# Patient Record
Sex: Male | Born: 1935 | Race: White | Hispanic: No | Smoking: Former smoker
Health system: Southern US, Community
[De-identification: ages and names within clinical notes are randomized; demographics above are authoritative.]

## PROBLEM LIST (undated history)

## (undated) DIAGNOSIS — H3560 Retinal hemorrhage, unspecified eye: Secondary | ICD-10-CM

## (undated) DIAGNOSIS — R609 Edema, unspecified: Secondary | ICD-10-CM

## (undated) DIAGNOSIS — E785 Hyperlipidemia, unspecified: Secondary | ICD-10-CM

## (undated) DIAGNOSIS — H35379 Puckering of macula, unspecified eye: Secondary | ICD-10-CM

## (undated) DIAGNOSIS — R5381 Other malaise: Secondary | ICD-10-CM

## (undated) DIAGNOSIS — I209 Angina pectoris, unspecified: Secondary | ICD-10-CM

## (undated) DIAGNOSIS — C4491 Basal cell carcinoma of skin, unspecified: Secondary | ICD-10-CM

## (undated) DIAGNOSIS — K219 Gastro-esophageal reflux disease without esophagitis: Secondary | ICD-10-CM

## (undated) DIAGNOSIS — I10 Essential (primary) hypertension: Secondary | ICD-10-CM

## (undated) DIAGNOSIS — K4090 Unilateral inguinal hernia, without obstruction or gangrene, not specified as recurrent: Secondary | ICD-10-CM

## (undated) DIAGNOSIS — W19XXXA Unspecified fall, initial encounter: Secondary | ICD-10-CM

## (undated) DIAGNOSIS — I639 Cerebral infarction, unspecified: Secondary | ICD-10-CM

## (undated) DIAGNOSIS — R531 Weakness: Secondary | ICD-10-CM

## (undated) DIAGNOSIS — R0989 Other specified symptoms and signs involving the circulatory and respiratory systems: Secondary | ICD-10-CM

## (undated) DIAGNOSIS — M8580 Other specified disorders of bone density and structure, unspecified site: Secondary | ICD-10-CM

## (undated) DIAGNOSIS — K5730 Diverticulosis of large intestine without perforation or abscess without bleeding: Secondary | ICD-10-CM

## (undated) DIAGNOSIS — M47812 Spondylosis without myelopathy or radiculopathy, cervical region: Secondary | ICD-10-CM

## (undated) DIAGNOSIS — R269 Unspecified abnormalities of gait and mobility: Secondary | ICD-10-CM

## (undated) DIAGNOSIS — R2689 Other abnormalities of gait and mobility: Secondary | ICD-10-CM

## (undated) DIAGNOSIS — K5790 Diverticulosis of intestine, part unspecified, without perforation or abscess without bleeding: Secondary | ICD-10-CM

## (undated) DIAGNOSIS — N400 Enlarged prostate without lower urinary tract symptoms: Secondary | ICD-10-CM

## (undated) DIAGNOSIS — D126 Benign neoplasm of colon, unspecified: Secondary | ICD-10-CM

## (undated) DIAGNOSIS — H10829 Rosacea conjunctivitis, unspecified eye: Secondary | ICD-10-CM

## (undated) DIAGNOSIS — C439 Malignant melanoma of skin, unspecified: Secondary | ICD-10-CM

## (undated) DIAGNOSIS — Z8673 Personal history of transient ischemic attack (TIA), and cerebral infarction without residual deficits: Secondary | ICD-10-CM

## (undated) DIAGNOSIS — I480 Paroxysmal atrial fibrillation: Secondary | ICD-10-CM

## (undated) DIAGNOSIS — IMO0002 Reserved for concepts with insufficient information to code with codable children: Secondary | ICD-10-CM

## (undated) HISTORY — DX: Hyperlipidemia, unspecified: E78.5

## (undated) HISTORY — DX: Other malaise: R53.81

## (undated) HISTORY — DX: Other specified symptoms and signs involving the circulatory and respiratory systems: R09.89

## (undated) HISTORY — DX: Benign prostatic hyperplasia without lower urinary tract symptoms: N40.0

## (undated) HISTORY — DX: Basal cell carcinoma of skin, unspecified: C44.91

## (undated) HISTORY — DX: Benign neoplasm of colon, unspecified: D12.6

## (undated) HISTORY — PX: OTHER SURGICAL HISTORY: SHX169

## (undated) HISTORY — DX: Paroxysmal atrial fibrillation: I48.0

## (undated) HISTORY — DX: Rosacea conjunctivitis, unspecified eye: H10.829

## (undated) HISTORY — DX: Unspecified abnormalities of gait and mobility: R26.9

## (undated) HISTORY — DX: Puckering of macula, unspecified eye: H35.379

## (undated) HISTORY — DX: Personal history of transient ischemic attack (TIA), and cerebral infarction without residual deficits: Z86.73

## (undated) HISTORY — DX: Other abnormalities of gait and mobility: R26.89

## (undated) HISTORY — DX: Essential (primary) hypertension: I10

## (undated) HISTORY — DX: Reserved for concepts with insufficient information to code with codable children: IMO0002

## (undated) HISTORY — PX: EYE SURGERY: SHX253

## (undated) HISTORY — DX: Spondylosis without myelopathy or radiculopathy, cervical region: M47.812

## (undated) HISTORY — DX: Cerebral infarction, unspecified: I63.9

## (undated) HISTORY — DX: Diverticulosis of large intestine without perforation or abscess without bleeding: K57.30

## (undated) HISTORY — PX: PROSTATE BIOPSY: SHX241

## (undated) HISTORY — DX: Weakness: R53.1

## (undated) HISTORY — DX: Retinal hemorrhage, unspecified eye: H35.60

## (undated) HISTORY — DX: Other specified disorders of bone density and structure, unspecified site: M85.80

## (undated) HISTORY — DX: Edema, unspecified: R60.9

## (undated) HISTORY — DX: Diverticulosis of intestine, part unspecified, without perforation or abscess without bleeding: K57.90

## (undated) HISTORY — DX: Unspecified fall, initial encounter: W19.XXXA

## (undated) HISTORY — DX: Unilateral inguinal hernia, without obstruction or gangrene, not specified as recurrent: K40.90

## (undated) HISTORY — DX: Malignant melanoma of skin, unspecified: C43.9

---
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
## 2002-08-31 ENCOUNTER — Encounter: Payer: Self-pay | Admitting: General Surgery

## 2002-08-31 ENCOUNTER — Ambulatory Visit (HOSPITAL_COMMUNITY): Admission: RE | Admit: 2002-08-31 | Discharge: 2002-08-31 | Payer: Self-pay | Admitting: General Surgery

## 2002-09-04 ENCOUNTER — Encounter: Payer: Self-pay | Admitting: General Surgery

## 2002-09-04 ENCOUNTER — Encounter: Admission: RE | Admit: 2002-09-04 | Discharge: 2002-09-04 | Payer: Self-pay | Admitting: General Surgery

## 2002-09-05 ENCOUNTER — Encounter (INDEPENDENT_AMBULATORY_CARE_PROVIDER_SITE_OTHER): Payer: Self-pay | Admitting: *Deleted

## 2002-09-05 ENCOUNTER — Ambulatory Visit (HOSPITAL_BASED_OUTPATIENT_CLINIC_OR_DEPARTMENT_OTHER): Admission: RE | Admit: 2002-09-05 | Discharge: 2002-09-05 | Payer: Self-pay | Admitting: General Surgery

## 2002-09-05 ENCOUNTER — Encounter: Payer: Self-pay | Admitting: General Surgery

## 2002-09-20 ENCOUNTER — Encounter: Admission: RE | Admit: 2002-09-20 | Discharge: 2002-09-20 | Payer: Self-pay | Admitting: Specialist

## 2002-09-20 ENCOUNTER — Encounter: Payer: Self-pay | Admitting: Specialist

## 2005-06-28 DIAGNOSIS — D126 Benign neoplasm of colon, unspecified: Secondary | ICD-10-CM

## 2005-06-28 HISTORY — DX: Benign neoplasm of colon, unspecified: D12.6

## 2005-08-05 ENCOUNTER — Ambulatory Visit: Payer: Self-pay | Admitting: Gastroenterology

## 2005-08-18 ENCOUNTER — Ambulatory Visit: Payer: Self-pay | Admitting: Gastroenterology

## 2005-08-18 ENCOUNTER — Encounter (INDEPENDENT_AMBULATORY_CARE_PROVIDER_SITE_OTHER): Payer: Self-pay | Admitting: Specialist

## 2005-08-26 DIAGNOSIS — I639 Cerebral infarction, unspecified: Secondary | ICD-10-CM

## 2005-08-26 HISTORY — DX: Cerebral infarction, unspecified: I63.9

## 2005-09-20 ENCOUNTER — Inpatient Hospital Stay (HOSPITAL_COMMUNITY): Admission: EM | Admit: 2005-09-20 | Discharge: 2005-09-24 | Payer: Self-pay | Admitting: Emergency Medicine

## 2005-09-21 ENCOUNTER — Encounter (INDEPENDENT_AMBULATORY_CARE_PROVIDER_SITE_OTHER): Payer: Self-pay | Admitting: Cardiology

## 2005-09-22 ENCOUNTER — Ambulatory Visit: Payer: Self-pay | Admitting: Oncology

## 2005-10-22 ENCOUNTER — Ambulatory Visit (HOSPITAL_COMMUNITY): Admission: RE | Admit: 2005-10-22 | Discharge: 2005-10-22 | Payer: Self-pay | Admitting: Internal Medicine

## 2006-06-28 HISTORY — PX: COLONOSCOPY: SHX174

## 2006-09-27 ENCOUNTER — Ambulatory Visit: Payer: Self-pay | Admitting: Gastroenterology

## 2006-10-05 ENCOUNTER — Ambulatory Visit: Payer: Self-pay | Admitting: Gastroenterology

## 2007-12-19 ENCOUNTER — Encounter: Admission: RE | Admit: 2007-12-19 | Discharge: 2007-12-19 | Payer: Self-pay | Admitting: Internal Medicine

## 2008-10-16 ENCOUNTER — Ambulatory Visit: Payer: Self-pay | Admitting: Vascular Surgery

## 2009-09-03 ENCOUNTER — Encounter (INDEPENDENT_AMBULATORY_CARE_PROVIDER_SITE_OTHER): Payer: Self-pay | Admitting: *Deleted

## 2009-09-11 ENCOUNTER — Encounter (INDEPENDENT_AMBULATORY_CARE_PROVIDER_SITE_OTHER): Payer: Self-pay | Admitting: *Deleted

## 2009-10-14 ENCOUNTER — Encounter (INDEPENDENT_AMBULATORY_CARE_PROVIDER_SITE_OTHER): Payer: Self-pay | Admitting: *Deleted

## 2009-10-14 ENCOUNTER — Ambulatory Visit: Payer: Self-pay | Admitting: Gastroenterology

## 2009-10-14 DIAGNOSIS — I6350 Cerebral infarction due to unspecified occlusion or stenosis of unspecified cerebral artery: Secondary | ICD-10-CM | POA: Insufficient documentation

## 2009-10-14 DIAGNOSIS — Z860100 Personal history of colon polyps, unspecified: Secondary | ICD-10-CM | POA: Insufficient documentation

## 2009-10-23 ENCOUNTER — Telehealth: Payer: Self-pay | Admitting: Gastroenterology

## 2009-10-28 ENCOUNTER — Telehealth (INDEPENDENT_AMBULATORY_CARE_PROVIDER_SITE_OTHER): Payer: Self-pay | Admitting: *Deleted

## 2009-10-29 ENCOUNTER — Ambulatory Visit: Payer: Self-pay | Admitting: Gastroenterology

## 2010-07-14 ENCOUNTER — Other Ambulatory Visit: Payer: Self-pay | Admitting: Dermatology

## 2010-07-28 NOTE — Progress Notes (Signed)
Summary: ? re meds  Phone Note Call from Patient Call back at Home Phone [phone]   Caller: Patient Call For: Jarold Motto Reason for Call: Talk to Nurse Summary of Call: Patient has questions regarding meds before his prep Initial call taken by: Tawni Levy,  October 23, 2009 12:31 PM  Follow-up for Phone Call        Pt asks if it is OK to use miralax during the five days prior to colonoscopy.  Pt instructed that it will be OK to cont this. Follow-up by: Ashok Cordia RN,  October 23, 2009 12:41 PM

## 2010-07-28 NOTE — Letter (Signed)
Summary: Colonoscopy Letter  [facility]  [address] [place], [place] [address]   Phone: [phone]  Fax: [phone]      September 03, 2009 MRN: [id number]   Cody [name] [address] [place], [place]  [address]   Dear [name],   According to your medical record, it is time for you to schedule a Colonoscopy. The American Cancer Society recommends this procedure as a method to detect early colon cancer. Patients with a family history of colon cancer, or a personal history of colon polyps or inflammatory bowel disease are at increased risk.  This letter has beeen generated based on the recommendations made at the time of your procedure. If you feel that in your particular situation this may no longer apply, please contact our office.  Please call our office at [phone] to schedule this appointment or to update your records at your earliest convenience.  Thank you for cooperating with [place] to provide you with the very best care possible.   Sincerely,   Vania Rea. Jarold Motto, M.D.  [facility] Gastroenterology Division [phone]

## 2010-07-28 NOTE — Assessment & Plan Note (Signed)
Summary: SCREEN FOR COLON/ON PLAVIX/YF   History of Present Illness Primary GI MD: Sheryn Bison MD FACP FAGA Chief Complaint: Consult colon, pt is on Plavix. Denies any GI sx at this time. History of Present Illness:   77-year-old Caucasian male with previous CVA and is on chronic Plavix therapy. 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 He currently is asymptomatic in terms of any GI complaints. He has regular bowel movements without melena or hematochezia. His appetite is good and his weight is stable. He is followed by Dr. Jarome Matin  for primary care.He denies any current cardiovascular or pulmonary complaints.   GI Review of Systems      Denies abdominal pain, acid reflux, belching, bloating, chest pain, dysphagia with liquids, dysphagia with solids, heartburn, loss of appetite, nausea, vomiting, vomiting blood, weight loss, and  weight gain.        Denies anal fissure, black tarry stools, change in bowel habit, constipation, diarrhea, diverticulosis, fecal incontinence, heme positive stool, hemorrhoids, irritable bowel syndrome, jaundice, light color stool, liver problems, rectal bleeding, and  rectal pain. Preventive Screening-Counseling & Management  Alcohol-Tobacco     Smoking Status: quit      Drug Use:  no.      Current Medications (verified): 1)  Plavix 75 Mg Tabs (Clopidogrel Bisulfate) .... One Tablet By Mouth Once Daily 2)  Lisinopril 10 Mg Tabs (Lisinopril) .... One Tablet By Mouth Once Daily 3)  Digoxin 0.25 Mg Tabs (Digoxin) .... One Tablet By Mouth Once Daily 4)  Carvedilol 3.125 Mg Tabs (Carvedilol) .... One Tablet By Mouth Two Times A Day 5)  Pravastatin Sodium 20 Mg Tabs (Pravastatin Sodium) .... One Tablet By Mouth Once Daily 6)  Tamsulosin Hcl 0.4 Mg Caps (Tamsulosin Hcl) .... One Tablet By Mouth Once Daily 7)  Avodart 0.5 Mg Caps (Dutasteride) .... One  Capsule By Mouth Once Daily 8)  Hydrochlorothiazide 25 Mg Tabs (Hydrochlorothiazide) .... 1/2 Tablets By Mouth Once Daily  Allergies (verified): 1)  ! Aspirin  Past History:  Past medical, surgical, family and social histories (including risk factors) reviewed for relevance to current acute and chronic problems.  Past Medical History: Arrhythmia Adenomatous Colon Polyps Hypertension Stroke  Past Surgical History: Tonsillectomy  Family History: Reviewed history and no changes required. Family History of Liver Cancer:Father Family History of Pancreatic Cancer:Sister  Social History: Reviewed history and no changes required. Divorced Retired Patient is a former smoker.  Alcohol Use - yes Illicit Drug Use - no Smoking Status:  quit Drug Use:  no  Review of Systems       The patient complains of swelling of feet/legs.  The patient denies allergy/sinus, anemia, anxiety-new, arthritis/joint pain, back pain, blood in urine, breast changes/lumps, change in vision, confusion, cough, coughing up blood, depression-new, fainting, fatigue, fever, headaches-new, hearing problems, heart murmur, heart rhythm changes, itching, menstrual pain, muscle pains/cramps, night sweats, nosebleeds, pregnancy symptoms, shortness of breath, skin rash, sleeping problems, sore throat, swollen lymph glands, thirst - excessive , urination - excessive , urination changes/pain, urine leakage, vision changes, and voice change.    Vital Signs:  Patient profile:   76 year old male Height:      74 inches Weight:      152.50 pounds BMI:     19.65 Pulse rate:   68 / minute Pulse rhythm:   regular BP sitting:   128 / 68  (  left arm) Cuff size:   regular  Vitals Entered By: Christie Nottingham CMA Duncan Dull) (October 14, 2009 2:49 PM)  Physical Exam  General:  Well developed, well nourished, no acute distress.tall statured.   Head:  Normocephalic and atraumatic. Eyes:  PERRLA, no icterus.exam deferred to patient's  ophthalmologist.   Lungs:  Clear throughout to auscultation. Heart:  Regular rate and rhythm; no murmurs, rubs,  or bruits. Abdomen:  Soft, nontender and nondistended. No masses, hepatosplenomegaly or hernias noted. Normal bowel sounds. Msk:  Symmetrical with no gross deformities. Normal posture.Multiple ecchymoses on his upper extremities noted. Extremities:  No clubbing, cyanosis, edema or deformities noted. Neurologic:  Alert and  oriented x4;  grossly normal neurologically. Psych:  Alert and cooperative. Normal mood and affect.   Impression & Recommendations:  Problem # 1:  PERSONAL HX COLONIC POLYPS (ICD-V12.72) Assessment Unchanged Colonoscopy on Plavix therapy scheduled at his convenience. The risk and benefits of increased bleeding associated with Plavix use has been explained and reviewed. I do not think he is a candidate, off of his anticoagulation therapy, and he tolerated his previous polypectomies without difficulty while on Plavix.  Problem # 2:  CVA (ICD-434.91) Assessment: Improved Continue all other medications per primary care.  Patient Instructions: 1)  You are scheuled for a follow up colonoscopy. 2)  You can remain on your Plavix. 3)  The medication list was reviewed and reconciled.  All changed / newly prescribed medications were explained.  A complete medication list was provided to the patient / caregiver. 4)  Copy sent to : Dr. Jarome Matin 5)  Please continue current medications.  6)  Colonoscopy and Flexible Sigmoidoscopy brochure given.  7)  Conscious Sedation brochure given.   Appended Document: SCREEN FOR COLON/ON PLAVIX/YF    Clinical Lists Changes  Medications: Added new medication of MOVIPREP 100 GM  SOLR (PEG-KCL-NACL-NASULF-NA ASC-C) As per prep instructions. - Signed Rx of MOVIPREP 100 GM  SOLR (PEG-KCL-NACL-NASULF-NA ASC-C) As per prep instructions.;  #1 x 0;  Signed;  Entered by: Ashok Cordia RN;  Authorized by: Mardella Layman MD [facility];   Method used: Electronically to [facility]*, [address] [place], [place]  [id number], Ph: [phone], Fax: [phone] Orders: Added new Test order of Colonoscopy (Colon) - Signed    Prescriptions: MOVIPREP 100 GM  SOLR (PEG-KCL-NACL-NASULF-NA ASC-C) As per prep instructions.  #1 x 0   Entered by:   Ashok Cordia RN   Authorized by:   Mardella Layman MD [facility]   Signed by:   Ashok Cordia RN on 10/14/2009   Method used:   Electronically to        [facility]* (retail)       [address]       [place], [place]  [id number]       Ph: [phone]       Fax: [phone]   RxID:   [id number]

## 2010-07-28 NOTE — Letter (Signed)
Summary: New Patient letter  [facility]  [address] [place], [place] [address]   Phone: [phone]  Fax: [phone]       09/11/2009 MRN: [id number]  Cody [name] [address] [place], [place]  [address]  Dear Mr. [facility],  Welcome to the Gastroenterology Division at [facility].    You are scheduled to see Dr.  Jarold Motto on 10-14-09 at 2:45pm on the 3rd floor at [facility], [address]  We ask that you try to arrive at our office 15 minutes prior to your appointment time to allow for check-in.  We would like you to complete the enclosed self-administered evaluation form prior to your visit and bring it with you on the day of your appointment.  We will review it with you.  Also, please bring a complete list of all your medications or, if you prefer, bring the medication bottles and we will list them.  Please bring your insurance card so that we may make a copy of it.  If your insurance requires a referral to see a specialist, please bring your referral form from your primary care physician.  Co-payments are due at the time of your visit and may be paid by cash, check or credit card.     Your office visit will consist of a consult with your physician (includes a physical exam), any laboratory testing he/she may order, scheduling of any necessary diagnostic testing (e.g. x-ray, ultrasound, CT-scan), and scheduling of a procedure (e.g. Endoscopy, Colonoscopy) if required.  Please allow enough time on your schedule to allow for any/all of these possibilities.    If you cannot keep your appointment, please call [phone] to cancel or reschedule prior to your appointment date.  This allows [place] the opportunity to schedule an appointment for another patient in need of care.  If you do not cancel or reschedule by 5 p.m. the business day prior to your appointment date, you will be charged a $50.00 late cancellation/no-show fee.    Thank you for choosing  [facility] for your medical needs.  We appreciate the opportunity to care for you.  Please visit [place] at our website  to learn more about our practice.                     Sincerely,                                                             The Gastroenterology Division

## 2010-07-28 NOTE — Letter (Signed)
Summary: [facility] Instructions  [facility]  [address] [place], [place] [address]   Phone: [phone]  Fax: [phone]       Cody [name]    January 07, 1925    MRN: [id number]        Procedure Day Dorna Bloom:  Wednedsay, 10/29/09     Arrival Time: 12:30      Procedure Time: 1:30     Location of Procedure:                    _X _  [facility] (4th Floor)                        PREPARATION FOR COLONOSCOPY WITH MOVIPREP   Starting 5 days prior to your procedure 10/24/09 do not eat nuts, seeds, popcorn, corn, beans, peas,  salads, or any raw vegetables.  Do not take any fiber supplements (e.g. Metamucil, Citrucel, and Benefiber).  THE DAY BEFORE YOUR PROCEDURE         DATE: 10/28/09   DAY: Tuesday  1.  Drink clear liquids the entire day-NO SOLID FOOD  2.  Do not drink anything colored red or purple.  Avoid juices with pulp.  No orange juice.  3.  Drink at least 64 oz. (8 glasses) of fluid/clear liquids during the day to prevent dehydration and help the prep work efficiently.  CLEAR LIQUIDS INCLUDE: Water Jello Ice Popsicles Tea (sugar ok, no milk/cream) Powdered fruit flavored drinks Coffee (sugar ok, no milk/cream) Gatorade Juice: apple, white grape, white cranberry  Lemonade Clear bullion, consomm, broth Carbonated beverages (any kind) Strained chicken noodle soup Hard Candy                             4.  In the morning, mix first dose of MoviPrep solution:    Empty 1 Pouch A and 1 Pouch B into the disposable container    Add lukewarm drinking water to the top line of the container. Mix to dissolve    Refrigerate (mixed solution should be used within 24 hrs)  5.  Begin drinking the prep at 5:00 p.m. The MoviPrep container is divided by 4 marks.   Every 15 minutes drink the solution down to the next mark (approximately 8 oz) until the full liter is complete.   6.  Follow completed prep with 16 oz of clear liquid of your choice (Nothing red  or purple).  Continue to drink clear liquids until bedtime.  7.  Before going to bed, mix second dose of MoviPrep solution:    Empty 1 Pouch A and 1 Pouch B into the disposable container    Add lukewarm drinking water to the top line of the container. Mix to dissolve    Refrigerate  THE DAY OF YOUR PROCEDURE      DATE: 10/29/09   DAY: Wednesday  Beginning at 8:30 a.m. (5 hours before procedure):         1. Every 15 minutes, drink the solution down to the next mark (approx 8 oz) until the full liter is complete.  2. Follow completed prep with 16 oz. of clear liquid of your choice.    3. You may drink clear liquids until 11:30  (2 HOURS BEFORE PROCEDURE).   MEDICATION INSTRUCTIONS  Unless otherwise instructed, you should take regular prescription medications with a small sip of water   as early as possible  the morning of your procedure.   You May remain on your Plavix.              OTHER INSTRUCTIONS  You will need a responsible adult at least 74 years of age to accompany you and drive you home.   This person must remain in the waiting room during your procedure.  Wear loose fitting clothing that is easily removed.  Leave jewelry and other valuables at home.  However, you may wish to bring a book to read or  an iPod/MP3 player to listen to music as you wait for your procedure to start.  Remove all body piercing jewelry and leave at home.  Total time from sign-in until discharge is approximately 2-3 hours.  You should go home directly after your procedure and rest.  You can resume normal activities the  day after your procedure.  The day of your procedure you should not:   Drive   Make legal decisions   Operate machinery   Drink alcohol   Return to work  You will receive specific instructions about eating, activities and medications before you leave.    The above instructions have been reviewed and explained to me by   _______________________    I  fully understand and can verbalize these instructions _____________________________ Date _________

## 2010-07-28 NOTE — Procedures (Signed)
Summary: Colonoscopy  Patient: Cody [name] Note: All result statuses are Final unless otherwise noted.  Tests: (1) Colonoscopy (COL)   COL Colonoscopy           DONE     [facility]     [address]     [place], [place]  [address]           COLONOSCOPY PROCEDURE REPORT           PATIENT:  [name], [name]  MR#:  [id number]     BIRTHDATE:  [birth year], 84 yrs. old  GENDER:  male     ENDOSCOPIST:  Vania Rea. Jarold Motto, MD, [facility]     REF. BY:     PROCEDURE DATE:  10/29/2009     PROCEDURE:  Surveillance Colonoscopy     ASA CLASS:  Class II     INDICATIONS:  history of polyps     MEDICATIONS:   Fentanyl 25 mcg IV, Versed 3 mg IV           DESCRIPTION OF PROCEDURE:   After the risks benefits and     alternatives of the procedure were thoroughly explained, informed     consent was obtained.  Digital rectal exam was performed and     revealed no abnormalities.   The LB CF-H180AL [id number] endoscope     was introduced through the anus and advanced to the cecum, which     was identified by both the appendix and ileocecal valve, without     limitations.  The quality of the prep was excellent, using     MoviPrep.  The instrument was then slowly withdrawn as the colon     was fully examined.     <<PROCEDUREIMAGES>>           FINDINGS:  Moderate diverticulosis was found in the sigmoid to     descending colon segments.   Retroflexed views in the rectum     revealed no abnormalities.    The scope was then withdrawn from     the patient and the procedure completed.           COMPLICATIONS:  None     ENDOSCOPIC IMPRESSION:     1) Moderate diverticulosis in the sigmoid to descending colon     segments     2) Normal colonoscopy otherwise     3) No polyps or cancers     RECOMMENDATIONS:     1) high fiber diet     no need for routine colonoscopy followup here per age,co-morbid     medical problems, and today's negative exam.     REPEAT EXAM:  No           ______________________________     Vania Rea. Jarold Motto, MD, [facility]           CC:  Jarome Matin, MD           n.     Rosalie Doctor:   Vania Rea. Patterson at 10/29/2009 02:04 PM           Madlyn Frankel, [phone]  Note: An exclamation mark (!) indicates a result that was not dispersed into the flowsheet. Document Creation Date: 10/29/2009 2:05 PM _______________________________________________________________________  (1) Order result status: Final Collection or observation date-time: 10/29/2009 13:55 Requested date-time:  Receipt date-time:  Reported date-time:  Referring Physician:   Ordering Physician: Sheryn Bison [phone]) Specimen Source:  Source: Launa Grill Order Number: [id number] Lab site:   Appended Document: Colonoscopy

## 2010-07-28 NOTE — Progress Notes (Signed)
Summary: Prep question re; meds  Pt. had questions re:  Taking his Carvedilol on an empty stomach.  Referred him to his physician that ordered it for him.

## 2010-07-28 NOTE — Procedures (Signed)
Summary: Colon   Colonoscopy  Procedure date:  10/05/2006  Findings:      Location:  [facility].    Colonoscopy  Procedure date:  10/05/2006  Findings:      Location:  [facility].   Patient Name: Cody [name], Cody [name]. MRN:  Procedure Procedures: Colonoscopy CPT: [phone].    with polypectomy. CPT: [id number].  Personnel: Endoscopist: Vania Rea. Jarold Motto, MD.  Exam Location: Exam performed in Outpatient Clinic. Outpatient  Patient Consent: Procedure, Alternatives, Risks and Benefits discussed, consent obtained, from patient. Consent was obtained by the RN.  Indications  Surveillance of: Adenomatous Polyp(s). Initial polypectomy was performed in 2007. in Feb. 1-2 Polyps were found at Index Exam. Largest polyp removed was 10 to 19 mm. Prior polyp located in distal colon. Pathology of worst  polyp: high-grade dysplasia.  History  Current Medications: Patient is taking an non-steroidal medication. Patient is on an anticoagulant. Patient is not currently taking Coumadin. Antiplatelet: Plavix (Clopidrogel) 75 mg, QAM,  Medical/ Surgical History: TIAs, Hypertension, Benign Prostatic Hypertrophy,  Pre-Exam Physical: Performed Aug 18, 2005. Cardio-pulmonary exam, Rectal exam, Abdominal exam, Extremity exam, Mental status exam WNL.  Comments: Pt. history reviewed/updated, physical exam performed prior to initiation of sedation? yes Exam Exam: Extent of exam reached: Cecum, extent intended: Cecum.  The cecum was identified by appendiceal orifice and IC valve. Patient position: on left side. Time to Cecum: 00:05:26. Time for Withdrawl: 00:09:47. Colon retroflexion performed. Images taken. ASA Classification: II. Tolerance: excellent.  Monitoring: Pulse and BP monitoring, Oximetry used. Supplemental O2 given. at 2 Liters.  Colon Prep Used Golytely for colon prep. Prep results: good.  Sedation Meds: Patient assessed and found to be appropriate for  moderate (conscious) sedation. Fentanyl 50 mcg. given IV. Versed 7 mg. given IV.  Instrument(s): CF 140L. Serial [id number].  Findings - DIVERTICULOSIS: Descending Colon to Sigmoid Colon. Not bleeding. ICD9: Diverticulosis: 562.10.  - POLYP: Descending Colon, Maximum size: 4 mm. sessile polyp. Procedure:  snare with cautery, removed, not retrieved, ICD9: Colon Polyps: 211.3. Comments: No tissue left after cautery applied.  - NORMAL EXAM: Cecum to Rectum. Not Seen: Polyps. AVM's. Colitis. Tumors. Melanosis. Crohn's. Hemorrhoids.   Assessment  Diagnoses: 211.3: Colon Polyps.  562.10: Diverticulosis.   Events  Unplanned Interventions: No intervention was required.  Plans Medication Plan: Referring provider to order medications.  Patient Education: Patient given standard instructions for: Polyps. Diverticulosis. Patient instructed to get routine colonoscopy every 3 years.  Disposition: After procedure patient sent to recovery. After recovery patient sent home.  Scheduling/Referral: Follow-Up prn.    cc; Barry Dienes. Jarold Motto, MD    This report was created from the original endoscopy report, which was reviewed and signed by the above listed endoscopist.

## 2010-11-10 NOTE — Assessment & Plan Note (Signed)
OFFICE VISIT   RANDEL, HARGENS  DOB:  1925-04-28                                       10/16/2008  CHART#:[id number]   HISTORY OF PRESENT ILLNESS:  Mr. Clayson is an 77 year old male referred  by Dr. Brunilda Payor for evaluation of foot and ankle swelling.  The  patient states that for the past year he has noted he has had some  swelling of his feet and ankles bilaterally.  He has no pain associated  with this.  He was wondering whether or not this might have been related  previously to his carvedilol.  He has no prior history of DVT.  He has  no history of varicose veins.  He has no claudication symptoms.   His primary atherosclerotic risk factors include hypertension and  elevated cholesterol as well as age.   PAST SURGICAL HISTORY:  None.   MEDICATIONS:  Digoxin 0.25 mg once a day, lisinopril 10 mg once a day,  Plavix 75 mg once a day, carvedilol 3.125 mg twice a day, pravastatin 20  mg once a day, __________ 0.4 mg once a day, hydrochlorothiazide 25 mg  once a day, vitamin D once a day, calcium once a day, saw palmetto once  a day, fluorouracil  5% cream.   He has no known drug allergies.   FAMILY HISTORY:  Unremarkable.   SOCIAL HISTORY:  He is divorced, has 2 children.  He is a retired  Medical illustrator.  He is a former smoker but quit 1975.  He drinks 3-4 ounces of  alcohol daily.   REVIEW OF SYSTEMS:  CARDIAC:  He thinks he may have had a heart murmur  in the past and may have a history of an irregular heartbeat.  He has  urinary frequency.  VASCULAR:  Had a TIA several years ago.  PULMONARY, GI, NEUROLOGIC, ORTHOPEDIC, PSYCHIATRIC, ENT AND HEMATOLOGIC  review of systems are all negative.   PHYSICAL EXAMINATION:  Blood pressure is 175/75 in the right arm, pulse  is 63 and regular.  HEENT:  Unremarkable.  Neck has 2+ carotid pulses  without bruit.  Chest is clear to auscultation.  Cardiac exam is regular  rate rhythm without murmur.  Abdomen is soft,  nontender, nondistended.  No masses.  Extremities:  He has 2+ radial, 2+ femoral pulses  bilaterally.  He has 1+ popliteal pulses bilaterally.  He has a 1+ right  dorsalis pedis pulse.  He has a 1+ left posterior tibial pulse. He has  trace edema of the ankle and feet bilaterally.   He had bilateral ABIs performed today which were 1.32 on the right, 1.81  on the left.  He had biphasic waveforms bilaterally.  Toe pressure was  99 on the right and 100 on the left.   I had a lengthy discussion with Mr. Georg today about the differential  diagnosis of leg swelling.  He does not seem to have any known history  of liver or renal disease.  I believe these would be less likely.  He  may have some mild venous insufficiency but this is not significant  overall in his pattern of swelling which is continuous rather than the  end of the day and with standing, is not really consistent with venous  disease.  I believe he probably has some component of mild cardiac  dysfunction  which may be contributing to this.  Options for him would  include compression stockings, but he did not feel his symptoms are  severe enough to require these at this point.  He mainly wanted  reassurance that something serious is not going on.  I encouraged him to  continue to follow up with Dr. Jarold Motto for treatment of his cardiac  dysfunction.  He did wonder whether or not his medications may have  caused the swelling.  I reassured him that since the swelling is fairly  mild in nature that I would not consider this a major side effects from  medications but that the medications he is taking are also sometimes  prescribed for congestive failure which can produce ankle and foot  swelling.  He will follow up on an as-needed basis.   Janetta Hora. Fields, MD  Electronically Signed   CEF/MEDQ  D:  10/16/2008  T:  10/17/2008  Job:  [id number]

## 2010-11-13 NOTE — Assessment & Plan Note (Signed)
 HEALTHCARE                         GASTROENTEROLOGY OFFICE NOTE   JABRIL, PURSELL                        MRN:          [id number]  DATE:09/27/2006                            DOB:          10-16-1924    Mr. Cody [name] is a 77 year old white male who had colonoscopy and  polypectomy 1 year ago.  He has carcinoma in situ and a 15 mm polyp  removed from the mid sigmoid colon.  I recommended that he have followup  colonoscopy exam.   1. Since he was last seen, he apparently has had a TIA and is on      Plavix 75 mg a day.  2. Along with Lanoxin 0.25 mg a day.  3. Metoprolol daily.  4. Lisinopril daily.  5. Lovastatin 20 mg a day.  6. Saw palmetto.   He is followed by Dr. Barry Dienes. Cody [name] and is not on Coumadin.  He has  had a previous urticarial reaction to aspirin products.   He currently denies any GI complaints and is having regular bowel  movements without melena or hematochezia.  His appetite is good and his  weight is stable.  He denies active cardiovascular or pulmonary  complaints.  He does not smoke or abuse ethanol.  He is retired from  Field seismologist and has a bachelor's degree.   EXAM:  Shows him to be a healthy-appearing white male appearing his  stated age in no acute distress.  He is 6 feet 2 inches tall and weighs 161 pounds.  Blood pressure is  132/92 and pulse was 72 and regular.  Could not appreciate stigmata of chronic liver disease.  Chest was clear and he appeared to be in a regular rhythm without  significant murmurs, gallops, or rubs at this time.  I could not appreciate hepatosplenomegaly, abdominal masses, or  tenderness.  Mental status was clear.  Peripheral extremities were unremarkable.   ASSESSMENT:  Cody [name] does need followup colonoscopy and should be okay  to have this done while on Plavix therapy since he had a negative  colonoscopy otherwise a year ago.  I think the risk of stopping Plavix  outweighs  the risk of colonoscopy and possible polypectomy.   RECOMMENDATIONS:  I have had a long discussion with Cody [name] and will  send this letter to Dr. Eloise [name].  The patient will proceed with  colonoscopy while on Plavix therapy with  appropriate biopsies or polypectomy if indicated.  He otherwise can  continue all of his medications as outlined above.     Vania Rea. Jarold Motto, MD, Caleen Essex, FAGA  Electronically Signed    Cody [name]/MedQ  DD: 09/27/2006  DT: 09/27/2006  Job #: [id number]   cc:   Barry Dienes. Cody [name], M.D.

## 2010-11-13 NOTE — Discharge Summary (Signed)
NAME:  Cody [name], Cody [name] NO.:  [id number]   MEDICAL RECORD NO.:  [id number]          PATIENT TYPE:  INP   LOCATION:  1429                         FACILITY[facility]  [facility]   PHYSICIAN:  Barry Dienes. Eloise Harman, M.D.DATE OF BIRTH:  08-Feb-1925   DATE OF ADMISSION:  09/20/2005  DATE OF DISCHARGE:  09/24/2005                                 DISCHARGE SUMMARY   PERTINENT FINDINGS:  The patient is a 77 year old white male who is well-  known to me.  He was in his usual state of excellent health until  approximately 0630 hours on the morning of admission when he awoke with a  great deal of difficulty getting out of bed due to a persistent sense of  being off balance and not being able to control his right lower extremity.  He did not have vertigo.  He noted that he could use his arms well and had  no difficulty with speech.  That morning he noted moderate unsteadiness  walking.  He also noted less than 3-4 minutes of transient palpitations, but  none since arrival to the hospital.  He had not had any chest pain, vision  changes, headaches, or difficulty swallowing food.   INITIAL PHYSICAL EXAMINATION:  VITAL SIGNS:  Blood pressure 156/75, pulse  52, respirations 20, temperature 99.2, pulse oxygen saturation 97% on room  air.  GENERAL:  In general he was an elderly white male who was in no apparent  distress while sitting up in bed.  HEENT:  Exam was within normal limits.  NECK:  Supple and without jugular venous distension.  There was a soft left-  sided carotid bruit.  CHEST:  Clear to auscultation.  HEART:  Had a regular rate and rhythm without significant murmur or gallop.  ABDOMEN:  Had normal bowel sounds and no hepatosplenomegaly or tenderness.  EXTREMITIES:  Without cyanosis, clubbing, or edema and there were normal  bilateral pedal pulses.  NEUROLOGIC EXAM:  He was alert and oriented x4, cranial nerves II-XII were  normal, sensory exam was grossly normal, motor exam was  grossly normal.  Cerebellar finger-to-nose testing was significant for pass pointing on the  right and was normal on the left.  Gait:  He had mildly wide base gait and  had to balance by touching furniture. A Romberg test was normal.   INITIAL LABORATORY STUDIES:  White blood cell count 4.2, hemoglobin 13,  hematocrit 41, platelets 173.  Serum sodium 139, potassium 3.8, chloride  106, carbon dioxide 27, BUN 22, creatinine 1.1, glucose 111, troponin I less  than 0.05, alkaline phosphatase 53, digoxin 0.3 cm.   HOSPITAL COURSE:  The patient was admitted to a medical bed with telemetry.  The telemetry throughout his hospital stay did not show any significant  arrhythmia.  He had an extensive workup of his neurologic deficits that  included a March 26 CT scan of the head without contrast that showed no  acute abnormalities, a March 26 MRI scan of the brain showed mild soft  tissue swelling surrounding the __________suggesting pannus which could be  due to rheumatoid or  psoriatic arthritis, low-level positivity in the left  internal capsule and near the head of the caudate that was felt consistent  with subacute infarct 66-30 weeks of age.  Metastatic disease to the brain  could not be excluded without contrast.  On March 26, he also had a MR  angiography of the head without contrast that showed carotid and basilar  arteries widely patent with no areas of stenosis or occlusion.  No  intracranial aneurysm was seen.  The distal aspects of MCA and PCA branches  were relatively unremarkable.  On March 27, he had an MRI of the brain with  contrast that showed two focal areas of enhancement.  One was adjacent to  the right caudate head measuring 8 mm in maximal dimension, the other was in  the right frontal lobe along the precentral sulcus measuring 5 mm in maximal  dimension.  These were suggestive of metastatic disease to the brain.  On  March 28, a CT scan of the chest, abdomen, and pelvis with  contrast showed  diffuse calcifications of the aorta with high-grade stenosis of the left  subclavian artery proximal to the origin of the left vertebral artery.  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  There was no evidence of intrathoracic metastatic disease.  There  was no evidence of intra-abdominal metastatic disease and a question of  possible gastric varices.  The pelvic CT scan was notable for a markedly  enlarged prostate gland measuring 6.0 cm x 6.2 cm.  In addition, there was a  small right-sided bladder Hutch diverticulum and a small inguinal hernia on  the right containing only fat.  There was no evidence of pelvic metastatic  disease.   The patient was also seen by a consultant from oncology, Dr. Eli Hose  who recommended checking a LDH to check the positivity index marker and also  recommended the CT scans of the chest, abdomen, and pelvis, feeling that  metastases to the brain would be unlikely without evidence of other  metastases.  A neurology consultant, Dr. Jacki Cones, also saw the patient  and felt that treatment with Plavix was appropriate given his history of  aspirin allergy and that a repeat MRI of the brain with contrast in 3-4  weeks was indicated to help his distinguish ischemia from tumor.  He felt  that the symptoms and findings were consistent with a left brain stroke.   An echocardiogram was also done that showed normal left ventricular systolic  function with mild mitral valve prolapse without mitral regurgitation and no  intracardiac source of emboli.   COMPLICATIONS:  None.   CONDITION ON DISCHARGE:  He has had no further TIAs, no palpitations, no  shortness of breath, no difficulty swallowing.   PHYSICAL EXAMINATION:  VITAL SIGNS:  Most recent vital signs include blood pressure 150/70, pulse 62, respirations 18,  temperature 97.6, pulse oxygen  saturation 99% on room air.  GENERAL:  In general he is a tall, thin, white male who is in no apparent  distress.  CHEST:  Chest was clear to auscultation.  HEART:  Heart had a regular rate and rhythm.  ABDOMEN:  Abdomen was benign.  NEUROLOGICAL EXAM:  He was alert and oriented x4 with cranial nerves II-XII  normal.  He had mildly impaired diadochokinesis on the right.  He had a  somewhat wide-based gait but was able to independently slowly walk in the  room and turn around and sit himself down.   LDH level was 179   DISCHARGE DIAGNOSES:  1.  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  2.  Hypertension, essential, controlled.  3.  Benign prostatic hypertrophy with history of prostate biopsies in March      2005 negative.  4.  Diverticulosis.  5.  Left subclavian stenosis.  6.  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  7.  Osteopenia.  8.  Atypical chest pain with septal Q-waves leading to September 2006      adenosine Cardiolite test not showing ischemia.   DISCHARGE MEDICATIONS:  1.  Lisinopril 5 mg p.o. daily.  2.  Atenolol 25 mg p.o. daily.  3.  Plavix 75 mg p.o. daily.  4.  Saw palmetto twice daily.   DISPOSITION AND FOLLOWUP:  The patient will be discharged to home.  He will  have a walker and a cane upon discharge.  He will be given the option of  home health physical therapy upon discharge.  He will be asked to schedule a  follow-up appointment with Dr. Eloise Harman in 2 weeks following discharge and  with Dr. Jacki Cones in approximately 4 weeks following discharge and was  given telephone numbers to call for those appointments.  He was advised to  call Dr. Eloise Harman if his ability to walk worsens or if he has any  significant change in his condition.           ______________________________  Barry Dienes Eloise Harman, M.D.     DGP/MEDQ  D:  09/24/2005  T:   09/27/2005  Job:  [id number]   cc:   Casimiro Needle L. Thad Ranger, M.D.  Fax: [phone]   Blenda Nicely. [employer]

## 2010-11-13 NOTE — Op Note (Signed)
   NAME:  WENTWORTH, EDELEN                           ACCOUNT NO.:  [id number]   MEDICAL RECORD NO.:  [id number]                   PATIENT TYPE:  AMB   LOCATION:  DSC                                  FACILITY:  [facility]   PHYSICIAN:  Rose Phi. Maple Hudson, M.D.                DATE OF BIRTH:  [birth year]   DATE OF PROCEDURE:  09/05/2002  DATE OF DISCHARGE:                                 OPERATIVE REPORT   PREOPERATIVE DIAGNOSIS:  Melanoma of the left forearm, 1.4 mm thick.   POSTOPERATIVE DIAGNOSIS:  Melanoma of the left forearm, 1.4 mm thick.   PROCEDURES:  1. Blue dye injection.  2. Left axillary sentinel lymph node biopsy.  3. Wide excision of melanoma with primary closure of the left forearm.   SURGEON:  Rose Phi. Maple Hudson, M.D.   ANESTHESIA:  General.   DESCRIPTION OF PROCEDURE:  Prior to coming to the operating room, Technetium  sulfur colloid had been injected intradermally around the melanoma on the  left forearm.  After suitable general anesthesia was induced, we then  injected a small amount of Lymphazurin blue in this area and massaged it for  a couple of minutes.  We then prepped and draped the hand, arm, and axilla.   Careful scanning of the axilla revealed a hot spot, and a short transverse  axillary incision was made with dissection to the clavipectoral fascia.  Then with careful use of the Neoprobe, I was able to identify a hot and blue  lymph node, which we excised.  There were no other hot spots or blue spots  or palpable nodes.  These were removed as a sentinel node and the incision  closed with 3-0 Vicryl and subcuticular 4-0 Monocryl and Steri-Strips.   I then turned my attention to the dorsum of the left forearm where his  melanoma had been biopsied.  A 1 cm margin was marked around it and then an  elliptical incision carried out in a longitudinal orientation. I excised  that and then this gave a defect of 5.5 x 2.5 cm.   A primary closure of interrupted 4-0 nylon was  carried out.  Dressings were  then applied and the patient transferred to the recovery room in  satisfactory condition, having tolerated the procedure well.                                               Rose Phi. Maple Hudson, M.D.    PRY/MEDQ  D:  09/05/2002  T:  09/05/2002  Job:  [id number]   cc:   Barry Dienes. Eloise Harman, M.D.  [address]  [place]  [place] [address]  Fax: [phone]   Dorinda Hill, M.D.

## 2010-11-13 NOTE — Consult Note (Signed)
NAME:  Cody [name], Cody [name] NO.:  [id number]   MEDICAL RECORD NO.:  [id number]          PATIENT TYPE:  INP   LOCATION:  1429                         [facility]ACILITY:  [facility]   PHYSICIAN:  Blenda Nicely. Shadad        DATE OF BIRTH:  Jun 27, 1925   DATE OF CONSULTATION:  09/22/2005  DATE OF DISCHARGE:                                   CONSULTATION   CONSULTING PHYSICIAN:  Dr. Blenda Nicely. Shadad.   REFERRING PHYSICIAN:  Barry Dienes. Eloise Harman, M.D.   REASON FOR CONSULTATION:  Brain lesions.   HISTORY OF PRESENT ILLNESS:  Cody [name] is a pleasant 77 year old white male  admitted, on September 20, 2005, with gait disturbance, motor difficulties, and  dizziness requiring further workup including a CT of the head to rule out  stroke.  The CT of the head was not suspicious for metastasis.  However due  to his history of melanoma in the past, an MRI of the brain was performed,  on September 20, 2005, to further investigate.  This showed a low level  positivity deep white matter and basal ganglia on the left consistent with  subacute infarct, soft tissue density surrounding the dens but no aneurysm.  A CT of the chest, abdomen, and pelvis are currently pending.  Dr. Eloise Harman  asked [place] to see the patient in order to rule out the possibility of  melanoma.  No tissue diagnosis is available for review.  Will follow.   PAST MEDICAL HISTORY:  1.  Benign prostatic hypertrophy with persistently elevated PSA, negative      biopsy, March 2005.  2.  Hypertension.  3.  Diverticulosis.  4.  Mild right ICA stenosis.  5.  History of melanoma of the left forearm lesion, status post resection in      March 2004, with normal axillary node biopsy, Clark III, T2a N0 (1-)      (SN) MX.  6.  Osteopenia.  Last bone density in January 2006.  7.  Remote history of tobacco use.  8.  Dysrhythmia, currently with bradycardia.   SURGERY:  1.  Status post left forearm melanoma resection for a 1.4-mm thick wide      local  excision, negative sentinel lymph node biopsy in March 2004, Dr.      Maple Hudson.  2.  Status post prostatic gland biopsy, negative for malignancy, March 2005.  3.  Status post tonsillectomy as a child.   ALLERGIES:  1.  ASPIRIN.  2.  EGGS.  3.  TETANUS.   CURRENT MEDICATIONS:  1.  Tenormin 25 mg every day.  2.  Plavix 75 mg every day.  3.  Prinivil 5 mg every day.  4.  IV fluids normal saline at 50 cc/hr.   REVIEW OF SYSTEMS:  Remarkable for fatigue, intermittent headaches - now  controlled, slight gait instability, mild right facial droop.  The rest of  the review of systems is negative.   FAMILY HISTORY:  Mother died at 75 with metastatic breast cancer to the  lung.  Father died at 70 with MI.  One  sister died at 71 with pancreatic  cancer.  And, one brother alive and well.   SOCIAL HISTORY:  The patient is divorced.  He has one daughter, 39, in [place]; one son 73 in [place], and three grandchildren.  He is a retired  Radiation protection practitioner.  He quit 30 years ago the use of cigarettes.  No alcohol  history.   PHYSICAL EXAMINATION:  GENERAL:  This is a thin, well developed, 80-year-  old, white male in no acute distress, very anxious, alert and oriented x3.  VITAL SIGNS:  Blood pressure 151/62, pulse 54, respirations 18, temperature  98, pulse oximeter 95% on room air, weight 155 pounds, height 74 inches.  HEENT:  Normocephalic with the exception of mild facial droop on the right.  Atraumatic.  PERRLA.  Oral mucosa without thrush or lesions.  NECK:  Supple.  No cervical or supraclavicular masses.  LUNGS:  Clear to auscultation bilaterally.  Distant sounds.  No axillary  masses.  CARDIOVASCULAR:  Regular rate and rhythm.  No murmurs, rubs, or gallops.  ABDOMEN:  Soft, nontender.  Bowel sounds x4.  No palpable spleen or liver.  GU:  Deferred.  RECTAL:  Deferred.  EXTREMITIES:  No clubbing or cyanosis.  No edema.  SKIN:  Showing a healing excisional lesion on the left dorsal  aspect of the  lower arm which according to the patient has been negative in biopsies by  Dr. Mayford Knife.  No petechiae.  NEUROLOGIC:  As stated above, right facial droop.  Wide gait.  Otherwise  essentially unremarkable.   LABORATORY:  Hemoglobin 13.8, hematocrit 41.8, white count 4.2, platelets  173, MCV 91.  Sodium 139, potassium 3.8, BUN 22, creatinine 1.1, glucose  111.  Total bilirubin 0.9, alkaline phosphatase 53, AST 19, ALT 15, total  protein 6.5, albumin 3.4, calcium 9.0.  Troponin is negative.  A 2D echo is  pending.  CT of the chest, abdomen, and pelvis pending.   ASSESSMENT/PLAN:  This is a 77 year old white male with a prior history of  early stage melanoma status post excision, in March 2004, with followup  dermatology.  The patient presented, on September 20, 2005, with gait imbalance  and he was found to have soft tissue density surrounding the dens, as well  as low level positivity in the deep white matter, and basal ganglia on the  left, consistent with subacute infarct.  We were asked to see the patient  regarding the possibility of metastasis of the prior melanoma.  However at  this time, it would be important that the patient be evaluated by neurology  in order to rule out stroke.  In the interim, we will follow up the results  of the CT  of the chest, abdomen, and pelvis.  We will order an LDH to check the  positivity index marker.  We will follow with you and pending on the  results, further recommendations will follow.  Thank you very much for  allowing [place] the opportunity to participate in the care of Cody [name].      Marlowe Kays, P.A.      Blenda Nicely. [facility]  Electronically Signed    SW/MEDQ  D:  09/22/2005  T:  09/22/2005  Job:  [id number]   cc:   Dollene Cleveland, M.D.  Fax: [phone]

## 2010-11-13 NOTE — H&P (Signed)
NAME:  Cody [name], Cody [name] NO.:  [id number]   MEDICAL RECORD NO.:  [id number]          PATIENT TYPE:  INP   LOCATION:  1429                         FACILITY:  [facility] [place] [facility]   PHYSICIAN:  Barry Dienes. Eloise Harman, M.D.DATE OF BIRTH:  [birth year]   DATE OF ADMISSION:  09/20/2005  DATE OF DISCHARGE:                                HISTORY & PHYSICAL   CHIEF COMPLAINT:  Dizziness and difficulty walking   HISTORY OF PRESENT ILLNESS:  The patient is a 77 year old white male who is  well known to me.  He was in his usual state of excellent health until  approximately 630 hours this morning when he awoke but had a great deal of  difficulty getting out of bed due to a persistent sense of his being off  balance and not being able to control his legs well.  He did not have  vertigo.  He noted that he could use his arms well and had no difficulty  with speech.  He noted moderate unsteadiness with walking.  He had less than  3-4 minutes of transient palpitations this morning, but has had none since  arrival to the hospital.  He has not had any chest pain, vision changes,  headaches, or any difficulty swallowing food this evening.   PAST MEDICAL HISTORY:  1.  A vague history of her arrhythmia with a echocardiogram normal and past      EKGs not showing  arrhythmia.  2.  Benign prostatic hypertrophy with persistently elevated PSA test status      post March 2005 prostate biopsies negative.  3.  Hypertension.  4  Diverticulosis.  1.  Left carotid bruit with a November 2002 carotid ultrasound showing mild      stenosis of the right internal carotid artery, normal left internal      carotid artery, left subclavian stenosis and steal.  2.  Melanoma on the left forearm status post 2004 resection with axillary      lymph nodes normal.  3.  Osteopenia.  4.  Atypical chest pain with septal Q-waves leading to a September 2006      adenosine Cardiolite test.  This showed normal left ventricular  systolic      function with normal contractility and thickening throughout the      myocardium.  5.  He also has mild osteopenia on bone marrow density test done in January      2006.   MEDICATIONS PRIOR TO ADMISSION:  1.  Atenolol 50 mg p.o. daily.  2.  Digoxin 0.25 mg p.o. daily.  3.  Saw Palmetto once daily.   ALLERGIES AND INTOLERANCES:  1.  He has an EGG allergy.  2.  ASPIRIN has been associated with throat tightness.  3.  TETANUS  has given a large local reaction   PAST SURGICAL HISTORY:  1.  Remote tonsillectomy and adenoidectomy March 2004.  2.  Left forearm adenoma resection which was 1.4 mm thick for which he had a      wide local excision and a negative sentinel lymph node biopsy.  3.  March  2005, prostate gland biopsies which showed no evidence of      malignancy   SOCIAL HISTORY:  He is divorced, and a he is retired from work with  finances. He quit smoking in 1977 and has no history of alcohol abuse.   FAMILY HISTORY:  His father died at age 42 of myocardial infarction.  Mother  died at age 63 of complications of lung cancer and breast cancer.  He had a  sister who died at age 57 of pancreatic cancer.  A brother is approximately  age 42 and lives in [place].  He has a daughter, age 16 (Tamela Oddi), who  lives in [place], and a son, age 43 (Genevie Cheshire), will also lives in [place].  Genevie Cheshire has three children:  Arlys John, who is at [facility] [employer]; [place], who is  at [employer], and [place], [place], who lives with his parents in the [place].   REVIEW OF SYSTEMS:  He denies chest pain, cough, shortness of breath, vision  changes, headaches, dysphagia, arthralgias, anxiety, or depression.   INITIAL PHYSICAL EXAMINATION:  VITAL SIGNS:  Blood pressure 156/75, pulse  52, respirations 20, temperature 99.2, pulse oxygen saturation 97% on room  on room air.  GENERAL: He is an elderly white male who is in no apparent distress while  sitting up in his bed.  HEENT: Exam was within  normal limits with no facial droop.  NECK: Supple and without jugular venous distension.  There is a soft, left-  sided carotid bruit.  CHEST: Was clear to auscultation.  HEART: Had a regular rate and rhythm without significant murmur or gallop.  ABDOMEN:  Had normal bowel sounds and no hepatosplenomegaly or tenderness,  EXTREMITIES: Were without cyanosis, clubbing, or edema, and there were  normal bilateral pedal pulses.  NEUROLOGIC:  He is alert and oriented x4. Cranial nerves II-XII were normal.  Sensory exam was grossly normal. Motor exam was grossly normal. Cerebellar  finger-to-nose testing was significant for past pointing on the right, it  was normal on the left.  Gait assessment:  He had a mildly wide-based gait.  Romberg test was normal.   INITIAL LABORATORY STUDIES:  White blood cell count 4.2, hemoglobin 13,  hematocrit 41, platelets 173.  Serum sodium 139, potassium 3.8, chloride  106, CO2 27, BUN 22, creatinine 1.1, glucose 111. Troponin-I less than 0.05,  CK less than 1. Alkaline phosphatase 53.  Digoxin 0.3.   A CT scan of the head was without acute abnormalities.   A head MRI scan showed a left basal ganglia subacute infarct .   MRA scan showed no proximal stenoses or intracranial aneurysms. The parotid  and basilar arteries were widely patent with no areas of stenosis or  occlusion.  No intracranial aneurysm was seen.  Distal aspects of the MCA  and PCA branches appear relatively unremarkable.   IMPRESSION AND PLAN:  1.  Gait instability:  This is mild and most likely due to a recent left      basal ganglia distribution stroke.  The etiology is unclear and may be      due to rupture of a cholesterol plaque.  It seems unlikely that his      stroke was due to cardioembolic etiology.  I plan to check 24-hour EKG      telemetry to rule out a transient arrhythmia.  We will also check an     echocardiogram.  Will have a physical therapist and occupational  therapist  evaluate his rehabilitation needs.  In addition, will check a      MRI scan with IV contrast to rule out the unlikely possibility of      melanoma metastatic to the CNS.  2.  Hypertension:  His blood pressure systolic is mildly elevated, likely      due to anxiety concerning this new stroke.  We will check his serial      blood pressures closely and consider addition of Lisinopril to his      regimen if his blood pressure remains consistently elevated.  3.  Left carotid bruit:  This is not likely due to a high-grade stenosis      given the previous ultrasound findings and      the current MRA results.  As MRA is overly sensitive, it is unnecessary      for him to have a confirmatory carotid ultrasound exam.  We will      continue the use of Plavix to prevent further TIAs or strokes.      Unfortunately, an ASPIRIN allergy limits its use is this situation.           ______________________________  Barry Dienes Eloise Harman, M.D.     DGP/MEDQ  D:  09/20/2005  T:  09/21/2005  Job:  [id number]

## 2010-11-13 NOTE — Consult Note (Signed)
NAME:  Cody [name], LICCIARDI NO.:  [id number]   MEDICAL RECORD NO.:  [id number]          PATIENT TYPE:  INP   LOCATION:  1429                         FACILITY:  [facility]   PHYSICIAN:  Casimiro Needle L. Reynolds, M.D.DATE OF BIRTH:  10/29/1924   DATE OF CONSULTATION:  09/23/2005  DATE OF DISCHARGE:                                   CONSULTATION   REASON FOR EVALUATION:  Stroke and abnormal MRI.   HISTORY OF PRESENT ILLNESS:  This is the initial inpatient consultation  evaluation of this 77 year old man with past history which includes  hypertension and BPH.  The patient reports that he was feeling well the  night before, but woke up on the morning of March 26, and noted that he was  off balance.  Was told he had difficulty getting out of bed and ambulating  to the bathroom and back, which is definitely an acute change for him.  He  came to the emergency room that morning where CT of the head was normal, but  MRI of the brain demonstrated an acute infarct, and he was subsequently  admitted.  The patient reports that his symptoms have gradually gotten  better.  He also notes that when he woke up on the morning of March 26, he  was unable to write very legibly.  His hand writing has also cleared up  quite a bit over the past few days.  Presently, he is ambulating with sense  of a walker and raises a little bit unsteady.  He has worked in physical and  occupational therapy.  As part of his workup, he has undergone MRI of the  brain with contrast, because he does have history of remote melanoma.  This  date demonstrated a couple of small areas of enhancement in the right brain  and the possibility of metastatic disease was raised.  He was seen by the  Oncologist Service, and Dr. Clelia Croft recommended CT of the chest, abdomen and  pelvis which did not demonstrate any further evidence of metastatic disease  or any primary tumor.  Neurological consultation is requested for further  evaluation of his gait disorder and his abnormal MRI.   PAST MEDICAL HISTORY:  1.  As above.  2.  History of previous arrhythmia which he says is not atrial fibrillation      which has not been anticoagulated in the past.  3.  Left carotid bruit with benign ultrasounds.  4.  Osteopenia.   MEDICATIONS:  1.  Atenolol.  2.  Saw Palmetto.  3.  Digoxin.   FAMILY/SOCIAL HISTORY/REVIEW OF SYSTEMS:  Outlined in initial H&P by Dr.  Jarold Motto on September 20, 2005.   ALLERGIES:  ASPIRIN (FACE BLEW UP WHEN HE TOOK IT ONCE).   PHYSICAL EXAMINATION:  VITAL SIGNS:  Temperature 98.0, blood pressure  142/75, pulse 50, respirations 20, O2 saturation 98% on room air.  GENERAL APPEARANCE:  This is a healthy man in no acute distress.  HEENT:  Head:  Normocephalic, atraumatic.  Oropharynx benign.  NECK:  Supple without carotid or supraclavicular bruits.  HEART:  Regular  rate and rhythm without murmurs.  NEUROLOGICAL:  Mental status:  He is awake, alert, full oriented to time,  place and person.  Recent and remote memory are intact.  Attention span,  concentration and fund of knowledge are all appropriate.  Speech is fluent  and not dysarthric.  Mood is euthymic and affect appropriate.  Cranial  nerves:  Funduscopic exam was benign.  Pupils equal, brisk and reactive.  Extraocular movements are full without nystagmus.  Visual fields are full to  competition.  Hearing intact to conversational speech.  Facial sensation  intact to pinprick.  Face, tongue, and palate move normally and  symmetrically.  Shoulder shrug strength is normal.  Motor tension:  Normal  bulk and tone.  Normal strength in all accessory muscles.  Sensation intact  to light touch and pinprick in all extremities.  Coordination:  Rapid  movements performed a little bit slowly, especially on the right.  He has  mild dysmetria to finger-to-nose testing on the right, not so much on the  left.  He has mild bilateral heel-to-shin dysmetria.   Reflexes brisk and  symmetric throughout.  Toes are downgoing bilaterally.  Gait:  He arises  usually from the bed.  His stance is a little bit broad based, and he gets  uncomfortable standing with a narrow gait.  He will ambulate well with a  walker without difficulty.   LABORATORY DATA:  MRI of the brain both without contrast on September 20, 2005  and with contrast on September 21, 2005, are personally reviewed.  The September 20, 2005, study demonstrates an abnormality in diffusion with imaging as well as  the FLAIR and to a lesser extent the T2 weighted images which does appear to  be an acute infarct involving the lateral thalamus on the left.  Otherwise,  remarkable for some atrophy and white matter disease.  MRA is unremarkable.  MRI the next day with contrast demonstrates two small focal areas of  enhancement of the right brain, one in the head of the caudate nucleus and  the other in the high frontal parietal cortex, both of which are very small.  Differential for these would include metastatic lesions or possibly subacute  ischemic lesions.  He has had 2D echocardiogram which showed no source of  emboli, and telemetry monitor has been unremarkable.   IMPRESSION:  1.  Subacute left great stroke with right hand clumsiness and gait ataxia      which is improving.  Etiology is uncertain.  Most likely related to      small vessel disease related to hypertension.  There is no evidence of      cardiac source embolus.  2.  Abnormal MRI of two small lesions, possibly metastatic lesions.   RECOMMENDATIONS:  Would agree with Plavix or anti-platelet therapy.  He also  needs risk factor control with good control of his hypertension, monitoring  of his diabetes and lipids, goal LDL less than 100, normal homocysteine  level, etc.  He is working well with his therapists and will likely be able  to go home with home health PT/OT.  With regards to his abnormal findings on his scan, I think the most  reasonable course of action would be to repeat  MRI of the brain with contrast in 3-4 weeks.  If the abnormal findings  appear to be resolving, they might be due to subacute ischemia.  But, if  they are stable or enlarging, metastasis would have to be considered.  At  that point, a neurosurgical consultation could be undertaken.   Thank you for the consultation.      Michael L. Thad Ranger, M.D.  Electronically Signed     MLR/MEDQ  D:  09/23/2005  T:  09/25/2005  Job:  [id number]

## 2011-01-20 ENCOUNTER — Other Ambulatory Visit: Payer: Self-pay | Admitting: Dermatology

## 2011-03-24 ENCOUNTER — Other Ambulatory Visit: Payer: Self-pay | Admitting: Dermatology

## 2011-06-15 ENCOUNTER — Other Ambulatory Visit: Payer: Self-pay | Admitting: Dermatology

## 2011-07-13 ENCOUNTER — Other Ambulatory Visit: Payer: Self-pay | Admitting: Dermatology

## 2011-07-13 DIAGNOSIS — C44711 Basal cell carcinoma of skin of unspecified lower limb, including hip: Secondary | ICD-10-CM | POA: Diagnosis not present

## 2011-07-13 DIAGNOSIS — L821 Other seborrheic keratosis: Secondary | ICD-10-CM | POA: Diagnosis not present

## 2011-07-13 DIAGNOSIS — C44319 Basal cell carcinoma of skin of other parts of face: Secondary | ICD-10-CM | POA: Diagnosis not present

## 2011-07-13 DIAGNOSIS — D485 Neoplasm of uncertain behavior of skin: Secondary | ICD-10-CM | POA: Diagnosis not present

## 2011-07-13 DIAGNOSIS — L565 Disseminated superficial actinic porokeratosis (DSAP): Secondary | ICD-10-CM | POA: Diagnosis not present

## 2011-07-22 DIAGNOSIS — I6529 Occlusion and stenosis of unspecified carotid artery: Secondary | ICD-10-CM | POA: Diagnosis not present

## 2011-07-22 DIAGNOSIS — I10 Essential (primary) hypertension: Secondary | ICD-10-CM | POA: Diagnosis not present

## 2011-07-22 DIAGNOSIS — R609 Edema, unspecified: Secondary | ICD-10-CM | POA: Diagnosis not present

## 2011-07-27 ENCOUNTER — Other Ambulatory Visit (INDEPENDENT_AMBULATORY_CARE_PROVIDER_SITE_OTHER): Payer: Medicare Other | Admitting: *Deleted

## 2011-07-27 DIAGNOSIS — I6529 Occlusion and stenosis of unspecified carotid artery: Secondary | ICD-10-CM

## 2011-08-06 NOTE — Procedures (Unsigned)
CAROTID DUPLEX EXAM  INDICATION:  Left carotid disease  HISTORY: Diabetes:  No Cardiac:  No Hypertension:  Yes Smoking:  Previous Previous Surgery:  No CV History:  TIA / CVA 2007 Amaurosis Fugax No, Paresthesias No, Hemiparesis No                                      RIGHT             LEFT Brachial systolic pressure:         128               130 Brachial Doppler waveforms:         WNL               WNL Vertebral direction of flow:        Antegrade         Bidirectional DUPLEX VELOCITIES (cm/sec) CCA peak systolic                   119               104 ECA peak systolic                   142               109 ICA peak systolic                   83                90 ICA end diastolic                   12                13 PLAQUE MORPHOLOGY:                  Heterogeneous     Heterogeneous PLAQUE AMOUNT:                      Mild              Minimal PLAQUE LOCATION:                    ICA/ECA           ICA/ECA  IMPRESSION: 1. 1%-39% right internal carotid artery stenosis. 2. Minimal plaquing in the left internal carotid artery, the mid     segment is tortuous. 3. 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  Subclavian velocities are >500 cm/s. 4. Incidental finding:  Masses in the left thyroid which appear     complex and vascularized.  Clinical correlation is recommended if     warranted.  ___________________________________________ Janetta Hora. Fields, MD  LT/MEDQ  D:  07/27/2011  T:  07/27/2011  Job:  [id number]

## 2011-08-07 DIAGNOSIS — R002 Palpitations: Secondary | ICD-10-CM | POA: Diagnosis not present

## 2011-08-09 ENCOUNTER — Other Ambulatory Visit: Payer: Self-pay | Admitting: Dermatology

## 2011-08-09 DIAGNOSIS — C4442 Squamous cell carcinoma of skin of scalp and neck: Secondary | ICD-10-CM | POA: Diagnosis not present

## 2011-08-09 DIAGNOSIS — L570 Actinic keratosis: Secondary | ICD-10-CM | POA: Diagnosis not present

## 2011-08-16 DIAGNOSIS — I10 Essential (primary) hypertension: Secondary | ICD-10-CM | POA: Diagnosis not present

## 2011-08-16 DIAGNOSIS — I4891 Unspecified atrial fibrillation: Secondary | ICD-10-CM | POA: Diagnosis not present

## 2011-08-24 DIAGNOSIS — Z85828 Personal history of other malignant neoplasm of skin: Secondary | ICD-10-CM | POA: Diagnosis not present

## 2011-08-24 DIAGNOSIS — L570 Actinic keratosis: Secondary | ICD-10-CM | POA: Diagnosis not present

## 2011-09-07 DIAGNOSIS — R82998 Other abnormal findings in urine: Secondary | ICD-10-CM | POA: Diagnosis not present

## 2011-09-07 DIAGNOSIS — I10 Essential (primary) hypertension: Secondary | ICD-10-CM | POA: Diagnosis not present

## 2011-09-07 DIAGNOSIS — E785 Hyperlipidemia, unspecified: Secondary | ICD-10-CM | POA: Diagnosis not present

## 2011-09-07 DIAGNOSIS — M949 Disorder of cartilage, unspecified: Secondary | ICD-10-CM | POA: Diagnosis not present

## 2011-09-07 DIAGNOSIS — Z125 Encounter for screening for malignant neoplasm of prostate: Secondary | ICD-10-CM | POA: Diagnosis not present

## 2011-09-14 DIAGNOSIS — R3129 Other microscopic hematuria: Secondary | ICD-10-CM | POA: Diagnosis not present

## 2011-09-14 DIAGNOSIS — I10 Essential (primary) hypertension: Secondary | ICD-10-CM | POA: Diagnosis not present

## 2011-09-14 DIAGNOSIS — I4891 Unspecified atrial fibrillation: Secondary | ICD-10-CM | POA: Diagnosis not present

## 2011-09-14 DIAGNOSIS — Z0000 Encounter for general adult medical examination without abnormal findings: Secondary | ICD-10-CM | POA: Diagnosis not present

## 2011-09-14 DIAGNOSIS — E785 Hyperlipidemia, unspecified: Secondary | ICD-10-CM | POA: Diagnosis not present

## 2011-09-17 DIAGNOSIS — Z1212 Encounter for screening for malignant neoplasm of rectum: Secondary | ICD-10-CM | POA: Diagnosis not present

## 2011-10-14 DIAGNOSIS — L821 Other seborrheic keratosis: Secondary | ICD-10-CM | POA: Diagnosis not present

## 2011-10-14 DIAGNOSIS — L570 Actinic keratosis: Secondary | ICD-10-CM | POA: Diagnosis not present

## 2011-10-19 DIAGNOSIS — R972 Elevated prostate specific antigen [PSA]: Secondary | ICD-10-CM | POA: Diagnosis not present

## 2011-10-19 DIAGNOSIS — R3129 Other microscopic hematuria: Secondary | ICD-10-CM | POA: Diagnosis not present

## 2011-10-19 DIAGNOSIS — N401 Enlarged prostate with lower urinary tract symptoms: Secondary | ICD-10-CM | POA: Diagnosis not present

## 2011-11-08 DIAGNOSIS — R3129 Other microscopic hematuria: Secondary | ICD-10-CM | POA: Diagnosis not present

## 2011-11-16 DIAGNOSIS — L570 Actinic keratosis: Secondary | ICD-10-CM | POA: Diagnosis not present

## 2011-11-19 DIAGNOSIS — L719 Rosacea, unspecified: Secondary | ICD-10-CM | POA: Diagnosis not present

## 2011-11-20 DIAGNOSIS — H10829 Rosacea conjunctivitis, unspecified eye: Secondary | ICD-10-CM | POA: Insufficient documentation

## 2011-11-20 HISTORY — DX: Rosacea conjunctivitis, unspecified eye: H10.829

## 2011-12-09 DIAGNOSIS — I771 Stricture of artery: Secondary | ICD-10-CM | POA: Diagnosis not present

## 2011-12-09 DIAGNOSIS — R3129 Other microscopic hematuria: Secondary | ICD-10-CM | POA: Diagnosis not present

## 2011-12-09 DIAGNOSIS — N401 Enlarged prostate with lower urinary tract symptoms: Secondary | ICD-10-CM | POA: Diagnosis not present

## 2011-12-09 DIAGNOSIS — K4090 Unilateral inguinal hernia, without obstruction or gangrene, not specified as recurrent: Secondary | ICD-10-CM | POA: Diagnosis not present

## 2011-12-28 DIAGNOSIS — L821 Other seborrheic keratosis: Secondary | ICD-10-CM | POA: Diagnosis not present

## 2011-12-28 DIAGNOSIS — L570 Actinic keratosis: Secondary | ICD-10-CM | POA: Diagnosis not present

## 2012-02-15 ENCOUNTER — Other Ambulatory Visit: Payer: Self-pay | Admitting: Dermatology

## 2012-02-15 DIAGNOSIS — C44519 Basal cell carcinoma of skin of other part of trunk: Secondary | ICD-10-CM | POA: Diagnosis not present

## 2012-02-15 DIAGNOSIS — C44611 Basal cell carcinoma of skin of unspecified upper limb, including shoulder: Secondary | ICD-10-CM | POA: Diagnosis not present

## 2012-02-15 DIAGNOSIS — L821 Other seborrheic keratosis: Secondary | ICD-10-CM | POA: Diagnosis not present

## 2012-02-15 DIAGNOSIS — L570 Actinic keratosis: Secondary | ICD-10-CM | POA: Diagnosis not present

## 2012-03-28 DIAGNOSIS — L570 Actinic keratosis: Secondary | ICD-10-CM | POA: Diagnosis not present

## 2012-03-28 DIAGNOSIS — Z85828 Personal history of other malignant neoplasm of skin: Secondary | ICD-10-CM | POA: Diagnosis not present

## 2012-03-28 DIAGNOSIS — Z85820 Personal history of malignant melanoma of skin: Secondary | ICD-10-CM | POA: Diagnosis not present

## 2012-03-28 DIAGNOSIS — D485 Neoplasm of uncertain behavior of skin: Secondary | ICD-10-CM | POA: Diagnosis not present

## 2012-03-30 DIAGNOSIS — H6120 Impacted cerumen, unspecified ear: Secondary | ICD-10-CM | POA: Diagnosis not present

## 2012-03-30 DIAGNOSIS — I10 Essential (primary) hypertension: Secondary | ICD-10-CM | POA: Diagnosis not present

## 2012-03-30 DIAGNOSIS — I4891 Unspecified atrial fibrillation: Secondary | ICD-10-CM | POA: Diagnosis not present

## 2012-03-30 DIAGNOSIS — I6529 Occlusion and stenosis of unspecified carotid artery: Secondary | ICD-10-CM | POA: Diagnosis not present

## 2012-04-04 DIAGNOSIS — H902 Conductive hearing loss, unspecified: Secondary | ICD-10-CM | POA: Diagnosis not present

## 2012-04-04 DIAGNOSIS — H6120 Impacted cerumen, unspecified ear: Secondary | ICD-10-CM | POA: Diagnosis not present

## 2012-05-09 DIAGNOSIS — D485 Neoplasm of uncertain behavior of skin: Secondary | ICD-10-CM | POA: Diagnosis not present

## 2012-05-09 DIAGNOSIS — Z85820 Personal history of malignant melanoma of skin: Secondary | ICD-10-CM | POA: Diagnosis not present

## 2012-05-09 DIAGNOSIS — Q828 Other specified congenital malformations of skin: Secondary | ICD-10-CM | POA: Diagnosis not present

## 2012-05-09 DIAGNOSIS — Z85828 Personal history of other malignant neoplasm of skin: Secondary | ICD-10-CM | POA: Diagnosis not present

## 2012-05-09 DIAGNOSIS — L570 Actinic keratosis: Secondary | ICD-10-CM | POA: Diagnosis not present

## 2012-05-09 DIAGNOSIS — L821 Other seborrheic keratosis: Secondary | ICD-10-CM | POA: Diagnosis not present

## 2012-07-03 DIAGNOSIS — R609 Edema, unspecified: Secondary | ICD-10-CM | POA: Diagnosis not present

## 2012-07-03 DIAGNOSIS — I4891 Unspecified atrial fibrillation: Secondary | ICD-10-CM | POA: Diagnosis not present

## 2012-07-03 DIAGNOSIS — I10 Essential (primary) hypertension: Secondary | ICD-10-CM | POA: Diagnosis not present

## 2012-10-24 DIAGNOSIS — H61009 Unspecified perichondritis of external ear, unspecified ear: Secondary | ICD-10-CM | POA: Diagnosis not present

## 2012-10-24 DIAGNOSIS — L570 Actinic keratosis: Secondary | ICD-10-CM | POA: Diagnosis not present

## 2012-10-24 DIAGNOSIS — Z85828 Personal history of other malignant neoplasm of skin: Secondary | ICD-10-CM | POA: Diagnosis not present

## 2012-10-24 DIAGNOSIS — Z85820 Personal history of malignant melanoma of skin: Secondary | ICD-10-CM | POA: Diagnosis not present

## 2012-10-31 DIAGNOSIS — I10 Essential (primary) hypertension: Secondary | ICD-10-CM | POA: Diagnosis not present

## 2012-10-31 DIAGNOSIS — M899 Disorder of bone, unspecified: Secondary | ICD-10-CM | POA: Diagnosis not present

## 2012-10-31 DIAGNOSIS — E785 Hyperlipidemia, unspecified: Secondary | ICD-10-CM | POA: Diagnosis not present

## 2012-10-31 DIAGNOSIS — Z125 Encounter for screening for malignant neoplasm of prostate: Secondary | ICD-10-CM | POA: Diagnosis not present

## 2012-11-13 DIAGNOSIS — R609 Edema, unspecified: Secondary | ICD-10-CM | POA: Diagnosis not present

## 2012-11-13 DIAGNOSIS — I10 Essential (primary) hypertension: Secondary | ICD-10-CM | POA: Diagnosis not present

## 2012-11-13 DIAGNOSIS — Z1331 Encounter for screening for depression: Secondary | ICD-10-CM | POA: Diagnosis not present

## 2012-11-13 DIAGNOSIS — E785 Hyperlipidemia, unspecified: Secondary | ICD-10-CM | POA: Diagnosis not present

## 2012-11-13 DIAGNOSIS — Z0000 Encounter for general adult medical examination without abnormal findings: Secondary | ICD-10-CM | POA: Diagnosis not present

## 2012-11-13 DIAGNOSIS — M899 Disorder of bone, unspecified: Secondary | ICD-10-CM | POA: Diagnosis not present

## 2012-11-13 DIAGNOSIS — R972 Elevated prostate specific antigen [PSA]: Secondary | ICD-10-CM | POA: Diagnosis not present

## 2012-11-13 DIAGNOSIS — I4891 Unspecified atrial fibrillation: Secondary | ICD-10-CM | POA: Diagnosis not present

## 2012-11-15 DIAGNOSIS — Z1212 Encounter for screening for malignant neoplasm of rectum: Secondary | ICD-10-CM | POA: Diagnosis not present

## 2012-12-13 DIAGNOSIS — H5210 Myopia, unspecified eye: Secondary | ICD-10-CM | POA: Diagnosis not present

## 2012-12-13 DIAGNOSIS — H2510 Age-related nuclear cataract, unspecified eye: Secondary | ICD-10-CM | POA: Diagnosis not present

## 2012-12-13 DIAGNOSIS — H524 Presbyopia: Secondary | ICD-10-CM | POA: Diagnosis not present

## 2012-12-13 DIAGNOSIS — H25019 Cortical age-related cataract, unspecified eye: Secondary | ICD-10-CM | POA: Diagnosis not present

## 2012-12-27 DIAGNOSIS — K4090 Unilateral inguinal hernia, without obstruction or gangrene, not specified as recurrent: Secondary | ICD-10-CM | POA: Diagnosis not present

## 2012-12-27 DIAGNOSIS — I10 Essential (primary) hypertension: Secondary | ICD-10-CM | POA: Diagnosis not present

## 2012-12-27 DIAGNOSIS — Z681 Body mass index (BMI) 19 or less, adult: Secondary | ICD-10-CM | POA: Diagnosis not present

## 2013-01-26 ENCOUNTER — Encounter (INDEPENDENT_AMBULATORY_CARE_PROVIDER_SITE_OTHER): Payer: Self-pay

## 2013-01-30 ENCOUNTER — Ambulatory Visit (INDEPENDENT_AMBULATORY_CARE_PROVIDER_SITE_OTHER): Payer: Medicare Other | Admitting: General Surgery

## 2013-01-30 ENCOUNTER — Encounter (INDEPENDENT_AMBULATORY_CARE_PROVIDER_SITE_OTHER): Payer: Self-pay

## 2013-01-30 ENCOUNTER — Encounter (INDEPENDENT_AMBULATORY_CARE_PROVIDER_SITE_OTHER): Payer: Self-pay | Admitting: General Surgery

## 2013-01-30 VITALS — BP 116/62 | HR 62 | Temp 98.7°F | Resp 14 | Ht 74.0 in | Wt 145.8 lb

## 2013-01-30 DIAGNOSIS — K4090 Unilateral inguinal hernia, without obstruction or gangrene, not specified as recurrent: Secondary | ICD-10-CM

## 2013-01-30 NOTE — Progress Notes (Addendum)
Patient ID: Cody [name], male   DOB: May 30, 1925, 79 y.o.   MRN: [id number]  Chief Complaint  Patient presents with  . New Evaluation    eval RIH    HPI Cody [name] is a 79 y.o. male.   HPI  He is referred by Dr. Jarome Matin for evaluation of a newly diagnosed right inguinal hernia.  He intermittently strains to have a bowel movement. After one of these episodes, he noticed a stinging sensation and then a right inguinal bulge. He saw Dr. Eloise Harman about this and was diagnosed with a right inguinal hernia. He is referred here for further evaluation and treatment. He lives alone but has family locally. He does not have any difficulty starting the urinary stream but does have nocturia. He states his constipation is fairly well controlled with a MiraLAX equivalent.  Past Medical History  Diagnosis Date  . BPH (benign prostatic hyperplasia)   . Hypertension   . Diverticulosis   . Melanoma     left forearm  . Left carotid bruit   . Osteopenia   . Stroke 3/07  . Hyperlipidemia   . History of colon polyps   . Plantar fibromatosis     right foot  . Basal cell carcinoma     left forearm  . Inguinal hernia     Hx of afib  Past Surgical History  Procedure Laterality Date  . Colonoscopy  2008  . Left forearm melanoma    . Prostate biopsy      Family History  Problem Relation Age of Onset  . Cancer Mother     lung and breast  . Heart disease Father     MI  . Cancer Sister     pancreatic    Social History History  Substance Use Topics  . Smoking status: Former Games developer  . Smokeless tobacco: Never Used  . Alcohol Use: Yes     Comment: occasional wine    Allergies  Allergen Reactions  . Aspirin   . Eggs Or Egg-Derived Products Swelling    throat    Current Outpatient Prescriptions  Medication Sig Dispense Refill  . Cholecalciferol (VITAMIN D) 400 UNITS capsule Take 400 Units by mouth 2 (two) times daily.      . clopidogrel (PLAVIX) 75 MG tablet Take 75 mg by mouth  daily.      . digoxin (LANOXIN) 0.25 MG tablet Take 0.25 mg by mouth daily.      Marland Kitchen dutasteride (AVODART) 0.5 MG capsule Take 0.5 mg by mouth daily.      . hydrochlorothiazide (HYDRODIURIL) 25 MG tablet Take 12.5 mg by mouth daily.      Marland Kitchen NITROSTAT 0.4 MG SL tablet       . polyethylene glycol (MIRALAX / GLYCOLAX) packet Take 17 g by mouth every other day.      . pravastatin (PRAVACHOL) 20 MG tablet        No current facility-administered medications for this visit.    Review of Systems Review of Systems  Constitutional: Negative.   Respiratory: Negative.   Cardiovascular: Negative.   Gastrointestinal: Positive for constipation. Negative for abdominal pain.  Genitourinary: Negative for difficulty urinating.  Neurological:       Balance problems following a stroke in the past.  Hematological:       On chronic Plavix.    Blood pressure 116/62, pulse 62, temperature 98.7 F (37.1 C), temperature source Temporal, resp. rate 14, height 6\' 2"  (1.88 m), weight 145 lb  12.5 oz (66.125 kg).  Physical Exam Physical Exam  Constitutional:  Elderly male in NAD.  Walks with a cane.  HENT:  Head: Normocephalic and atraumatic.  Cardiovascular: Normal rate and regular rhythm.   Pulmonary/Chest: Effort normal and breath sounds normal.  Abdominal: Soft. He exhibits no mass. There is no tenderness.  Genitourinary:  Reducible right inguinal bulge is present. No left inguinal bulge is present. No testicular masses or penile lesions.  Musculoskeletal: He exhibits no edema.  Neurological: He is alert.  Skin: Skin is warm and dry.    Data Reviewed Note from Dr. Eloise Harman.  Assessment    Mildly symptomatic reducible right inguinal hernia. We had a long discussion about open repair and he is interested in this. Dr. Eloise Harman does not think he is at increased risk from a cardiovascular standpoint. Will need to stop the Plavix for 5 days before surgery and this was discussed with him.    Plan     Open right inguinal hernia repair with mesh. We'll keep him overnight in the hospital.  I have explained the procedure, risks, and aftercare of inguinal hernia repair.  Risks include but are not limited to bleeding, infection, wound problems, anesthesia, recurrence, bladder or intestine injury, urinary retention, testicular dysfunction, chronic pain, mesh problems.  He seems to understand and agrees to proceed. He will work on getting help at home after surgery.       Kaula Klenke J 01/30/2013, 5:22 PM

## 2013-01-30 NOTE — Patient Instructions (Signed)
Stop Plavix 5 days before the surgery.      [facility] _______[facility]  INGUINAL HERNIA REPAIR: POST OP INSTRUCTIONS  Always review your discharge instruction sheet given to you by the facility where your surgery was performed. IF YOU HAVE DISABILITY OR FAMILY LEAVE FORMS, YOU MUST BRING THEM TO THE OFFICE FOR PROCESSING.   DO NOT GIVE THEM TO YOUR DOCTOR.  1. A  prescription for pain medication may be given to you upon discharge.  Take your pain medication as prescribed, if needed.  If narcotic pain medicine is not needed, then you may take acetaminophen (Tylenol) or ibuprofen (Advil) as needed. 2. Take your usually prescribed medications unless otherwise directed. 3. If you need a refill on your pain medication, please contact your pharmacy.  They will contact our office to request authorization. Prescriptions will not be filled after 5 pm or on week-ends. 4. You should follow a light diet the first 24 hours after arrival home, such as soup and crackers, etc.  Be sure to include lots of fluids daily.  Resume your normal diet the day after surgery. 5. Most patients will experience some swelling and bruising around the umbilicus or in the groin and scrotum.  Ice packs and reclining will help.  Swelling and bruising can take several days to resolve.  6. It is common to experience some constipation if taking pain medication after surgery.  Increasing fluid intake and taking a stool softener (such as Colace) will usually help or prevent this problem from occurring.  A mild laxative (Milk of Magnesia or Miralax) should be taken according to package directions if there are no bowel movements after 48 hours. 7. Unless discharge instructions indicate otherwise, you may remove your bandages 24-48 hours after surgery, and you may shower at that time.  You may have steri-strips (small skin tapes) in place directly over the incision.  These strips should be left on the skin for 7-10 days.  If  your surgeon used skin glue on the incision, you may shower in 24 hours.  The glue will flake off over the next 2-3 weeks.  Any sutures or staples will be removed at the office during your follow-up visit. 8. ACTIVITIES:  You may resume regular (light) daily activities beginning the next day-such as daily self-care, walking, climbing stairs-gradually increasing activities as tolerated.  You may have sexual intercourse when it is comfortable.  Refrain from any heavy lifting or straining until approved by your doctor. a. You may drive when you are no longer taking prescription pain medication, you can comfortably wear a seatbelt, and you can safely maneuver your car and apply brakes. b. RETURN TO WORK:  __________________________________________________________ 9. You should see your doctor in the office for a follow-up appointment approximately 2-3 weeks after your surgery.  Make sure that you call for this appointment within a day or two after you arrive home to insure a convenient appointment time. 10. OTHER INSTRUCTIONS:  __________________________________________________________________________________________________________________________________________________________________________________________  WHEN TO CALL YOUR DOCTOR: 1. Fever over 101.0 2. Inability to urinate 3. Nausea and/or vomiting 4. Extreme swelling or bruising 5. Continued bleeding from incision. 6. Increased pain, redness, or drainage from the incision  The clinic staff is available to answer your questions during regular business hours.  Please don't hesitate to call and ask to speak to one of the nurses for clinical concerns.  If you have a medical emergency, go to the nearest emergency room or call 911.  A Careers adviser from [facility] is always on call at the hospital   [address], [address], [place], [place]  [address] ?  [address] [address], [place], [place]   [address] [phone] ? [phone] ? FAX  [phone] Web site: [email or web address]

## 2013-02-06 ENCOUNTER — Encounter (HOSPITAL_COMMUNITY): Payer: Self-pay | Admitting: Pharmacy Technician

## 2013-02-06 ENCOUNTER — Telehealth (INDEPENDENT_AMBULATORY_CARE_PROVIDER_SITE_OTHER): Payer: Self-pay | Admitting: *Deleted

## 2013-02-06 NOTE — Telephone Encounter (Signed)
Patient called with some questions.  Patient states he was told by Dr. Abbey Chatters to ask for [facility] CMA to answer his questions so patient only wants to speak with [facility] CMA.  Explained that Cyndra Numbers CMA is currently working with a doctor so patient just asked for a message to be sent to have patient give him a call back.

## 2013-02-07 ENCOUNTER — Telehealth (INDEPENDENT_AMBULATORY_CARE_PROVIDER_SITE_OTHER): Payer: Self-pay

## 2013-02-07 NOTE — Telephone Encounter (Signed)
Pt called today with several questions/concerns about his open inguinal hernia repair surgery.  He is concerned about being put to sleep, and seemed confused as to why he was having open repair as opposed to laparoscopic.  I went over Dr. Maris Berger last note with him, which documents the patient's preference for open repair, but his memory is not good and he does not recall any of their conversation.  He has cancelled his surgery.  I suggested he call Dr. Jarold Motto and speak with him about his health concerns.  In the meantime I will make Dr. Abbey Chatters aware.

## 2013-02-08 ENCOUNTER — Encounter (INDEPENDENT_AMBULATORY_CARE_PROVIDER_SITE_OTHER): Payer: Self-pay | Admitting: General Surgery

## 2013-02-08 NOTE — Progress Notes (Signed)
Patient ID: Cody [name], male   DOB: 1924/12/29, 79 y.o.   MRN: [id number] I spoke with him about questions he had regarding his inguinal hernia, incarceration and strangulation risk, reason for open repair vs laparoscopic repair for him, and types of anesthesia.  He has cancelled his surgery for now as he is not sure he wants to have it done.  I told him if he changed his mind to call us back.

## 2013-02-14 ENCOUNTER — Inpatient Hospital Stay (HOSPITAL_COMMUNITY): Admission: RE | Admit: 2013-02-14 | Payer: Medicare Other | Source: Ambulatory Visit

## 2013-02-20 ENCOUNTER — Ambulatory Visit (HOSPITAL_COMMUNITY): Admission: RE | Admit: 2013-02-20 | Payer: Medicare Other | Source: Ambulatory Visit | Admitting: General Surgery

## 2013-02-20 ENCOUNTER — Encounter (HOSPITAL_COMMUNITY): Admission: RE | Payer: Self-pay | Source: Ambulatory Visit

## 2013-02-20 SURGERY — REPAIR, HERNIA, INGUINAL, ADULT
Anesthesia: General | Laterality: Right

## 2013-03-06 ENCOUNTER — Encounter (INDEPENDENT_AMBULATORY_CARE_PROVIDER_SITE_OTHER): Payer: Medicare Other | Admitting: General Surgery

## 2013-03-06 DIAGNOSIS — R079 Chest pain, unspecified: Secondary | ICD-10-CM | POA: Diagnosis not present

## 2013-03-07 ENCOUNTER — Telehealth (INDEPENDENT_AMBULATORY_CARE_PROVIDER_SITE_OTHER): Payer: Self-pay

## 2013-03-07 DIAGNOSIS — I4891 Unspecified atrial fibrillation: Secondary | ICD-10-CM | POA: Diagnosis not present

## 2013-03-07 DIAGNOSIS — K4090 Unilateral inguinal hernia, without obstruction or gangrene, not specified as recurrent: Secondary | ICD-10-CM | POA: Diagnosis not present

## 2013-03-07 DIAGNOSIS — R079 Chest pain, unspecified: Secondary | ICD-10-CM | POA: Diagnosis not present

## 2013-03-07 DIAGNOSIS — Z681 Body mass index (BMI) 19 or less, adult: Secondary | ICD-10-CM | POA: Diagnosis not present

## 2013-03-07 NOTE — Telephone Encounter (Signed)
Called to check on pt.  He had several questions regarding his hospital stay, as well as concerns about bleeding after his surgery.  I explained that since he is stopping his Plavix (per Dr. Eloise Harman) 5 days prior to surgery there should be no complications with bleeding.  He is going to call Dr. Eloise Harman today for confirmation on when to resume taking it. He has angina and wanted assurance that if he woke in the middle of the night they would be able to treat him for this.  Pt was grateful and reassured.

## 2013-03-12 ENCOUNTER — Inpatient Hospital Stay (HOSPITAL_COMMUNITY): Admission: RE | Admit: 2013-03-12 | Payer: Medicare Other | Source: Ambulatory Visit

## 2013-03-12 ENCOUNTER — Ambulatory Visit (HOSPITAL_COMMUNITY): Payer: Medicare Other | Attending: Internal Medicine | Admitting: Radiology

## 2013-03-12 VITALS — BP 131/67 | HR 53 | Ht 74.0 in | Wt 144.0 lb

## 2013-03-12 DIAGNOSIS — I6529 Occlusion and stenosis of unspecified carotid artery: Secondary | ICD-10-CM | POA: Insufficient documentation

## 2013-03-12 DIAGNOSIS — I4892 Unspecified atrial flutter: Secondary | ICD-10-CM

## 2013-03-12 DIAGNOSIS — R079 Chest pain, unspecified: Secondary | ICD-10-CM | POA: Insufficient documentation

## 2013-03-12 DIAGNOSIS — Z8249 Family history of ischemic heart disease and other diseases of the circulatory system: Secondary | ICD-10-CM | POA: Insufficient documentation

## 2013-03-12 DIAGNOSIS — I10 Essential (primary) hypertension: Secondary | ICD-10-CM | POA: Diagnosis not present

## 2013-03-12 DIAGNOSIS — Z8673 Personal history of transient ischemic attack (TIA), and cerebral infarction without residual deficits: Secondary | ICD-10-CM | POA: Insufficient documentation

## 2013-03-12 DIAGNOSIS — Z87891 Personal history of nicotine dependence: Secondary | ICD-10-CM | POA: Insufficient documentation

## 2013-03-12 DIAGNOSIS — Z01818 Encounter for other preprocedural examination: Secondary | ICD-10-CM | POA: Diagnosis not present

## 2013-03-12 MED ORDER — AMINOPHYLLINE 25 MG/ML IV SOLN
75.0000 mg | Freq: Once | INTRAVENOUS | Status: AC
  Administered 2013-03-12: 75 mg via INTRAVENOUS

## 2013-03-12 MED ORDER — REGADENOSON 0.4 MG/5ML IV SOLN
0.4000 mg | Freq: Once | INTRAVENOUS | Status: AC
  Administered 2013-03-12: 0.4 mg via INTRAVENOUS

## 2013-03-12 MED ORDER — TECHNETIUM TC 99M SESTAMIBI GENERIC - CARDIOLITE
30.0000 | Freq: Once | INTRAVENOUS | Status: AC | PRN
  Administered 2013-03-12: 30 via INTRAVENOUS

## 2013-03-12 MED ORDER — TECHNETIUM TC 99M SESTAMIBI GENERIC - CARDIOLITE
11.0000 | Freq: Once | INTRAVENOUS | Status: AC | PRN
  Administered 2013-03-12: 11 via INTRAVENOUS

## 2013-03-12 NOTE — Progress Notes (Signed)
[facility] SITE 3 NUCLEAR MED [address] [place], [place] [address] [phone]    Cardiology Nuclear Med Study  Cody [name] is a 79 y.o. male     MRN : [id number]     DOB: 06-23-1925  Procedure Date: 03/12/2013  Nuclear Med Background Indication for Stress Test:  Evaluation for Ischemia and Pending Surgical Clearance for Hernia Repair by Dr. Abbey Chatters History:  '06 [id number]; '07 Echo:normal, mild MVP Cardiac Risk Factors: Carotid Disease, CVA, Family History - CAD, History of Smoking, Hypertension and Lipids  Symptoms:  Chest Pain (last episode of chest discomfort was Saturday) and Rapid HR   Nuclear Pre-Procedure Caffeine/Decaff Intake:  None NPO After: 11:00pm   Lungs:  Clear. O2 Sat: 98% on room air. IV 0.9% NS with Angio Cath:  22g  IV Site: R Hand  IV Started by:  Bonnita Levan, RN  Chest Size (in):  42 Cup Size: n/a  Height: 6\' 2"  (1.88 m)  Weight:  144 lb (65.318 kg)  BMI:  Body mass index is 18.48 kg/(m^2). Tech Comments:  N/A    Nuclear Med Study 1 or 2 day study: 1 day  Stress Test Type:  Lexiscan  Reading MD: Olga Millers, MD  Order Authorizing Provider:  Jarome Matin, MD  Resting Radionuclide: Technetium 23m Sestamibi  Resting Radionuclide Dose: 11.0 mCi   Stress Radionuclide:  Technetium 26m Sestamibi  Stress Radionuclide Dose: 33.0 mCi           Stress Protocol Rest HR: 53 Stress HR: 98  Rest BP: 131/67 Stress BP: 137/59  Exercise Time (min): n/a METS: n/a   Predicted Max HR: 135 bpm % Max HR: 72.59 bpm Rate Pressure Product: 46962   Dose of Adenosine (mg):  n/a Dose of Lexiscan: 0.4 mg  Dose of Atropine (mg): n/a Dose of Dobutamine: n/a mcg/kg/min (at max HR)  Stress Test Technologist: Smiley Houseman, CMA-N  Nuclear Technologist:  Doyne Keel, CNMT     Rest Procedure:  Myocardial perfusion imaging was performed at rest 45 minutes following the intravenous administration of Technetium 9m Sestamibi.  Rest ECG: Sinus  bradycardia, first degree AV block, Nonspecific ST changes.  Stress Procedure:  The patient received IV Lexiscan 0.4 mg over 15-seconds.  Technetium 33m Sestamibi injected at 30-seconds.  Patient was symptomatic with Lexiscan and was given Aminophylline 75 mg with relief.  Occasional PVC's with rare couplets and PAC's were noted.  Quantitative spect images were obtained after a 45 minute delay.  Stress ECG: No significant ST segment change suggestive of ischemia.  QPS Raw Data Images:  Acquisition technically good; normal left ventricular size. Stress Images:  There is decreased uptake in the inferior wall. Rest Images:  There is decreased uptake in the inferior wall. Subtraction (SDS):  There is a fixed defect that is most consistent with a previous infarction. Transient Ischemic Dilatation (Normal <1.22):  n/a Lung/Heart Ratio (Normal <0.45):  0.30  Quantitative Gated Spect Images QGS EDV:  n/a QGS ESV:  n/a  Impression Exercise Capacity:  Lexiscan with no exercise. BP Response:  Normal blood pressure response. Clinical Symptoms:  No chest pain or dyspnea. ECG Impression:  No significant ST segment change suggestive of ischemia. Comparison with Prior Nuclear Study: No previous nuclear study performed  Overall Impression:  Low risk stress nuclear study with a large, severe intensity, fixed inferior defect consistent with prior infarct; no ischemia.  LV Ejection Fraction: Study not gated.  LV Wall Motion:  Study not gated  Cody [name]

## 2013-03-13 ENCOUNTER — Encounter (HOSPITAL_COMMUNITY)
Admission: RE | Admit: 2013-03-13 | Discharge: 2013-03-13 | Disposition: A | Discharge status: Home / Self Care | Payer: Medicare Other | Source: Ambulatory Visit | Attending: General Surgery | Admitting: General Surgery

## 2013-03-13 ENCOUNTER — Ambulatory Visit (HOSPITAL_COMMUNITY)
Admission: RE | Admit: 2013-03-13 | Discharge: 2013-03-13 | Disposition: A | Discharge status: Home / Self Care | Payer: Medicare Other | Source: Ambulatory Visit | Attending: General Surgery | Admitting: General Surgery

## 2013-03-13 ENCOUNTER — Encounter (HOSPITAL_COMMUNITY): Payer: Self-pay

## 2013-03-13 DIAGNOSIS — I10 Essential (primary) hypertension: Secondary | ICD-10-CM | POA: Insufficient documentation

## 2013-03-13 HISTORY — DX: Angina pectoris, unspecified: I20.9

## 2013-03-13 HISTORY — DX: Gastro-esophageal reflux disease without esophagitis: K21.9

## 2013-03-13 LAB — COMPREHENSIVE METABOLIC PANEL
AST: 18 U/L (ref 0–37)
Albumin: 3.7 g/dL (ref 3.5–5.2)
BUN: 22 mg/dL (ref 6–23)
Calcium: 10 mg/dL (ref 8.4–10.5)
Creatinine, Ser: 0.95 mg/dL (ref 0.50–1.35)
Total Bilirubin: 0.5 mg/dL (ref 0.3–1.2)
Total Protein: 7.1 g/dL (ref 6.0–8.3)

## 2013-03-13 LAB — CBC WITH DIFFERENTIAL/PLATELET
Eosinophils Absolute: 0.2 10*3/uL (ref 0.0–0.7)
Lymphocytes Relative: 31 % (ref 12–46)
MCV: 92.5 fL (ref 78.0–100.0)
Monocytes Absolute: 0.3 10*3/uL (ref 0.1–1.0)
Monocytes Relative: 6 % (ref 3–12)
Platelets: 189 10*3/uL (ref 150–400)
RDW: 13.2 % (ref 11.5–15.5)
WBC: 4.1 10*3/uL (ref 4.0–10.5)

## 2013-03-13 LAB — PROTIME-INR
INR: 1.02 (ref 0.00–1.49)
Prothrombin Time: 13.2 seconds (ref 11.6–15.2)

## 2013-03-13 NOTE — Patient Instructions (Signed)
20 [name]  03/13/2013   Your procedure is scheduled on:  03/15/13   THURSDAY  Report to [facility] at  1015     AM.  Call this number if you have problems the morning of surgery: [phone]       Remember:   Do not eat food  After Midnight. Wednesday NIGHT,     MAY HAVE CLEAR LIQUIDS UNTIL 0645 Thursday AM, THEN NOTHING BY MOUTH   Take these medicines the morning of surgery with A SIP OF WATER: Lanoxin                                     May take NITROSTAT if needed   .  Contacts, dentures or partial plates can not be worn to surgery  Leave suitcase in the car. After surgery it may be brought to your room.  For patients admitted to the hospital, checkout time is 11:00 AM day of  discharge.             SPECIAL INSTRUCTIONS- SEE [place] PREPARING FOR SURGERY INSTRUCTION SHEET-     DO NOT WEAR JEWELRY, LOTIONS, POWDERS, OR PERFUMES.  WOMEN-- DO NOT SHAVE LEGS OR UNDERARMS FOR 12 HOURS BEFORE SHOWERS. MEN MAY SHAVE FACE.  Patients discharged the day of surgery will not be allowed to drive home. IF going home the day of surgery, you must have a driver and someone to stay with you for the first 24 hours  Name and phone number of your driver:                                                                                                            Charles Andringa  PST [phone]                 FAILURE TO FOLLOW THESE INSTRUCTIONS MAY RESULT IN  CANCELLATION   OF YOUR SURGERY                                                  Patient Signature _____________________________

## 2013-03-13 NOTE — Progress Notes (Signed)
EKG 03/07/13 chart

## 2013-03-14 NOTE — Progress Notes (Signed)
LOV Dr Eloise Harman 03/13/13 on chart

## 2013-03-15 ENCOUNTER — Encounter (HOSPITAL_COMMUNITY): Payer: Self-pay | Admitting: *Deleted

## 2013-03-15 ENCOUNTER — Encounter (HOSPITAL_COMMUNITY): Payer: Self-pay | Admitting: Anesthesiology

## 2013-03-15 ENCOUNTER — Encounter (HOSPITAL_COMMUNITY)
Admission: RE | Disposition: A | Discharge status: Home / Self Care | Payer: Self-pay | Source: Ambulatory Visit | Attending: General Surgery

## 2013-03-15 ENCOUNTER — Ambulatory Visit (HOSPITAL_COMMUNITY)
Admission: RE | Admit: 2013-03-15 | Discharge: 2013-03-16 | Disposition: A | Discharge status: Home / Self Care | Payer: Medicare Other | Source: Ambulatory Visit | Attending: General Surgery | Admitting: General Surgery

## 2013-03-15 ENCOUNTER — Ambulatory Visit (HOSPITAL_COMMUNITY): Payer: Medicare Other | Admitting: Anesthesiology

## 2013-03-15 DIAGNOSIS — Z79899 Other long term (current) drug therapy: Secondary | ICD-10-CM | POA: Insufficient documentation

## 2013-03-15 DIAGNOSIS — Z8673 Personal history of transient ischemic attack (TIA), and cerebral infarction without residual deficits: Secondary | ICD-10-CM | POA: Insufficient documentation

## 2013-03-15 DIAGNOSIS — K219 Gastro-esophageal reflux disease without esophagitis: Secondary | ICD-10-CM | POA: Insufficient documentation

## 2013-03-15 DIAGNOSIS — R339 Retention of urine, unspecified: Secondary | ICD-10-CM | POA: Insufficient documentation

## 2013-03-15 DIAGNOSIS — Z7902 Long term (current) use of antithrombotics/antiplatelets: Secondary | ICD-10-CM | POA: Insufficient documentation

## 2013-03-15 DIAGNOSIS — E785 Hyperlipidemia, unspecified: Secondary | ICD-10-CM | POA: Diagnosis not present

## 2013-03-15 DIAGNOSIS — R319 Hematuria, unspecified: Secondary | ICD-10-CM | POA: Insufficient documentation

## 2013-03-15 DIAGNOSIS — I10 Essential (primary) hypertension: Secondary | ICD-10-CM | POA: Diagnosis not present

## 2013-03-15 DIAGNOSIS — K4090 Unilateral inguinal hernia, without obstruction or gangrene, not specified as recurrent: Secondary | ICD-10-CM | POA: Insufficient documentation

## 2013-03-15 HISTORY — PX: INSERTION OF MESH: SHX5868

## 2013-03-15 HISTORY — PX: INGUINAL HERNIA REPAIR: SHX194

## 2013-03-15 SURGERY — REPAIR, HERNIA, INGUINAL, ADULT
Anesthesia: General | Site: Groin | Laterality: Right | Wound class: Clean

## 2013-03-15 MED ORDER — 0.9 % SODIUM CHLORIDE (POUR BTL) OPTIME
TOPICAL | Status: DC | PRN
  Administered 2013-03-15: 1000 mL

## 2013-03-15 MED ORDER — LACTATED RINGERS IV SOLN
INTRAVENOUS | Status: DC | PRN
  Administered 2013-03-15 (×2): via INTRAVENOUS

## 2013-03-15 MED ORDER — FENTANYL CITRATE 0.05 MG/ML IJ SOLN
INTRAMUSCULAR | Status: DC | PRN
  Administered 2013-03-15: 50 ug via INTRAVENOUS

## 2013-03-15 MED ORDER — DIGOXIN 250 MCG PO TABS
0.2500 mg | ORAL_TABLET | Freq: Every morning | ORAL | Status: DC
  Administered 2013-03-16: 0.25 mg via ORAL
  Filled 2013-03-15: qty 1

## 2013-03-15 MED ORDER — EPHEDRINE SULFATE 50 MG/ML IJ SOLN
INTRAMUSCULAR | Status: DC | PRN
  Administered 2013-03-15: 10 mg via INTRAVENOUS

## 2013-03-15 MED ORDER — BUPIVACAINE HCL (PF) 0.5 % IJ SOLN
INTRAMUSCULAR | Status: AC
  Filled 2013-03-15: qty 30

## 2013-03-15 MED ORDER — PROMETHAZINE HCL 25 MG/ML IJ SOLN: 6.2500 mg | INTRAMUSCULAR | Status: DC | PRN

## 2013-03-15 MED ORDER — ONDANSETRON HCL 4 MG/2ML IJ SOLN: 4.0000 mg | Freq: Four times a day (QID) | INTRAMUSCULAR | Status: DC | PRN

## 2013-03-15 MED ORDER — HYDROCHLOROTHIAZIDE 12.5 MG PO CAPS
12.5000 mg | ORAL_CAPSULE | Freq: Every evening | ORAL | Status: DC
  Filled 2013-03-15 (×3): qty 1

## 2013-03-15 MED ORDER — HYDROCHLOROTHIAZIDE 25 MG PO TABS
12.5000 mg | ORAL_TABLET | Freq: Every evening | ORAL | Status: DC
  Filled 2013-03-15: qty 0.5

## 2013-03-15 MED ORDER — GLYCOPYRROLATE 0.2 MG/ML IJ SOLN
INTRAMUSCULAR | Status: DC | PRN
  Administered 2013-03-15: 0.2 mg via INTRAVENOUS

## 2013-03-15 MED ORDER — ONDANSETRON HCL 4 MG/2ML IJ SOLN
INTRAMUSCULAR | Status: DC | PRN
  Administered 2013-03-15: 4 mg via INTRAVENOUS

## 2013-03-15 MED ORDER — CEFAZOLIN SODIUM-DEXTROSE 2-3 GM-% IV SOLR
2.0000 g | INTRAVENOUS | Status: AC
  Administered 2013-03-15: 2 g via INTRAVENOUS

## 2013-03-15 MED ORDER — LIDOCAINE HCL (CARDIAC) 20 MG/ML IV SOLN
INTRAVENOUS | Status: DC | PRN
  Administered 2013-03-15: 100 mg via INTRAVENOUS

## 2013-03-15 MED ORDER — FENTANYL CITRATE 0.05 MG/ML IJ SOLN: 25.0000 ug | INTRAMUSCULAR | Status: DC | PRN

## 2013-03-15 MED ORDER — ONDANSETRON HCL 4 MG PO TABS: 4.0000 mg | ORAL_TABLET | Freq: Four times a day (QID) | ORAL | Status: DC | PRN

## 2013-03-15 MED ORDER — HEPARIN SODIUM (PORCINE) 5000 UNIT/ML IJ SOLN
5000.0000 [IU] | Freq: Three times a day (TID) | INTRAMUSCULAR | Status: DC
  Administered 2013-03-15 – 2013-03-16 (×2): 5000 [IU] via SUBCUTANEOUS
  Filled 2013-03-15 (×5): qty 1

## 2013-03-15 MED ORDER — DEXAMETHASONE SODIUM PHOSPHATE 10 MG/ML IJ SOLN
INTRAMUSCULAR | Status: DC | PRN
  Administered 2013-03-15: 10 mg via INTRAVENOUS

## 2013-03-15 MED ORDER — BUPIVACAINE HCL 0.5 % IJ SOLN
INTRAMUSCULAR | Status: DC | PRN
  Administered 2013-03-15: 29 mL

## 2013-03-15 MED ORDER — ETOMIDATE 2 MG/ML IV SOLN
INTRAVENOUS | Status: DC | PRN
  Administered 2013-03-15: 20 mg via INTRAVENOUS

## 2013-03-15 MED ORDER — DUTASTERIDE 0.5 MG PO CAPS
0.5000 mg | ORAL_CAPSULE | ORAL | Status: DC
  Administered 2013-03-16: 0.5 mg via ORAL
  Filled 2013-03-15: qty 1

## 2013-03-15 MED ORDER — CEFAZOLIN SODIUM-DEXTROSE 2-3 GM-% IV SOLR
INTRAVENOUS | Status: AC
  Filled 2013-03-15: qty 50

## 2013-03-15 MED ORDER — NITROGLYCERIN 0.4 MG SL SUBL: 0.4000 mg | SUBLINGUAL_TABLET | SUBLINGUAL | Status: DC | PRN

## 2013-03-15 MED ORDER — MEPERIDINE HCL 50 MG/ML IJ SOLN: 6.2500 mg | INTRAMUSCULAR | Status: DC | PRN

## 2013-03-15 MED ORDER — HYDROCODONE-ACETAMINOPHEN 5-325 MG PO TABS
1.0000 | ORAL_TABLET | ORAL | Status: DC | PRN
  Administered 2013-03-15 – 2013-03-16 (×4): 1 via ORAL
  Filled 2013-03-15 (×3): qty 1
  Filled 2013-03-15: qty 2

## 2013-03-15 MED ORDER — KCL-LACTATED RINGERS-D5W 20 MEQ/L IV SOLN
INTRAVENOUS | Status: DC
  Administered 2013-03-15: 18:00:00 via INTRAVENOUS
  Filled 2013-03-15: qty 1000

## 2013-03-15 SURGICAL SUPPLY — 43 items
APL SKNCLS STERI-STRIP NONHPOA (GAUZE/BANDAGES/DRESSINGS) ×2
BENZOIN TINCTURE PRP APPL 2/3 (GAUZE/BANDAGES/DRESSINGS) ×3 IMPLANT
BLADE HEX COATED 2.75 (ELECTRODE) ×3 IMPLANT
BLADE SURG 15 STRL LF DISP TIS (BLADE) ×2 IMPLANT
BLADE SURG 15 STRL SS (BLADE) ×2
BLADE SURG SZ10 CARB STEEL (BLADE) ×3 IMPLANT
CANISTER SUCTION 2500CC (MISCELLANEOUS) ×3 IMPLANT
CLOTH BEACON ORANGE TIMEOUT ST (SAFETY) ×3 IMPLANT
DECANTER SPIKE VIAL GLASS SM (MISCELLANEOUS) ×3 IMPLANT
DRAIN PENROSE 18X1/2 LTX STRL (DRAIN) ×3 IMPLANT
DRAPE INCISE IOBAN 66X45 STRL (DRAPES) ×3 IMPLANT
DRAPE LAPAROTOMY TRNSV 102X78 (DRAPE) ×3 IMPLANT
DRAPE UTILITY XL STRL (DRAPES) ×3 IMPLANT
DRESSING TELFA 8X3 (GAUZE/BANDAGES/DRESSINGS) IMPLANT
DRSG TEGADERM 2-3/8X2-3/4 SM (GAUZE/BANDAGES/DRESSINGS) IMPLANT
DRSG TEGADERM 4X4.75 (GAUZE/BANDAGES/DRESSINGS) ×3 IMPLANT
ELECT REM PT RETURN 9FT ADLT (ELECTROSURGICAL) ×3
ELECTRODE REM PT RTRN 9FT ADLT (ELECTROSURGICAL) ×2 IMPLANT
GLOVE ECLIPSE 8.0 STRL XLNG CF (GLOVE) ×3 IMPLANT
GLOVE INDICATOR 8.0 STRL GRN (GLOVE) ×6 IMPLANT
GOWN PREVENTION PLUS LG XLONG (DISPOSABLE) ×3 IMPLANT
GOWN STRL REIN XL XLG (GOWN DISPOSABLE) ×12 IMPLANT
KIT BASIN OR (CUSTOM PROCEDURE TRAY) ×3 IMPLANT
MESH HERNIA 3X6 (Mesh General) ×3 IMPLANT
NEEDLE HYPO 25X1 1.5 SAFETY (NEEDLE) ×3 IMPLANT
NS IRRIG 1000ML POUR BTL (IV SOLUTION) ×3 IMPLANT
PACK BASIC VI WITH GOWN DISP (CUSTOM PROCEDURE TRAY) ×3 IMPLANT
PENCIL BUTTON HOLSTER BLD 10FT (ELECTRODE) ×3 IMPLANT
PUMP ON Q 100MLX2ML/HR (PAIN MANAGEMENT) IMPLANT
SPONGE GAUZE 4X4 12PLY (GAUZE/BANDAGES/DRESSINGS) ×3 IMPLANT
SPONGE LAP 4X18 X RAY DECT (DISPOSABLE) ×3 IMPLANT
STRIP CLOSURE SKIN 1/2X4 (GAUZE/BANDAGES/DRESSINGS) ×3 IMPLANT
SUT MNCRL AB 4-0 PS2 18 (SUTURE) ×3 IMPLANT
SUT PROLENE 2 0 CT2 30 (SUTURE) ×6 IMPLANT
SUT VIC AB 2-0 SH 18 (SUTURE) ×3 IMPLANT
SUT VIC AB 3-0 54XBRD REEL (SUTURE) ×2 IMPLANT
SUT VIC AB 3-0 BRD 54 (SUTURE) ×2
SUT VIC AB 3-0 SH 27 (SUTURE) ×6
SUT VIC AB 3-0 SH 27XBRD (SUTURE) ×4 IMPLANT
SYR BULB IRRIGATION 50ML (SYRINGE) ×3 IMPLANT
SYR CONTROL 10ML LL (SYRINGE) ×3 IMPLANT
TOWEL OR 17X26 10 PK STRL BLUE (TOWEL DISPOSABLE) ×3 IMPLANT
YANKAUER SUCT BULB TIP 10FT TU (MISCELLANEOUS) ×3 IMPLANT

## 2013-03-15 NOTE — H&P (Signed)
Cody [name] is an 79 y.o. male.   Chief Complaint:   Here for elective surgery HPI: He has a symptomatic right inguinal hernia and presents for repair.  Past Medical History  Diagnosis Date  . BPH (benign prostatic hyperplasia)   . Hypertension   . Diverticulosis   . Melanoma     left forearm  . Left carotid bruit   . Osteopenia   . Hyperlipidemia   . History of colon polyps   . Plantar fibromatosis     right foot  . Basal cell carcinoma     left forearm  . Inguinal hernia   . Stroke 3/07    affected balance  . GERD (gastroesophageal reflux disease)     occurs rarely  . Anginal pain     nuclear stress 03/12/13 EPIC    Past Surgical History  Procedure Laterality Date  . Colonoscopy  2008  . Left forearm melanoma    . Prostate biopsy    . Eye surgery Right     cataract extraction with IOL    Family History  Problem Relation Age of Onset  . Cancer Mother     lung and breast  . Heart disease Father     MI  . Cancer Sister     pancreatic   Social History:  reports that he has quit smoking. He has never used smokeless tobacco. He reports that  drinks alcohol. He reports that he does not use illicit drugs.  Allergies:  Allergies  Allergen Reactions  . Aspirin   . Eggs Or Egg-Derived Products Swelling    throat    Medications Prior to Admission  Medication Sig Dispense Refill  . Cholecalciferol (VITAMIN D) 400 UNITS capsule Take 400 Units by mouth 2 (two) times daily.      . digoxin (LANOXIN) 0.25 MG tablet Take 0.25 mg by mouth every morning.       . dutasteride (AVODART) 0.5 MG capsule Take 0.5 mg by mouth every other day.       . hydrochlorothiazide (HYDRODIURIL) 25 MG tablet Take 12.5 mg by mouth every evening.       . pravastatin (PRAVACHOL) 20 MG tablet Take 20 mg by mouth every evening.       . clopidogrel (PLAVIX) 75 MG tablet Take 75 mg by mouth every morning. States stopped 5 days pre op      . NITROSTAT 0.4 MG SL tablet Place 0.4 mg under the tongue  every 5 (five) minutes as needed for chest pain.       . polyethylene glycol (MIRALAX / GLYCOLAX) packet Take 17 g by mouth every other day.        Results for orders placed during the hospital encounter of 03/13/13 (from the past 48 hour(s))  CBC WITH DIFFERENTIAL     Status: None   Collection Time    03/13/13  2:30 PM      Result Value Range   WBC 4.1  4.0 - 10.5 K/uL   RBC 4.66  4.22 - 5.81 MIL/uL   Hemoglobin 14.4  13.0 - 17.0 g/dL   HCT 21.3  08.6 - 57.8 %   MCV 92.5  78.0 - 100.0 fL   MCH 30.9  26.0 - 34.0 pg   MCHC 33.4  30.0 - 36.0 g/dL   RDW 46.9  62.9 - 52.8 %   Platelets 189  150 - 400 K/uL   Neutrophils Relative % 56  43 - 77 %  Neutro Abs 2.3  1.7 - 7.7 K/uL   Lymphocytes Relative 31  12 - 46 %   Lymphs Abs 1.3  0.7 - 4.0 K/uL   Monocytes Relative 6  3 - 12 %   Monocytes Absolute 0.3  0.1 - 1.0 K/uL   Eosinophils Relative 5  0 - 5 %   Eosinophils Absolute 0.2  0.0 - 0.7 K/uL   Basophils Relative 1  0 - 1 %   Basophils Absolute 0.0  0.0 - 0.1 K/uL  COMPREHENSIVE METABOLIC PANEL     Status: Abnormal   Collection Time    03/13/13  2:30 PM      Result Value Range   Sodium 136  135 - 145 mEq/L   Potassium 4.4  3.5 - 5.1 mEq/L   Chloride 99  96 - 112 mEq/L   CO2 32  19 - 32 mEq/L   Glucose, Bld 96  70 - 99 mg/dL   BUN 22  6 - 23 mg/dL   Creatinine, Ser 1.61  0.50 - 1.35 mg/dL   Calcium 09.6  8.4 - 04.5 mg/dL   Total Protein 7.1  6.0 - 8.3 g/dL   Albumin 3.7  3.5 - 5.2 g/dL   AST 18  0 - 37 U/L   ALT 11  0 - 53 U/L   Alkaline Phosphatase 70  39 - 117 U/L   Total Bilirubin 0.5  0.3 - 1.2 mg/dL   GFR calc non Af Amer 74 (*) >90 mL/min   GFR calc Af Amer 86 (*) >90 mL/min   Comment: (NOTE)     The eGFR has been calculated using the CKD EPI equation.     This calculation has not been validated in all clinical situations.     eGFR's persistently <90 mL/min signify possible Chronic Kidney     Disease.  PROTIME-INR     Status: None   Collection Time    03/13/13   2:30 PM      Result Value Range   Prothrombin Time 13.2  11.6 - 15.2 seconds   INR 1.02  0.00 - 1.49   Chest 2 View  03/13/2013   *RADIOLOGY REPORT*  Clinical Data: Hypertension  CHEST - 2 VIEW  Comparison: None.  Findings: Cardiomediastinal silhouette appears normal. Hyperexpansion of the lungs is noted suggesting chronic obstructive pulmonary disease.  No acute pulmonary disease is noted.  No pleural effusion or pneumothorax is noted.  Surgical clips are noted than left axillary region.  IMPRESSION: No acute cardiopulmonary abnormality seen.   Original Report Authenticated By: Lupita Raider.,  M.D.    Review of Systems  Constitutional: Negative for fever and chills.  Gastrointestinal: Negative for nausea, vomiting, abdominal pain and diarrhea.    Blood pressure 153/71, pulse 62, temperature 97.5 F (36.4 C), temperature source Oral, resp. rate 16, SpO2 100.00%. Physical Exam  Constitutional:  Thin, elderly male.  HENT:  Head: Normocephalic and atraumatic.  Cardiovascular: Normal rate and regular rhythm.   Respiratory: Effort normal and breath sounds normal.  GI: Soft. There is no tenderness.  Genitourinary:  Right inguinal bulge.  Neurological: He is alert.  Skin: Skin is warm and dry.     Assessment/Plan Right inguinal hernia.  Plan:  Right inguinal hernia repair.  Gilliam Hawkes J 03/15/2013, 1:15 PM

## 2013-03-15 NOTE — Op Note (Signed)
Preoperative diagnosis:  Right inguinal hernia.  Postoperative diagnosis:  Same ([place] hernia)  Procedure:  Right hernia repair with mesh.  Surgeon:  Avel Peace, M.D.  Anesthesia:  General/LMA with local (Marcaine).  Indication:  This is an 79 year old man who has developed a symptomatic right inguinal hernia. He lives independently at home. He now presents for repair. He has held his Plavix the appropriate amount of time.  Technique:  He was seen in the holding room and the right groin was marked with my initials. He was brought to the operating, placed supine on the operating table, and the anesthetic was administered by the anesthesiologist. The hair in the groin area was clipped as was felt to be necessary. This area was then sterilely prepped and draped.  Local anesthetic was infiltrated in the superficial and deep tissues in the right groin.  An incision was made through the skin and subcutaneous tissue until the external oblique aponeurosis was identified.  Local anesthetic was infiltrated deep to the external oblique aponeurosis. The external oblique aponeurosis was divided through the external ring medially and back toward the anterior superior iliac spine laterally. Using blunt dissection, the shelving edge of the inguinal ligament was identified inferiorly and the internal oblique aponeurosis and muscle were identified superiorly. The ilioinguinal nerve was identified and preserved.  The spermatic cord was isolated and a posterior window was made around it. An indirect and direct hernia were noted. The indirect hernia sac was dissected free from the spermatic cord and reduced. Extraperitoneal fat that was coming through the hernia was excised and sent to pathology.   A piece of 3" x 6" polypropylene mesh was brought into the field and anchored 1-2 cm medial to the pubic tubercle with 2-0 Prolene suture. The inferior aspect of the mesh was anchored to the shelving edge of the  inguinal ligament with running 2-0 Prolene suture to a level 1-2 cm lateral to the internal ring. A slit was cut in the mesh creating 2 tails. These were wrapped around the spermatic cord. The superior aspect of the mesh was anchored to the internal oblique aponeurosis and muscle with interrupted 2-0 Vicryl sutures. The 2 tails of the mesh were then crossed creating a new internal ring and were anchored to the shelving edge of the inguinal ligament with 2-0 Prolene suture. The tip of a hemostat could be placed through the new aperture. The lateral aspect of the mesh was then tucked deep to the external oblique aponeurosis.  The wound was inspected and hemostasis was adequate. The external oblique aponeurosis was then closed over the mesh and cord with running 3-0 Vicryl suture. The subcutaneous tissue was closed with running 3-0 Vicryl suture. The skin closed with a running 4-0 Monocryl subcuticular stitch.  Steri-Strips and a sterile dressing were applied.  The procedure was well-tolerated without any apparent complications and the patient was taken to the recovery room in satisfactory condition.

## 2013-03-15 NOTE — Progress Notes (Signed)
Patient c/o feeling of urinary retention. Relayed he has not been able to void with c/o pain in rt lower quad abd. Bladder scan showed 547cc urine in bladder. Patient acceptable to I&O cath per sterile technique. Output 500cc clear yellow urine without odor. Patient relay relief of abd pain/discomfort during cath. Explained he is to void on own within 6 hours and to notify nurse if feel this abd discomfort again.

## 2013-03-15 NOTE — Anesthesia Preprocedure Evaluation (Addendum)
Anesthesia Evaluation  Patient identified by MRN, date of birth, ID band Patient awake    Reviewed: Allergy & Precautions, H&P , NPO status , Patient's Chart, lab work & pertinent test results  Airway Mallampati: II TM Distance: >3 FB Neck ROM: Full    Dental no notable dental hx.    Pulmonary neg pulmonary ROS, former smoker,  breath sounds clear to auscultation  Pulmonary exam normal       Cardiovascular hypertension, Pt. on medications + angina negative cardio ROS  Rhythm:Regular Rate:Normal  Neg stress test 8/14    Neuro/Psych CVA, Residual Symptoms negative neurological ROS  negative psych ROS   GI/Hepatic negative GI ROS, Neg liver ROS,   Endo/Other  negative endocrine ROS  Renal/GU negative Renal ROS  negative genitourinary   Musculoskeletal negative musculoskeletal ROS (+)   Abdominal   Peds negative pediatric ROS (+)  Hematology negative hematology ROS (+)   Anesthesia Other Findings   Reproductive/Obstetrics negative OB ROS                          Anesthesia Physical Anesthesia Plan  ASA: III  Anesthesia Plan: General   Post-op Pain Management:    Induction: Intravenous  Airway Management Planned: LMA  Additional Equipment:   Intra-op Plan:   Post-operative Plan: Extubation in OR  Informed Consent: I have reviewed the patients History and Physical, chart, labs and discussed the procedure including the risks, benefits and alternatives for the proposed anesthesia with the patient or authorized representative who has indicated his/her understanding and acceptance.   Dental advisory given  Plan Discussed with: CRNA  Anesthesia Plan Comments:         Anesthesia Quick Evaluation

## 2013-03-15 NOTE — Transfer of Care (Signed)
Immediate Anesthesia Transfer of Care Note  Patient: Cody [name]  Procedure(s) Performed: Procedure(s): HERNIA REPAIR INGUINAL ADULT (Right) INSERTION OF MESH (Right)  Patient Location: PACU  Anesthesia Type:General  Level of Consciousness: sedated  Airway & Oxygen Therapy: Patient Spontanous Breathing and Patient connected to face mask oxygen  Post-op Assessment: Report given to PACU RN and Post -op Vital signs reviewed and stable  Post vital signs: Reviewed and stable  Complications: No apparent anesthesia complications

## 2013-03-15 NOTE — Anesthesia Postprocedure Evaluation (Signed)
  Anesthesia Post-op Note  Patient: Cody [name]  Procedure(s) Performed: Procedure(s) (LRB): HERNIA REPAIR INGUINAL ADULT (Right) INSERTION OF MESH (Right)  Patient Location: PACU  Anesthesia Type: General  Level of Consciousness: awake and alert   Airway and Oxygen Therapy: Patient Spontanous Breathing  Post-op Pain: mild  Post-op Assessment: Post-op Vital signs reviewed, Patient's Cardiovascular Status Stable, Respiratory Function Stable, Patent Airway and No signs of Nausea or vomiting  Last Vitals:  Filed Vitals:   03/15/13 1510  BP: 155/71  Pulse: 66  Temp:   Resp: 14    Post-op Vital Signs: stable   Complications: No apparent anesthesia complications

## 2013-03-16 ENCOUNTER — Encounter (HOSPITAL_COMMUNITY): Payer: Self-pay | Admitting: General Surgery

## 2013-03-16 MED ORDER — HYDROCODONE-ACETAMINOPHEN 5-325 MG PO TABS: 1.0000 | ORAL_TABLET | ORAL | Status: DC | PRN

## 2013-03-16 NOTE — Progress Notes (Signed)
1 Day Post-Op  Subjective: Had one episode of urinary retention which required an I & O cath which was traumatic.  Has been having some hematuria since that time.  Objective: Vital signs in last 24 hours: Temp:  [97.4 F (36.3 C)-98.8 F (37.1 C)] 97.5 F (36.4 C) (09/19 0705) Pulse Rate:  [51-83] 66 (09/19 0705) Resp:  [9-16] 16 (09/19 0705) BP: (98-159)/(58-72) 98/63 mmHg (09/19 0705) SpO2:  [96 %-100 %] 97 % (09/19 0705) Weight:  [143 lb (64.864 kg)] 143 lb (64.864 kg) (09/18 1528) Last BM Date: 03/14/13  Intake/Output from previous day: 09/18 0701 - 09/19 0700 In: 2127 [P.O.:840; I.V.:1287] Out: 1250 [Urine:1250] Intake/Output this shift:    PE: General- In NAD CV-RRR Abdomen-soft,nontender GU-Right inguinal dressing is dry with minimal swelling.  Lab Results:   Recent Labs  03/13/13 1430  WBC 4.1  HGB 14.4  HCT 43.1  PLT 189   BMET  Recent Labs  03/13/13 1430  NA 136  K 4.4  CL 99  CO2 32  GLUCOSE 96  BUN 22  CREATININE 0.95  CALCIUM 10.0   PT/INR  Recent Labs  03/13/13 1430  LABPROT 13.2  INR 1.02   Comprehensive Metabolic Panel:    Component Value Date/Time   NA 136 03/13/2013 1430   K 4.4 03/13/2013 1430   CL 99 03/13/2013 1430   CO2 32 03/13/2013 1430   BUN 22 03/13/2013 1430   CREATININE 0.95 03/13/2013 1430   GLUCOSE 96 03/13/2013 1430   CALCIUM 10.0 03/13/2013 1430   AST 18 03/13/2013 1430   ALT 11 03/13/2013 1430   ALKPHOS 70 03/13/2013 1430   BILITOT 0.5 03/13/2013 1430   PROT 7.1 03/13/2013 1430   ALBUMIN 3.7 03/13/2013 1430     Studies/Results: No results found.  Anti-infectives: Anti-infectives   Start     Dose/Rate Route Frequency Ordered Stop   03/15/13 0842  ceFAZolin (ANCEF) IVPB 2 g/50 mL premix     2 g 100 mL/hr over 30 Minutes Intravenous On call to O.R. 03/15/13 0842 03/15/13 1330      Assessment Active Problems:   Inguinal hernia without mention of obstruction or gangrene, unilateral or unspecified, (not  specified as recurrent)-right s/p repair with mesh 03/15/13-some transient urinary retention; has some hematuria after I & O cath.      LOS: 1 day   Plan: Stop Heparin.  Observe to make sure hematuria is improving.  Home later today or tomorrow.  Discharge instructions discussed with him.   Nataliya Graig J 03/16/2013

## 2013-03-16 NOTE — Progress Notes (Signed)
Pt voided 100cc's clear yellow urine. Pt states that it still "burned a little", but there was no blood in his urine and no clots were noticed. Will continue to monitor.

## 2013-03-16 NOTE — Progress Notes (Signed)
Patient had urge to urinate. Relayed his urine started out yellow then he passed a clot. Approximately 50cc bloody urine emptied from urinal. Patient denied difficulty with voiding and discomfort also denied at this time. 16 french I&O cath was inserted earlier with ease with return of clear yellow urine.

## 2013-03-16 NOTE — Progress Notes (Signed)
Nutrition Brief Note  Patient identified for being underweight.   Wt Readings from Last 15 Encounters:  03/15/13 143 lb (64.864 kg)  03/15/13 143 lb (64.864 kg)  03/13/13 143 lb (64.864 kg)  03/12/13 144 lb (65.318 kg)  01/30/13 145 lb 12.5 oz (66.125 kg)  10/14/09 152 lb 8 oz (69.174 kg)    Body mass index is 18.35 kg/(m^2). Patient meets criteria for underweight based on current BMI.   Current diet order is regular, patient is consuming approximately 100% of meals at this time. Labs and medications reviewed. Pt reports good appetite PTA however reports he has been thin all of his life. Pt had questions on ways to gain weight - discussed high calorie/protein diet and nutritional supplementation - provided handouts of this information.   No nutrition interventions warranted at this time. If nutrition issues arise, please consult RD.   Levon Hedger MS, RD, LDN [phone] Pager [phone] After Hours Pager

## 2013-03-16 NOTE — Plan of Care (Signed)
Problem: Consults Goal: Nutrition Consult-if indicated Nutrition consult completed

## 2013-03-16 NOTE — Progress Notes (Signed)
Discharge instructions explained to patient.  Copy of instructions given to patient along w/ prescription for Vicodin.  Pt verbalized understanding instructions.

## 2013-03-19 ENCOUNTER — Telehealth: Payer: Self-pay | Admitting: Cardiology

## 2013-03-19 NOTE — Telephone Encounter (Signed)
New Problem  Pt returning a call about a stress test.

## 2013-03-19 NOTE — Discharge Summary (Signed)
Physician Discharge Summary  Patient ID: Cody [name] MRN: [id number] DOB/AGE: Jan 16, 1925 79 y.o.  Admit date: 03/15/2013 Discharge date: 03/16/2013  Admission Diagnoses:  Right inguinal hernia  Discharge Diagnoses:  Right inguinal hernia Postoperative urinary retention Transient hematuria   Discharged Condition: good  Hospital Course: he underwent right inguinal hernia repair with mesh 03/15/2013. Postoperatively he had some urinary retention. In out catheterization was performed but he had some hematuria following the catheterization. He was on subcutaneous heparin and this was stopped. Over the course of his first postoperative day the hematuria improved and he was able to void well. He was able to be discharged. Discharge instructions were given to him.  Consults: None  Significant Diagnostic Studies: none  Treatments: surgery: right inguinal hernia repair with mesh  Discharge Exam: Blood pressure 131/83, pulse 67, temperature 98.7 F (37.1 C), temperature source Oral, resp. rate 15, height 6\' 2"  (1.88 m), weight 143 lb (64.864 kg), SpO2 99.00%.   Disposition: 01-Home or Self Care     Medication List    STOP taking these medications       clopidogrel 75 MG tablet  Commonly known as:  PLAVIX      TAKE these medications       digoxin 0.25 MG tablet  Commonly known as:  LANOXIN  Take 0.25 mg by mouth every morning.     dutasteride 0.5 MG capsule  Commonly known as:  AVODART  Take 0.5 mg by mouth every other day.     hydrochlorothiazide 25 MG tablet  Commonly known as:  HYDRODIURIL  Take 12.5 mg by mouth every evening.     HYDROcodone-acetaminophen 5-325 MG per tablet  Commonly known as:  NORCO/VICODIN  Take 1-2 tablets by mouth every 4 (four) hours as needed.     NITROSTAT 0.4 MG SL tablet  Generic drug:  nitroGLYCERIN  Place 0.4 mg under the tongue every 5 (five) minutes as needed for chest pain.     polyethylene glycol packet  Commonly known as:   MIRALAX / GLYCOLAX  Take 17 g by mouth every other day.     pravastatin 20 MG tablet  Commonly known as:  PRAVACHOL  Take 20 mg by mouth every evening.     Vitamin D 400 UNITS capsule  Take 400 Units by mouth 2 (two) times daily.         Signed: Adolph Pollack 03/19/2013, 6:01 AM

## 2013-03-19 NOTE — Telephone Encounter (Signed)
Spoke to patient myoview results given.Patient stated he had inguinal hernia surgery 03/16/13.

## 2013-03-22 ENCOUNTER — Telehealth (INDEPENDENT_AMBULATORY_CARE_PROVIDER_SITE_OTHER): Payer: Self-pay | Admitting: General Surgery

## 2013-03-22 NOTE — Telephone Encounter (Signed)
Pt called to make his follow-up appt for inguinal hernia repair (done.)  Also had a question about swelling.  Recommended he elevate his scrotum and penis with small towel to promote drainage of the excessive fluid back towards his abdomen for reabsorption.  He understands and will try this.

## 2013-03-30 ENCOUNTER — Telehealth: Payer: Self-pay

## 2013-03-30 NOTE — Telephone Encounter (Signed)
Spoke with patient Dr.Jordan advised needs to see cardiology due to 03/12/13 myoview showing evidence of inferolateral infarct.Patient stated he has decided not to see cardiology at this time.

## 2013-03-30 NOTE — Telephone Encounter (Signed)
See previous 03/30/13 note.

## 2013-03-30 NOTE — Telephone Encounter (Signed)
See previous 03/30/13 note. 

## 2013-04-13 ENCOUNTER — Encounter (INDEPENDENT_AMBULATORY_CARE_PROVIDER_SITE_OTHER): Payer: Self-pay | Admitting: General Surgery

## 2013-04-13 ENCOUNTER — Ambulatory Visit (INDEPENDENT_AMBULATORY_CARE_PROVIDER_SITE_OTHER): Payer: Medicare Other | Admitting: General Surgery

## 2013-04-13 VITALS — BP 140/76 | HR 68 | Resp 14 | Ht 74.0 in | Wt 142.4 lb

## 2013-04-13 DIAGNOSIS — Z4889 Encounter for other specified surgical aftercare: Secondary | ICD-10-CM

## 2013-04-13 NOTE — Patient Instructions (Signed)
6 weeks after the date of surgery, resume normal activities as tolerated, as we discussed.

## 2013-04-13 NOTE — Progress Notes (Signed)
Hepresents for postop followup after open right inguinal hernia repair with mesh.  Post op pain is improving.  No difficulty voiding or having BMs.  Swelling is decreasing.  He had some hematuria in the hospital after a traumatic in and out catheterization. He's not had anything like that at home. He has had 2 episodes of noticing a little bit of blood on his underwear.  P.E.  GU:  Right incision clean/dry/intact, swelling is minimal, repair is solid.  Assessment:  Doing well post hernia repair.  Plan:  Continue light activities for 6 weeks postop then slowly start to resume normal activities.  Call if he is having some blood per penis 6 weeks from now. Return visit when necessary.

## 2013-07-23 ENCOUNTER — Other Ambulatory Visit: Payer: Self-pay | Admitting: Dermatology

## 2013-07-23 DIAGNOSIS — L821 Other seborrheic keratosis: Secondary | ICD-10-CM | POA: Diagnosis not present

## 2013-07-23 DIAGNOSIS — C44320 Squamous cell carcinoma of skin of unspecified parts of face: Secondary | ICD-10-CM | POA: Diagnosis not present

## 2013-07-23 DIAGNOSIS — L570 Actinic keratosis: Secondary | ICD-10-CM | POA: Diagnosis not present

## 2013-07-23 DIAGNOSIS — Z85820 Personal history of malignant melanoma of skin: Secondary | ICD-10-CM | POA: Diagnosis not present

## 2013-07-23 DIAGNOSIS — Z85828 Personal history of other malignant neoplasm of skin: Secondary | ICD-10-CM | POA: Diagnosis not present

## 2013-07-23 DIAGNOSIS — D485 Neoplasm of uncertain behavior of skin: Secondary | ICD-10-CM | POA: Diagnosis not present

## 2013-09-18 ENCOUNTER — Other Ambulatory Visit: Payer: Self-pay | Admitting: Dermatology

## 2013-09-18 DIAGNOSIS — Z85820 Personal history of malignant melanoma of skin: Secondary | ICD-10-CM | POA: Diagnosis not present

## 2013-09-18 DIAGNOSIS — D485 Neoplasm of uncertain behavior of skin: Secondary | ICD-10-CM | POA: Diagnosis not present

## 2013-09-18 DIAGNOSIS — L570 Actinic keratosis: Secondary | ICD-10-CM | POA: Diagnosis not present

## 2013-09-18 DIAGNOSIS — Q828 Other specified congenital malformations of skin: Secondary | ICD-10-CM | POA: Diagnosis not present

## 2013-09-18 DIAGNOSIS — D0420 Carcinoma in situ of skin of unspecified ear and external auricular canal: Secondary | ICD-10-CM | POA: Diagnosis not present

## 2013-09-18 DIAGNOSIS — C44721 Squamous cell carcinoma of skin of unspecified lower limb, including hip: Secondary | ICD-10-CM | POA: Diagnosis not present

## 2013-09-18 DIAGNOSIS — Z85828 Personal history of other malignant neoplasm of skin: Secondary | ICD-10-CM | POA: Diagnosis not present

## 2013-10-05 DIAGNOSIS — R609 Edema, unspecified: Secondary | ICD-10-CM | POA: Diagnosis not present

## 2013-10-05 DIAGNOSIS — Z681 Body mass index (BMI) 19 or less, adult: Secondary | ICD-10-CM | POA: Diagnosis not present

## 2013-10-05 DIAGNOSIS — I4891 Unspecified atrial fibrillation: Secondary | ICD-10-CM | POA: Diagnosis not present

## 2013-10-05 DIAGNOSIS — I6529 Occlusion and stenosis of unspecified carotid artery: Secondary | ICD-10-CM | POA: Diagnosis not present

## 2013-10-22 DIAGNOSIS — Z961 Presence of intraocular lens: Secondary | ICD-10-CM | POA: Diagnosis not present

## 2013-10-22 DIAGNOSIS — H524 Presbyopia: Secondary | ICD-10-CM | POA: Diagnosis not present

## 2013-10-22 DIAGNOSIS — H01009 Unspecified blepharitis unspecified eye, unspecified eyelid: Secondary | ICD-10-CM | POA: Diagnosis not present

## 2013-10-22 DIAGNOSIS — H2510 Age-related nuclear cataract, unspecified eye: Secondary | ICD-10-CM | POA: Diagnosis not present

## 2013-10-22 DIAGNOSIS — H52209 Unspecified astigmatism, unspecified eye: Secondary | ICD-10-CM | POA: Diagnosis not present

## 2013-10-22 DIAGNOSIS — H35379 Puckering of macula, unspecified eye: Secondary | ICD-10-CM | POA: Diagnosis not present

## 2013-10-22 DIAGNOSIS — H5210 Myopia, unspecified eye: Secondary | ICD-10-CM | POA: Diagnosis not present

## 2013-11-16 DIAGNOSIS — Z125 Encounter for screening for malignant neoplasm of prostate: Secondary | ICD-10-CM | POA: Diagnosis not present

## 2013-11-16 DIAGNOSIS — I10 Essential (primary) hypertension: Secondary | ICD-10-CM | POA: Diagnosis not present

## 2013-11-16 DIAGNOSIS — R82998 Other abnormal findings in urine: Secondary | ICD-10-CM | POA: Diagnosis not present

## 2013-11-16 DIAGNOSIS — I4891 Unspecified atrial fibrillation: Secondary | ICD-10-CM | POA: Diagnosis not present

## 2013-11-16 DIAGNOSIS — M899 Disorder of bone, unspecified: Secondary | ICD-10-CM | POA: Diagnosis not present

## 2013-11-16 DIAGNOSIS — E785 Hyperlipidemia, unspecified: Secondary | ICD-10-CM | POA: Diagnosis not present

## 2013-11-22 DIAGNOSIS — E785 Hyperlipidemia, unspecified: Secondary | ICD-10-CM | POA: Diagnosis not present

## 2013-11-22 DIAGNOSIS — M899 Disorder of bone, unspecified: Secondary | ICD-10-CM | POA: Diagnosis not present

## 2013-11-22 DIAGNOSIS — I6529 Occlusion and stenosis of unspecified carotid artery: Secondary | ICD-10-CM | POA: Diagnosis not present

## 2013-11-22 DIAGNOSIS — I4891 Unspecified atrial fibrillation: Secondary | ICD-10-CM | POA: Diagnosis not present

## 2013-11-22 DIAGNOSIS — M949 Disorder of cartilage, unspecified: Secondary | ICD-10-CM | POA: Diagnosis not present

## 2013-11-22 DIAGNOSIS — R0989 Other specified symptoms and signs involving the circulatory and respiratory systems: Secondary | ICD-10-CM | POA: Diagnosis not present

## 2013-11-22 DIAGNOSIS — R609 Edema, unspecified: Secondary | ICD-10-CM | POA: Diagnosis not present

## 2013-11-22 DIAGNOSIS — I10 Essential (primary) hypertension: Secondary | ICD-10-CM | POA: Diagnosis not present

## 2013-11-22 DIAGNOSIS — Z0000 Encounter for general adult medical examination without abnormal findings: Secondary | ICD-10-CM | POA: Diagnosis not present

## 2013-11-22 DIAGNOSIS — Z1331 Encounter for screening for depression: Secondary | ICD-10-CM | POA: Diagnosis not present

## 2013-11-22 DIAGNOSIS — Z23 Encounter for immunization: Secondary | ICD-10-CM | POA: Diagnosis not present

## 2013-11-26 DIAGNOSIS — Z1212 Encounter for screening for malignant neoplasm of rectum: Secondary | ICD-10-CM | POA: Diagnosis not present

## 2013-11-28 ENCOUNTER — Other Ambulatory Visit (HOSPITAL_COMMUNITY): Payer: Medicare Other

## 2013-12-06 ENCOUNTER — Ambulatory Visit (HOSPITAL_COMMUNITY)
Admission: RE | Admit: 2013-12-06 | Discharge: 2013-12-06 | Disposition: A | Discharge status: Home / Self Care | Payer: Medicare Other | Source: Ambulatory Visit | Attending: Vascular Surgery | Admitting: Vascular Surgery

## 2013-12-06 ENCOUNTER — Other Ambulatory Visit (HOSPITAL_COMMUNITY): Payer: Self-pay | Admitting: Internal Medicine

## 2013-12-06 DIAGNOSIS — I6529 Occlusion and stenosis of unspecified carotid artery: Secondary | ICD-10-CM

## 2014-01-23 ENCOUNTER — Other Ambulatory Visit: Payer: Self-pay | Admitting: Dermatology

## 2014-01-23 DIAGNOSIS — C44320 Squamous cell carcinoma of skin of unspecified parts of face: Secondary | ICD-10-CM | POA: Diagnosis not present

## 2014-01-23 DIAGNOSIS — D485 Neoplasm of uncertain behavior of skin: Secondary | ICD-10-CM | POA: Diagnosis not present

## 2014-01-23 DIAGNOSIS — L570 Actinic keratosis: Secondary | ICD-10-CM | POA: Diagnosis not present

## 2014-01-23 DIAGNOSIS — C4442 Squamous cell carcinoma of skin of scalp and neck: Secondary | ICD-10-CM | POA: Diagnosis not present

## 2014-01-23 DIAGNOSIS — Z85820 Personal history of malignant melanoma of skin: Secondary | ICD-10-CM | POA: Diagnosis not present

## 2014-01-23 DIAGNOSIS — Z85828 Personal history of other malignant neoplasm of skin: Secondary | ICD-10-CM | POA: Diagnosis not present

## 2014-04-10 ENCOUNTER — Encounter: Payer: Self-pay | Admitting: Gastroenterology

## 2014-04-22 DIAGNOSIS — H35371 Puckering of macula, right eye: Secondary | ICD-10-CM | POA: Diagnosis not present

## 2014-04-22 DIAGNOSIS — H25812 Combined forms of age-related cataract, left eye: Secondary | ICD-10-CM | POA: Diagnosis not present

## 2014-04-22 DIAGNOSIS — H3561 Retinal hemorrhage, right eye: Secondary | ICD-10-CM | POA: Diagnosis not present

## 2014-04-24 DIAGNOSIS — H3560 Retinal hemorrhage, unspecified eye: Secondary | ICD-10-CM

## 2014-04-24 DIAGNOSIS — H35379 Puckering of macula, unspecified eye: Secondary | ICD-10-CM | POA: Insufficient documentation

## 2014-04-24 HISTORY — DX: Retinal hemorrhage, unspecified eye: H35.60

## 2014-04-24 HISTORY — DX: Puckering of macula, unspecified eye: H35.379

## 2014-06-04 DIAGNOSIS — Z85828 Personal history of other malignant neoplasm of skin: Secondary | ICD-10-CM | POA: Diagnosis not present

## 2014-06-04 DIAGNOSIS — L570 Actinic keratosis: Secondary | ICD-10-CM | POA: Diagnosis not present

## 2014-06-04 DIAGNOSIS — L821 Other seborrheic keratosis: Secondary | ICD-10-CM | POA: Diagnosis not present

## 2014-06-04 DIAGNOSIS — Z85820 Personal history of malignant melanoma of skin: Secondary | ICD-10-CM | POA: Diagnosis not present

## 2014-06-18 ENCOUNTER — Ambulatory Visit: Payer: Self-pay | Admitting: Podiatry

## 2014-07-03 ENCOUNTER — Ambulatory Visit (INDEPENDENT_AMBULATORY_CARE_PROVIDER_SITE_OTHER): Payer: Medicare Other | Admitting: Podiatry

## 2014-07-03 ENCOUNTER — Encounter: Payer: Self-pay | Admitting: Podiatry

## 2014-07-03 VITALS — BP 136/59 | HR 68 | Resp 18

## 2014-07-03 DIAGNOSIS — M79676 Pain in unspecified toe(s): Secondary | ICD-10-CM

## 2014-07-03 DIAGNOSIS — B351 Tinea unguium: Secondary | ICD-10-CM | POA: Diagnosis not present

## 2014-07-03 DIAGNOSIS — L84 Corns and callosities: Secondary | ICD-10-CM | POA: Diagnosis not present

## 2014-07-03 NOTE — Progress Notes (Signed)
   Subjective:    Patient ID: Cody [name], male    DOB: 02/17/1925, 81 y.o.   MRN: [id number]  HPI  81 year old male presents the office today with complaints of painful elongated toenails as well as symptomatic calluses on the bottom of his right foot. He states that he started to develop these lesions after he had a stroke. He also states that he has an area on the side of his right foot and ankle all that he does not want these areas trimmed at today's appointment. He denies any recent ulcerations or any signs of infection around the lesions or nails. He also states that he's been told he has venous insufficiency and vascular issues and he has previously been seen by vascular surgeon. He states that his feet are chronically red. He denies any tingling or numbness to his feet or any significant pain other than to the lesions on the bottom of his right foot and to the toenails. No other complaints.     Review of Systems  All other systems reviewed and are negative.      Objective:   Physical Exam AAO x3, NAD DP/PT pulses decreased bilaterally, CRT is delayed. There is chronic appearing rubor to bilateral lower extremities.  Decreased protective sensation with SWMF, decreased vibratory sensation, decreased Achilles tendon reflex.  Nailshypertrophic, dystrophic, elongated, brittle, discolored  X10. No surrounding erythema or drainage from around the nail sites. On the right lower extremity there are hyperkeratotic lesion submetatarsal 1 and 4. Upon debridement there is no underlying ulcerations or clinical signs of infection. There is also a small hyperkeratotic lesion on the lateral aspect of the right foot on the fifth metatarsal base and the lateral malleolus. The patient does not want these areas debrided. There is not appear to be any clinical signs of infection at this time. No other open lesions or pre-ulcerative lesions. No interdigital maceration. No areas of pinpoint bony tenderness  or pain with vibratory sensation. No pain with calf compression, swelling, warmth, erythema.       Assessment & Plan:  81 year old male with symptomatic onychomycosis, symptomatic hyperkeratotic lesions -Treatment options were discussed the patient including alternatives, risks, complications -Nail sharply debrided 10 without complications or any iatrogenic bleeding. -Hyperkeratotic lesion sharply debrided without complications 2. Patient did not want the lateral fifth metatarsal base or lateral malleolus hyperkeratotic lesion debrided. -Discussed supportive shoe -Discussed the importance of daily foot inspection. -Continue to wear offloading pads -Follow-up in 3 months or sooner should any problems arise. In the meantime, encouraged to call the office with any questions, concerns, change in symptoms. Follow-up with PCP for other issues mentioned and review of systems.

## 2014-08-08 DIAGNOSIS — H3561 Retinal hemorrhage, right eye: Secondary | ICD-10-CM | POA: Diagnosis not present

## 2014-08-08 DIAGNOSIS — IMO0002 Reserved for concepts with insufficient information to code with codable children: Secondary | ICD-10-CM

## 2014-08-08 DIAGNOSIS — H2512 Age-related nuclear cataract, left eye: Secondary | ICD-10-CM | POA: Diagnosis not present

## 2014-08-08 DIAGNOSIS — Z9889 Other specified postprocedural states: Secondary | ICD-10-CM | POA: Insufficient documentation

## 2014-08-08 DIAGNOSIS — H35371 Puckering of macula, right eye: Secondary | ICD-10-CM | POA: Diagnosis not present

## 2014-08-08 DIAGNOSIS — Z961 Presence of intraocular lens: Secondary | ICD-10-CM | POA: Diagnosis not present

## 2014-08-08 HISTORY — DX: Reserved for concepts with insufficient information to code with codable children: IMO0002

## 2014-10-04 ENCOUNTER — Ambulatory Visit (INDEPENDENT_AMBULATORY_CARE_PROVIDER_SITE_OTHER): Payer: Medicare Other | Admitting: Podiatry

## 2014-10-04 ENCOUNTER — Encounter: Payer: Self-pay | Admitting: Podiatry

## 2014-10-04 VITALS — BP 149/88 | HR 68 | Resp 18

## 2014-10-04 DIAGNOSIS — B351 Tinea unguium: Secondary | ICD-10-CM | POA: Diagnosis not present

## 2014-10-04 DIAGNOSIS — M79676 Pain in unspecified toe(s): Secondary | ICD-10-CM | POA: Diagnosis not present

## 2014-10-07 ENCOUNTER — Encounter: Payer: Self-pay | Admitting: Podiatry

## 2014-10-07 NOTE — Progress Notes (Signed)
Patient ID: SAHEJ SCHRIEBER, male   DOB: 06/02/1925, 81 y.o.   MRN: [id number]  Subjective: 81 y.o.-year-old male returns the office today for painful, elongated, thickened toenails which he is unable to trim himself as well as for painful calluses. Denies any redness or drainage around the nails/calluses. Denies any acute changes since last appointment and no new complaints today. Denies any systemic complaints such as fevers, chills, nausea, vomiting.   Objective: AAO 3, NAD DP/PT pulses palpable, CRT less than 3 seconds Protective sensation intact with Simms Weinstein monofilament, Achilles tendon reflex intact.  Nails hypertrophic, dystrophic, elongated, brittle, discolored 10. There is tenderness overlying the nails 1-5 bilaterally. There is no surrounding erythema or drainage along the nail sites. Hyperkeratotic lesion right foot submetatarsal one and 4. Pondimin no underlying ulceration, drainage or other clinical signs of infection. No open lesions or other pre-ulcerative lesions are identified. No other areas of tenderness bilateral lower extremities. No overlying edema, erythema, increased warmth. No pain with calf compression, swelling, warmth, erythema.  Assessment: Patient presents with symptomatic onychomycosis, hyperkerotic lesions   Plan: -Treatment options including alternatives, risks, complications were discussed -Nails sharply debrided 10 without complication/bleeding. -Hyperkerotic lesions sharply debrided without complications/bleeding  -Discussed daily foot inspection. If there are any changes, to call the office immediately.  -Follow-up in 3 months or sooner if any problems are to arise. In the meantime, encouraged to call the office with any questions, concerns, changes symptoms.

## 2014-11-21 DIAGNOSIS — Z125 Encounter for screening for malignant neoplasm of prostate: Secondary | ICD-10-CM | POA: Diagnosis not present

## 2014-11-21 DIAGNOSIS — I10 Essential (primary) hypertension: Secondary | ICD-10-CM | POA: Diagnosis not present

## 2014-11-21 DIAGNOSIS — N390 Urinary tract infection, site not specified: Secondary | ICD-10-CM | POA: Diagnosis not present

## 2014-11-21 DIAGNOSIS — E785 Hyperlipidemia, unspecified: Secondary | ICD-10-CM | POA: Diagnosis not present

## 2014-11-21 DIAGNOSIS — R8299 Other abnormal findings in urine: Secondary | ICD-10-CM | POA: Diagnosis not present

## 2014-11-28 DIAGNOSIS — I6522 Occlusion and stenosis of left carotid artery: Secondary | ICD-10-CM | POA: Diagnosis not present

## 2014-11-28 DIAGNOSIS — I480 Paroxysmal atrial fibrillation: Secondary | ICD-10-CM | POA: Diagnosis not present

## 2014-11-28 DIAGNOSIS — Z681 Body mass index (BMI) 19 or less, adult: Secondary | ICD-10-CM | POA: Diagnosis not present

## 2014-11-28 DIAGNOSIS — E785 Hyperlipidemia, unspecified: Secondary | ICD-10-CM | POA: Diagnosis not present

## 2014-11-28 DIAGNOSIS — M859 Disorder of bone density and structure, unspecified: Secondary | ICD-10-CM | POA: Diagnosis not present

## 2014-11-28 DIAGNOSIS — C439 Malignant melanoma of skin, unspecified: Secondary | ICD-10-CM | POA: Diagnosis not present

## 2014-11-28 DIAGNOSIS — Z1389 Encounter for screening for other disorder: Secondary | ICD-10-CM | POA: Diagnosis not present

## 2014-11-28 DIAGNOSIS — Z0000 Encounter for general adult medical examination without abnormal findings: Secondary | ICD-10-CM | POA: Diagnosis not present

## 2014-11-28 DIAGNOSIS — I10 Essential (primary) hypertension: Secondary | ICD-10-CM | POA: Diagnosis not present

## 2014-11-29 DIAGNOSIS — Z1212 Encounter for screening for malignant neoplasm of rectum: Secondary | ICD-10-CM | POA: Diagnosis not present

## 2014-11-29 LAB — IFOBT (OCCULT BLOOD): IMMUNOLOGICAL FECAL OCCULT BLOOD TEST: POSITIVE

## 2015-01-10 ENCOUNTER — Ambulatory Visit: Payer: Medicare Other | Admitting: Podiatry

## 2015-02-07 ENCOUNTER — Ambulatory Visit (INDEPENDENT_AMBULATORY_CARE_PROVIDER_SITE_OTHER): Payer: Medicare Other | Admitting: Podiatry

## 2015-02-07 VITALS — BP 117/65 | HR 76 | Resp 17

## 2015-02-07 DIAGNOSIS — L84 Corns and callosities: Secondary | ICD-10-CM

## 2015-02-07 DIAGNOSIS — B351 Tinea unguium: Secondary | ICD-10-CM

## 2015-02-07 DIAGNOSIS — M79676 Pain in unspecified toe(s): Secondary | ICD-10-CM | POA: Diagnosis not present

## 2015-02-07 NOTE — Progress Notes (Signed)
Subjective:     Patient ID: Cody [name], male   DOB: 06-13-1925, 81 y.o.   MRN: [id number]  HPIThis patient presents to the office for preventive foot care services.  His nails have grown long and painful since his last visit.  He has painful callus under the ball of his right big toe  for which he wears a pad to help reduce the pain.     Review of Systems     Objective:   Physical Exam GENERAL APPEARANCE: Alert, conversant. Appropriately groomed. No acute distress.  VASCULAR: Pedal pulses are not palpable at  [facility] and PT bilateral.  Capillary refill time is  Diminished  to all digits,  Cold feet.  NEUROLOGIC: sensation is normal to 5.07 monofilament at 5/5 sites bilateral.  Light touch is intact bilateral, Muscle strength normal.  MUSCULOSKELETAL: acceptable muscle strength, tone and stability bilateral.  Intrinsic muscluature intact bilateral.  Rectus appearance of foot and digits noted bilateral.   DERMATOLOGIC: skin color, texture, and turgor are within normal limits.  No preulcerative lesions or ulcers  are seen, no interdigital maceration noted.  No open lesions present.  Digital nails are asymptomatic. No drainage noted.Porokeratosis sub 1st MPJ right foot.      Assessment:     Onychomycosis  Porokeratosis right foot     Plan:     Debridement of Nails  Debridement of porokeratosis   RTC 3 mos.

## 2015-02-18 ENCOUNTER — Telehealth: Payer: Self-pay

## 2015-02-18 ENCOUNTER — Encounter: Payer: Self-pay | Admitting: Gastroenterology

## 2015-02-18 ENCOUNTER — Ambulatory Visit (INDEPENDENT_AMBULATORY_CARE_PROVIDER_SITE_OTHER): Payer: Medicare Other | Admitting: Gastroenterology

## 2015-02-18 VITALS — BP 130/80 | HR 76 | Ht 74.0 in | Wt 133.8 lb

## 2015-02-18 DIAGNOSIS — Z7901 Long term (current) use of anticoagulants: Secondary | ICD-10-CM | POA: Diagnosis not present

## 2015-02-18 DIAGNOSIS — R195 Other fecal abnormalities: Secondary | ICD-10-CM | POA: Diagnosis not present

## 2015-02-18 MED ORDER — NA SULFATE-K SULFATE-MG SULF 17.5-3.13-1.6 GM/177ML PO SOLN: 1.0000 | Freq: Once | ORAL | Status: DC

## 2015-02-18 NOTE — Telephone Encounter (Signed)
  02/18/2015   RE: [name] DOB: 1925/04/05 MRN: [id number]   Dear Dr. Philip Aspen,    We have scheduled the above patient for an endoscopic procedure. Our records show that he is on anticoagulation therapy.   Please advise as to how long the patient may come off his therapy of Plavix prior to the procedure, which is scheduled for 04/16/15.  Please fax back/ or route the completed form to [place] at [phone].   Sincerely,    Marlon Pel, CMA Lucio Edward, MD

## 2015-02-18 NOTE — Patient Instructions (Signed)
You have been scheduled for a colonoscopy. Please follow written instructions given to you at your visit today.  Please pick up your prep supplies at the pharmacy within the next 1-3 days. If you use inhalers (even only as needed), please bring them with you on the day of your procedure.  Thank you for choosing me and [facility].  Pricilla Riffle. Dagoberto Ligas., MD., Marval Regal  cc: Leanna Battles, MD

## 2015-02-18 NOTE — Progress Notes (Signed)
    History of Present Illness: This is an 81 year old referred by Leanna Battles, MD for the evaluation of Hemosure positive stool. Was previously followed by Dr. Verl Blalock. He has a history of adenomatous polyp with high-grade dysplasia removed in 2007. His last colonoscopy was performed in 2011 and showed moderate diverticulosis and no polyps. He has had a slow gradual drop in his weight of about 10 pounds over the past 2-3 years. He was found to have occult blood in his stool on routine screening. Denies abdominal pain, constipation, diarrhea, change in stool caliber, melena, hematochezia, nausea, vomiting, dysphagia, reflux symptoms, chest pain.  Review of Systems: Pertinent positive and negative review of systems were noted in the above HPI section. All other review of systems were otherwise negative.  Current Medications, Allergies, Past Medical History, Past Surgical History, Family History and Social History were reviewed in [employer] record.  Physical Exam: General: Well developed, well nourished, elderly, thin, no acute distress Head: Normocephalic and atraumatic Eyes:  sclerae anicteric, EOMI Ears: Normal auditory acuity Mouth: No deformity or lesions Neck: Supple, no masses or thyromegaly Lungs: Clear throughout to auscultation Heart: Regular rate and rhythm; no murmurs, rubs or bruits Abdomen: Soft, non tender and non distended. No masses, hepatosplenomegaly or hernias noted. Normal Bowel sounds Rectal: deferred to colonoscopy Musculoskeletal: Symmetrical with no gross deformities  Skin: No lesions on visible extremities Pulses:  Normal pulses noted Extremities: No clubbing, cyanosis, edema or deformities noted Neurological: Alert oriented x 4, grossly nonfocal, uses a walker. Cervical Nodes:  No significant cervical adenopathy Inguinal Nodes: No significant inguinal adenopathy Psychological:  Alert and cooperative. Normal mood and  affect  Assessment and Recommendations:  1. Occult blood in stools. Personal history of an adenomatous colon polyp with high-grade dysplasia in 2007. Last colonoscopy in 10/2009 showed no polyps. Rule out recurrent colorectal neoplasms. He had numerous questions about findings of occult blood in stool, colonoscopy, polyps and sedation. I addressed all his questions to his satisfaction. The risks (including bleeding, perforation, infection, missed lesions, medication reactions and possible hospitalization or surgery if complications occur), benefits, and alternatives to colonoscopy with possible biopsy and possible polypectomy were discussed with the patient and they consent to proceed. Given his age and comorbidities I would recommend stopping routine annual screening testing for fecal occult blood.  2. Chronic antiplatelet therapy with clopidogrel. Hold clopidogrel for 5 days before procedure - will instruct when and how to resume after procedure. Low but real risk of cardiovascular event such as heart attack, stroke, embolism, thrombosis or ischemia/infarct of other organs off clopidogrel explained and need to seek urgent help if this occurs. The patient consents to proceed. Will communicate by phone or EMR with patient's prescribing provider to confirm that holding clopidogrel is reasonable in this case.    cc: Leanna Battles, MD [address] [place], [place] [address]

## 2015-02-18 NOTE — Telephone Encounter (Signed)
Faxed this form to Dr. Shon Baton office for response.

## 2015-02-20 DIAGNOSIS — H61001 Unspecified perichondritis of right external ear: Secondary | ICD-10-CM | POA: Diagnosis not present

## 2015-02-20 DIAGNOSIS — Z85820 Personal history of malignant melanoma of skin: Secondary | ICD-10-CM | POA: Diagnosis not present

## 2015-02-20 DIAGNOSIS — L821 Other seborrheic keratosis: Secondary | ICD-10-CM | POA: Diagnosis not present

## 2015-02-20 DIAGNOSIS — Z85828 Personal history of other malignant neoplasm of skin: Secondary | ICD-10-CM | POA: Diagnosis not present

## 2015-02-20 DIAGNOSIS — L565 Disseminated superficial actinic porokeratosis (DSAP): Secondary | ICD-10-CM | POA: Diagnosis not present

## 2015-02-20 DIAGNOSIS — L570 Actinic keratosis: Secondary | ICD-10-CM | POA: Diagnosis not present

## 2015-03-10 DIAGNOSIS — H25812 Combined forms of age-related cataract, left eye: Secondary | ICD-10-CM | POA: Diagnosis not present

## 2015-03-10 DIAGNOSIS — H35371 Puckering of macula, right eye: Secondary | ICD-10-CM | POA: Diagnosis not present

## 2015-03-19 NOTE — Telephone Encounter (Signed)
Faxed again to Dr. Shon Baton office for review.

## 2015-03-31 NOTE — Telephone Encounter (Signed)
Cody [name] called from Dr. Shon Baton office stating patient can come off Plavix 5 days prior to his procedure. Asked if Cody [name] could have Dr. Philip Aspen right that on the form and fax it back to [place]. She states she never revieved the form and asked if we could fax again. Faxed form again to [phone] per Cody [name].

## 2015-03-31 NOTE — Telephone Encounter (Signed)
Received fax back from Dr. Shon Baton office stating patient can come off Plavix 5 days prior to his procedure. Pt informed of Dr. Shon Baton recommendations and pt verbalized understanding.

## 2015-03-31 NOTE — Telephone Encounter (Signed)
Left a message for DJ at Dr. Shon Baton office regarding status of Plavix clearance prior to Colonoscopy scheduled for 04/16/15.

## 2015-04-14 ENCOUNTER — Telehealth: Payer: Self-pay | Admitting: Gastroenterology

## 2015-04-14 NOTE — Telephone Encounter (Signed)
Patient notified that if he needs to the Nitrostat certainly take it the day of the procedure, but he would need to notify the [facility] staff that he had taken it.  They will assess if the procedure is still safe to proceed with on that day.  He verbalized understanding

## 2015-04-15 ENCOUNTER — Telehealth: Payer: Self-pay | Admitting: Gastroenterology

## 2015-04-15 NOTE — Telephone Encounter (Signed)
Patient called this evening. He reports being told to drink 64 oz of water prior to drinking his Suprep, He reports he has been on clears today and after drinking the volume of water he "doesn't feel well" and does not think he can tolerate a bowel preparation tonight. In this light we will cancel his case for tomorrow (scheduled with Dr. Fuller Plan). Recommend he eat a meal and see if he feels better. Will pass this on to the nursing staff and have him rescheduled, will review bowel prep instruction and try to minimize the volume consumed for his next prep. He agreed. Rollene Fare can you please contact this patient and help with rescheduling. Thanks

## 2015-04-16 ENCOUNTER — Encounter: Payer: Medicare Other | Admitting: Gastroenterology

## 2015-04-16 NOTE — Telephone Encounter (Signed)
Please have previsit for him for repeat bowel prep and instructions and to reschedule colonoscopy.  OK to give Reglan 10 mg po before both doses of split prep.

## 2015-04-16 NOTE — Telephone Encounter (Signed)
Left message for patient to call back  

## 2015-04-16 NOTE — Telephone Encounter (Signed)
I called patient to see how he is doing and to Reschedule Colon but he did not answer. Left him a Voicemail to call back & Rescheduled Procedure w/a Previsit Appoitment. Barbie Haggis

## 2015-04-16 NOTE — Telephone Encounter (Deleted)
Please have previsit for him for repeat bowel prep and instructions and to reschedule colonoscopy.  OK to give Reglan 10 mg po before both doses of split prep.

## 2015-04-17 ENCOUNTER — Telehealth: Payer: Self-pay | Admitting: Gastroenterology

## 2015-04-17 NOTE — Telephone Encounter (Signed)
Left message for patient to call back  

## 2015-04-17 NOTE — Telephone Encounter (Signed)
See other phone note for additional details.  

## 2015-04-18 NOTE — Telephone Encounter (Signed)
Patient notified He would like to cancel for now.  He would like to think on it a while longer.  He will call back if he changes his mind and would like to reschedule.

## 2015-04-18 NOTE — Telephone Encounter (Signed)
Long discussion with the patient.  He is not sure if he wants to go through with the colonoscopy. He stated that if a mass was found in his colon he is not willing to seek treatment.  He asks if it is reasonable not to have the colonoscopy?

## 2015-04-18 NOTE — Telephone Encounter (Signed)
It is not unreasonable to cancel. It is his decision and we respect his choice.

## 2015-05-08 ENCOUNTER — Ambulatory Visit (INDEPENDENT_AMBULATORY_CARE_PROVIDER_SITE_OTHER): Payer: Medicare Other | Admitting: Podiatry

## 2015-05-08 ENCOUNTER — Encounter: Payer: Self-pay | Admitting: Podiatry

## 2015-05-08 DIAGNOSIS — B351 Tinea unguium: Secondary | ICD-10-CM

## 2015-05-08 DIAGNOSIS — M79676 Pain in unspecified toe(s): Secondary | ICD-10-CM

## 2015-05-08 DIAGNOSIS — L84 Corns and callosities: Secondary | ICD-10-CM

## 2015-05-08 NOTE — Progress Notes (Signed)
Subjective:     Patient ID: Cody [name], male   DOB: [birth year], 81 y.o.   MRN: [id number]  HPIThis patient presents to the office for preventive foot care services.  His nails have grown long and painful since his last visit.  He has painful callus under the ball of his right big toe  for which he wears a pad to help reduce the pain.     Review of Systems     Objective:   Physical Exam GENERAL APPEARANCE: Alert, conversant. Appropriately groomed. No acute distress.  VASCULAR: Pedal pulses are not palpable at  [facility] and PT bilateral.  Capillary refill time is  Diminished  to all digits,  Cold feet.  NEUROLOGIC: sensation is normal to 5.07 monofilament at 5/5 sites bilateral.  Light touch is intact bilateral, Muscle strength normal.  MUSCULOSKELETAL: acceptable muscle strength, tone and stability bilateral.  Intrinsic muscluature intact bilateral.  Rectus appearance of foot and digits noted bilateral.   DERMATOLOGIC: skin color, texture, and turgor are within normal limits.  No preulcerative lesions or ulcers  are seen, no interdigital maceration noted.  No open lesions present.  Digital nails are asymptomatic. No drainage noted.Porokeratosis sub 1st MPJ right foot.      Assessment:     Onychomycosis  Porokeratosis right foot     Plan:     Debridement of Nails  Debridement of porokeratosis   RTC 3 mos.     Gardiner Barefoot DPM

## 2015-07-16 ENCOUNTER — Emergency Department (HOSPITAL_COMMUNITY): Payer: Medicare Other

## 2015-07-16 ENCOUNTER — Encounter (HOSPITAL_COMMUNITY): Payer: Self-pay | Admitting: Emergency Medicine

## 2015-07-16 ENCOUNTER — Inpatient Hospital Stay (HOSPITAL_COMMUNITY)
Admission: EM | Admit: 2015-07-16 | Discharge: 2015-07-21 | DRG: 682 | Disposition: A | Discharge status: Skilled Nursing Facility | Payer: Medicare Other | Attending: Internal Medicine | Admitting: Internal Medicine

## 2015-07-16 DIAGNOSIS — K1379 Other lesions of oral mucosa: Secondary | ICD-10-CM

## 2015-07-16 DIAGNOSIS — E785 Hyperlipidemia, unspecified: Secondary | ICD-10-CM | POA: Diagnosis not present

## 2015-07-16 DIAGNOSIS — Z681 Body mass index (BMI) 19 or less, adult: Secondary | ICD-10-CM

## 2015-07-16 DIAGNOSIS — R059 Cough, unspecified: Secondary | ICD-10-CM

## 2015-07-16 DIAGNOSIS — R310 Gross hematuria: Secondary | ICD-10-CM | POA: Diagnosis not present

## 2015-07-16 DIAGNOSIS — K140 Glossitis: Secondary | ICD-10-CM | POA: Diagnosis not present

## 2015-07-16 DIAGNOSIS — H9190 Unspecified hearing loss, unspecified ear: Secondary | ICD-10-CM | POA: Diagnosis present

## 2015-07-16 DIAGNOSIS — R627 Adult failure to thrive: Secondary | ICD-10-CM | POA: Diagnosis not present

## 2015-07-16 DIAGNOSIS — W19XXXA Unspecified fall, initial encounter: Secondary | ICD-10-CM | POA: Diagnosis present

## 2015-07-16 DIAGNOSIS — Z961 Presence of intraocular lens: Secondary | ICD-10-CM | POA: Diagnosis present

## 2015-07-16 DIAGNOSIS — K219 Gastro-esophageal reflux disease without esophagitis: Secondary | ICD-10-CM | POA: Diagnosis not present

## 2015-07-16 DIAGNOSIS — S025XXA Fracture of tooth (traumatic), initial encounter for closed fracture: Secondary | ICD-10-CM

## 2015-07-16 DIAGNOSIS — K148 Other diseases of tongue: Secondary | ICD-10-CM

## 2015-07-16 DIAGNOSIS — Z79899 Other long term (current) drug therapy: Secondary | ICD-10-CM

## 2015-07-16 DIAGNOSIS — I10 Essential (primary) hypertension: Secondary | ICD-10-CM | POA: Diagnosis not present

## 2015-07-16 DIAGNOSIS — E43 Unspecified severe protein-calorie malnutrition: Secondary | ICD-10-CM | POA: Diagnosis not present

## 2015-07-16 DIAGNOSIS — J329 Chronic sinusitis, unspecified: Secondary | ICD-10-CM | POA: Diagnosis not present

## 2015-07-16 DIAGNOSIS — Z85820 Personal history of malignant melanoma of skin: Secondary | ICD-10-CM | POA: Diagnosis not present

## 2015-07-16 DIAGNOSIS — N390 Urinary tract infection, site not specified: Secondary | ICD-10-CM | POA: Diagnosis not present

## 2015-07-16 DIAGNOSIS — I480 Paroxysmal atrial fibrillation: Secondary | ICD-10-CM | POA: Diagnosis not present

## 2015-07-16 DIAGNOSIS — Z87891 Personal history of nicotine dependence: Secondary | ICD-10-CM

## 2015-07-16 DIAGNOSIS — R05 Cough: Secondary | ICD-10-CM | POA: Diagnosis not present

## 2015-07-16 DIAGNOSIS — K1370 Unspecified lesions of oral mucosa: Secondary | ICD-10-CM | POA: Diagnosis not present

## 2015-07-16 DIAGNOSIS — N400 Enlarged prostate without lower urinary tract symptoms: Secondary | ICD-10-CM | POA: Diagnosis not present

## 2015-07-16 DIAGNOSIS — R634 Abnormal weight loss: Secondary | ICD-10-CM | POA: Diagnosis not present

## 2015-07-16 DIAGNOSIS — Z8673 Personal history of transient ischemic attack (TIA), and cerebral infarction without residual deficits: Secondary | ICD-10-CM

## 2015-07-16 DIAGNOSIS — E860 Dehydration: Secondary | ICD-10-CM | POA: Diagnosis present

## 2015-07-16 DIAGNOSIS — IMO0001 Reserved for inherently not codable concepts without codable children: Secondary | ICD-10-CM

## 2015-07-16 DIAGNOSIS — R2689 Other abnormalities of gait and mobility: Secondary | ICD-10-CM | POA: Diagnosis present

## 2015-07-16 DIAGNOSIS — I69393 Ataxia following cerebral infarction: Secondary | ICD-10-CM

## 2015-07-16 DIAGNOSIS — K0530 Chronic periodontitis, unspecified: Secondary | ICD-10-CM | POA: Diagnosis not present

## 2015-07-16 DIAGNOSIS — S0990XA Unspecified injury of head, initial encounter: Secondary | ICD-10-CM | POA: Diagnosis not present

## 2015-07-16 DIAGNOSIS — K060 Gingival recession: Secondary | ICD-10-CM | POA: Diagnosis not present

## 2015-07-16 DIAGNOSIS — S199XXA Unspecified injury of neck, initial encounter: Secondary | ICD-10-CM | POA: Diagnosis not present

## 2015-07-16 DIAGNOSIS — Z7902 Long term (current) use of antithrombotics/antiplatelets: Secondary | ICD-10-CM

## 2015-07-16 DIAGNOSIS — N289 Disorder of kidney and ureter, unspecified: Secondary | ICD-10-CM | POA: Diagnosis not present

## 2015-07-16 DIAGNOSIS — S098XXA Other specified injuries of head, initial encounter: Secondary | ICD-10-CM | POA: Diagnosis not present

## 2015-07-16 DIAGNOSIS — R531 Weakness: Secondary | ICD-10-CM | POA: Diagnosis not present

## 2015-07-16 DIAGNOSIS — R29818 Other symptoms and signs involving the nervous system: Secondary | ICD-10-CM | POA: Diagnosis not present

## 2015-07-16 DIAGNOSIS — N179 Acute kidney failure, unspecified: Secondary | ICD-10-CM | POA: Diagnosis not present

## 2015-07-16 DIAGNOSIS — W1830XA Fall on same level, unspecified, initial encounter: Secondary | ICD-10-CM | POA: Diagnosis present

## 2015-07-16 DIAGNOSIS — S0101XA Laceration without foreign body of scalp, initial encounter: Secondary | ICD-10-CM | POA: Diagnosis not present

## 2015-07-16 DIAGNOSIS — K146 Glossodynia: Secondary | ICD-10-CM | POA: Diagnosis not present

## 2015-07-16 DIAGNOSIS — Z9841 Cataract extraction status, right eye: Secondary | ICD-10-CM | POA: Diagnosis not present

## 2015-07-16 DIAGNOSIS — R52 Pain, unspecified: Secondary | ICD-10-CM

## 2015-07-16 LAB — URINALYSIS, ROUTINE W REFLEX MICROSCOPIC
Bilirubin Urine: NEGATIVE
GLUCOSE, UA: NEGATIVE mg/dL
Ketones, ur: 15 mg/dL — AB
Nitrite: POSITIVE — AB
Protein, ur: NEGATIVE mg/dL
SPECIFIC GRAVITY, URINE: 1.017 (ref 1.005–1.030)
pH: 6 (ref 5.0–8.0)

## 2015-07-16 LAB — URINE MICROSCOPIC-ADD ON: RBC / HPF: NONE SEEN RBC/hpf (ref 0–5)

## 2015-07-16 LAB — CBC WITH DIFFERENTIAL/PLATELET
BASOS ABS: 0.1 10*3/uL (ref 0.0–0.1)
Basophils Relative: 1 %
EOS PCT: 0 %
Eosinophils Absolute: 0 10*3/uL (ref 0.0–0.7)
HEMATOCRIT: 39 % (ref 39.0–52.0)
Hemoglobin: 13.6 g/dL (ref 13.0–17.0)
LYMPHS ABS: 0.8 10*3/uL (ref 0.7–4.0)
LYMPHS PCT: 9 %
MCH: 31.6 pg (ref 26.0–34.0)
MCHC: 34.9 g/dL (ref 30.0–36.0)
MCV: 90.5 fL (ref 78.0–100.0)
Monocytes Absolute: 0.6 10*3/uL (ref 0.1–1.0)
Monocytes Relative: 6 %
NEUTROS ABS: 8 10*3/uL — AB (ref 1.7–7.7)
Neutrophils Relative %: 84 %
PLATELETS: 205 10*3/uL (ref 150–400)
RBC: 4.31 MIL/uL (ref 4.22–5.81)
RDW: 13 % (ref 11.5–15.5)
WBC: 9.5 10*3/uL (ref 4.0–10.5)

## 2015-07-16 LAB — BASIC METABOLIC PANEL
ANION GAP: 14 (ref 5–15)
BUN: 38 mg/dL — AB (ref 6–20)
CHLORIDE: 99 mmol/L — AB (ref 101–111)
CO2: 24 mmol/L (ref 22–32)
Calcium: 9.8 mg/dL (ref 8.9–10.3)
Creatinine, Ser: 1.47 mg/dL — ABNORMAL HIGH (ref 0.61–1.24)
GFR calc Af Amer: 47 mL/min — ABNORMAL LOW (ref >60)
GFR, EST NON AFRICAN AMERICAN: 41 mL/min — AB (ref >60)
GLUCOSE: 123 mg/dL — AB (ref 65–99)
POTASSIUM: 3.8 mmol/L (ref 3.5–5.1)
Sodium: 137 mmol/L (ref 135–145)

## 2015-07-16 MED ORDER — TETANUS-DIPHTH-ACELL PERTUSSIS 5-2.5-18.5 LF-MCG/0.5 IM SUSP
0.5000 mL | Freq: Once | INTRAMUSCULAR | Status: AC
  Administered 2015-07-16: 0.5 mL via INTRAMUSCULAR
  Filled 2015-07-16: qty 0.5

## 2015-07-16 NOTE — ED Provider Notes (Signed)
CSN: [id number]     Arrival date & time 07/16/15  1951 History   First MD Initiated Contact with Patient 07/16/15 1956     Chief Complaint  Patient presents with  . Fall  . Head Laceration     (Consider location/radiation/quality/duration/timing/severity/associated sxs/prior Treatment) The history is provided by the patient.  82 year-old male lost his balance and out of a car and fell striking his occiput on the ground. He severed laceration but denies loss of consciousness. He denies other injury. There has been no nausea or vomiting. He is taking clopidogrel, but no other anticoagulants or antiplatelet agents. Last tetanus immunization was in childhood.  Past Medical History  Diagnosis Date  . BPH (benign prostatic hyperplasia)   . Hypertension   . Diverticulosis   . Melanoma ([place])     left forearm  . Left carotid bruit   . Osteopenia   . Hyperlipidemia   . Basal cell carcinoma     left forearm  . Inguinal hernia   . Stroke ([place]) 3/07    affected balance  . GERD (gastroesophageal reflux disease)     occurs rarely  . Anginal pain ([place])     nuclear stress 03/12/13 EPIC  . Tubular adenoma of colon 2007  . Diverticulosis   . Paroxysmal atrial fibrillation [facility])    Past Surgical History  Procedure Laterality Date  . Colonoscopy  2008  . Left forearm melanoma    . Prostate biopsy    . Eye surgery Right     cataract extraction with IOL  . Inguinal hernia repair Right 03/15/2013    Procedure: HERNIA REPAIR INGUINAL ADULT;  Surgeon: Odis Hollingshead, MD;  Location: [facility];  Service: General;  Laterality: Right;  . Insertion of mesh Right 03/15/2013    Procedure: INSERTION OF MESH;  Surgeon: Odis Hollingshead, MD;  Location: [facility];  Service: General;  Laterality: Right;   Family History  Problem Relation Age of Onset  . Cancer Mother     lung and breast  . Heart disease Father     MI  . Cancer Sister     pancreatic   Social History  Substance Use Topics  . Smoking  status: Former Research scientist (life sciences)  . Smokeless tobacco: Never Used  . Alcohol Use: Yes     Comment:  3 ounces wine nightly    Review of Systems  All other systems reviewed and are negative.     Allergies  Aspirin and Eggs or egg-derived products  Home Medications   Prior to Admission medications   Medication Sig Start Date End Date Taking? Authorizing Provider  Cholecalciferol (VITAMIN D) 400 UNITS capsule Take 400 Units by mouth 2 (two) times daily.    Historical Provider, MD  clopidogrel (PLAVIX) 75 MG tablet  05/14/14   Historical Provider, MD  digoxin (LANOXIN) 0.25 MG tablet Take 0.25 mg by mouth every morning.     Historical Provider, MD  dutasteride (AVODART) 0.5 MG capsule Take 0.5 mg by mouth every other day.     Historical Provider, MD  hydrochlorothiazide (HYDRODIURIL) 25 MG tablet Take 12.5 mg by mouth every evening.     Historical Provider, MD  Na Sulfate-K Sulfate-Mg Sulf SOLN Take 1 kit by mouth once. 02/18/15   Ladene Artist, MD  NITROSTAT 0.4 MG SL tablet Place 0.4 mg under the tongue every 5 (five) minutes as needed for chest pain.  01/05/13   Historical Provider, MD  polyethylene glycol (MIRALAX / GLYCOLAX) packet  Take 17 g by mouth every other day.    Historical Provider, MD  pravastatin (PRAVACHOL) 20 MG tablet Take 20 mg by mouth every evening.  12/15/12   Historical Provider, MD  triamterene-hydrochlorothiazide (MAXZIDE-25) 37.5-25 MG per tablet  07/01/14   Historical Provider, MD   BP 131/78 mmHg  Pulse 76  Resp 15  SpO2 97% Physical Exam  Nursing note and vitals reviewed.  82 year old male, resting comfortably and in no acute distress. Vital signs are normal. Oxygen saturation is 97%, which is normal. Head is normocephalic. PERRLA, EOMI. Oropharynx is clear. Scalp laceration is present on the occiput. Neck is nontender without adenopathy or JVD. Back is nontender and there is no CVA tenderness. Lungs are clear without rales, wheezes, or rhonchi. Chest is  nontender. Heart has regular rate and rhythm without murmur. Abdomen is soft, flat, nontender without masses or hepatosplenomegaly and peristalsis is normoactive. Extremities have no cyanosis or edema, full range of motion is present. Skin is warm and dry without rash. Neurologic: Mental status is normal, cranial nerves are intact, there are no motor or sensory deficits.  ED Course  Procedures (including critical care time) LACERATION REPAIR Performed by: MOQHU,[email or web address] Authorized by: TKPTW,SFKCL Consent: Verbal consent obtained. Risks and benefits: risks, benefits and alternatives were discussed Consent given by: patient Patient identity confirmed: provided demographic data Prepped and Draped in normal sterile fashion Wound explored  Laceration Location: occipital scalp  Laceration Length: 4.0 cm, stellate  No Foreign Bodies seen or palpated  Anesthesia: local infiltration  Local anesthetic: None  Amount of cleaning: standard  Skin closure: close  Number of staples: 6  Technique: surgical stapling  Patient tolerance: Patient tolerated the procedure well with no immediate complications.   Imaging Review Ct Head Wo Contrast  07/16/2015  CLINICAL DATA:  82 year old male with fall EXAM: CT HEAD WITHOUT CONTRAST CT CERVICAL SPINE WITHOUT CONTRAST TECHNIQUE: Multidetector CT imaging of the head and cervical spine was performed following the standard protocol without intravenous contrast. Multiplanar CT image reconstructions of the cervical spine were also generated. COMPARISON:  CT dated 09/20/2005 and MRI dated 10/22/2005 FINDINGS: CT HEAD FINDINGS There is slight prominence of the ventricles and sulci compatible with age-related volume loss. Mild periventricular and deep white matter hypodensities represent chronic microvascular ischemic changes. Focal right posterior parietal parenchymal hypodensity compatible with an old infarct. There is no intracranial hemorrhage. No mass  effect or midline shift identified. Mild mucoperiosteal thickening of paranasal sinuses. No air-fluid level. The mastoid air cells are well aerated. There is posterior scalp laceration and hematoma. The calvarium is intact. CT CERVICAL SPINE FINDINGS There is no acute fracture or subluxation of the cervical spine.There is osteopenia with multilevel degenerative changes most prominent at C5-C6, and C6-C7 where there is endplate irregularity and disc space narrowing.The odontoid and spinous processes are intact.There is normal anatomic alignment of the C1-C2 lateral masses. The visualized soft tissues appear unremarkable. IMPRESSION: No acute intracranial hemorrhage. Age-related atrophy and chronic microvascular ischemic changes. No acute/traumatic cervical spine pathology. Electronically Signed   By: Anner Crete M.D.   On: 07/16/2015 21:16   Ct Cervical Spine Wo Contrast  07/16/2015  CLINICAL DATA:  82 year old male with fall EXAM: CT HEAD WITHOUT CONTRAST CT CERVICAL SPINE WITHOUT CONTRAST TECHNIQUE: Multidetector CT imaging of the head and cervical spine was performed following the standard protocol without intravenous contrast. Multiplanar CT image reconstructions of the cervical spine were also generated. COMPARISON:  CT dated 09/20/2005 and MRI dated 10/22/2005 FINDINGS:  CT HEAD FINDINGS There is slight prominence of the ventricles and sulci compatible with age-related volume loss. Mild periventricular and deep white matter hypodensities represent chronic microvascular ischemic changes. Focal right posterior parietal parenchymal hypodensity compatible with an old infarct. There is no intracranial hemorrhage. No mass effect or midline shift identified. Mild mucoperiosteal thickening of paranasal sinuses. No air-fluid level. The mastoid air cells are well aerated. There is posterior scalp laceration and hematoma. The calvarium is intact. CT CERVICAL SPINE FINDINGS There is no acute fracture or subluxation  of the cervical spine.There is osteopenia with multilevel degenerative changes most prominent at C5-C6, and C6-C7 where there is endplate irregularity and disc space narrowing.The odontoid and spinous processes are intact.There is normal anatomic alignment of the C1-C2 lateral masses. The visualized soft tissues appear unremarkable. IMPRESSION: No acute intracranial hemorrhage. Age-related atrophy and chronic microvascular ischemic changes. No acute/traumatic cervical spine pathology. Electronically Signed   By: Anner Crete M.D.   On: 07/16/2015 21:16   I have personally reviewed and evaluated these images and lab results as part of my medical decision-making.   EKG Interpretation   Date/Time:  Wednesday July 16 2015 20:11:16 EST Ventricular Rate:  73 PR Interval:  258 QRS Duration: 115 QT Interval:  377 QTC Calculation: 415 R Axis:   -57 Text Interpretation:  Sinus rhythm with frequent Premature ventricular  complexes Prolonged PR interval Probable left atrial enlargement  Nonspecific IVCD with LAD LVH with secondary repolarization abnormality  Anterior Q waves, possibly due to LVH Baseline wander in lead(s) V2 When  compared with ECG of 03/12/2013, Left axis deviation is now Present  Premature ventricular complexes are now Present Confirmed by [facility]  MD,  Kinsey Karch ([id number]) on 07/16/2015 8:17:12 PM      MDM   Final diagnoses:  Fall in elderly patient, initial encounter  Weakness  Scalp laceration, initial encounter  Renal insufficiency  Tongue lesion    Fall with scalp laceration. He will be sent for CT scan of head and cervical spine prior to repair of laceration. Old records are reviewed and he has no prior ED visits for falls.  CT is unremarkable. Laceration is closed with surgical stapling. At this point, patient and caregiver state that he is too weak to go home. Also, he is supposed to be referred to ENT because of a painful lesion on his tongue. Will get screening labs  to see if he has any condition which would warrant hospitalization.  Workup the show renal insufficiency which is new. Case is discussed with Dr. Tamala Julian of [facility] agrees to admit the patient under observation status.  Delora Fuel, MD [id number]

## 2015-07-16 NOTE — ED Notes (Addendum)
Pt. lost his balance and fell backwards at a parking lot this evening , denies LOC , alert and oriented , presents with occipital scalp laceration approx. 2" with moderate bleeding . Pt. Stated he his taking anticoagulant medication .

## 2015-07-16 NOTE — H&P (Signed)
[facility] History and Physical  Cody [name] QMG:[id number] DOB: January 14, 1925 DOA: 07/16/2015  Referring physician: Dr. Roxanne Mins PCP: Donnajean Lopes, MD   Chief Complaint: Fall  HPI:  Cody [name] is a 82 year old male with a past medical history of HTN, BPH, left carotid bruit, previous CVA with residual balance issues; who presents after having a fall this evening. Patient notes while getting out of the car this evening he fell backwards hitting his head on the ground. He suffered a laceration, but denies any loss of consciousness. He does complain of some lower back pain. Further investigation patient notes that he's been dealing with sinus congestion and posterior drainage since Christmas time. The posterior drainage is causing him to be nauseous stomach. Also describing significant tongue pain which has caused him not to eat as well as he usually does over the last few weeks. He saw his primary care regarding this issue and was given lidocaine to swish and some other medicine with some mild relief. Normally he was round 145 pounds but since he's having the tongue pain and discomfort he's dropped down to 123 pounds. He states he is just too weak to take care of himself right now, as he normally  lives alone and walks with the assistance of a cane.    Review of Systems  Constitutional: Negative for chills and weight loss.  HENT: Positive for hearing loss.   Eyes: Positive for photophobia.  Cardiovascular: Negative for chest pain and palpitations.  Gastrointestinal: Negative for nausea, vomiting, abdominal pain, diarrhea and constipation.  Genitourinary: Positive for frequency. Negative for dysuria and hematuria.  Musculoskeletal: Positive for back pain and falls.  Skin: Negative for itching and rash.  Neurological: Positive for headaches. Negative for tremors, sensory change, seizures and loss of consciousness.       Balance issues  Endo/Heme/Allergies: Negative for environmental  allergies and polydipsia.  Psychiatric/Behavioral: Negative for hallucinations and substance abuse.     Past Medical History  Diagnosis Date  . BPH (benign prostatic hyperplasia)   . Hypertension   . Diverticulosis   . Melanoma ([place])     left forearm  . Left carotid bruit   . Osteopenia   . Hyperlipidemia   . Basal cell carcinoma     left forearm  . Inguinal hernia   . Stroke ([place]) 3/07    affected balance  . GERD (gastroesophageal reflux disease)     occurs rarely  . Anginal pain ([place])     nuclear stress 03/12/13 EPIC  . Tubular adenoma of colon 2007  . Diverticulosis   . Paroxysmal atrial fibrillation [facility])      Past Surgical History  Procedure Laterality Date  . Colonoscopy  2008  . Left forearm melanoma    . Prostate biopsy    . Eye surgery Right     cataract extraction with IOL  . Inguinal hernia repair Right 03/15/2013    Procedure: HERNIA REPAIR INGUINAL ADULT;  Surgeon: Odis Hollingshead, MD;  Location: [facility];  Service: General;  Laterality: Right;  . Insertion of mesh Right 03/15/2013    Procedure: INSERTION OF MESH;  Surgeon: Odis Hollingshead, MD;  Location: [facility];  Service: General;  Laterality: Right;      Social History:  reports that he has quit smoking. He has never used smokeless tobacco. He reports that he drinks alcohol. He reports that he does not use illicit drugs. Where does patient live--home  and with whom if at home? alone Can patient  participate in ADLs? YES  Allergies  Allergen Reactions  . Aspirin   . Eggs Or Egg-Derived Products Swelling    throat    Family History  Problem Relation Age of Onset  . Cancer Mother     lung and breast  . Heart disease Father     MI  . Cancer Sister     pancreatic        Prior to Admission medications   Medication Sig Start Date End Date Taking? Authorizing Provider  clopidogrel (PLAVIX) 75 MG tablet Take 75 mg by mouth daily.  05/14/14  Yes Historical Provider, MD  digoxin (LANOXIN)  0.25 MG tablet Take 0.25 mg by mouth every morning.    Yes Historical Provider, MD  dutasteride (AVODART) 0.5 MG capsule Take 0.5 mg by mouth every other day.    Yes Historical Provider, MD  hydrochlorothiazide (HYDRODIURIL) 25 MG tablet Take 12.5 mg by mouth every evening.    Yes Historical Provider, MD  polyethylene glycol (MIRALAX / GLYCOLAX) packet Take 17 g by mouth every other day.   Yes Historical Provider, MD  pravastatin (PRAVACHOL) 20 MG tablet Take 20 mg by mouth every evening.  12/15/12  Yes Historical Provider, MD  triamterene-hydrochlorothiazide (MAXZIDE-25) 37.5-25 MG per tablet Take 1 tablet by mouth daily.  07/01/14  Yes Historical Provider, MD  Na Sulfate-K Sulfate-Mg Sulf SOLN Take 1 kit by mouth once. Patient not taking: Reported on 07/16/2015 02/18/15   Ladene Artist, MD  NITROSTAT 0.4 MG SL tablet Place 0.4 mg under the tongue every 5 (five) minutes as needed for chest pain.  01/05/13   Historical Provider, MD     Physical Exam: Filed Vitals:   07/16/15 2200 07/16/15 2215 07/16/15 2230 07/16/15 2241  BP: 138/78 138/75 115/73 130/71  Pulse:    84  Resp:    12  SpO2:    99%     Constitutional: Vital signs reviewed. Patient is Frail elderly cachectic male who is otherwise in no acute distress at this time alert and oriented 3.  Head: Normocephalic and laceration to the posterior occipital with staples present 4 cm in length with 6 staples  Ear: TM normal bilaterally  Mouth: no erythema or exudates, dry mucous membranes with a blackened tongue question of possible thrush Eyes: PERRL, EOMI, conjunctivae normal, No scleral icterus.  Neck: Supple, Trachea midline normal ROM, No JVD, mass, thyromegaly, or carotid bruit present.  Cardiovascular: RRR, S1 normal, S2 normal, no MRG, pulses symmetric and intact bilaterally  Pulmonary/Chest: CTAB, no wheezes, rales, or rhonchi.  ribs showing Abdominal: Soft. Non-tender, non-distended, bowel sounds are normal, no masses,  organomegaly, or guarding present.  GU: no CVA tenderness Musculoskeletal: No joint deformities, erythema, or stiffness, ROM full and no nontender Ext: no edema and no cyanosis, pulses palpable bilaterally (DP and PT)  Hematology: no cervical, inginal, or axillary adenopathy.  Neurological: A&O x3, Strenght is approximately 4 out of 5 symmetric bilaterally, cranial nerve II-XII are grossly intact, no focal motor deficit, sensory intact to light touch bilaterally.  Skin: Laceration to scalp as noted above with abrasions to the right elbow and bruises multiple different places. Psychiatric: Normal mood and affect. speech and behavior is normal. Judgment and thought content normal. Cognition and memory are normal.      Data Review   Micro Results No results found for this or any previous visit (from the past 240 hour(s)).  Radiology Reports Ct Head Wo Contrast  07/16/2015  CLINICAL DATA:  82 year old  male with fall EXAM: CT HEAD WITHOUT CONTRAST CT CERVICAL SPINE WITHOUT CONTRAST TECHNIQUE: Multidetector CT imaging of the head and cervical spine was performed following the standard protocol without intravenous contrast. Multiplanar CT image reconstructions of the cervical spine were also generated. COMPARISON:  CT dated 09/20/2005 and MRI dated 10/22/2005 FINDINGS: CT HEAD FINDINGS There is slight prominence of the ventricles and sulci compatible with age-related volume loss. Mild periventricular and deep white matter hypodensities represent chronic microvascular ischemic changes. Focal right posterior parietal parenchymal hypodensity compatible with an old infarct. There is no intracranial hemorrhage. No mass effect or midline shift identified. Mild mucoperiosteal thickening of paranasal sinuses. No air-fluid level. The mastoid air cells are well aerated. There is posterior scalp laceration and hematoma. The calvarium is intact. CT CERVICAL SPINE FINDINGS There is no acute fracture or subluxation of  the cervical spine.There is osteopenia with multilevel degenerative changes most prominent at C5-C6, and C6-C7 where there is endplate irregularity and disc space narrowing.The odontoid and spinous processes are intact.There is normal anatomic alignment of the C1-C2 lateral masses. The visualized soft tissues appear unremarkable. IMPRESSION: No acute intracranial hemorrhage. Age-related atrophy and chronic microvascular ischemic changes. No acute/traumatic cervical spine pathology. Electronically Signed   By: Anner Crete M.D.   On: 07/16/2015 21:16   Ct Cervical Spine Wo Contrast  07/16/2015  CLINICAL DATA:  82 year old male with fall EXAM: CT HEAD WITHOUT CONTRAST CT CERVICAL SPINE WITHOUT CONTRAST TECHNIQUE: Multidetector CT imaging of the head and cervical spine was performed following the standard protocol without intravenous contrast. Multiplanar CT image reconstructions of the cervical spine were also generated. COMPARISON:  CT dated 09/20/2005 and MRI dated 10/22/2005 FINDINGS: CT HEAD FINDINGS There is slight prominence of the ventricles and sulci compatible with age-related volume loss. Mild periventricular and deep white matter hypodensities represent chronic microvascular ischemic changes. Focal right posterior parietal parenchymal hypodensity compatible with an old infarct. There is no intracranial hemorrhage. No mass effect or midline shift identified. Mild mucoperiosteal thickening of paranasal sinuses. No air-fluid level. The mastoid air cells are well aerated. There is posterior scalp laceration and hematoma. The calvarium is intact. CT CERVICAL SPINE FINDINGS There is no acute fracture or subluxation of the cervical spine.There is osteopenia with multilevel degenerative changes most prominent at C5-C6, and C6-C7 where there is endplate irregularity and disc space narrowing.The odontoid and spinous processes are intact.There is normal anatomic alignment of the C1-C2 lateral masses. The  visualized soft tissues appear unremarkable. IMPRESSION: No acute intracranial hemorrhage. Age-related atrophy and chronic microvascular ischemic changes. No acute/traumatic cervical spine pathology. Electronically Signed   By: Anner Crete M.D.   On: 07/16/2015 21:16     CBC  Recent Labs Lab 07/16/15 2239  WBC 9.5  HGB 13.6  HCT 39.0  PLT 205  MCV 90.5  MCH 31.6  MCHC 34.9  RDW 13.0  LYMPHSABS 0.8  MONOABS 0.6  EOSABS 0.0  BASOSABS 0.1    Chemistries   Recent Labs Lab 07/16/15 2239  NA 137  K 3.8  CL 99*  CO2 24  GLUCOSE 123*  BUN 38*  CREATININE 1.47*  CALCIUM 9.8   ------------------------------------------------------------------------------------------------------------------ CrCl cannot be calculated (Unknown ideal weight.). ------------------------------------------------------------------------------------------------------------------ No results for input(s): HGBA1C in the last 72 hours. ------------------------------------------------------------------------------------------------------------------ No results for input(s): CHOL, HDL, LDLCALC, TRIG, CHOLHDL, LDLDIRECT in the last 72 hours. ------------------------------------------------------------------------------------------------------------------ No results for input(s): TSH, T4TOTAL, T3FREE, THYROIDAB in the last 72 hours.  Invalid input(s): FREET3 ------------------------------------------------------------------------------------------------------------------ No results for input(s): VITAMINB12, FOLATE,  FERRITIN, TIBC, IRON, RETICCTPCT in the last 72 hours.  Coagulation profile No results for input(s): INR, PROTIME in the last 168 hours.  No results for input(s): DDIMER in the last 72 hours.  Cardiac Enzymes No results for input(s): CKMB, TROPONINI, MYOGLOBIN in the last 168 hours.  Invalid input(s):  CK ------------------------------------------------------------------------------------------------------------------ Invalid input(s): POCBNP   CBG: No results for input(s): GLUCAP in the last 168 hours.     EKG: Independently reviewed. Sinus rhythm with PVCs and left axis deviation appears to be new   Assessment/Plan Principal Problem:   Fall with posterior scalp laceration: Acute. Patient states while getting out of the car he lost his balance and fell backwards hitting his head against the pavement. Patient evaluated in ED with CT scan showed no acute intracranial bleed. Patient received staples to the posterior scalp, and was given to Tdap in ED - Admit to MedSurg bed - Neuro checks will likely need repeat CT scan if cognition declines - Hydrocodone prn pain - Set bed alarm patient only to get up with assistance  Failure to thrive in adult with complaints of weight loss and generalized weakness: Patient reports a weight loss of approximately 20 pounds in the last month. Patient equates symptoms to tongue pain. Patient reports being unable to care for himself at this point in time - RPR, ESR, CRP, HIV - Physical therapy to eval and treat - Nutrition consult - Pre-albumin in a.m. - Social work consult  AKI (acute kidney injury) [facility]): Baseline creatinine was previously 0.95 in 02/2013, but on admission patient's creatinine is acutely elevated at 1.47 with a BUN of 38. Suspect this is prerenal as patient was seen have a decreased chloride of 99 as well and states he's had decreased by mouth intake. - IV fluids normal saline bolus 500 mL then 75 mL per hour - Strict ins and outs - Recheck BMP - Hold nephrotoxic agents  Dehydration: As seen by the elevated BUN to creatinine ratio.  Tongue pain: Oral inspection shows a blackened tongue no acute pathology question of possibility of thrush initially on physical exam. Patient has poor dentition question possibility of underlying  abscess. - May warrant a maxillofacial CT with contrast when creatinine improved  - Magic mouthwash  Sinusitis: Subacute to chronic. Patient notes post nasal drainage significantly making him nauseous. - Mucinex - Abx as seen below  Possible UTI - Urine culture ordered - Given Rocephin for symptoms of a UTI possibly and as well as sinusitis complaints  BPH - Continue Avodart    Hypertension: Blood pressure stable - Held Maxzide and HCTZ secondary to acute kidney injury  History of CVA (cerebrovascular accident)with residual Balance problem - PT - Continue Plavix  HLD - pravastatin   Code Status:   full Family Communication: bedside Disposition Plan: admit   Total time spent 55 minutes.Greater than 50% of this time was spent in counseling, explanation of diagnosis, planning of further management, and coordination of care  [facility] Pager [phone]  If 7PM-7AM, please contact night-coverage [email or web address] Password [email or web address] 07/16/2015, 11:50 PM

## 2015-07-16 NOTE — ED Notes (Signed)
Patient transported to CT scan . 

## 2015-07-17 ENCOUNTER — Inpatient Hospital Stay (HOSPITAL_COMMUNITY): Payer: Medicare Other

## 2015-07-17 ENCOUNTER — Ambulatory Visit: Payer: Medicare Other | Admitting: Podiatry

## 2015-07-17 DIAGNOSIS — M6281 Muscle weakness (generalized): Secondary | ICD-10-CM | POA: Diagnosis not present

## 2015-07-17 DIAGNOSIS — Z87891 Personal history of nicotine dependence: Secondary | ICD-10-CM | POA: Diagnosis not present

## 2015-07-17 DIAGNOSIS — E785 Hyperlipidemia, unspecified: Secondary | ICD-10-CM

## 2015-07-17 DIAGNOSIS — I480 Paroxysmal atrial fibrillation: Secondary | ICD-10-CM | POA: Diagnosis present

## 2015-07-17 DIAGNOSIS — Z8673 Personal history of transient ischemic attack (TIA), and cerebral infarction without residual deficits: Secondary | ICD-10-CM

## 2015-07-17 DIAGNOSIS — W19XXXA Unspecified fall, initial encounter: Secondary | ICD-10-CM | POA: Diagnosis not present

## 2015-07-17 DIAGNOSIS — N390 Urinary tract infection, site not specified: Secondary | ICD-10-CM | POA: Diagnosis present

## 2015-07-17 DIAGNOSIS — Z9181 History of falling: Secondary | ICD-10-CM | POA: Diagnosis not present

## 2015-07-17 DIAGNOSIS — Z961 Presence of intraocular lens: Secondary | ICD-10-CM | POA: Diagnosis present

## 2015-07-17 DIAGNOSIS — E43 Unspecified severe protein-calorie malnutrition: Secondary | ICD-10-CM

## 2015-07-17 DIAGNOSIS — R531 Weakness: Secondary | ICD-10-CM

## 2015-07-17 DIAGNOSIS — K146 Glossodynia: Secondary | ICD-10-CM | POA: Diagnosis present

## 2015-07-17 DIAGNOSIS — J0100 Acute maxillary sinusitis, unspecified: Secondary | ICD-10-CM

## 2015-07-17 DIAGNOSIS — R2681 Unsteadiness on feet: Secondary | ICD-10-CM | POA: Diagnosis not present

## 2015-07-17 DIAGNOSIS — I10 Essential (primary) hypertension: Secondary | ICD-10-CM | POA: Diagnosis present

## 2015-07-17 DIAGNOSIS — W1830XA Fall on same level, unspecified, initial encounter: Secondary | ICD-10-CM | POA: Diagnosis present

## 2015-07-17 DIAGNOSIS — R310 Gross hematuria: Secondary | ICD-10-CM | POA: Diagnosis present

## 2015-07-17 DIAGNOSIS — Z9841 Cataract extraction status, right eye: Secondary | ICD-10-CM | POA: Diagnosis not present

## 2015-07-17 DIAGNOSIS — N400 Enlarged prostate without lower urinary tract symptoms: Secondary | ICD-10-CM | POA: Diagnosis present

## 2015-07-17 DIAGNOSIS — R627 Adult failure to thrive: Secondary | ICD-10-CM | POA: Diagnosis present

## 2015-07-17 DIAGNOSIS — K140 Glossitis: Secondary | ICD-10-CM | POA: Diagnosis present

## 2015-07-17 DIAGNOSIS — H9190 Unspecified hearing loss, unspecified ear: Secondary | ICD-10-CM | POA: Diagnosis present

## 2015-07-17 DIAGNOSIS — R05 Cough: Secondary | ICD-10-CM | POA: Diagnosis not present

## 2015-07-17 DIAGNOSIS — Z681 Body mass index (BMI) 19 or less, adult: Secondary | ICD-10-CM | POA: Diagnosis not present

## 2015-07-17 DIAGNOSIS — K0530 Chronic periodontitis, unspecified: Secondary | ICD-10-CM | POA: Diagnosis present

## 2015-07-17 DIAGNOSIS — S0101XA Laceration without foreign body of scalp, initial encounter: Secondary | ICD-10-CM | POA: Diagnosis present

## 2015-07-17 DIAGNOSIS — E860 Dehydration: Secondary | ICD-10-CM | POA: Diagnosis present

## 2015-07-17 DIAGNOSIS — M47892 Other spondylosis, cervical region: Secondary | ICD-10-CM | POA: Diagnosis not present

## 2015-07-17 DIAGNOSIS — E46 Unspecified protein-calorie malnutrition: Secondary | ICD-10-CM | POA: Diagnosis not present

## 2015-07-17 DIAGNOSIS — R6884 Jaw pain: Secondary | ICD-10-CM | POA: Diagnosis not present

## 2015-07-17 DIAGNOSIS — K1379 Other lesions of oral mucosa: Secondary | ICD-10-CM | POA: Diagnosis not present

## 2015-07-17 DIAGNOSIS — R2689 Other abnormalities of gait and mobility: Secondary | ICD-10-CM | POA: Diagnosis present

## 2015-07-17 DIAGNOSIS — N179 Acute kidney failure, unspecified: Secondary | ICD-10-CM | POA: Diagnosis not present

## 2015-07-17 DIAGNOSIS — R29818 Other symptoms and signs involving the nervous system: Secondary | ICD-10-CM | POA: Diagnosis not present

## 2015-07-17 DIAGNOSIS — I69393 Ataxia following cerebral infarction: Secondary | ICD-10-CM | POA: Diagnosis not present

## 2015-07-17 DIAGNOSIS — Z79899 Other long term (current) drug therapy: Secondary | ICD-10-CM | POA: Diagnosis not present

## 2015-07-17 DIAGNOSIS — R1311 Dysphagia, oral phase: Secondary | ICD-10-CM | POA: Diagnosis not present

## 2015-07-17 DIAGNOSIS — Z85820 Personal history of malignant melanoma of skin: Secondary | ICD-10-CM | POA: Diagnosis not present

## 2015-07-17 DIAGNOSIS — J329 Chronic sinusitis, unspecified: Secondary | ICD-10-CM | POA: Diagnosis present

## 2015-07-17 DIAGNOSIS — R51 Headache: Secondary | ICD-10-CM | POA: Diagnosis not present

## 2015-07-17 DIAGNOSIS — Z7902 Long term (current) use of antithrombotics/antiplatelets: Secondary | ICD-10-CM | POA: Diagnosis not present

## 2015-07-17 DIAGNOSIS — K060 Gingival recession: Secondary | ICD-10-CM | POA: Diagnosis present

## 2015-07-17 DIAGNOSIS — R278 Other lack of coordination: Secondary | ICD-10-CM | POA: Diagnosis not present

## 2015-07-17 DIAGNOSIS — K219 Gastro-esophageal reflux disease without esophagitis: Secondary | ICD-10-CM | POA: Diagnosis present

## 2015-07-17 HISTORY — DX: Personal history of transient ischemic attack (TIA), and cerebral infarction without residual deficits: Z86.73

## 2015-07-17 HISTORY — DX: Hyperlipidemia, unspecified: E78.5

## 2015-07-17 HISTORY — DX: Unspecified fall, initial encounter: W19.XXXA

## 2015-07-17 HISTORY — DX: Other abnormalities of gait and mobility: R26.89

## 2015-07-17 HISTORY — DX: Weakness: R53.1

## 2015-07-17 LAB — CBC
HCT: 37.6 % — ABNORMAL LOW (ref 39.0–52.0)
Hemoglobin: 12.7 g/dL — ABNORMAL LOW (ref 13.0–17.0)
MCH: 30.5 pg (ref 26.0–34.0)
MCHC: 33.8 g/dL (ref 30.0–36.0)
MCV: 90.2 fL (ref 78.0–100.0)
PLATELETS: 189 10*3/uL (ref 150–400)
RBC: 4.17 MIL/uL — AB (ref 4.22–5.81)
RDW: 13.2 % (ref 11.5–15.5)
WBC: 7.6 10*3/uL (ref 4.0–10.5)

## 2015-07-17 LAB — BASIC METABOLIC PANEL
Anion gap: 12 (ref 5–15)
BUN: 36 mg/dL — AB (ref 6–20)
CALCIUM: 9.4 mg/dL (ref 8.9–10.3)
CO2: 27 mmol/L (ref 22–32)
CREATININE: 1.47 mg/dL — AB (ref 0.61–1.24)
Chloride: 98 mmol/L — ABNORMAL LOW (ref 101–111)
GFR calc Af Amer: 47 mL/min — ABNORMAL LOW (ref >60)
GFR, EST NON AFRICAN AMERICAN: 41 mL/min — AB (ref >60)
GLUCOSE: 128 mg/dL — AB (ref 65–99)
Potassium: 3.7 mmol/L (ref 3.5–5.1)
SODIUM: 137 mmol/L (ref 135–145)

## 2015-07-17 LAB — TSH: TSH: 2.908 u[IU]/mL (ref 0.350–4.500)

## 2015-07-17 LAB — SEDIMENTATION RATE: SED RATE: 37 mm/h — AB (ref 0–16)

## 2015-07-17 LAB — RPR: RPR Ser Ql: NONREACTIVE

## 2015-07-17 LAB — PREALBUMIN: PREALBUMIN: 16.4 mg/dL — AB (ref 18–38)

## 2015-07-17 LAB — HIV ANTIBODY (ROUTINE TESTING W REFLEX): HIV SCREEN 4TH GENERATION: NONREACTIVE

## 2015-07-17 LAB — C-REACTIVE PROTEIN: CRP: 0.7 mg/dL (ref <1.0)

## 2015-07-17 MED ORDER — GUAIFENESIN ER 600 MG PO TB12: 600.0000 mg | ORAL_TABLET | Freq: Two times a day (BID) | ORAL | Status: DC

## 2015-07-17 MED ORDER — BISACODYL 5 MG PO TBEC
5.0000 mg | DELAYED_RELEASE_TABLET | Freq: Every day | ORAL | Status: DC | PRN
  Administered 2015-07-20: 5 mg via ORAL
  Filled 2015-07-17: qty 1

## 2015-07-17 MED ORDER — DEXTROSE 5 % IV SOLN
1.0000 g | INTRAVENOUS | Status: DC
  Administered 2015-07-17 – 2015-07-20 (×5): 1 g via INTRAVENOUS
  Filled 2015-07-17 (×7): qty 10

## 2015-07-17 MED ORDER — GUAIFENESIN ER 600 MG PO TB12
600.0000 mg | ORAL_TABLET | Freq: Two times a day (BID) | ORAL | Status: DC
  Administered 2015-07-17 – 2015-07-20 (×5): 600 mg via ORAL
  Filled 2015-07-17 (×10): qty 1

## 2015-07-17 MED ORDER — ONDANSETRON HCL 4 MG PO TABS: 4.0000 mg | ORAL_TABLET | Freq: Four times a day (QID) | ORAL | Status: DC | PRN

## 2015-07-17 MED ORDER — HYDROCODONE-ACETAMINOPHEN 5-325 MG PO TABS
1.0000 | ORAL_TABLET | ORAL | Status: AC
  Administered 2015-07-17: 1 via ORAL
  Filled 2015-07-17: qty 1

## 2015-07-17 MED ORDER — SODIUM CHLORIDE 0.9 % IV SOLN: INTRAVENOUS | Status: DC

## 2015-07-17 MED ORDER — DIGOXIN 125 MCG PO TABS
0.2500 mg | ORAL_TABLET | Freq: Every morning | ORAL | Status: DC
  Administered 2015-07-17 – 2015-07-21 (×5): 0.25 mg via ORAL
  Filled 2015-07-17 (×5): qty 2

## 2015-07-17 MED ORDER — ACETAMINOPHEN 325 MG PO TABS: 650.0000 mg | ORAL_TABLET | Freq: Four times a day (QID) | ORAL | Status: DC | PRN

## 2015-07-17 MED ORDER — PRAVASTATIN SODIUM 20 MG PO TABS
20.0000 mg | ORAL_TABLET | Freq: Every evening | ORAL | Status: DC
  Administered 2015-07-17 – 2015-07-20 (×4): 20 mg via ORAL
  Filled 2015-07-17 (×5): qty 1

## 2015-07-17 MED ORDER — LEVALBUTEROL HCL 0.63 MG/3ML IN NEBU: 0.6300 mg | INHALATION_SOLUTION | Freq: Four times a day (QID) | RESPIRATORY_TRACT | Status: DC | PRN

## 2015-07-17 MED ORDER — SODIUM CHLORIDE 0.9 % IV SOLN
INTRAVENOUS | Status: DC
  Administered 2015-07-17 – 2015-07-20 (×5): via INTRAVENOUS

## 2015-07-17 MED ORDER — CLOPIDOGREL BISULFATE 75 MG PO TABS
75.0000 mg | ORAL_TABLET | Freq: Every day | ORAL | Status: DC
  Administered 2015-07-17: 75 mg via ORAL
  Filled 2015-07-17: qty 1

## 2015-07-17 MED ORDER — ACETAMINOPHEN 650 MG RE SUPP: 650.0000 mg | Freq: Four times a day (QID) | RECTAL | Status: DC | PRN

## 2015-07-17 MED ORDER — ONDANSETRON HCL 4 MG/2ML IJ SOLN
4.0000 mg | Freq: Four times a day (QID) | INTRAMUSCULAR | Status: DC | PRN
  Administered 2015-07-17: 4 mg via INTRAVENOUS
  Filled 2015-07-17: qty 2

## 2015-07-17 MED ORDER — POLYETHYLENE GLYCOL 3350 17 G PO PACK
17.0000 g | PACK | ORAL | Status: DC
  Administered 2015-07-17 – 2015-07-21 (×3): 17 g via ORAL
  Filled 2015-07-17 (×4): qty 1

## 2015-07-17 MED ORDER — HEPARIN SODIUM (PORCINE) 5000 UNIT/ML IJ SOLN
5000.0000 [IU] | Freq: Three times a day (TID) | INTRAMUSCULAR | Status: DC
  Administered 2015-07-17 – 2015-07-18 (×3): 5000 [IU] via SUBCUTANEOUS
  Filled 2015-07-17 (×4): qty 1

## 2015-07-17 MED ORDER — NITROGLYCERIN 0.4 MG SL SUBL: 0.4000 mg | SUBLINGUAL_TABLET | SUBLINGUAL | Status: DC | PRN

## 2015-07-17 MED ORDER — DUTASTERIDE 0.5 MG PO CAPS
0.5000 mg | ORAL_CAPSULE | ORAL | Status: DC
  Administered 2015-07-17 – 2015-07-21 (×3): 0.5 mg via ORAL
  Filled 2015-07-17 (×3): qty 1

## 2015-07-17 MED ORDER — HYDROCODONE-ACETAMINOPHEN 5-325 MG PO TABS
1.0000 | ORAL_TABLET | Freq: Four times a day (QID) | ORAL | Status: DC | PRN
  Administered 2015-07-20: 1 via ORAL
  Filled 2015-07-17: qty 1

## 2015-07-17 MED ORDER — MAGIC MOUTHWASH W/LIDOCAINE
2.0000 mL | Freq: Three times a day (TID) | ORAL | Status: DC | PRN
  Administered 2015-07-17 – 2015-07-18 (×3): 2 mL via ORAL
  Filled 2015-07-17 (×7): qty 5

## 2015-07-17 MED ORDER — FLUTICASONE PROPIONATE 50 MCG/ACT NA SUSP
2.0000 | Freq: Every day | NASAL | Status: DC
  Administered 2015-07-20: 2 via NASAL
  Filled 2015-07-17: qty 16

## 2015-07-17 MED ORDER — SODIUM CHLORIDE 0.9 % IV BOLUS (SEPSIS)
500.0000 mL | Freq: Once | INTRAVENOUS | Status: AC
  Administered 2015-07-17: 500 mL via INTRAVENOUS

## 2015-07-17 MED ORDER — MORPHINE SULFATE (PF) 2 MG/ML IV SOLN
1.0000 mg | INTRAVENOUS | Status: DC | PRN
  Administered 2015-07-18: 1 mg via INTRAVENOUS
  Filled 2015-07-17: qty 1

## 2015-07-17 MED ORDER — ENSURE ENLIVE PO LIQD
237.0000 mL | Freq: Three times a day (TID) | ORAL | Status: DC
  Administered 2015-07-17 – 2015-07-21 (×12): 237 mL via ORAL

## 2015-07-17 NOTE — Progress Notes (Addendum)
[facility] PROGRESS NOTE  Cody [name] [id number] DOB: Mar 13, 1925 DOA: 07/16/2015 PCP: Donnajean Lopes, MD  Summary 82 yo male, lives alone presented after a fall with resulting scalp laceration. Has had weight loss, sinus drainage and tongue pain. Found to have UTI and AKI. Emaciated on exam  Assessment/Plan:  Principal Problem:   Fall: PT rec SNF. Will consult SW Active Problems: UTI: cont rocephin   Weakness secondary to UTI, weight loss, DH   Failure to thrive in adult, weight loss. Concerned about malignancy. Weight loss preceded tounge pain. Will check PSA, CT chest abdomen pelvis. TSH ok. Check dig level   History of CVA (cerebrovascular accident)   AKI (acute kidney injury) ([place]) secondary to prerenal azotemia. Continue IVF   Occipital scalp laceration stapled   BPH (benign prostatic hyperplasia)   HLD (hyperlipidemia)   Protein-calorie malnutrition, severe toungue pain. No lesion seen on exam. Continue magic mouthwash Acute sinusitis: add flonase  Code Status:  full Family Communication:  Brother by phone Disposition Plan:  SNF once weight loss worked up  Consultants:    Procedures:     Antibiotics:  ceftriaxone  HPI/Subjective: C/o tongue pain x 5 days. Weight loss started about 6 weeks ago  Objective: Filed Vitals:   07/17/15 0400 07/17/15 1500  BP: 101/49 90/52  Pulse: 59 94  Temp:  97.7 F (36.5 C)  Resp:  16    Intake/Output Summary (Last 24 hours) at 07/17/15 2036 Last data filed at 07/17/15 1900  Gross per 24 hour  Intake    350 ml  Output    150 ml  Net    200 ml   Filed Weights   07/17/15 1315  Weight: 56.7 kg (125 lb)    Exam:   General:  A and o. Cachectic  HEENT: MMM, no ulcer, lesion noted  Neck: supple. No LA, no thyromegaly  Cardiovascular: RRR without MGR  Respiratory: CTA without WRR  Abdomen: scaphoid. S, nt, nd,  Ext: no CCE  Basic Metabolic Panel:  Recent Labs Lab 07/16/15 2239  07/17/15 0327  NA 137 137  K 3.8 3.7  CL 99* 98*  CO2 24 27  GLUCOSE 123* 128*  BUN 38* 36*  CREATININE 1.47* 1.47*  CALCIUM 9.8 9.4   Liver Function Tests: No results for input(s): AST, ALT, ALKPHOS, BILITOT, PROT, ALBUMIN in the last 168 hours. No results for input(s): LIPASE, AMYLASE in the last 168 hours. No results for input(s): AMMONIA in the last 168 hours. CBC:  Recent Labs Lab 07/16/15 2239 07/17/15 0327  WBC 9.5 7.6  NEUTROABS 8.0*  --   HGB 13.6 12.7*  HCT 39.0 37.6*  MCV 90.5 90.2  PLT 205 189   Cardiac Enzymes: No results for input(s): CKTOTAL, CKMB, CKMBINDEX, TROPONINI in the last 168 hours. BNP (last 3 results) No results for input(s): BNP in the last 8760 hours.  ProBNP (last 3 results) No results for input(s): PROBNP in the last 8760 hours.  CBG: No results for input(s): GLUCAP in the last 168 hours.  No results found for this or any previous visit (from the past 240 hour(s)).   Studies: Ct Head Wo Contrast  07/16/2015  CLINICAL DATA:  82 year old male with fall EXAM: CT HEAD WITHOUT CONTRAST CT CERVICAL SPINE WITHOUT CONTRAST TECHNIQUE: Multidetector CT imaging of the head and cervical spine was performed following the standard protocol without intravenous contrast. Multiplanar CT image reconstructions of the cervical spine were also generated. COMPARISON:  CT dated 09/20/2005 and MRI  dated 10/22/2005 FINDINGS: CT HEAD FINDINGS There is slight prominence of the ventricles and sulci compatible with age-related volume loss. Mild periventricular and deep white matter hypodensities represent chronic microvascular ischemic changes. Focal right posterior parietal parenchymal hypodensity compatible with an old infarct. There is no intracranial hemorrhage. No mass effect or midline shift identified. Mild mucoperiosteal thickening of paranasal sinuses. No air-fluid level. The mastoid air cells are well aerated. There is posterior scalp laceration and hematoma. The  calvarium is intact. CT CERVICAL SPINE FINDINGS There is no acute fracture or subluxation of the cervical spine.There is osteopenia with multilevel degenerative changes most prominent at C5-C6, and C6-C7 where there is endplate irregularity and disc space narrowing.The odontoid and spinous processes are intact.There is normal anatomic alignment of the C1-C2 lateral masses. The visualized soft tissues appear unremarkable. IMPRESSION: No acute intracranial hemorrhage. Age-related atrophy and chronic microvascular ischemic changes. No acute/traumatic cervical spine pathology. Electronically Signed   By: Anner Crete M.D.   On: 07/16/2015 21:16   Ct Cervical Spine Wo Contrast  07/16/2015  CLINICAL DATA:  82 year old male with fall EXAM: CT HEAD WITHOUT CONTRAST CT CERVICAL SPINE WITHOUT CONTRAST TECHNIQUE: Multidetector CT imaging of the head and cervical spine was performed following the standard protocol without intravenous contrast. Multiplanar CT image reconstructions of the cervical spine were also generated. COMPARISON:  CT dated 09/20/2005 and MRI dated 10/22/2005 FINDINGS: CT HEAD FINDINGS There is slight prominence of the ventricles and sulci compatible with age-related volume loss. Mild periventricular and deep white matter hypodensities represent chronic microvascular ischemic changes. Focal right posterior parietal parenchymal hypodensity compatible with an old infarct. There is no intracranial hemorrhage. No mass effect or midline shift identified. Mild mucoperiosteal thickening of paranasal sinuses. No air-fluid level. The mastoid air cells are well aerated. There is posterior scalp laceration and hematoma. The calvarium is intact. CT CERVICAL SPINE FINDINGS There is no acute fracture or subluxation of the cervical spine.There is osteopenia with multilevel degenerative changes most prominent at C5-C6, and C6-C7 where there is endplate irregularity and disc space narrowing.The odontoid and spinous  processes are intact.There is normal anatomic alignment of the C1-C2 lateral masses. The visualized soft tissues appear unremarkable. IMPRESSION: No acute intracranial hemorrhage. Age-related atrophy and chronic microvascular ischemic changes. No acute/traumatic cervical spine pathology. Electronically Signed   By: Anner Crete M.D.   On: 07/16/2015 21:16   Dg Chest Port 1 View  07/17/2015  CLINICAL DATA:  Acute onset of cough.  Initial encounter. EXAM: PORTABLE CHEST 1 VIEW COMPARISON:  Chest radiograph performed 03/13/2013 FINDINGS: The lungs are hyperexpanded, with flattening of the hemidiaphragms, likely reflecting COPD. There is no evidence of focal opacification, pleural effusion or pneumothorax. The cardiomediastinal silhouette is within normal limits. No acute osseous abnormalities are seen. Clips are noted overlying the left axilla. IMPRESSION: Findings suggest COPD.  Lungs otherwise grossly clear. Electronically Signed   By: Garald Balding M.D.   On: 07/17/2015 02:00    Scheduled Meds: . cefTRIAXone (ROCEPHIN)  IV  1 g Intravenous Q24H  . clopidogrel  75 mg Oral Daily  . digoxin  0.25 mg Oral q morning - 10a  . dutasteride  0.5 mg Oral QODAY  . feeding supplement (ENSURE ENLIVE)  237 mL Oral TID BM  . fluticasone  2 spray Each Nare Daily  . guaiFENesin  600 mg Oral BID  . heparin  5,000 Units Subcutaneous 3 times per day  . polyethylene glycol  17 g Oral QODAY  . pravastatin  20  mg Oral QPM   Continuous Infusions: . sodium chloride    . sodium chloride 75 mL/hr at 07/17/15 1000    Time spent: 25 minutes  [facility] [email or web address], password [email or web address] 07/17/2015, 8:36 PM  LOS: 0 days

## 2015-07-17 NOTE — Progress Notes (Signed)
Initial Nutrition Assessment  DOCUMENTATION CODES:   Severe malnutrition in context of chronic illness, Underweight  INTERVENTION:  Provide Ensure Enlive po TID, each supplement provides 350 kcal and 20 grams of protein.  Encourage adequate PO intake.   NUTRITION DIAGNOSIS:   Malnutrition related to  (illness) as evidenced by severe depletion of body fat, severe depletion of muscle mass.  GOAL:   Patient will meet greater than or equal to 90% of their needs  MONITOR:   PO intake, Supplement acceptance, Weight trends, Labs, I & O's  REASON FOR ASSESSMENT:   Consult  (Pt with weight loss)  ASSESSMENT:   82 year old male with a past medical history of HTN, BPH, left carotid bruit, previous CVA with residual balance issues; who presents after having a fall   Meal completion 0%. Pt reports having a lack of appetite which has been ongoing over the past 1 month due to tongue pain (thrush?) and nausea from nasal drainage. Pt reports however still consumes at least 3 meals a day. Pt reports last meal consumed was yesterday morning, which was milk and cereal. He does report consuming an Ensure last night, which he reports does not cause tongue pain as much. RD to order Ensure to aid in caloric and protein needs. Pt was educated to continue supplementation at home if po intake is poor. Pt reports usual body weight of ~145 lbs. Pt with a 13.7% weight loss in 1 month. Pt was encouraged to try to consume his food at meals.   Nutrition-Focused physical exam completed. Findings are severe fat depletion, severe muscle depletion, and no edema.   Labs and medications reviewed.   Diet Order:  Diet Heart Room service appropriate?: Yes; Fluid consistency:: Thin  Skin:   (Abrasion on elbow and knee)  Last BM:  1/18  Height:   Ht Readings from Last 1 Encounters:  02/18/15 6\' 2"  (1.88 m)    Weight:   Wt Readings from Last 1 Encounters:  07/17/15 125 lb (56.7 kg)    Ideal Body Weight:   86.36 kg  BMI:  Body mass index is 16.04 kg/(m^2).  Estimated Nutritional Needs:   Kcal:  1850-2000  Protein:  85-95 grams  Fluid:  1.8 - 2 L/day  EDUCATION NEEDS:   Education needs addressed  Corrin Parker, MS, RD, LDN Pager # [phone] After hours/ weekend pager # [phone]

## 2015-07-17 NOTE — Progress Notes (Addendum)
ANTIBIOTIC CONSULT NOTE - INITIAL  Pharmacy Consult for Rocephin Indication: UTI   Labs:  Recent Labs  07/16/15 2239  WBC 9.5  HGB 13.6  PLT 205  CREATININE 1.47*     Assessment/Plan:  82yo male presents w/ s/p fall that resulted in head laceration, laceration closed in ED but screening labs show new renal insufficiency, to be admitted for further w/u.  UA found to be abnormal, to begin IV ABX.  Will start Rocephin 1g IV Q24H and monitor CBC and Cx.  Wynona Neat, PharmD, BCPS  07/17/2015,1:52 AM   ====================   Addendum: - Pharmacy will sign off as dosage adjustment is unnecessary for UTI.  Please reconsult if needed.     Orell Hurtado D. Mina Marble, PharmD, BCPS Pager:  [phone] 07/17/2015, 10:23 AM

## 2015-07-17 NOTE — Evaluation (Signed)
Physical Therapy Evaluation Patient Details Name: Cody [name] MRN: [id number] DOB: [birth year] Today's Date: 07/17/2015   History of Present Illness  82yo male presents w/ s/p fall that resulted in head laceration, laceration closed in ED but screening labs show new renal insufficiency, to be admitted for further w/u. UA found to be abnormal, to begin IV ABX. PMH: hypertension, CVA (resulting balance deficits)  Clinical Impression  Pt admitted as above. Pt currently with functional limitations due to the deficits listed below (see PT Problem List).  Pt will benefit from skilled PT to increase their independence and safety with mobility to allow discharge to the venue listed below. Based upon the patient current mobility level, anticipating D/C to SNF for further rehabilitation following acute care stay. PT to continue to follow and progress as tolerated.       Follow Up Recommendations SNF;Supervision for mobility/OOB    Equipment Recommendations  Rolling walker with 5" wheels    Recommendations for Other Services       Precautions / Restrictions Precautions Precautions: Fall Precaution Comments: photophobia Restrictions Weight Bearing Restrictions: No      Mobility  Bed Mobility Overal bed mobility: Needs Assistance Bed Mobility: Supine to Sit     Supine to sit: Mod assist     General bed mobility comments: assist provided with trunk and LEs  Transfers Overall transfer level: Needs assistance Equipment used: Rolling walker (2 wheeled) Transfers: Sit to/from Stand Sit to Stand: Mod assist         General transfer comment: X1 from bed and X1 from chair, cues for hand position and safety  Ambulation/Gait Ambulation/Gait assistance: Min assist Ambulation Distance (Feet): 6 Feet Assistive device: Rolling walker (2 wheeled) Gait Pattern/deviations: Decreased step length - right;Decreased step length - left;Step-through pattern     General Gait Details: narrow  base of supports and small strides bilaterally, assist to maneuver rw.   Stairs            Wheelchair Mobility    Modified Rankin (Stroke Patients Only)       Balance Overall balance assessment: Needs assistance Sitting-balance support: No upper extremity supported Sitting balance-Leahy Scale: Fair     Standing balance support: Bilateral upper extremity supported Standing balance-Leahy Scale: Poor Standing balance comment: using rw                             Pertinent Vitals/Pain Pain Assessment: 0-10 Pain Score: 9  Pain Location: tongue Pain Descriptors / Indicators: Other (Comment) (painful) Pain Intervention(s): Monitored during session    Home Living Family/patient expects to be discharged to:: Skilled nursing facility Living Arrangements: Alone Available Help at Discharge: Other (Comment) (none) Type of Home: House Home Access: Stairs to enter   [employer] of Steps: 2-3 Home Layout: One level Home Equipment: Cane - single point      Prior Function Level of Independence: Independent with assistive device(s)         Comments: previously using SPC with ambulation     Hand Dominance        Extremity/Trunk Assessment   Upper Extremity Assessment: Generalized weakness           Lower Extremity Assessment: Generalized weakness         Communication   Communication: No difficulties  Cognition Arousal/Alertness: Awake/alert Behavior During Therapy: WFL for tasks assessed/performed Overall Cognitive Status: Within Functional Limits for tasks assessed  General Comments      Exercises        Assessment/Plan    PT Assessment Patient needs continued PT services  PT Diagnosis Difficulty walking;Generalized weakness   PT Problem List Decreased strength;Decreased activity tolerance;Decreased balance;Decreased mobility;Decreased knowledge of use of DME;Decreased safety awareness  PT  Treatment Interventions DME instruction;Gait training;Functional mobility training;Stair training;Therapeutic activities;Therapeutic exercise;Balance training;Patient/family education   PT Goals (Current goals can be found in the Care Plan section) Acute Rehab PT Goals Patient Stated Goal: Get stronger again PT Goal Formulation: With patient Time For Goal Achievement: 07/31/15 Potential to Achieve Goals: Good    Frequency Min 3X/week   Barriers to discharge Decreased caregiver support      Co-evaluation               End of Session Equipment Utilized During Treatment: Gait belt Activity Tolerance: Patient tolerated treatment well;Patient limited by fatigue Patient left: in chair;with call bell/phone within reach;with chair alarm set;with family/visitor present Nurse Communication: Mobility status         Time: 1325-1401 PT Time Calculation (min) (ACUTE ONLY): 36 min   Charges:   PT Evaluation $PT Eval Moderate Complexity: 1 Procedure PT Treatments $Therapeutic Activity: 8-22 mins   PT G Codes:        Cassell Clement, PT, CSCS Pager [phone] Office [phone]  07/17/2015, 3:34 PM

## 2015-07-18 ENCOUNTER — Inpatient Hospital Stay (HOSPITAL_COMMUNITY): Payer: Medicare Other

## 2015-07-18 DIAGNOSIS — N179 Acute kidney failure, unspecified: Principal | ICD-10-CM

## 2015-07-18 DIAGNOSIS — R29818 Other symptoms and signs involving the nervous system: Secondary | ICD-10-CM

## 2015-07-18 DIAGNOSIS — W19XXXA Unspecified fall, initial encounter: Secondary | ICD-10-CM

## 2015-07-18 DIAGNOSIS — N400 Enlarged prostate without lower urinary tract symptoms: Secondary | ICD-10-CM

## 2015-07-18 LAB — DIGOXIN LEVEL: DIGOXIN LVL: 0.9 ng/mL (ref 0.8–2.0)

## 2015-07-18 LAB — BASIC METABOLIC PANEL
ANION GAP: 9 (ref 5–15)
BUN: 41 mg/dL — ABNORMAL HIGH (ref 6–20)
CALCIUM: 8.6 mg/dL — AB (ref 8.9–10.3)
CO2: 27 mmol/L (ref 22–32)
CREATININE: 1.66 mg/dL — AB (ref 0.61–1.24)
Chloride: 103 mmol/L (ref 101–111)
GFR, EST AFRICAN AMERICAN: 41 mL/min — AB (ref >60)
GFR, EST NON AFRICAN AMERICAN: 35 mL/min — AB (ref >60)
Glucose, Bld: 75 mg/dL (ref 65–99)
Potassium: 3.6 mmol/L (ref 3.5–5.1)
SODIUM: 139 mmol/L (ref 135–145)

## 2015-07-18 LAB — PSA: PSA: 4.25 ng/mL — ABNORMAL HIGH (ref 0.00–4.00)

## 2015-07-18 MED ORDER — BENZOCAINE 10 % MT GEL
Freq: Four times a day (QID) | OROMUCOSAL | Status: DC | PRN
  Administered 2015-07-18: 13:00:00 via OROMUCOSAL
  Administered 2015-07-18: 1 via OROMUCOSAL
  Filled 2015-07-18: qty 9.4

## 2015-07-18 MED ORDER — IOHEXOL 300 MG/ML  SOLN: 50.0000 mL | Freq: Once | INTRAMUSCULAR | Status: DC | PRN

## 2015-07-18 MED ORDER — BENZOCAINE 10 % MT GEL
OROMUCOSAL | Status: DC | PRN
  Administered 2015-07-18 – 2015-07-21 (×4): via OROMUCOSAL
  Filled 2015-07-18: qty 9.4

## 2015-07-18 NOTE — Progress Notes (Addendum)
Oragel placed earlier this am, placed to R lower jaw past molars, area very painful to pt, noted qtip was rubbing something rough in the area and noted it was a broken pieces of tooth in the tissue--not loose to remove.   First dose of oragel did not help much pt stated, pt directed nurse to place med to inside area of his tongue next to area had placed before, now noted this piece of tooth has been rubbing his tongue raw--area to the inside of his tongue is white, rubbed off pink tissue of his tongue.  Coated this area well with oragel, pt able to eat a few bites of his peaches now and could tell a difference in pain, "not all gone, but much better" pt stated.    Pt more concerned with "fixing" his mouth problem at this time, than getting CT scans that are ordered. He wants to fix this problem first then be concerned if "I have cancer or not".   Dr Hartford Poli notified of the above, plan to CT scan mouth/face and xray teeth/mouth. Consult oral dentist or surgeon, and d/c CT scans of chest, abd and pelvis at this time.   Pt informed of the plan, pt pleased with plan at this time.

## 2015-07-18 NOTE — Progress Notes (Signed)
Spoke with Dr Hartford Poli, he will see pt and will talk with pt's brother also for more info on pt history/home life.  Try pain medication to help with oral pain to see if helps with pt taking in po contrast, (pt did have MMW this am prior to taking oral contrast--no help),  Will increase IVF and hold on plavix now due to pt having bloody urine, (reported to on call MD/PA early am by night RN and heparin SQ was stopped).

## 2015-07-18 NOTE — Progress Notes (Signed)
[facility] PROGRESS NOTE   Cody [name] [id number] DOB: August 10, 1924 DOA: 07/16/2015 PCP: Donnajean Lopes, MD  HPI/Subjective: Patient is having oral pain, had gross hematuria.  Assessment/Plan: Principal Problem:   Fall Active Problems:   Weakness   Failure to thrive in adult   Balance problem   History of CVA (cerebrovascular accident)   AKI (acute kidney injury) ([place])   Occipital scalp laceration   BPH (benign prostatic hyperplasia)   HLD (hyperlipidemia)   Protein-calorie malnutrition, severe    UTI On admission urinalysis consistent with UTI, started on Rocephin. Continue Rocephin, just antibiotics according to culture results.  Acute kidney injury Patient presented with acute kidney injury, creatinine baseline from 2014 and 0.95, presented with creatinine of 1.47. Has poor oral intake secondary to mouth pain, started on IV fluids. Check BMP in a.m.  Fall Status post fall, with laceration in the back of his head status post stapling in the ED. Does have balance problems, did not lose his consciousness. No further workup in terms of syncope.  Oral pain Has pain in the right side of his mouth, initially said it is his tongue, it might be the gum around the last molar. Obtain CT of the mouth to rule out oral abscesses. Meanwhile start Orajel to control the pain patient is on morphine and hydrocodone.  Hematuria Patient has gross hematuria, patient on Plavix and subcutaneous heparin added in the hospital. Hold both, no recent instrumentation or catheterization.  Deconditioning Patient has failure to thrive and deconditioning, seen by PT and recommended SNF. CSW to evaluate for SNF placement  History of CVA Patient has CVA about 10 years ago, has balance issues since then and is on Plavix for that. Hold on Plavix because of concurrent hematuria.   Code Status: Full Code Family Communication: Plan discussed with the patient. Disposition Plan:  Remains inpatient Diet: Diet Heart Room service appropriate?: Yes; Fluid consistency:: Thin  Consultants:  None  Procedures:  None  Antibiotics:  Rocephin   Objective: Filed Vitals:   07/17/15 2236 07/18/15 0602  BP: 105/46 108/44  Pulse: 66 67  Temp: 97.7 F (36.5 C) 98 F (36.7 C)  Resp: 16 16    Intake/Output Summary (Last 24 hours) at 07/18/15 1212 Last data filed at 07/18/15 [id number]  Gross per 24 hour  Intake 1791.25 ml  Output    550 ml  Net 1241.25 ml   Filed Weights   07/17/15 1315  Weight: 56.7 kg (125 lb)    Exam: General: Alert and awake, oriented x3, not in any acute distress. HEENT: anicteric sclera, pupils reactive to light and accommodation, EOMI CVS: S1-S2 clear, no murmur rubs or gallops Chest: clear to auscultation bilaterally, no wheezing, rales or rhonchi Abdomen: soft nontender, nondistended, normal bowel sounds, no organomegaly Extremities: no cyanosis, clubbing or edema noted bilaterally Neuro: Cranial nerves II-XII intact, no focal neurological deficits  Data Reviewed: Basic Metabolic Panel:  Recent Labs Lab 07/16/15 2239 07/17/15 0327 07/18/15 0418  NA 137 137 139  K 3.8 3.7 3.6  CL 99* 98* 103  CO2 24 27 27   GLUCOSE 123* 128* 75  BUN 38* 36* 41*  CREATININE 1.47* 1.47* 1.66*  CALCIUM 9.8 9.4 8.6*   Liver Function Tests: No results for input(s): AST, ALT, ALKPHOS, BILITOT, PROT, ALBUMIN in the last 168 hours. No results for input(s): LIPASE, AMYLASE in the last 168 hours. No results for input(s): AMMONIA in the last 168 hours. CBC:  Recent Labs Lab 07/16/15 2239 07/17/15 0327  WBC 9.5 7.6  NEUTROABS 8.0*  --   HGB 13.6 12.7*  HCT 39.0 37.6*  MCV 90.5 90.2  PLT 205 189   Cardiac Enzymes: No results for input(s): CKTOTAL, CKMB, CKMBINDEX, TROPONINI in the last 168 hours. BNP (last 3 results) No results for input(s): BNP in the last 8760 hours.  ProBNP (last 3 results) No results for input(s): PROBNP in the  last 8760 hours.  CBG: No results for input(s): GLUCAP in the last 168 hours.  Micro No results found for this or any previous visit (from the past 240 hour(s)).   Studies: Ct Head Wo Contrast  07/16/2015  CLINICAL DATA:  82 year old male with fall EXAM: CT HEAD WITHOUT CONTRAST CT CERVICAL SPINE WITHOUT CONTRAST TECHNIQUE: Multidetector CT imaging of the head and cervical spine was performed following the standard protocol without intravenous contrast. Multiplanar CT image reconstructions of the cervical spine were also generated. COMPARISON:  CT dated 09/20/2005 and MRI dated 10/22/2005 FINDINGS: CT HEAD FINDINGS There is slight prominence of the ventricles and sulci compatible with age-related volume loss. Mild periventricular and deep white matter hypodensities represent chronic microvascular ischemic changes. Focal right posterior parietal parenchymal hypodensity compatible with an old infarct. There is no intracranial hemorrhage. No mass effect or midline shift identified. Mild mucoperiosteal thickening of paranasal sinuses. No air-fluid level. The mastoid air cells are well aerated. There is posterior scalp laceration and hematoma. The calvarium is intact. CT CERVICAL SPINE FINDINGS There is no acute fracture or subluxation of the cervical spine.There is osteopenia with multilevel degenerative changes most prominent at C5-C6, and C6-C7 where there is endplate irregularity and disc space narrowing.The odontoid and spinous processes are intact.There is normal anatomic alignment of the C1-C2 lateral masses. The visualized soft tissues appear unremarkable. IMPRESSION: No acute intracranial hemorrhage. Age-related atrophy and chronic microvascular ischemic changes. No acute/traumatic cervical spine pathology. Electronically Signed   By: Anner Crete M.D.   On: 07/16/2015 21:16   Ct Cervical Spine Wo Contrast  07/16/2015  CLINICAL DATA:  82 year old male with fall EXAM: CT HEAD WITHOUT CONTRAST CT  CERVICAL SPINE WITHOUT CONTRAST TECHNIQUE: Multidetector CT imaging of the head and cervical spine was performed following the standard protocol without intravenous contrast. Multiplanar CT image reconstructions of the cervical spine were also generated. COMPARISON:  CT dated 09/20/2005 and MRI dated 10/22/2005 FINDINGS: CT HEAD FINDINGS There is slight prominence of the ventricles and sulci compatible with age-related volume loss. Mild periventricular and deep white matter hypodensities represent chronic microvascular ischemic changes. Focal right posterior parietal parenchymal hypodensity compatible with an old infarct. There is no intracranial hemorrhage. No mass effect or midline shift identified. Mild mucoperiosteal thickening of paranasal sinuses. No air-fluid level. The mastoid air cells are well aerated. There is posterior scalp laceration and hematoma. The calvarium is intact. CT CERVICAL SPINE FINDINGS There is no acute fracture or subluxation of the cervical spine.There is osteopenia with multilevel degenerative changes most prominent at C5-C6, and C6-C7 where there is endplate irregularity and disc space narrowing.The odontoid and spinous processes are intact.There is normal anatomic alignment of the C1-C2 lateral masses. The visualized soft tissues appear unremarkable. IMPRESSION: No acute intracranial hemorrhage. Age-related atrophy and chronic microvascular ischemic changes. No acute/traumatic cervical spine pathology. Electronically Signed   By: Anner Crete M.D.   On: 07/16/2015 21:16   Dg Chest Port 1 View  07/17/2015  CLINICAL DATA:  Acute onset of cough.  Initial encounter. EXAM: PORTABLE CHEST 1 VIEW COMPARISON:  Chest radiograph performed 03/13/2013  FINDINGS: The lungs are hyperexpanded, with flattening of the hemidiaphragms, likely reflecting COPD. There is no evidence of focal opacification, pleural effusion or pneumothorax. The cardiomediastinal silhouette is within normal limits. No  acute osseous abnormalities are seen. Clips are noted overlying the left axilla. IMPRESSION: Findings suggest COPD.  Lungs otherwise grossly clear. Electronically Signed   By: Garald Balding M.D.   On: 07/17/2015 02:00    Scheduled Meds: . cefTRIAXone (ROCEPHIN)  IV  1 g Intravenous Q24H  . digoxin  0.25 mg Oral q morning - 10a  . dutasteride  0.5 mg Oral QODAY  . feeding supplement (ENSURE ENLIVE)  237 mL Oral TID BM  . fluticasone  2 spray Each Nare Daily  . guaiFENesin  600 mg Oral BID  . polyethylene glycol  17 g Oral QODAY  . pravastatin  20 mg Oral QPM   Continuous Infusions: . sodium chloride 75 mL/hr at 07/17/15 1000       Time spent: 35 minutes    Ms [facility]  [facility] Pager [phone] If 7PM-7AM, please contact night-coverage at [email or web address], password [facility] 07/18/2015, 12:12 PM  LOS: 1 day

## 2015-07-18 NOTE — Clinical Social Work Note (Signed)
CSW received consult for SNF placement.  Patient fixed on "mouth hurting" and declined assessment at this time.  CSW will re-attempt at a later time.  Nonnie Done, LCSW [phone]  5N1-9; 2S 15-16 and [place] Licensed Clinical Social Worker

## 2015-07-18 NOTE — Progress Notes (Signed)
Assisted pt back to bed to go for oral CT scan and xray. Noted blood on pad pt sitting on and on gown. Pt has had some blood from penis sometime today, none noted as new at this time. Will monitor.

## 2015-07-18 NOTE — Progress Notes (Signed)
Physical Therapy Treatment Patient Details Name: Cody [name] MRN: [id number] DOB: [birth year] Today's Date: 07/18/2015    History of Present Illness 82yo male presents w/ s/p fall that resulted in head laceration, laceration closed in ED but screening labs show new renal insufficiency, to be admitted for further w/u. UA found to be abnormal, to begin IV ABX. PMH: hypertension, CVA (resulting balance deficits)    PT Comments    Patient very anxious about falling again and needed encouragement to ambulate in room. Pt is making progress toward mobility goals and has no c/o pain from fall, only the tongue. Continue to progress as tolerated.  Follow Up Recommendations  SNF;Supervision for mobility/OOB     Equipment Recommendations  Rolling walker with 5" wheels    Recommendations for Other Services       Precautions / Restrictions Precautions Precautions: Fall Precaution Comments: photophobia Restrictions Weight Bearing Restrictions: No    Mobility  Bed Mobility Overal bed mobility: Needs Assistance Bed Mobility: Supine to Sit     Supine to sit: Min guard;HOB elevated     General bed mobility comments: vc for sequencing; no physical assist needed; use of bed rails with HOB elevated  Transfers Overall transfer level: Needs assistance Equipment used: Rolling walker (2 wheeled) Transfers: Sit to/from Stand Sit to Stand: Min guard         General transfer comment: min guard for safety from EOB with vc for safe hand placementa and posture upon standing  Ambulation/Gait Ambulation/Gait assistance: Min guard Ambulation Distance (Feet): 10 Feet Assistive device: Rolling walker (2 wheeled) Gait Pattern/deviations: Trunk flexed;Decreased stride length;Step-through pattern;Narrow base of support     General Gait Details: vc to maintain upright posture and for position of RW; pt with tendency to take one hand off of RW and grabbing tongue when c/o pain   Stairs             Wheelchair Mobility    Modified Rankin (Stroke Patients Only)       Balance Overall balance assessment: Needs assistance Sitting-balance support: Feet supported Sitting balance-Leahy Scale: Fair     Standing balance support: Bilateral upper extremity supported Standing balance-Leahy Scale: Poor                      Cognition Arousal/Alertness: Awake/alert Behavior During Therapy: WFL for tasks assessed/performed Overall Cognitive Status: Within Functional Limits for tasks assessed                      Exercises General Exercises - Lower Extremity Long Arc Quad: AROM;Both;10 reps;Seated Hip Flexion/Marching: AROM;Both;10 reps;Seated    General Comments General comments (skin integrity, edema, etc.): pt very anxious about possible fall when ambulating and required encouragement to stand      Pertinent Vitals/Pain Pain Assessment: Faces Faces Pain Scale: Hurts whole lot Pain Location: tongue Pain Descriptors / Indicators: Grimacing;Moaning;Sore Pain Intervention(s): Limited activity within patient's tolerance;Monitored during session    Home Living                      Prior Function            PT Goals (current goals can now be found in the care plan section) Acute Rehab PT Goals Patient Stated Goal: Get stronger again PT Goal Formulation: With patient Time For Goal Achievement: 07/31/15 Potential to Achieve Goals: Good Progress towards PT goals: Progressing toward goals    Frequency  Min 3X/week  PT Plan Current plan remains appropriate    Co-evaluation             End of Session Equipment Utilized During Treatment: Gait belt Activity Tolerance: Patient tolerated treatment well;Patient limited by fatigue Patient left: in chair;with call bell/phone within reach;with chair alarm set     Time: [id number] PT Time Calculation (min) (ACUTE ONLY): 23 min  Charges:  $Gait Training: 8-22 mins $Therapeutic Activity:  8-22 mins                    G Codes:      [place] April, PTA Pager: [phone]   07/18/2015, 9:51 AM

## 2015-07-18 NOTE — Progress Notes (Signed)
Pt unable to drink 2 bottles of contrast ordered for CT abd, pelvis and chest, states he just can't drink it, place in mouth hurts. CT dept notified and Dr Hartford Poli notified, await orders.

## 2015-07-19 NOTE — NC FL2 (Signed)
[place] LEVEL OF CARE SCREENING TOOL     IDENTIFICATION  Patient Name: Cody [name] Birthdate: 04/09/1925 Sex: male Admission Date (Current Location): 07/16/2015  [facility] and [place] Number:  Herbalist and Address:  [facility] [place]. [facility], [place] [address] [place], [place] [address]      Provider Number: [id number]  Attending Physician Name and Address:  Verlee Monte, MD  Relative Name and Phone Number:       Current Level of Care: Hospital Recommended Level of Care: [place] Prior Approval Number:    Date Approved/Denied:   PASRR Number: [id number]  Discharge Plan: SNF    Current Diagnoses: Patient Active Problem List   Diagnosis Date Noted  . Weakness 07/17/2015  . Fall 07/17/2015  . Failure to thrive in adult 07/17/2015  . Balance problem 07/17/2015  . History of CVA (cerebrovascular accident) 07/17/2015  . AKI (acute kidney injury) ([place]) 07/17/2015  . Occipital scalp laceration 07/17/2015  . BPH (benign prostatic hyperplasia) 07/17/2015  . HLD (hyperlipidemia) 07/17/2015  . Protein-calorie malnutrition, severe 07/17/2015  . Inguinal hernia without mention of obstruction or gangrene, unilateral or unspecified, (not specified as recurrent)-right s/p repair with mesh 03/15/13. 01/30/2013  . CVA 10/14/2009  . PERSONAL HX COLONIC POLYPS 10/14/2009    Orientation RESPIRATION BLADDER Height & Weight    Self, Time, Situation, Place  Normal Continent 6\' 2"  (188 cm) 125 lbs.  BEHAVIORAL SYMPTOMS/MOOD NEUROLOGICAL BOWEL NUTRITION STATUS      Continent Diet (Heart healthy)  AMBULATORY STATUS COMMUNICATION OF NEEDS Skin   Limited Assist Verbally Normal                       Personal Care Assistance Level of Assistance  Bathing, Dressing, Feeding Bathing Assistance: Limited assistance Feeding assistance: Independent Dressing Assistance: Limited assistance     Functional Limitations Info  Sight,  Hearing, Speech Sight Info: Impaired Hearing Info: Adequate Speech Info: Adequate    SPECIAL CARE FACTORS FREQUENCY  PT (By licensed PT), OT (By licensed OT)                    Contractures      Additional Factors Info  Code Status Code Status Info: Full Allergies Info: Aspirin, Eggs Or Egg-derived Products           Current Medications (07/19/2015):  This is the current hospital active medication list Current Facility-Administered Medications  Medication Dose Route Frequency Provider Last Rate Last Dose  . 0.9 %  sodium chloride infusion   Intravenous Continuous Verlee Monte, MD 100 mL/hr at 07/18/15 1326    . acetaminophen (TYLENOL) tablet 650 mg  650 mg Oral Q6H PRN Norval Morton, MD       Or  . acetaminophen (TYLENOL) suppository 650 mg  650 mg Rectal Q6H PRN Norval Morton, MD      . benzocaine (ORAJEL) 10 % mucosal gel   Mouth/Throat PRN Verlee Monte, MD      . bisacodyl (DULCOLAX) EC tablet 5 mg  5 mg Oral Daily PRN Norval Morton, MD      . cefTRIAXone (ROCEPHIN) 1 g in dextrose 5 % 50 mL IVPB  1 g Intravenous Q24H Rondell A Smith, MD 100 mL/hr at 07/19/15 0000 1 g at 07/19/15 0000  . digoxin (LANOXIN) tablet 0.25 mg  0.25 mg Oral q morning - 10a Rondell A Smith, MD   0.25 mg at 07/19/15 0930  .  dutasteride (AVODART) capsule 0.5 mg  0.5 mg Oral QODAY Norval Morton, MD   0.5 mg at 07/17/15 1129  . feeding supplement (ENSURE ENLIVE) (ENSURE ENLIVE) liquid 237 mL  237 mL Oral TID BM Dale [place], RD   237 mL at 07/19/15 1000  . fluticasone (FLONASE) 50 MCG/ACT nasal spray 2 spray  2 spray Each Nare Daily Delfina Redwood, MD   2 spray at 07/17/15 1856  . guaiFENesin (MUCINEX) 12 hr tablet 600 mg  600 mg Oral BID Norval Morton, MD   600 mg at 07/18/15 2119  . HYDROcodone-acetaminophen (NORCO/VICODIN) 5-325 MG per tablet 1 tablet  1 tablet Oral Q6H PRN Rondell A Tamala Julian, MD      . iohexol (OMNIPAQUE) 300 MG/ML solution 50 mL  50 mL Oral Once PRN Anner Crete, MD      . levalbuterol (XOPENEX) nebulizer solution 0.63 mg  0.63 mg Nebulization Q6H PRN Norval Morton, MD      . morphine 2 MG/ML injection 1 mg  1 mg Intravenous Q3H PRN Norval Morton, MD   1 mg at 07/18/15 1046  . nitroGLYCERIN (NITROSTAT) SL tablet 0.4 mg  0.4 mg Sublingual Q5 min PRN Norval Morton, MD      . ondansetron (ZOFRAN) tablet 4 mg  4 mg Oral Q6H PRN Norval Morton, MD       Or  . ondansetron (ZOFRAN) injection 4 mg  4 mg Intravenous Q6H PRN Norval Morton, MD   4 mg at 07/17/15 0955  . polyethylene glycol (MIRALAX / GLYCOLAX) packet 17 g  17 g Oral QODAY Rondell A Tamala Julian, MD   17 g at 07/19/15 0930  . pravastatin (PRAVACHOL) tablet 20 mg  20 mg Oral QPM Norval Morton, MD   20 mg at 07/18/15 1814     Discharge Medications: Please see discharge summary for a list of discharge medications.  Relevant Imaging Results:  Relevant Lab Results:   Additional Information    Boone Master, LCSW Weekend Coverage

## 2015-07-19 NOTE — Clinical Social Work Placement (Signed)
   CLINICAL SOCIAL WORK PLACEMENT  NOTE  Date:  07/19/2015  Patient Details  Name: Cody [name] MRN: [id number] Date of Birth: May 14, 1925  Clinical Social Work is seeking post-discharge placement for this patient at the [place] level of care (*CSW will initial, date and re-position this form in  chart as items are completed):  Yes   Patient/family provided with [place] Work Department's list of facilities offering this level of care within the geographic area requested by the patient (or if unable, by the patient's family).  Yes   Patient/family informed of their freedom to choose among providers that offer the needed level of care, that participate in Medicare, Medicaid or managed care program needed by the patient, have an available bed and are willing to accept the patient.  Yes   Patient/family informed of [facility]'s ownership interest in [facility] and [facility], as well as of the fact that they are under no obligation to receive care at these facilities.  PASRR submitted to EDS on 07/19/15     PASRR number received on 07/19/15     Existing PASRR number confirmed on       FL2 transmitted to all facilities in geographic area requested by pt/family on 07/19/15     FL2 transmitted to all facilities within larger geographic area on       Patient informed that his/her managed care company has contracts with or will negotiate with certain facilities, including the following:            Patient/family informed of bed offers received.  Patient chooses bed at       Physician recommends and patient chooses bed at      Patient to be transferred to   on  .  Patient to be transferred to facility by       Patient family notified on   of transfer.  Name of family member notified:        PHYSICIAN       Additional Comment:    _______________________________________________ Boone Master, [place] 07/19/2015, 10:05 AM

## 2015-07-19 NOTE — Progress Notes (Addendum)
[facility] PROGRESS NOTE   Cody [name] [id number] DOB: 11-Jun-1925 DOA: 07/16/2015 PCP: Donnajean Lopes, MD  HPI/Subjective: Main patient complaint is his oral pain. I discussed with the patient, discussed with his brother Alameen Misencik over the phone.  Assessment/Plan: Principal Problem:   Fall Active Problems:   Weakness   Failure to thrive in adult   Balance problem   History of CVA (cerebrovascular accident)   AKI (acute kidney injury) ([place])   Occipital scalp laceration   BPH (benign prostatic hyperplasia)   HLD (hyperlipidemia)   Protein-calorie malnutrition, severe    UTI On admission urinalysis consistent with UTI, started on Rocephin. Continue Rocephin, just antibiotics according to culture results.  Oral pain Appears to have chipped tooth eroding into the lateral side of the tongue causing extreme discomfort.  CT scan done showed no other findings. He is on Rocephin for UTI, continued, probably this is covers possible infections. Meanwhile start Orajel to control the pain patient is on morphine and hydrocodone. I called Dr. Ritta Slot office yesterday, no one is on call for Gen. medicine for the weekend.  Acute kidney injury Patient presented with acute kidney injury, creatinine baseline from 2014 and 0.95, presented with creatinine of 1.47. Has poor oral intake secondary to mouth pain, continue IV fluids Check BMP in a.m.  Fall Status post fall, with laceration in the back of his head status post stapling in the ED. Does have balance problems, did not lose his consciousness. No further workup in terms of syncope.  Hematuria Patient has gross hematuria, patient on Plavix and subcutaneous heparin added in the hospital. Hold both, no recent instrumentation or catheterization.  this is better today.   Deconditioning Patient has failure to thrive and deconditioning, seen by PT and recommended SNF. CSW to evaluate for SNF placement  History of  CVA Patient has CVA about 10 years ago, has balance issues since then and is on Plavix for that. Hold on Plavix because of concurrent hematuria.   Code Status: Full Code Family Communication: Plan discussed with the patient. Disposition Plan: Remains inpatient Diet: Diet Heart Room service appropriate?: Yes; Fluid consistency:: Thin  Consultants:  None  Procedures:  None  Antibiotics:  Rocephin   Objective: Filed Vitals:   07/18/15 2001 07/19/15 0547  BP: 113/56 112/58  Pulse: 77 79  Temp: 97.3 F (36.3 C) 97.3 F (36.3 C)  Resp: 18 18    Intake/Output Summary (Last 24 hours) at 07/19/15 1238 Last data filed at 07/18/15 2259  Gross per 24 hour  Intake 1635.42 ml  Output    625 ml  Net 1010.42 ml   Filed Weights   07/17/15 1315  Weight: 56.7 kg (125 lb)    Exam: General: Alert and awake, oriented x3, not in any acute distress. HEENT: anicteric sclera, pupils reactive to light and accommodation, EOMI CVS: S1-S2 clear, no murmur rubs or gallops Chest: clear to auscultation bilaterally, no wheezing, rales or rhonchi Abdomen: soft nontender, nondistended, normal bowel sounds, no organomegaly Extremities: no cyanosis, clubbing or edema noted bilaterally Neuro: Cranial nerves II-XII intact, no focal neurological deficits  Data Reviewed: Basic Metabolic Panel:  Recent Labs Lab 07/16/15 2239 07/17/15 0327 07/18/15 0418  NA 137 137 139  K 3.8 3.7 3.6  CL 99* 98* 103  CO2 24 27 27   GLUCOSE 123* 128* 75  BUN 38* 36* 41*  CREATININE 1.47* 1.47* 1.66*  CALCIUM 9.8 9.4 8.6*   Liver Function Tests: No results for input(s): AST, ALT, ALKPHOS,  BILITOT, PROT, ALBUMIN in the last 168 hours. No results for input(s): LIPASE, AMYLASE in the last 168 hours. No results for input(s): AMMONIA in the last 168 hours. CBC:  Recent Labs Lab 07/16/15 2239 07/17/15 0327  WBC 9.5 7.6  NEUTROABS 8.0*  --   HGB 13.6 12.7*  HCT 39.0 37.6*  MCV 90.5 90.2  PLT 205 189     Cardiac Enzymes: No results for input(s): CKTOTAL, CKMB, CKMBINDEX, TROPONINI in the last 168 hours. BNP (last 3 results) No results for input(s): BNP in the last 8760 hours.  ProBNP (last 3 results) No results for input(s): PROBNP in the last 8760 hours.  CBG: No results for input(s): GLUCAP in the last 168 hours.  Micro Recent Results (from the past 240 hour(s))  Culture, Urine     Status: None (Preliminary result)   Collection Time: 07/16/15  5:03 AM  Result Value Ref Range Status   Specimen Description URINE, RANDOM  Final   Special Requests Normal  Final   Culture CULTURE REINCUBATED FOR BETTER GROWTH  Final   Report Status PENDING  Incomplete     Studies: Ct Maxillofacial Wo Cm  2015-08-14  CLINICAL DATA:  RIGHT-sided mouth pain.  Eight days ago onset. EXAM: CT MAXILLOFACIAL WITHOUT CONTRAST TECHNIQUE: Multidetector CT imaging of the maxillofacial structures was performed. Multiplanar CT image reconstructions were also generated. A small metallic BB was placed on the right temple in order to reliably differentiate right from left. COMPARISON:  07/16/2015 CT head. FINDINGS: Dental amalgam and BILATERAL mandibular tori obscure detail of the oral cavity on the RIGHT. There does appear to be a lucency surrounding the RIGHT second molar roots. The Mandibular cortex is discontinuous posteriorly and medially, and there is a peripherally calcified or peripherally ossified exostosis or walled off abscess projecting medially. See for instance image 34 series 3, and image 37 series 5. Chronic periapical abscess is not excluded. Oral surgery consultation is warranted. No mandibular or maxillary fracture. Chronic RIGHT maxillary sinusitis with layering fluid suggesting acuity superimposed. No ethmoid, frontal, or sphenoid sinus fluid. Vascular calcification. No orbital findings of significance. BILATERAL cataract extraction. Limited views of the intracranial compartment show no definite change  from previous scan. IMPRESSION: Lucency surrounding the RIGHT second molar roots. Medially projecting exostosis or walled off abscess, with cortical discontinuity of the posterior mandible. Chronic periapical abscess is not excluded. Chronic and acute RIGHT maxillary sinusitis. Electronically Signed   By: Staci Righter M.D.   On: 2015-08-14 15:34    Scheduled Meds: . cefTRIAXone (ROCEPHIN)  IV  1 g Intravenous Q24H  . digoxin  0.25 mg Oral q morning - 10a  . dutasteride  0.5 mg Oral QODAY  . feeding supplement (ENSURE ENLIVE)  237 mL Oral TID BM  . fluticasone  2 spray Each Nare Daily  . guaiFENesin  600 mg Oral BID  . polyethylene glycol  17 g Oral QODAY  . pravastatin  20 mg Oral QPM   Continuous Infusions: . sodium chloride 100 mL/hr at 14-Aug-2015 1326       Time spent: 35 minutes    [facility]  [facility] Pager [phone] If 7PM-7AM, please contact night-coverage at [email or web address], password [facility] 07/19/2015, 12:38 PM  LOS: 2 days

## 2015-07-19 NOTE — Clinical Social Work Note (Signed)
Clinical Social Work Assessment  Patient Details  Name: Cody [name] MRN: [id number] Date of Birth: 1925-06-08  Date of referral:  07/19/15               Reason for consult:  Discharge Planning                Permission sought to share information with:  Family Supports Permission granted to share information::  Yes, Verbal Permission Granted  Name::     [name]::     Relationship::  brother  Contact Information:     Housing/Transportation Living arrangements for the past 2 months:  Single Family Home Source of Information:  Patient Patient Interpreter Needed:  None Criminal Activity/Legal Involvement Pertinent to Current Situation/Hospitalization:  No - Comment as needed Significant Relationships:  None Lives with:  Self Do you feel safe going back to the place where you live?  No Need for family participation in patient care:  No (Coment)  Care giving concerns:  PT recommending SNF placement.   Social Worker assessment / plan:  Pt lives home alone but has support from his brother. Pt has never been to SNF but knows he needs extra assistance prior to returning home. CSW provided SNF list and explained PT recommendations. Pt reports he and brother will review list and brother will go and tour facilities. CSW explained process and pt reports understanding.  FL2 and PASRR completed and pt was faxed out. CSW to follow up with bed offers.  Employment status:  Retired Health visitor, Managed Care PT Recommendations:  [place] / Referral to community resources:  [place]  Patient/Family's Response to care:  Pt alert and oriented and engaged during assessment.  Patient/Family's Understanding of and Emotional Response to Diagnosis, Current Treatment, and Prognosis:  Pt reports he is still feeling weak and does not feel ready to DC. Pt is worried about DC from the hospital too soon. Pt feels he has received excellent  care and is anxious that SNF might not be as attentive to his needs. Emotional Assessment Appearance:  Appears stated age Attitude/Demeanor/Rapport:  Other (Cooperative) Affect (typically observed):  Appropriate Orientation:  Oriented to Self, Oriented to Place, Oriented to  Time, Oriented to Situation Alcohol / Substance use:  Not Applicable Psych involvement (Current and /or in the community):  No (Comment)  Discharge Needs  Concerns to be addressed:  No discharge needs identified Readmission within the last 30 days:  No Current discharge risk:  None Barriers to Discharge:  No Barriers Identified   Boone Master, [place] 07/19/2015, 9:50 AM Weekend Coverage

## 2015-07-20 LAB — BASIC METABOLIC PANEL
ANION GAP: 5 (ref 5–15)
BUN: 28 mg/dL — ABNORMAL HIGH (ref 6–20)
CALCIUM: 8.8 mg/dL — AB (ref 8.9–10.3)
CO2: 30 mmol/L (ref 22–32)
CREATININE: 1 mg/dL (ref 0.61–1.24)
Chloride: 105 mmol/L (ref 101–111)
Glucose, Bld: 103 mg/dL — ABNORMAL HIGH (ref 65–99)
Potassium: 4 mmol/L (ref 3.5–5.1)
SODIUM: 140 mmol/L (ref 135–145)

## 2015-07-20 LAB — URINE CULTURE: SPECIAL REQUESTS: NORMAL

## 2015-07-20 NOTE — Progress Notes (Signed)
Gave bed offers to Pt and son at bedside.  Pt and son chose [facility].  Bernita Raisin, [place] Social Work [phone]

## 2015-07-20 NOTE — Progress Notes (Signed)
[facility] PROGRESS NOTE   Cody [name] [id number] DOB: [birth year] DOA: 07/16/2015 PCP: Cody Lopes, MD  HPI/Subjective: Feels better, still has a lot of oral pain. I discussed with him about going to nursing home tomorrow if he has had dental procedure early in the day.  Assessment/Plan: Principal Problem:   Fall Active Problems:   Weakness   Failure to thrive in adult   Balance problem   History of CVA (cerebrovascular accident)   AKI (acute kidney injury) ([place])   Occipital scalp laceration   BPH (benign prostatic hyperplasia)   HLD (hyperlipidemia)   Protein-calorie malnutrition, severe    UTI On admission urinalysis consistent with UTI, started on Rocephin. Continue Rocephin, adjust antibiotics according to culture results. Culture grew oxacillin sensitive delays negative staph  Oral pain Appears to have chipped tooth eroding into the lateral side of the tongue causing extreme discomfort.  CT scan done showed no other findings. He is on Rocephin for UTI, continued, probably this is covers possible infections. Meanwhile start Orajel to control the pain patient is on morphine and hydrocodone. I called Dr. Ritta Slot office on Friday, no one is on call for Dental medicine for the weekend. Explained that to the patient and his brother.  Acute kidney injury Patient presented with acute kidney injury, creatinine baseline from 2014 and 0.95, presented with creatinine of 1.47. Has poor oral intake secondary to mouth pain, continue IV fluids Renal function improved, creatinine 1.0.  Fall Status post fall, with laceration in the back of his head status post stapling in the ED. Does have balance problems, did not lose his consciousness. No further workup in terms of syncope.  Hematuria Patient has gross hematuria, patient on Plavix and subcutaneous heparin added in the hospital. Hold both, no recent instrumentation or catheterization.  This is  resolved.  Deconditioning Patient has failure to thrive and deconditioning, seen by PT and recommended SNF. CSW to evaluate for SNF placement  History of CVA Patient has CVA about 10 years ago, has balance issues since then and is on Plavix for that. Hold on Plavix because of concurrent hematuria.   Code Status: Full Code Family Communication: Plan discussed with the patient. Disposition Plan: Remains inpatient Diet: DIET SOFT Room service appropriate?: Yes; Fluid consistency:: Thin  Consultants:  None  Procedures:  None  Antibiotics:  Rocephin   Objective: Filed Vitals:   07/19/15 2122 07/20/15 0527  BP: 111/61 139/61  Pulse: 75 69  Temp: 98.7 F (37.1 C) 97.6 F (36.4 C)  Resp: 18 18    Intake/Output Summary (Last 24 hours) at 07/20/15 1246 Last data filed at 07/20/15 0949  Gross per 24 hour  Intake    360 ml  Output    300 ml  Net     60 ml   Filed Weights   07/17/15 1315  Weight: 56.7 kg (125 lb)    Exam: General: Alert and awake, oriented x3, not in any acute distress. HEENT: anicteric sclera, pupils reactive to light and accommodation, EOMI CVS: S1-S2 clear, no murmur rubs or gallops Chest: clear to auscultation bilaterally, no wheezing, rales or rhonchi Abdomen: soft nontender, nondistended, normal bowel sounds, no organomegaly Extremities: no cyanosis, clubbing or edema noted bilaterally Neuro: Cranial nerves II-XII intact, no focal neurological deficits  Data Reviewed: Basic Metabolic Panel:  Recent Labs Lab 07/16/15 2239 07/17/15 0327 07/18/15 0418 07/20/15 0835  NA 137 137 139 140  K 3.8 3.7 3.6 4.0  CL 99* 98* 103 105  CO2  24 27 27 30   GLUCOSE 123* 128* 75 103*  BUN 38* 36* 41* 28*  CREATININE 1.47* 1.47* 1.66* 1.00  CALCIUM 9.8 9.4 8.6* 8.8*   Liver Function Tests: No results for input(s): AST, ALT, ALKPHOS, BILITOT, PROT, ALBUMIN in the last 168 hours. No results for input(s): LIPASE, AMYLASE in the last 168 hours. No  results for input(s): AMMONIA in the last 168 hours. CBC:  Recent Labs Lab 07/16/15 2239 07/17/15 0327  WBC 9.5 7.6  NEUTROABS 8.0*  --   HGB 13.6 12.7*  HCT 39.0 37.6*  MCV 90.5 90.2  PLT 205 189   Cardiac Enzymes: No results for input(s): CKTOTAL, CKMB, CKMBINDEX, TROPONINI in the last 168 hours. BNP (last 3 results) No results for input(s): BNP in the last 8760 hours.  ProBNP (last 3 results) No results for input(s): PROBNP in the last 8760 hours.  CBG: No results for input(s): GLUCAP in the last 168 hours.  Micro Recent Results (from the past 240 hour(s))  Culture, Urine     Status: None   Collection Time: 07/16/15  5:03 AM  Result Value Ref Range Status   Specimen Description URINE, RANDOM  Final   Special Requests Normal  Final   Culture   Final    >=100,000 COLONIES/mL STAPHYLOCOCCUS SPECIES (COAGULASE NEGATIVE)   Report Status 07/20/2015 FINAL  Final   Organism ID, Bacteria STAPHYLOCOCCUS SPECIES (COAGULASE NEGATIVE)  Final      Susceptibility   Staphylococcus species (coagulase negative) - MIC*    CIPROFLOXACIN <=0.5 SENSITIVE Sensitive     GENTAMICIN <=0.5 SENSITIVE Sensitive     NITROFURANTOIN <=16 SENSITIVE Sensitive     OXACILLIN <=0.25 SENSITIVE Sensitive     TETRACYCLINE 8 INTERMEDIATE Intermediate     VANCOMYCIN 2 SENSITIVE Sensitive     TRIMETH/SULFA <=10 SENSITIVE Sensitive     CLINDAMYCIN <=0.25 SENSITIVE Sensitive     RIFAMPIN <=0.5 SENSITIVE Sensitive     Inducible Clindamycin NEGATIVE Sensitive     * >=100,000 COLONIES/mL STAPHYLOCOCCUS SPECIES (COAGULASE NEGATIVE)     Studies: Ct Maxillofacial Wo Cm  08-16-15  CLINICAL DATA:  RIGHT-sided mouth pain.  Eight days ago onset. EXAM: CT MAXILLOFACIAL WITHOUT CONTRAST TECHNIQUE: Multidetector CT imaging of the maxillofacial structures was performed. Multiplanar CT image reconstructions were also generated. A small metallic BB was placed on the right temple in order to reliably differentiate right  from left. COMPARISON:  07/16/2015 CT head. FINDINGS: Dental amalgam and BILATERAL mandibular tori obscure detail of the oral cavity on the RIGHT. There does appear to be a lucency surrounding the RIGHT second molar roots. The Mandibular cortex is discontinuous posteriorly and medially, and there is a peripherally calcified or peripherally ossified exostosis or walled off abscess projecting medially. See for instance image 34 series 3, and image 37 series 5. Chronic periapical abscess is not excluded. Oral surgery consultation is warranted. No mandibular or maxillary fracture. Chronic RIGHT maxillary sinusitis with layering fluid suggesting acuity superimposed. No ethmoid, frontal, or sphenoid sinus fluid. Vascular calcification. No orbital findings of significance. BILATERAL cataract extraction. Limited views of the intracranial compartment show no definite change from previous scan. IMPRESSION: Lucency surrounding the RIGHT second molar roots. Medially projecting exostosis or walled off abscess, with cortical discontinuity of the posterior mandible. Chronic periapical abscess is not excluded. Chronic and acute RIGHT maxillary sinusitis. Electronically Signed   By: Staci Righter M.D.   On: 2015-08-16 15:34    Scheduled Meds: . cefTRIAXone (ROCEPHIN)  IV  1 g Intravenous  Q24H  . digoxin  0.25 mg Oral q morning - 10a  . dutasteride  0.5 mg Oral QODAY  . feeding supplement (ENSURE ENLIVE)  237 mL Oral TID BM  . fluticasone  2 spray Each Nare Daily  . guaiFENesin  600 mg Oral BID  . polyethylene glycol  17 g Oral QODAY  . pravastatin  20 mg Oral QPM   Continuous Infusions: . sodium chloride 75 mL/hr at 07/19/15 1244       Time spent: 35 minutes    [facility]  [facility] Pager [phone] If 7PM-7AM, please contact night-coverage at [email or web address], password [facility] 07/20/2015, 12:46 PM  LOS: 3 days

## 2015-07-21 ENCOUNTER — Encounter (HOSPITAL_COMMUNITY): Payer: Self-pay | Admitting: Dentistry

## 2015-07-21 ENCOUNTER — Inpatient Hospital Stay (HOSPITAL_COMMUNITY): Payer: Medicare Other

## 2015-07-21 DIAGNOSIS — R29818 Other symptoms and signs involving the nervous system: Secondary | ICD-10-CM | POA: Diagnosis not present

## 2015-07-21 DIAGNOSIS — I10 Essential (primary) hypertension: Secondary | ICD-10-CM | POA: Diagnosis not present

## 2015-07-21 DIAGNOSIS — I482 Chronic atrial fibrillation: Secondary | ICD-10-CM | POA: Diagnosis not present

## 2015-07-21 DIAGNOSIS — R6884 Jaw pain: Secondary | ICD-10-CM | POA: Diagnosis not present

## 2015-07-21 DIAGNOSIS — R5381 Other malaise: Secondary | ICD-10-CM | POA: Diagnosis not present

## 2015-07-21 DIAGNOSIS — S0100XS Unspecified open wound of scalp, sequela: Secondary | ICD-10-CM | POA: Diagnosis not present

## 2015-07-21 DIAGNOSIS — R1311 Dysphagia, oral phase: Secondary | ICD-10-CM | POA: Diagnosis not present

## 2015-07-21 DIAGNOSIS — N179 Acute kidney failure, unspecified: Secondary | ICD-10-CM | POA: Diagnosis not present

## 2015-07-21 DIAGNOSIS — R278 Other lack of coordination: Secondary | ICD-10-CM | POA: Diagnosis not present

## 2015-07-21 DIAGNOSIS — K5901 Slow transit constipation: Secondary | ICD-10-CM | POA: Diagnosis not present

## 2015-07-21 DIAGNOSIS — M6281 Muscle weakness (generalized): Secondary | ICD-10-CM | POA: Diagnosis not present

## 2015-07-21 DIAGNOSIS — M47892 Other spondylosis, cervical region: Secondary | ICD-10-CM | POA: Diagnosis not present

## 2015-07-21 DIAGNOSIS — Z8673 Personal history of transient ischemic attack (TIA), and cerebral infarction without residual deficits: Secondary | ICD-10-CM | POA: Diagnosis not present

## 2015-07-21 DIAGNOSIS — I499 Cardiac arrhythmia, unspecified: Secondary | ICD-10-CM | POA: Diagnosis not present

## 2015-07-21 DIAGNOSIS — R600 Localized edema: Secondary | ICD-10-CM | POA: Diagnosis not present

## 2015-07-21 DIAGNOSIS — R310 Gross hematuria: Secondary | ICD-10-CM | POA: Diagnosis not present

## 2015-07-21 DIAGNOSIS — R269 Unspecified abnormalities of gait and mobility: Secondary | ICD-10-CM | POA: Diagnosis not present

## 2015-07-21 DIAGNOSIS — E785 Hyperlipidemia, unspecified: Secondary | ICD-10-CM | POA: Diagnosis not present

## 2015-07-21 DIAGNOSIS — E46 Unspecified protein-calorie malnutrition: Secondary | ICD-10-CM | POA: Diagnosis not present

## 2015-07-21 DIAGNOSIS — R2681 Unsteadiness on feet: Secondary | ICD-10-CM | POA: Diagnosis not present

## 2015-07-21 DIAGNOSIS — N400 Enlarged prostate without lower urinary tract symptoms: Secondary | ICD-10-CM | POA: Diagnosis not present

## 2015-07-21 DIAGNOSIS — W19XXXA Unspecified fall, initial encounter: Secondary | ICD-10-CM | POA: Diagnosis not present

## 2015-07-21 DIAGNOSIS — Z9181 History of falling: Secondary | ICD-10-CM | POA: Diagnosis not present

## 2015-07-21 DIAGNOSIS — R531 Weakness: Secondary | ICD-10-CM | POA: Diagnosis not present

## 2015-07-21 DIAGNOSIS — R319 Hematuria, unspecified: Secondary | ICD-10-CM | POA: Diagnosis not present

## 2015-07-21 DIAGNOSIS — I480 Paroxysmal atrial fibrillation: Secondary | ICD-10-CM | POA: Diagnosis not present

## 2015-07-21 DIAGNOSIS — E860 Dehydration: Secondary | ICD-10-CM | POA: Diagnosis not present

## 2015-07-21 DIAGNOSIS — N390 Urinary tract infection, site not specified: Secondary | ICD-10-CM | POA: Diagnosis not present

## 2015-07-21 DIAGNOSIS — K1379 Other lesions of oral mucosa: Secondary | ICD-10-CM | POA: Diagnosis not present

## 2015-07-21 DIAGNOSIS — E43 Unspecified severe protein-calorie malnutrition: Secondary | ICD-10-CM | POA: Diagnosis not present

## 2015-07-21 DIAGNOSIS — R195 Other fecal abnormalities: Secondary | ICD-10-CM | POA: Diagnosis not present

## 2015-07-21 LAB — BASIC METABOLIC PANEL
Anion gap: 5 (ref 5–15)
BUN: 20 mg/dL (ref 6–20)
CO2: 31 mmol/L (ref 22–32)
CREATININE: 0.91 mg/dL (ref 0.61–1.24)
Calcium: 8.7 mg/dL — ABNORMAL LOW (ref 8.9–10.3)
Chloride: 103 mmol/L (ref 101–111)
Glucose, Bld: 98 mg/dL (ref 65–99)
POTASSIUM: 3.9 mmol/L (ref 3.5–5.1)
SODIUM: 139 mmol/L (ref 135–145)

## 2015-07-21 MED ORDER — CHLORHEXIDINE GLUCONATE 0.12 % MT SOLN: 15.0000 mL | Freq: Four times a day (QID) | OROMUCOSAL | Status: DC

## 2015-07-21 MED ORDER — AMOXICILLIN-POT CLAVULANATE 875-125 MG PO TABS: 1.0000 | ORAL_TABLET | Freq: Two times a day (BID) | ORAL | Status: DC

## 2015-07-21 MED ORDER — BENZOCAINE 10 % MT GEL: OROMUCOSAL | Status: DC | PRN

## 2015-07-21 NOTE — Care Management Important Message (Signed)
Important Message  Patient Details  Name: Cody [name] MRN: [id number] Date of Birth: 05-22-1925   Medicare Important Message Given:  Yes    Nathen May 07/21/2015, 3:38 PM

## 2015-07-21 NOTE — Consult Note (Signed)
DENTAL CONSULTATION  Date of Consultation:  07/21/2015 Patient Name:   Cody [name] Date of Birth:   May 24, 1925 Medical Record Number: [id number]  VITALS: BP 133/69 mmHg  Pulse 66  Temp(Src) 98.2 F (36.8 C) (Oral)  Resp 18  Wt 125 lb (56.7 kg)  SpO2 98%  CHIEF COMPLAINT: Patient was referred by Dr. Hartford Poli for a dental consultation.  HPI: Cody [name] is an 82 -year-old male recently admitted with a scalp laceration secondary to a fall. Patient also complained of mouth pain, tongue ulceration, and questionable chipped tooth involving the lower right quadrant. Dental consultation requested to evaluate patient and provide treatment as indicated.  The patient currently is complaining of pain involving the lower right posterior quadrant in the area of #32 on the lingual aspect. Patient is pointing to an area of exposed bone in the area of #32 on the lingual aspect where lingual exostoses are present. The exposed bone measures approximately 5 mm x 6 mm and is relatively loose and appears to be a bony sequestrum.  The patient describes the pain as being dull at times and sharp at other times. This pain has been occurring over the past 2-3 weeks. The pain is exacerbated by chewing in this area. This dental pain is not related to the patient's recent fall. Patient indicates that the sharp area of bone has caused a sore spot on his right tongue that is painful as well. Patient has used Orajel with minimal help. The patient currently denies acute toothaches and indicates that tooth #31 is not hurting him at this time. The patient denies having any teeth that have significant tooth mobility.  The patient last saw his primary dentist, Dr. Earley Favor, in November 2016 for an exam and cleaning. Prior to that, the patient had several mandibular anterior teeth removed without complication.  PROBLEM LIST: Patient Active Problem List   Diagnosis Date Noted  . Weakness 07/17/2015  . Fall 07/17/2015  .  Failure to thrive in adult 07/17/2015  . Balance problem 07/17/2015  . History of CVA (cerebrovascular accident) 07/17/2015  . AKI (acute kidney injury) ([place]) 07/17/2015  . Occipital scalp laceration 07/17/2015  . BPH (benign prostatic hyperplasia) 07/17/2015  . HLD (hyperlipidemia) 07/17/2015  . Protein-calorie malnutrition, severe 07/17/2015  . Inguinal hernia without mention of obstruction or gangrene, unilateral or unspecified, (not specified as recurrent)-right s/p repair with mesh 03/15/13. 01/30/2013  . CVA 10/14/2009  . PERSONAL HX COLONIC POLYPS 10/14/2009    PMH: Past Medical History  Diagnosis Date  . BPH (benign prostatic hyperplasia)   . Hypertension   . Diverticulosis   . Melanoma ([place])     left forearm  . Left carotid bruit   . Osteopenia   . Hyperlipidemia   . Basal cell carcinoma     left forearm  . Inguinal hernia   . Stroke ([place]) 3/07    affected balance  . GERD (gastroesophageal reflux disease)     occurs rarely  . Anginal pain ([place])     nuclear stress 03/12/13 EPIC  . Tubular adenoma of colon 2007  . Diverticulosis   . Paroxysmal atrial fibrillation ([facility])     PSH: Past Surgical History  Procedure Laterality Date  . Colonoscopy  2008  . Left forearm melanoma    . Prostate biopsy    . Eye surgery Right     cataract extraction with IOL  . Inguinal hernia repair Right 03/15/2013    Procedure: HERNIA REPAIR INGUINAL ADULT;  Surgeon: Odis Hollingshead, MD;  Location: [facility];  Service: General;  Laterality: Right;  . Insertion of mesh Right 03/15/2013    Procedure: INSERTION OF MESH;  Surgeon: Odis Hollingshead, MD;  Location: [facility];  Service: General;  Laterality: Right;    ALLERGIES: Allergies  Allergen Reactions  . Aspirin   . Eggs Or Egg-Derived Products Swelling    throat    MEDICATIONS: Current Facility-Administered Medications  Medication Dose Route Frequency Provider Last Rate Last Dose  . 0.9 %  sodium chloride infusion   Intravenous  Continuous Verlee Monte, MD 75 mL/hr at 07/20/15 1248    . acetaminophen (TYLENOL) tablet 650 mg  650 mg Oral Q6H PRN Norval Morton, MD       Or  . acetaminophen (TYLENOL) suppository 650 mg  650 mg Rectal Q6H PRN Rondell A Tamala Julian, MD      . benzocaine (ORAJEL) 10 % mucosal gel   Mouth/Throat PRN Verlee Monte, MD      . bisacodyl (DULCOLAX) EC tablet 5 mg  5 mg Oral Daily PRN Norval Morton, MD   5 mg at 07/20/15 0947  . cefTRIAXone (ROCEPHIN) 1 g in dextrose 5 % 50 mL IVPB  1 g Intravenous Q24H Norval Morton, MD 100 mL/hr at 07/20/15 0745 1 g at 07/20/15 0745  . digoxin (LANOXIN) tablet 0.25 mg  0.25 mg Oral q morning - 10a Rondell A Smith, MD   0.25 mg at 07/21/15 1017  . dutasteride (AVODART) capsule 0.5 mg  0.5 mg Oral QODAY Rondell A Tamala Julian, MD   0.5 mg at 07/21/15 1017  . feeding supplement (ENSURE ENLIVE) (ENSURE ENLIVE) liquid 237 mL  237 mL Oral TID BM Dale Sanger, RD   237 mL at 07/21/15 1016  . fluticasone (FLONASE) 50 MCG/ACT nasal spray 2 spray  2 spray Each Nare Daily Delfina Redwood, MD   2 spray at 07/20/15 0947  . guaiFENesin (MUCINEX) 12 hr tablet 600 mg  600 mg Oral BID Norval Morton, MD   600 mg at 07/20/15 2124  . HYDROcodone-acetaminophen (NORCO/VICODIN) 5-325 MG per tablet 1 tablet  1 tablet Oral Q6H PRN Norval Morton, MD   1 tablet at 07/20/15 0947  . iohexol (OMNIPAQUE) 300 MG/ML solution 50 mL  50 mL Oral Once PRN Anner Crete, MD      . levalbuterol (XOPENEX) nebulizer solution 0.63 mg  0.63 mg Nebulization Q6H PRN Norval Morton, MD      . morphine 2 MG/ML injection 1 mg  1 mg Intravenous Q3H PRN Norval Morton, MD   1 mg at 07/18/15 1046  . nitroGLYCERIN (NITROSTAT) SL tablet 0.4 mg  0.4 mg Sublingual Q5 min PRN Norval Morton, MD      . ondansetron (ZOFRAN) tablet 4 mg  4 mg Oral Q6H PRN Norval Morton, MD       Or  . ondansetron (ZOFRAN) injection 4 mg  4 mg Intravenous Q6H PRN Norval Morton, MD   4 mg at 07/17/15 0955  . polyethylene glycol  (MIRALAX / GLYCOLAX) packet 17 g  17 g Oral QODAY Rondell A Tamala Julian, MD   17 g at 07/21/15 1017  . pravastatin (PRAVACHOL) tablet 20 mg  20 mg Oral QPM Norval Morton, MD   20 mg at 07/20/15 1820    LABS: Lab Results  Component Value Date   WBC 7.6 07/17/2015   HGB 12.7* 07/17/2015   HCT 37.6* 07/17/2015  MCV 90.2 07/17/2015   PLT 189 07/17/2015      Component Value Date/Time   NA 139 07/21/2015 0511   K 3.9 07/21/2015 0511   CL 103 07/21/2015 0511   CO2 31 07/21/2015 0511   GLUCOSE 98 07/21/2015 0511   BUN 20 07/21/2015 0511   CREATININE 0.91 07/21/2015 0511   CALCIUM 8.7* 07/21/2015 0511   GFRNONAA >60 07/21/2015 0511   GFRAA >60 07/21/2015 0511   Lab Results  Component Value Date   INR 1.02 03/13/2013   No results found for: PTT  SOCIAL HISTORY: Social History   Social History  . Marital Status: Divorced    Spouse Name: N/A  . Number of Children: N/A  . Years of Education: N/A   Occupational History  . Not on file.   Social History Main Topics  . Smoking status: Former Research scientist (life sciences)  . Smokeless tobacco: Never Used  . Alcohol Use: Yes     Comment:  3 ounces wine nightly  . Drug Use: No  . Sexual Activity: Not on file   Other Topics Concern  . Not on file   Social History Narrative    FAMILY HISTORY: Family History  Problem Relation Age of Onset  . Cancer Mother     lung and breast  . Heart disease Father     MI  . Cancer Sister     pancreatic    REVIEW OF SYSTEMS: As per History of present illness. Heme: Patient denies any active bleeding. Patient is currently off of his Plavix therapy. CV: Patient denies any active chest pain or heart palpitations. Neuro: Patient denies having any headaches, dizziness at this time. Eyes: Patient denies any visual changes at this time. Respiratory: Patient denies any shortness of breath or difficulty breathing.  DENTAL HISTORY: CHIEF COMPLAINT: Patient was referred by Dr. Hartford Poli for a dental  consultation.  HPI: CHAS AXEL is an 57 -year-old male recently admitted with a scalp laceration secondary to a fall. Patient also complained of mouth pain, tongue ulceration, and questionable chipped tooth involving the lower right quadrant. Dental consultation requested to evaluate patient and provide treatment as indicated.  The patient currently is complaining of pain involving the lower right posterior quadrant in the area of #32 on the lingual aspect. Patient is pointing to an area of exposed bone in the area of #32 on the lingual aspect where lingual exostoses are present. The exposed bone measures approximately 5 mm x 6 mm and is relatively loose and appears to be a bony sequestrum.  The patient describes the pain as being dull at times and sharp at other times. This pain has been occurring over the past 2-3 weeks. The pain is exacerbated by chewing in this area. This dental pain is not related to the patient's recent fall. Patient indicates that the sharp area of bone has caused a sore spot on his right tongue that is painful as well. Patient has used Orajel with minimal help. The patient currently denies acute toothaches and indicates that tooth #31 is not hurting him at this time. The patient denies having any teeth that have significant tooth mobility.  The patient last saw his primary dentist, Dr. Earley Favor, in November 2016 for an exam and cleaning. Prior to that, the patient had several mandibular anterior teeth removed without complication.  DENTAL EXAMINATION: GENERAL: The patient is well-developed, well-nourished male in no acute distress. HEAD AND NECK: There is palpable right submandibular lymphadenopathy. Patient denies acute TMJ symptoms.  The patient has normal opening. INTRAORAL EXAM: The patient has normal saliva. Patient has multiple bilateral mandibular lingual tori and lateral exostoses. There is a 5 x 6 mm area of exposed bone in the area of a right lingual lateral  exostosis area #32.  There is a bony sequestrum in this area that is freely movable.  There is a right lateral tongue ulceration in the area of the exposed bone consistent with trauma from the exposed bone. DENTITION: Patient is missing tooth numbers 1, 16, 17, 4, 25, and 32. Tooth #31 does NOT appear to be fractured. PERIODONTAL: Patient has chronic periodontitis with plaque accumulations, gingival recession, and incipient tooth mobility. Tooth #31 does not have significant tooth mobility today. DENTAL CARIES/SUBOPTIMAL RESTORATIONS: There are no gross dental caries noted. I would need a full series of dental radiographs to rule out dental caries and periapical pathology. ENDODONTIC: Patient currently denies acute pulpitis symptoms. There is no evidence of periapical pathology by my review of the orthopantogram.  I would need a periapical radiograph in the area of #31 to ideally rule out incipient periapical pathology. CROWN AND BRIDGE: Patient has multiple crown or bridge restorations. PROSTHODONTIC: Patient does not have partial dentures. OCCLUSION: Patient has a poor occlusal scheme but a stable occlusion.  RADIOGRAPHIC INTERPRETATION: An orthopantogram was taken today and 07/21/2015. There are multiple missing teeth. There is incipient to moderate bone loss. I do not see any evidence of periapical pathology at this time. There is bone loss on the distal of tooth #31 with probable buccal furcation involvement. There are multiple dental restorations noted. There multiple crown or bridge restorations noted.  I would need a full series of dental radiographs to rule out periapical pathology or dental caries.  ASSESSMENTS: 1. Exposed bone of the mandibular right lingual exostoses with a loose bony sequestrum. (61m x 6 mm) 2. Traumatic ulceration to the right lateral tongue in the area of the exposed bone 3. Chronic periodontitis with bone loss 4. Multiple missing teeth 5. Multiple, multilobular  mandibular lingual tori and exostoses 6. Accretions 7. Gingival recession 8. Tooth mobility 9. Poor occlusal scheme but stable occlusion   PLAN/RECOMMENDATIONS: 1. I discussed the risks, benefits, and complications of various treatment options with the patient in relationship to his medical and dental conditions. We discussed various treatment options to include no treatment, extraction of tooth #31 with alveoloplasty, removal of the bony sequestrum on the lingual aspect of #32 area either at bedside or in the operating room.  Patient currently agrees to proceed with removal of the bony sequestrum area #32 at bedside. The patient will then follow-up with further evaluation of healing and other dental care as indicated with his primary dentist, Dr. HEarley Favor   2. Discussion of findings with medical team and coordination of future medical and dental care as needed. The patient will need to utilize chlorhexidine gluconate rinses twice daily in a swish and spit manner. Antibiotic therapy may be prescribed by the medical team as indicated. Patient is to follow-up with Dr. HEarley Favorfor evaluation of healing in one to 2 weeks. Alternatively, the patient may contact dental medicine for evaluation of healing in that same time period. Patient will be transferred to skilled nursing facility later this afternoon per report of Dr.Elmahi.   PROCEDURE: A suture removal kit was obtained from nursing staff.  The soft tissue pickup was utilized to remove a 4 x 5 mm  piece of bone on the lingual aspect area #32 that was  traumatizing the left lateral tongue. This was removed with no complications. There was minimal heme. Patient tolerated the procedure well and had good resolution of the previous irritation and discomfort after the removal of the bone.     Lenn Cal, DDS

## 2015-07-21 NOTE — Progress Notes (Signed)
Pt ready for d/c to SNF per MD. Report was called to Vivien Rota at [facility], all questions answered. Belongings were sent with pt's brother, Rush Landmark; peripheral IV removed.   Cody [name], Jerry Caras

## 2015-07-21 NOTE — Clinical Social Work Note (Signed)
CSW spoke with [facility] who is willing and able to accept patient today.  [facility] a semi-private room available, but [facility] has private rooms.  When Liaison meets with patient to review admission's paperwork, liaison will allow patient to decide which type of room he wishes and inform CSW if there is a change in SNF.  Disposition: SNF- either [facility] (semi-private room) or [facility] (private room) liaison to review with patient and inform CSW  Nonnie Done, LCSW [phone]  [id number]; 2S 15-16 and [place] Licensed Clinical Social Worker

## 2015-07-21 NOTE — Discharge Summary (Addendum)
Physician Discharge Summary  Cody [name] [id number] DOB: 1925/03/24 DOA: 07/16/2015  PCP: Donnajean Lopes, MD  Admit date: 07/16/2015 Discharge date: 07/21/2015  Time spent: 40 minutes  Recommendations for Outpatient Follow-up:  1. Follow up with nursing home M.D. 2. Restart Plavix and 7 days.   Discharge Diagnoses:  Principal Problem:   Fall Active Problems:   Weakness   Failure to thrive in adult   Balance problem   History of CVA (cerebrovascular accident)   AKI (acute kidney injury) ([place])   Occipital scalp laceration   BPH (benign prostatic hyperplasia)   HLD (hyperlipidemia)   Protein-calorie malnutrition, severe   Discharge Condition: Stable  Diet recommendation: Heart healthy  Filed Weights   07/17/15 1315  Weight: 56.7 kg (125 lb)    History of present illness:  Mr. Cody [name] is a 82 year old male with a past medical history of HTN, BPH, left carotid bruit, previous CVA with residual balance issues; who presents after having a fall this evening. Patient notes while getting out of the car this evening he fell backwards hitting his head on the ground. He suffered a laceration, but denies any loss of consciousness. He does complain of some lower back pain. Further investigation patient notes that he's been dealing with sinus congestion and posterior drainage since Christmas time. The posterior drainage is causing him to be nauseous stomach. Also describing significant tongue pain which has caused him not to eat as well as he usually does over the last few weeks. He saw his primary care regarding this issue and was given lidocaine to swish and some other medicine with some mild relief. Normally he was round 145 pounds but since he's having the tongue pain and discomfort he's dropped down to 123 pounds. He states he is just too weak to take care of himself right now, as he normally lives alone and walks with the assistance of a cane.   Hospital Course:   UTI On  admission urinalysis consistent with UTI, started on Rocephin. Continue Rocephin, adjust antibiotics according to culture results. Culture grew oxacillin sensitive  coagulase negative staph, on discharge placed on Augmentin for his mouth.   Oral pain/infection Appears to have chipped tooth eroding into the lateral side of the tongue causing extreme discomfort.  CT scan done showed no other findings. Was on Rocephin for UTI, continued, probably this is covers possible infections. Meanwhile start Orajel to control the pain.  did not appreciate Dr. Ritta Slot help.  Acute kidney injury Patient presented with acute kidney injury, creatinine baseline from 2014 and 0.95, presented with creatinine of 1.47. Has poor oral intake secondary to mouth pain, continue IV fluids. This is resolved creatinine 0.9 on day of discharge.  Fall Status post fall, with laceration in the back of his head status post stapling in the ED. Does have balance problems, did not lose his consciousness. No further workup in terms of syncope.  Hematuria Patient has gross hematuria, patient on Plavix and subcutaneous heparin added in the hospital. Hold both, no recent instrumentation or catheterization. This is resolved.  Deconditioning Patient has failure to thrive and deconditioning, seen by PT and recommended SNF. CSW to evaluate for SNF placement  History of CVA Patient has CVA about 10 years ago, has balance issues since then and is on Plavix for that. Hold on Plavix because of concurrent hematuria and restart in 7 days after discharge.    Procedures:  None  Consultations:  None  Discharge Exam: Filed Vitals:   07/20/15 2119  07/21/15 0435  BP: 126/65 133/69  Pulse: 68 66  Temp: 98.2 F (36.8 C) 98.2 F (36.8 C)  Resp: 18 18   General: Alert and awake, oriented x3, not in any acute distress. HEENT: anicteric sclera, pupils reactive to light and accommodation, EOMI CVS: S1-S2 clear, no murmur  rubs or gallops Chest: clear to auscultation bilaterally, no wheezing, rales or rhonchi Abdomen: soft nontender, nondistended, normal bowel sounds, no organomegaly Extremities: no cyanosis, clubbing or edema noted bilaterally Neuro: Cranial nerves II-XII intact, no focal neurological deficits  Discharge Instructions   Discharge Instructions    Diet - low sodium heart healthy    Complete by:  As directed      Increase activity slowly    Complete by:  As directed           Current Discharge Medication List    START taking these medications   Details  amoxicillin-clavulanate (AUGMENTIN) 875-125 MG tablet Take 1 tablet by mouth 2 (two) times daily. Qty: 10 tablet, Refills: 0    benzocaine (ORAJEL) 10 % mucosal gel Use as directed in the mouth or throat as needed for mouth pain. Qty: 5.3 g, Refills: 0    chlorhexidine (PERIOGARD) 0.12 % solution Use as directed 15 mLs in the mouth or throat 4 (four) times daily. Qty: 120 mL, Refills: 0      CONTINUE these medications which have NOT CHANGED   Details  digoxin (LANOXIN) 0.25 MG tablet Take 0.25 mg by mouth every morning.     dutasteride (AVODART) 0.5 MG capsule Take 0.5 mg by mouth every other day.     hydrochlorothiazide (HYDRODIURIL) 25 MG tablet Take 12.5 mg by mouth every evening.     polyethylene glycol (MIRALAX / GLYCOLAX) packet Take 17 g by mouth every other day.    pravastatin (PRAVACHOL) 20 MG tablet Take 20 mg by mouth every evening.     NITROSTAT 0.4 MG SL tablet Place 0.4 mg under the tongue every 5 (five) minutes as needed for chest pain.       STOP taking these medications     clopidogrel (PLAVIX) 75 MG tablet      triamterene-hydrochlorothiazide (MAXZIDE-25) 37.5-25 MG per tablet      Na Sulfate-K Sulfate-Mg Sulf SOLN        Allergies  Allergen Reactions  . Aspirin   . Eggs Or Egg-Derived Products Swelling    throat   Follow-up Information    Follow up with [facility] .   Specialty:   [place] information:   [address] [place] [phone]       The results of significant diagnostics from this hospitalization (including imaging, microbiology, ancillary and laboratory) are listed below for reference.    Significant Diagnostic Studies: Dg Orthopantogram  07/21/2015  CLINICAL DATA:  Right lower jaw pain.  Poor dentition. EXAM: ORTHOPANTOGRAM/PANORAMIC COMPARISON:  None. FINDINGS: No evidence of mandible fracture or other focal bone lesions. Multiple dental restorations are seen. Mild periapical lucency is seen involving the right mandibular second molar. IMPRESSION: Mild periapical lucency involving the right mandibular second molar. Dedicated dental radiographs suggests for further evaluation. Electronically Signed   By: Earle Gell M.D.   On: 07/21/2015 09:09   Ct Head Wo Contrast  07/16/2015  CLINICAL DATA:  82 year old male with fall EXAM: CT HEAD WITHOUT CONTRAST CT CERVICAL SPINE WITHOUT CONTRAST TECHNIQUE: Multidetector CT imaging of the head and cervical spine was performed following the  standard protocol without intravenous contrast. Multiplanar CT image reconstructions of the cervical spine were also generated. COMPARISON:  CT dated 09/20/2005 and MRI dated 10/22/2005 FINDINGS: CT HEAD FINDINGS There is slight prominence of the ventricles and sulci compatible with age-related volume loss. Mild periventricular and deep white matter hypodensities represent chronic microvascular ischemic changes. Focal right posterior parietal parenchymal hypodensity compatible with an old infarct. There is no intracranial hemorrhage. No mass effect or midline shift identified. Mild mucoperiosteal thickening of paranasal sinuses. No air-fluid level. The mastoid air cells are well aerated. There is posterior scalp laceration and hematoma. The calvarium is intact. CT CERVICAL SPINE FINDINGS There is no acute fracture or subluxation  of the cervical spine.There is osteopenia with multilevel degenerative changes most prominent at C5-C6, and C6-C7 where there is endplate irregularity and disc space narrowing.The odontoid and spinous processes are intact.There is normal anatomic alignment of the C1-C2 lateral masses. The visualized soft tissues appear unremarkable. IMPRESSION: No acute intracranial hemorrhage. Age-related atrophy and chronic microvascular ischemic changes. No acute/traumatic cervical spine pathology. Electronically Signed   By: Anner Crete M.D.   On: 07/16/2015 21:16   Ct Cervical Spine Wo Contrast  07/16/2015  CLINICAL DATA:  82 year old male with fall EXAM: CT HEAD WITHOUT CONTRAST CT CERVICAL SPINE WITHOUT CONTRAST TECHNIQUE: Multidetector CT imaging of the head and cervical spine was performed following the standard protocol without intravenous contrast. Multiplanar CT image reconstructions of the cervical spine were also generated. COMPARISON:  CT dated 09/20/2005 and MRI dated 10/22/2005 FINDINGS: CT HEAD FINDINGS There is slight prominence of the ventricles and sulci compatible with age-related volume loss. Mild periventricular and deep white matter hypodensities represent chronic microvascular ischemic changes. Focal right posterior parietal parenchymal hypodensity compatible with an old infarct. There is no intracranial hemorrhage. No mass effect or midline shift identified. Mild mucoperiosteal thickening of paranasal sinuses. No air-fluid level. The mastoid air cells are well aerated. There is posterior scalp laceration and hematoma. The calvarium is intact. CT CERVICAL SPINE FINDINGS There is no acute fracture or subluxation of the cervical spine.There is osteopenia with multilevel degenerative changes most prominent at C5-C6, and C6-C7 where there is endplate irregularity and disc space narrowing.The odontoid and spinous processes are intact.There is normal anatomic alignment of the C1-C2 lateral masses. The  visualized soft tissues appear unremarkable. IMPRESSION: No acute intracranial hemorrhage. Age-related atrophy and chronic microvascular ischemic changes. No acute/traumatic cervical spine pathology. Electronically Signed   By: Anner Crete M.D.   On: 07/16/2015 21:16   Dg Chest Port 1 View  07/17/2015  CLINICAL DATA:  Acute onset of cough.  Initial encounter. EXAM: PORTABLE CHEST 1 VIEW COMPARISON:  Chest radiograph performed 03/13/2013 FINDINGS: The lungs are hyperexpanded, with flattening of the hemidiaphragms, likely reflecting COPD. There is no evidence of focal opacification, pleural effusion or pneumothorax. The cardiomediastinal silhouette is within normal limits. No acute osseous abnormalities are seen. Clips are noted overlying the left axilla. IMPRESSION: Findings suggest COPD.  Lungs otherwise grossly clear. Electronically Signed   By: Garald Balding M.D.   On: 07/17/2015 02:00   Ct Maxillofacial Wo Cm  07/18/2015  CLINICAL DATA:  RIGHT-sided mouth pain.  Eight days ago onset. EXAM: CT MAXILLOFACIAL WITHOUT CONTRAST TECHNIQUE: Multidetector CT imaging of the maxillofacial structures was performed. Multiplanar CT image reconstructions were also generated. A small metallic BB was placed on the right temple in order to reliably differentiate right from left. COMPARISON:  07/16/2015 CT head. FINDINGS: Dental amalgam and BILATERAL mandibular tori obscure  detail of the oral cavity on the RIGHT. There does appear to be a lucency surrounding the RIGHT second molar roots. The Mandibular cortex is discontinuous posteriorly and medially, and there is a peripherally calcified or peripherally ossified exostosis or walled off abscess projecting medially. See for instance image 34 series 3, and image 37 series 5. Chronic periapical abscess is not excluded. Oral surgery consultation is warranted. No mandibular or maxillary fracture. Chronic RIGHT maxillary sinusitis with layering fluid suggesting acuity  superimposed. No ethmoid, frontal, or sphenoid sinus fluid. Vascular calcification. No orbital findings of significance. BILATERAL cataract extraction. Limited views of the intracranial compartment show no definite change from previous scan. IMPRESSION: Lucency surrounding the RIGHT second molar roots. Medially projecting exostosis or walled off abscess, with cortical discontinuity of the posterior mandible. Chronic periapical abscess is not excluded. Chronic and acute RIGHT maxillary sinusitis. Electronically Signed   By: Staci Righter M.D.   On: 07/18/2015 15:34    Microbiology: Recent Results (from the past 240 hour(s))  Culture, Urine     Status: None   Collection Time: 07/16/15  5:03 AM  Result Value Ref Range Status   Specimen Description URINE, RANDOM  Final   Special Requests Normal  Final   Culture   Final    >=100,000 COLONIES/mL STAPHYLOCOCCUS SPECIES (COAGULASE NEGATIVE)   Report Status 07/20/2015 FINAL  Final   Organism ID, Bacteria STAPHYLOCOCCUS SPECIES (COAGULASE NEGATIVE)  Final      Susceptibility   Staphylococcus species (coagulase negative) - MIC*    CIPROFLOXACIN <=0.5 SENSITIVE Sensitive     GENTAMICIN <=0.5 SENSITIVE Sensitive     NITROFURANTOIN <=16 SENSITIVE Sensitive     OXACILLIN <=0.25 SENSITIVE Sensitive     TETRACYCLINE 8 INTERMEDIATE Intermediate     VANCOMYCIN 2 SENSITIVE Sensitive     TRIMETH/SULFA <=10 SENSITIVE Sensitive     CLINDAMYCIN <=0.25 SENSITIVE Sensitive     RIFAMPIN <=0.5 SENSITIVE Sensitive     Inducible Clindamycin NEGATIVE Sensitive     * >=100,000 COLONIES/mL STAPHYLOCOCCUS SPECIES (COAGULASE NEGATIVE)     Labs: Basic Metabolic Panel:  Recent Labs Lab 07/16/15 2239 07/17/15 0327 07/18/15 0418 07/20/15 0835 07/21/15 0511  NA 137 137 139 140 139  K 3.8 3.7 3.6 4.0 3.9  CL 99* 98* 103 105 103  CO2 24 27 27 30 31   GLUCOSE 123* 128* 75 103* 98  BUN 38* 36* 41* 28* 20  CREATININE 1.47* 1.47* 1.66* 1.00 0.91  CALCIUM 9.8 9.4 8.6*  8.8* 8.7*   Liver Function Tests: No results for input(s): AST, ALT, ALKPHOS, BILITOT, PROT, ALBUMIN in the last 168 hours. No results for input(s): LIPASE, AMYLASE in the last 168 hours. No results for input(s): AMMONIA in the last 168 hours. CBC:  Recent Labs Lab 07/16/15 2239 07/17/15 0327  WBC 9.5 7.6  NEUTROABS 8.0*  --   HGB 13.6 12.7*  HCT 39.0 37.6*  MCV 90.5 90.2  PLT 205 189   Cardiac Enzymes: No results for input(s): CKTOTAL, CKMB, CKMBINDEX, TROPONINI in the last 168 hours. BNP: BNP (last 3 results) No results for input(s): BNP in the last 8760 hours.  ProBNP (last 3 results) No results for input(s): PROBNP in the last 8760 hours.  CBG: No results for input(s): GLUCAP in the last 168 hours.     Signed:  Birdie Hopes MD.  [facility] 07/21/2015, 1:26 PM

## 2015-07-22 ENCOUNTER — Non-Acute Institutional Stay (SKILLED_NURSING_FACILITY): Payer: Medicare Other | Admitting: Internal Medicine

## 2015-07-22 DIAGNOSIS — R5381 Other malaise: Secondary | ICD-10-CM

## 2015-07-22 DIAGNOSIS — R195 Other fecal abnormalities: Secondary | ICD-10-CM | POA: Diagnosis not present

## 2015-07-22 DIAGNOSIS — I499 Cardiac arrhythmia, unspecified: Secondary | ICD-10-CM

## 2015-07-22 DIAGNOSIS — Z8673 Personal history of transient ischemic attack (TIA), and cerebral infarction without residual deficits: Secondary | ICD-10-CM | POA: Diagnosis not present

## 2015-07-22 DIAGNOSIS — N390 Urinary tract infection, site not specified: Secondary | ICD-10-CM | POA: Diagnosis not present

## 2015-07-22 DIAGNOSIS — R319 Hematuria, unspecified: Secondary | ICD-10-CM

## 2015-07-22 DIAGNOSIS — S0100XS Unspecified open wound of scalp, sequela: Secondary | ICD-10-CM

## 2015-07-22 DIAGNOSIS — I10 Essential (primary) hypertension: Secondary | ICD-10-CM

## 2015-07-22 DIAGNOSIS — K1379 Other lesions of oral mucosa: Secondary | ICD-10-CM

## 2015-07-22 DIAGNOSIS — S0101XS Laceration without foreign body of scalp, sequela: Secondary | ICD-10-CM

## 2015-07-22 DIAGNOSIS — E785 Hyperlipidemia, unspecified: Secondary | ICD-10-CM | POA: Diagnosis not present

## 2015-07-22 NOTE — Progress Notes (Signed)
Patient ID: Cody [name], male   DOB: October 17, 1924, 82 y.o.   MRN: [id number]     Facility: [facility]    PCP: Donnajean Lopes, MD  Code Status: DNR  Allergies  Allergen Reactions  . Aspirin   . Eggs Or Egg-Derived Products Swelling    throat    Chief Complaint  Patient presents with  . New Admit To SNF     HPI:  82 y.o. patient is here for short term rehabilitation post hospital admission from 07/16/15-07/21/15 post fall with laceration to back of his head that was cleaned and stapled. He was treated for UTI with iv antibiotics and later switched to augmentin. He was noted to have oral pain and possible infection and was treated for it. He had acute renal failure and responded well to iv fluids. He has PMH of HTN, BPH, CVA among others. He is seen in his room today. He had his first good meal today in several days and feels good about it. He is concerned about therapy finding his heart rate to be low. He denies any chest pain, dyspnea or dizziness. He mentions having afib diagnosed last year but no afib noted in EKG in epic which is from this hospitalization. He mentions being on miralax 17 g qod for sometime and would like it resumed.  Review of Systems:  Constitutional: Negative for fever, chills, diaphoresis.  HENT: Negative for headache, congestion, nasal discharge Eyes: Negative for eye pain, blurred vision, double vision and discharge.  Respiratory: Negative for cough, shortness of breath and wheezing.   Cardiovascular: Negative for chest pain, palpitations, leg swelling.  Gastrointestinal: Negative for heartburn, nausea, vomiting, abdominal pain. Had bowel movement yesterday Genitourinary: Negative for dysuria and flank pain. no further hematuria reported Musculoskeletal: Negative for back pain, falls in the facility Skin: Negative for itching, rash.  Neurological: Negative for dizziness Psychiatric/Behavioral: Negative for depression   Past  Medical History  Diagnosis Date  . BPH (benign prostatic hyperplasia)   . Hypertension   . Diverticulosis   . Melanoma ([place])     left forearm  . Left carotid bruit   . Osteopenia   . Hyperlipidemia   . Basal cell carcinoma     left forearm  . Inguinal hernia   . Stroke ([place]) 3/07    affected balance  . GERD (gastroesophageal reflux disease)     occurs rarely  . Anginal pain ([place])     nuclear stress 03/12/13 EPIC  . Tubular adenoma of colon 2007  . Diverticulosis   . Paroxysmal atrial fibrillation [facility])    Past Surgical History  Procedure Laterality Date  . Colonoscopy  2008  . Left forearm melanoma    . Prostate biopsy    . Eye surgery Right     cataract extraction with IOL  . Inguinal hernia repair Right 03/15/2013    Procedure: HERNIA REPAIR INGUINAL ADULT;  Surgeon: Odis Hollingshead, MD;  Location: [facility];  Service: General;  Laterality: Right;  . Insertion of mesh Right 03/15/2013    Procedure: INSERTION OF MESH;  Surgeon: Odis Hollingshead, MD;  Location: [facility];  Service: General;  Laterality: Right;   Social History:   reports that he has quit smoking. He has never used smokeless tobacco. He reports that he drinks alcohol. He reports that he does not use illicit drugs.  Family History  Problem Relation Age of Onset  . Cancer Mother     lung and breast  .  Heart disease Father     MI  . Cancer Sister     pancreatic    Medications:   Medication List       This list is accurate as of: 07/22/15 12:04 PM.  Always use your most recent med list.               amoxicillin-clavulanate 875-125 MG tablet  Commonly known as:  AUGMENTIN  Take 1 tablet by mouth 2 (two) times daily.     benzocaine 10 % mucosal gel  Commonly known as:  ORAJEL  Use as directed in the mouth or throat as needed for mouth pain.     chlorhexidine 0.12 % solution  Commonly known as:  PERIOGARD  Use as directed 15 mLs in the mouth or throat 4 (four) times daily.     digoxin 0.25 MG  tablet  Commonly known as:  LANOXIN  Take 0.25 mg by mouth every morning.     dutasteride 0.5 MG capsule  Commonly known as:  AVODART  Take 0.5 mg by mouth every other day.     hydrochlorothiazide 25 MG tablet  Commonly known as:  HYDRODIURIL  Take 12.5 mg by mouth every evening.     NITROSTAT 0.4 MG SL tablet  Generic drug:  nitroGLYCERIN  Place 0.4 mg under the tongue every 5 (five) minutes as needed for chest pain.     polyethylene glycol packet  Commonly known as:  MIRALAX / GLYCOLAX  Take 17 g by mouth every other day.     pravastatin 20 MG tablet  Commonly known as:  PRAVACHOL  Take 20 mg by mouth every evening.         Physical Exam: Filed Vitals:   07/22/15 1203  BP: 106/60  Pulse: 67  Temp: 98.4 F (36.9 C)  Resp: 19  SpO2: 97%    General- elderly, frail, thin built male, in no acute distress Head- normocephalic, laceration to his scalp with staples in place Nose- no maxillary or frontal sinus tenderness, no nasal discharge Throat- moist mucus membrane, missing teeth, ulcer to right lateral tongue area Eyes- PERRLA, EOMI, no pallor, no icterus, no discharge, normal conjunctiva, normal sclera Neck- no cervical lymphadenopathy Cardiovascular- irregular heart rate, no murmurs, no leg edema Respiratory- bilateral clear to auscultation, no wheeze, no rhonchi, no crackles, no use of accessory muscles Abdomen- bowel sounds present, soft, non tender Musculoskeletal- able to move all 4 extremities, generalized weakness, muscle wasting noted with temporal area wasting Neurological- no focal deficit, alert and oriented to person, place and time Skin- warm and dry, callus to both feet, right elbow skin tear, easy bruising noted, laceration to the scalp with staples Psychiatry- normal mood and affect    Labs reviewed: Basic Metabolic Panel:  Recent Labs  07/18/15 0418 07/20/15 0835 07/21/15 0511  NA 139 140 139  K 3.6 4.0 3.9  CL 103 105 103  CO2 27 30 31    GLUCOSE 75 103* 98  BUN 41* 28* 20  CREATININE 1.66* 1.00 0.91  CALCIUM 8.6* 8.8* 8.7*   CBC:  Recent Labs  07/16/15 2239 07/17/15 0327  WBC 9.5 7.6  NEUTROABS 8.0*  --   HGB 13.6 12.7*  HCT 39.0 37.6*  MCV 90.5 90.2  PLT 205 189    Radiological Exams: Ct Head Wo Contrast  07/16/2015  CLINICAL DATA:  82 year old male with fall EXAM: CT HEAD WITHOUT CONTRAST CT CERVICAL SPINE WITHOUT CONTRAST TECHNIQUE: Multidetector CT imaging of the head and cervical  spine was performed following the standard protocol without intravenous contrast. Multiplanar CT image reconstructions of the cervical spine were also generated. COMPARISON:  CT dated 09/20/2005 and MRI dated 10/22/2005 FINDINGS: CT HEAD FINDINGS There is slight prominence of the ventricles and sulci compatible with age-related volume loss. Mild periventricular and deep white matter hypodensities represent chronic microvascular ischemic changes. Focal right posterior parietal parenchymal hypodensity compatible with an old infarct. There is no intracranial hemorrhage. No mass effect or midline shift identified. Mild mucoperiosteal thickening of paranasal sinuses. No air-fluid level. The mastoid air cells are well aerated. There is posterior scalp laceration and hematoma. The calvarium is intact. CT CERVICAL SPINE FINDINGS There is no acute fracture or subluxation of the cervical spine.There is osteopenia with multilevel degenerative changes most prominent at C5-C6, and C6-C7 where there is endplate irregularity and disc space narrowing.The odontoid and spinous processes are intact.There is normal anatomic alignment of the C1-C2 lateral masses. The visualized soft tissues appear unremarkable. IMPRESSION: No acute intracranial hemorrhage. Age-related atrophy and chronic microvascular ischemic changes. No acute/traumatic cervical spine pathology. Electronically Signed   By: Anner Crete M.D.   On: 07/16/2015 21:16   Ct Cervical Spine Wo  Contrast  07/16/2015  CLINICAL DATA:  82 year old male with fall EXAM: CT HEAD WITHOUT CONTRAST CT CERVICAL SPINE WITHOUT CONTRAST TECHNIQUE: Multidetector CT imaging of the head and cervical spine was performed following the standard protocol without intravenous contrast. Multiplanar CT image reconstructions of the cervical spine were also generated. COMPARISON:  CT dated 09/20/2005 and MRI dated 10/22/2005 FINDINGS: CT HEAD FINDINGS There is slight prominence of the ventricles and sulci compatible with age-related volume loss. Mild periventricular and deep white matter hypodensities represent chronic microvascular ischemic changes. Focal right posterior parietal parenchymal hypodensity compatible with an old infarct. There is no intracranial hemorrhage. No mass effect or midline shift identified. Mild mucoperiosteal thickening of paranasal sinuses. No air-fluid level. The mastoid air cells are well aerated. There is posterior scalp laceration and hematoma. The calvarium is intact. CT CERVICAL SPINE FINDINGS There is no acute fracture or subluxation of the cervical spine.There is osteopenia with multilevel degenerative changes most prominent at C5-C6, and C6-C7 where there is endplate irregularity and disc space narrowing.The odontoid and spinous processes are intact.There is normal anatomic alignment of the C1-C2 lateral masses. The visualized soft tissues appear unremarkable. IMPRESSION: No acute intracranial hemorrhage. Age-related atrophy and chronic microvascular ischemic changes. No acute/traumatic cervical spine pathology. Electronically Signed   By: Anner Crete M.D.   On: 07/16/2015 21:16   Dg Chest Port 1 View  07/17/2015  CLINICAL DATA:  Acute onset of cough.  Initial encounter. EXAM: PORTABLE CHEST 1 VIEW COMPARISON:  Chest radiograph performed 03/13/2013 FINDINGS: The lungs are hyperexpanded, with flattening of the hemidiaphragms, likely reflecting COPD. There is no evidence of focal  opacification, pleural effusion or pneumothorax. The cardiomediastinal silhouette is within normal limits. No acute osseous abnormalities are seen. Clips are noted overlying the left axilla. IMPRESSION: Findings suggest COPD.  Lungs otherwise grossly clear. Electronically Signed   By: Garald Balding M.D.   On: 07/17/2015 02:00     Assessment/Plan  Physical deconditioning Will have him work with physical therapy and occupational therapy team to help with gait training and muscle strengthening exercises.fall precautions. Skin care. Encourage to be out of bed.   UTI Currently asymptomatic. Continue and complete course of augmentin on 07/27/15. Hydration to be maintained  mouth sore S/p bony sequestrum removal. Continue orajel. Continue oral hygiene. To f/u  with his dentist in 2 weeks.   Hematuria Denies any further hematuria. Check cbc and if stable, will restart plavix 07/24/15 which is currently on hold  CVA Continue statin for now, plavix to be resumed if hb stable. Monitor clinically  Irregular heart rate On exam. Denies palpitation. Get EKG to assess for HR and rhythm. Patient mentions having hx of afib in past but no available EKG to review. Currently on digoxin.  Scalp laceration Has staples, keep area clean and dry and will have wound care follow up with removal of staples  Loose stool Denies abdominal pain. Has resumed eating from this am. Likely antibiotic related. Add florastor 250 mg bid x 10 days and monitor. Maintain hydration.  HTN bp stable but on lower side. Continue HCTZ. Add holding parameter to HCTZ  HLD Continue pravastatin  Goals of care: short term rehabilitation   Labs/tests ordered: cbc, cmp 07/23/15  Family/ staff Communication: reviewed care plan with patient and nursing supervisor    Blanchie Serve, MD  [facility] [phone] (Monday-Friday 8 am - 5 pm) [phone] (afterhours)

## 2015-07-23 DIAGNOSIS — W19XXXA Unspecified fall, initial encounter: Secondary | ICD-10-CM

## 2015-07-23 DIAGNOSIS — K1379 Other lesions of oral mucosa: Secondary | ICD-10-CM

## 2015-07-23 LAB — BASIC METABOLIC PANEL
BUN: 20 mg/dL (ref 4–21)
CREATININE: 0.9 mg/dL (ref 0.6–1.3)
Glucose: 89 mg/dL
Potassium: 4.1 mmol/L (ref 3.4–5.3)
SODIUM: 139 mmol/L (ref 137–147)

## 2015-07-23 LAB — HEPATIC FUNCTION PANEL
ALK PHOS: 59 U/L (ref 25–125)
ALT: 16 U/L (ref 10–40)
AST: 21 U/L (ref 14–40)
Bilirubin, Total: 0.8 mg/dL

## 2015-07-23 LAB — CBC AND DIFFERENTIAL
HCT: 40 % — AB (ref 41–53)
Hemoglobin: 12.8 g/dL — AB (ref 13.5–17.5)
PLATELETS: 165 10*3/uL (ref 150–399)
WBC: 4.9 10^3/mL

## 2015-08-18 ENCOUNTER — Encounter: Payer: Self-pay | Admitting: Family

## 2015-08-18 ENCOUNTER — Non-Acute Institutional Stay (SKILLED_NURSING_FACILITY): Payer: Medicare Other | Admitting: Family

## 2015-08-18 DIAGNOSIS — I482 Chronic atrial fibrillation, unspecified: Secondary | ICD-10-CM

## 2015-08-18 DIAGNOSIS — E785 Hyperlipidemia, unspecified: Secondary | ICD-10-CM | POA: Diagnosis not present

## 2015-08-18 DIAGNOSIS — R600 Localized edema: Secondary | ICD-10-CM | POA: Diagnosis not present

## 2015-08-18 DIAGNOSIS — R609 Edema, unspecified: Secondary | ICD-10-CM | POA: Insufficient documentation

## 2015-08-18 DIAGNOSIS — R269 Unspecified abnormalities of gait and mobility: Secondary | ICD-10-CM

## 2015-08-18 DIAGNOSIS — Z8673 Personal history of transient ischemic attack (TIA), and cerebral infarction without residual deficits: Secondary | ICD-10-CM | POA: Diagnosis not present

## 2015-08-18 DIAGNOSIS — N400 Enlarged prostate without lower urinary tract symptoms: Secondary | ICD-10-CM | POA: Diagnosis not present

## 2015-08-18 DIAGNOSIS — K5901 Slow transit constipation: Secondary | ICD-10-CM | POA: Diagnosis not present

## 2015-08-18 DIAGNOSIS — E43 Unspecified severe protein-calorie malnutrition: Secondary | ICD-10-CM

## 2015-08-18 HISTORY — DX: Edema, unspecified: R60.9

## 2015-08-18 NOTE — Progress Notes (Signed)
This encounter was created in error - please disregard.

## 2015-08-18 NOTE — Progress Notes (Signed)
Patient ID: Cody [name], male   DOB: December 31, 1924, 82 y.o.   MRN: [id number]  Location:  [facility] Room Number: [id number] Place of Service:  SNF [phone])  Provider: Blanchie Serve, MD   PCP: Donnajean Lopes, MD Patient Care Team: Leanna Battles, MD as PCP - General (Internal Medicine)  Extended Emergency Contact Information Primary Emergency Contact: Kimberley,Bill Address: [address]          [place], [place] [address] [place] Phone: [phone] Mobile Phone: [phone] Relation: Brother Secondary Emergency Contact: [place] Mobile Phone: [phone] Relation: Relative  Code Status:DNR  Goals of care:  Advanced Directive information Advanced Directives 08/18/2015  Does patient have an advance directive? Yes  Type of Advance Directive Out of facility DNR (pink MOST or yellow form)  Does patient want to make changes to advanced directive? No - Patient declined  Copy of advanced directive(s) in chart? Yes     Allergies  Allergen Reactions  . Aspirin   . Eggs Or Egg-Derived Products Swelling    throat    Chief Complaint  Patient presents with  . Discharge Note    Discharge from facility    HPI:  82 y.o. male seen at [facility] for discharge to [place]. He is here post hospital admission from 07/16/15-07/21/15 post fall with laceration to back of his head that was cleaned and stapled. He was treated for UTI with iv antibiotics and later switched to augmentin. He was noted to have oral pain and possible infection and was treated for it. He had acute renal failure and responded well to iv fluids. Head staples removed and healing well.He has PMH of HTN, BPH, CVA among others. He is seen in his room today. He has worked with Physical Therapy with much improvement. He is stable to discharge to ALF with PT/OT for ROM, Exercise, Gait Stability and Muscle strengthening.  He will require a standard wheelchair to allow current level of independence with ADL's.      Past Medical History  Diagnosis Date  . BPH (benign prostatic hyperplasia)   . Hypertension   . Diverticulosis   . Melanoma ([place])     left forearm  . Left carotid bruit   . Osteopenia   . Hyperlipidemia   . Basal cell carcinoma     left forearm  . Inguinal hernia   . Stroke ([place]) 3/07    affected balance  . GERD (gastroesophageal reflux disease)     occurs rarely  . Anginal pain ([place])     nuclear stress 03/12/13 EPIC  . Tubular adenoma of colon 2007  . Diverticulosis   . Paroxysmal atrial fibrillation [facility])     Past Surgical History  Procedure Laterality Date  . Colonoscopy  2008  . Left forearm melanoma    . Prostate biopsy    . Eye surgery Right     cataract extraction with IOL  . Inguinal hernia repair Right 03/15/2013    Procedure: HERNIA REPAIR INGUINAL ADULT;  Surgeon: Odis Hollingshead, MD;  Location: [facility];  Service: General;  Laterality: Right;  . Insertion of mesh Right 03/15/2013    Procedure: INSERTION OF MESH;  Surgeon: Odis Hollingshead, MD;  Location: [facility];  Service: General;  Laterality: Right;      reports that he has quit smoking. He has never used smokeless tobacco. He reports that he drinks alcohol. He reports that  he does not use illicit drugs. Social History   Social History  . Marital Status: Divorced    Spouse Name: N/A  . Number of Children: N/A  . Years of Education: N/A   Occupational History  . Not on file.   Social History Main Topics  . Smoking status: Former Research scientist (life sciences)  . Smokeless tobacco: Never Used  . Alcohol Use: Yes     Comment:  3 ounces wine nightly  . Drug Use: No  . Sexual Activity: Not on file   Other Topics Concern  . Not on file   Social History Narrative   Functional Status Survey:    Allergies  Allergen Reactions  . Aspirin   . Eggs Or Egg-Derived Products Swelling    throat    Pertinent  Health Maintenance  Due  Topic Date Due  . INFLUENZA VACCINE  08/25/2016 (Originally 01/27/2015)  . PNA vac Low Risk Adult (1 of 2 - PCV13) 08/25/2016 (Originally 06/19/1992)    Medications:   Medication List       This list is accurate as of: 08/18/15  5:19 PM.  Always use your most recent med list.               benzocaine 10 % mucosal gel  Commonly known as:  ORAJEL  Use as directed in the mouth or throat as needed for mouth pain.     clopidogrel 75 MG tablet  Commonly known as:  PLAVIX  Take 75 mg by mouth daily. Start PLAVIX from 07/24/15 if no further hematuria is reported by patient and HB > 12.     digoxin 0.25 MG tablet  Commonly known as:  LANOXIN  Take 0.25 mg by mouth every morning.     dutasteride 0.5 MG capsule  Commonly known as:  AVODART  Take 0.5 mg by mouth every other day.     hydrochlorothiazide 25 MG tablet  Commonly known as:  HYDRODIURIL  Take 12.5 mg by mouth every evening.     NITROSTAT 0.4 MG SL tablet  Generic drug:  nitroGLYCERIN  Place 0.4 mg under the tongue every 5 (five) minutes as needed for chest pain.     polyethylene glycol packet  Commonly known as:  MIRALAX / GLYCOLAX  Take 17 g by mouth every other day.     pravastatin 20 MG tablet  Commonly known as:  PRAVACHOL  Take 20 mg by mouth every evening.        Review of Systems  Constitutional: Negative for fever, chills, diaphoresis, appetite change and fatigue.  HENT: Negative.   Eyes: Negative.   Respiratory: Negative for cough, shortness of breath and wheezing.   Cardiovascular: Negative for chest pain and palpitations.       Chronic right leg swelling. Recommended Ted hose but states has them at home will wear them at ALF.   Gastrointestinal: Negative.   Endocrine: Negative.   Genitourinary: Negative.   Musculoskeletal: Positive for gait problem. Negative for myalgias, back pain, joint swelling, arthralgias and neck pain.  Skin: Negative.  Negative for pallor and rash.    Allergic/Immunologic: Negative.   Neurological: Negative for dizziness, tremors, seizures, syncope, weakness, light-headedness, numbness and headaches.  Hematological: Negative.   Psychiatric/Behavioral: Negative.     Filed Vitals:   08/18/15 1512  BP: 104/79  Pulse: 67  Temp: 97.6 F (36.4 C)  TempSrc: Oral  Resp: 19  Height: 6\' 2"  (1.88 m)  Weight: 124 lb 12.8 oz (56.609 kg)  SpO2: 98%  Body mass index is 16.02 kg/(m^2). Physical Exam  Constitutional: He is oriented to person, place, and time. He appears well-developed.  Frail Elderly in no acute distress   HENT:  Head: Normocephalic.  Right Ear: External ear normal.  Left Ear: External ear normal.  Mouth/Throat: Oropharynx is clear and moist.  Eyes: Conjunctivae and EOM are normal. Pupils are equal, round, and reactive to light. Right eye exhibits no discharge. No scleral icterus.  Neck: Normal range of motion. No JVD present. No tracheal deviation present. No thyromegaly present.  Cardiovascular: Normal rate, regular rhythm, normal heart sounds and intact distal pulses.  Exam reveals no gallop and no friction rub.   No murmur heard. Pulmonary/Chest: Effort normal and breath sounds normal. No respiratory distress. He has no wheezes. He has no rales.  Abdominal: Soft. Bowel sounds are normal. He exhibits no distension and no mass. There is no tenderness. There is no rebound and no guarding.  Musculoskeletal: Normal range of motion. He exhibits no tenderness.  +2 RLE Refused Ted hose.   Lymphadenopathy:    He has no cervical adenopathy.  Neurological: He is oriented to person, place, and time.  Skin: Skin is warm. No rash noted. No erythema. No pallor.  Psychiatric: He has a normal mood and affect.    Labs reviewed: Basic Metabolic Panel:  Recent Labs  07/18/15 0418 07/20/15 0835 07/21/15 0511 07/23/15  NA 139 140 139 139  K 3.6 4.0 3.9 4.1  CL 103 105 103  --   CO2 27 30 31   --   GLUCOSE 75 103* 98  --    BUN 41* 28* 20 20  CREATININE 1.66* 1.00 0.91 0.9  CALCIUM 8.6* 8.8* 8.7*  --    Liver Function Tests:  Recent Labs  07/23/15  AST 21  ALT 16  ALKPHOS 59   No results for input(s): LIPASE, AMYLASE in the last 8760 hours. No results for input(s): AMMONIA in the last 8760 hours. CBC:  Recent Labs  07/16/15 2239 07/17/15 0327 07/23/15  WBC 9.5 7.6 4.9  NEUTROABS 8.0*  --   --   HGB 13.6 12.7* 12.8*  HCT 39.0 37.6* 40*  MCV 90.5 90.2  --   PLT 205 189 165   Cardiac Enzymes: No results for input(s): CKTOTAL, CKMB, CKMBINDEX, TROPONINI in the last 8760 hours. BNP: Invalid input(s): POCBNP CBG: No results for input(s): GLUCAP in the last 8760 hours.  Procedures and Imaging Studies During Stay: Dg Orthopantogram  07/21/2015  CLINICAL DATA:  Right lower jaw pain.  Poor dentition. EXAM: ORTHOPANTOGRAM/PANORAMIC COMPARISON:  None. FINDINGS: No evidence of mandible fracture or other focal bone lesions. Multiple dental restorations are seen. Mild periapical lucency is seen involving the right mandibular second molar. IMPRESSION: Mild periapical lucency involving the right mandibular second molar. Dedicated dental radiographs suggests for further evaluation. Electronically Signed   By: Earle Gell M.D.   On: 07/21/2015 09:09    Assessment/Plan:   1. BPH (benign prostatic hyperplasia) Stable on Avodart 0.5 mg Tablet daily.   2. Protein-calorie malnutrition, severe Has improved on protein supplements Med pass 60 mls  twice daily   3. History of CVA (cerebrovascular accident)  B/p stable.Continue on Plavix 75 mg Tablet and Pravastatin 20 mg Tablet. Will Discharge to ALF with PT/OT for ROM, Exercise, Gait Stability and Muscle strengthening. He will also require a standard wheelchair to allow current level of independence with ADL's.  Follow up with ALF provider.   4. HLD (hyperlipidemia) Continue Pravastatin  20 mg Tablet. Follow up with ALF PCP to monitor lipid panel.   5.  Localized edema Currently on HCZT 12.5 mg Tablet. Ted hose recommended but patient states has own ted hose at home will wear them upon D/C to ALF. Encourage to wear Ted hose on in the morning and off at bedtime. Encouraged to elevate legs.   6. Afib  Continue on digoxin 0.25mg  Tablet   7. Slow Transit constipation  Miralax QOD effective.   8. Gait Abnormality  Has worked with PT/OT stable to discharge to ALF with PT/OT for ROM, Exercise, Gait Stability and Muscle strengthening. He will require a standard wheelchair to allow current level of independence with ADL's.     Patient is being discharged with the following home health services:    PT/OT for ROM, Exercise, Gait Stability and Muscle strengthening.   Patient is being discharged with the following durable medical equipment:    He will also require a standard wheelchair to allow current level of independence with ADL's.   RX written for one month supply.   Patient has been advised to f/u with their PCP in 1-2 weeks to bring them up to date on their rehab stay.  Social services at facility was responsible for arranging this appointment.  Pt was provided with a 30 day supply of prescriptions for medications and refills must be obtained from their PCP.  For controlled substances, a more limited supply may be provided adequate until PCP appointment only.  Future labs/tests needed: CBC, CMP, Lipid panel with ALF PCP

## 2015-08-22 ENCOUNTER — Non-Acute Institutional Stay (SKILLED_NURSING_FACILITY): Payer: Medicare Other | Admitting: Internal Medicine

## 2015-08-22 ENCOUNTER — Encounter: Payer: Self-pay | Admitting: Internal Medicine

## 2015-08-22 DIAGNOSIS — M8580 Other specified disorders of bone density and structure, unspecified site: Secondary | ICD-10-CM | POA: Insufficient documentation

## 2015-08-22 DIAGNOSIS — K219 Gastro-esophageal reflux disease without esophagitis: Secondary | ICD-10-CM | POA: Insufficient documentation

## 2015-08-22 DIAGNOSIS — M47812 Spondylosis without myelopathy or radiculopathy, cervical region: Secondary | ICD-10-CM | POA: Insufficient documentation

## 2015-08-22 DIAGNOSIS — R634 Abnormal weight loss: Secondary | ICD-10-CM | POA: Diagnosis not present

## 2015-08-22 DIAGNOSIS — K5730 Diverticulosis of large intestine without perforation or abscess without bleeding: Secondary | ICD-10-CM

## 2015-08-22 DIAGNOSIS — R269 Unspecified abnormalities of gait and mobility: Secondary | ICD-10-CM

## 2015-08-22 DIAGNOSIS — D126 Benign neoplasm of colon, unspecified: Secondary | ICD-10-CM | POA: Insufficient documentation

## 2015-08-22 DIAGNOSIS — R600 Localized edema: Secondary | ICD-10-CM | POA: Diagnosis not present

## 2015-08-22 DIAGNOSIS — R5381 Other malaise: Secondary | ICD-10-CM

## 2015-08-22 DIAGNOSIS — R2681 Unsteadiness on feet: Secondary | ICD-10-CM | POA: Insufficient documentation

## 2015-08-22 DIAGNOSIS — R0989 Other specified symptoms and signs involving the circulatory and respiratory systems: Secondary | ICD-10-CM | POA: Insufficient documentation

## 2015-08-22 DIAGNOSIS — C439 Malignant melanoma of skin, unspecified: Secondary | ICD-10-CM | POA: Insufficient documentation

## 2015-08-22 HISTORY — DX: Unspecified abnormalities of gait and mobility: R26.9

## 2015-08-22 HISTORY — DX: Other malaise: R53.81

## 2015-08-22 HISTORY — DX: Diverticulosis of large intestine without perforation or abscess without bleeding: K57.30

## 2015-08-22 HISTORY — DX: Spondylosis without myelopathy or radiculopathy, cervical region: M47.812

## 2015-08-22 NOTE — Progress Notes (Signed)
History and Physical   Location:  [facility] Room Number: [id number]  Place of Service:  SNF (31)  PCP: Jeanmarie Hubert MD  Patient Care Team: Leanna Battles, MD as PCP - General (Internal Medicine)  Extended Emergency Contact Information Primary Emergency Contact: Hendrie,Bill Address: [address]          [place], [place] [address] [name] Phone: [phone] Mobile Phone: [phone] Relation: Brother Secondary Emergency Contact: [place] Mobile Phone: [phone] Relation: Relative  Code Status: DNR Goals of Care: Advanced Directive information Advanced Directives 08/22/2015  Does patient have an advance directive? Yes  Type of Advance Directive Out of facility DNR (pink MOST or yellow form)  Does patient want to make changes to advanced directive? No - Patient declined  Copy of advanced directive(s) in chart? Yes      Chief Complaint  Patient presents with  . New Admit To SNF    HPI: Patient is a 82 y.o. male seen today for admission to [facility]. Patient was admitted 08/21/15 .Patient was admitted to the hospital from 07/16/2015 through 07/21/2015. Following the fall he had a head laceration and became very weak. He no longer could walk as he had in the past. He has spent the last 4 weeks at [facility] for rehabilitation. He says he gained some strength there but is not safe to return home alone.  Patient describes a failure to thrive situation that had preceded his fall. He had lost at least 12-15 pounds over the preceding 2 months. He blames this on a postnasal drip that was going into his stomach and nauseating him. He did not eat well.  Patient actually has a chronic balance problem and was using a cane, but was able to get around his house independently where he lived alone.  There is a history remotely of a CVA, but he felt like he got over that pretty  well.  Other chronic conditions include BPH, hyperlipidemia, history of removal of basal cell cancer and melanoma from the left forearm, left carotid bruit, heart murmur, irregular heart rhythms, osteopenia, and removal of a tubular adenoma of the colon in 2007. Patient had a nuclear stress test September 2014 because of angina which showed evidence of an old inferolateral infarct. This study was done to assess him prior to surgery on an inguinal hernia. He did have a hernia repair and has done well following the surgery for that on 03/15/2013.  Patient states that ever since he fell this last time, he has not been able to walk. He was as if his strength came out of his legs and they simply won't hold him up anymore. He did have CT of the head and neck 07/16/2015 CT of the head showed a focal right posterior parietal parenchymal hypodensity compatible with an old infarct. There was mild mucoperiosteal thickening in the paranasal sinuses. The cervical spine x-ray showed osteopenia with multilevel degenerative changes most prominently at C5-6 and C6-7 with her was endplate irregularity and disc space narrowing. There is age-related cerebral atrophy and chronic microvascular ischemic changes.  Past Medical History  Diagnosis Date  . BPH (benign prostatic hyperplasia)   . Hypertension   . Diverticulosis   . Melanoma ([place])     left forearm  . Left carotid bruit   . Osteopenia   . Hyperlipidemia   . Basal cell carcinoma     left forearm  . Inguinal  hernia   . Stroke ([place]) 3/07    affected balance  . GERD (gastroesophageal reflux disease)     occurs rarely  . Anginal pain ([place])     nuclear stress 03/12/13 EPIC  . Tubular adenoma of colon 2007  . Diverticulosis   . Paroxysmal atrial fibrillation ([facility])   . Balance problem 07/17/2015  . Cervical spondylosis without myelopathy 08/22/2015  . Edema 08/18/2015  . Failure to thrive in adult 07/17/2015  . Fall 07/17/2015  . Gait disturbance 08/22/2015  .  History of CVA (cerebrovascular accident) 07/17/2015  . HLD (hyperlipidemia) 07/17/2015  . Loss of weight 08/22/2015  . Physical deconditioning 08/22/2015  . Cataract, nuclear 08/08/2014  . 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 Diverticulosis of colon without hemorrhage 08/22/2015  . Retinal hemorrhage 04/24/2014  . Rosacea blepharoconjunctivitis 11/20/2011  . Weakness 07/17/2015   Past Surgical History  Procedure Laterality Date  . Colonoscopy  2008  . Left forearm melanoma    . Prostate biopsy    . Eye surgery Right     cataract extraction with IOL  . Inguinal hernia repair Right 03/15/2013    Procedure: HERNIA REPAIR INGUINAL ADULT;  Surgeon: Odis Hollingshead, MD;  Location: [facility];  Service: General;  Laterality: Right;  . Insertion of mesh Right 03/15/2013    Procedure: INSERTION OF MESH;  Surgeon: Odis Hollingshead, MD;  Location: [facility];  Service: General;  Laterality: Right;    reports that he has quit smoking. He has never used smokeless tobacco. He reports that he drinks alcohol. He reports that he does not use illicit drugs. Social History   Social History  . Marital Status: Divorced    Spouse Name: N/A  . Number of Children: N/A  . Years of Education: N/A   Occupational History  . Not on file.   Social History Main Topics  . Smoking status: Former Research scientist (life sciences)  . Smokeless tobacco: Never Used  . Alcohol Use: Yes     Comment:  3 ounces wine nightly  . Drug Use: No  . Sexual Activity: Not on file   Other Topics Concern  . Not on file   Social History Narrative      Family History  Problem Relation Age of Onset  . Cancer Mother     lung and breast  . Heart disease Father     MI  . Cancer Sister     pancreatic    Health Maintenance  Topic Date Due  . INFLUENZA VACCINE  08/25/2016 (Originally 01/27/2015)  . PNA vac Low Risk Adult (1 of 2 - PCV13) 08/25/2016 (Originally 06/19/1992)  . ZOSTAVAX  08/25/2018 (Originally 06/20/1987)  . TETANUS/TDAP  07/15/2025     Allergies  Allergen Reactions  . Aspirin   . Eggs Or Egg-Derived Products Swelling    throat      Medication List       This list is accurate as of: 08/22/15  3:15 PM.  Always use your most recent med list.               benzocaine 10 % mucosal gel  Commonly known as:  ORAJEL  Use as directed in the mouth or throat as needed for mouth pain.     clopidogrel 75 MG tablet  Commonly known as:  PLAVIX  Take 75 mg by mouth daily. Start PLAVIX from 07/24/15 if no further hematuria is reported by patient and HB > 12.     digoxin 0.25 MG tablet  Commonly known as:  LANOXIN  Take 0.25 mg by mouth every morning.     dutasteride 0.5 MG capsule  Commonly known as:  AVODART  Take 0.5 mg by mouth every other day.     NITROSTAT 0.4 MG SL tablet  Generic drug:  nitroGLYCERIN  Place 0.4 mg under the tongue every 5 (five) minutes as needed for chest pain.     polyethylene glycol packet  Commonly known as:  MIRALAX / GLYCOLAX  Take 17 g by mouth every other day.     pravastatin 20 MG tablet  Commonly known as:  PRAVACHOL  Take 20 mg by mouth every evening.     triamterene-hydrochlorothiazide 37.5-25 MG capsule  Commonly known as:  DYAZIDE  Take 1 capsule by mouth daily.        Review of Systems  Constitutional: Negative for fever, chills, diaphoresis, activity change, appetite change, fatigue and unexpected weight change.       History of weight loss recently had a poor appetite related sinus problems.  HENT: Positive for postnasal drip, rhinorrhea and sinus pressure. Negative for congestion, ear pain, hearing loss, sore throat, tinnitus, trouble swallowing and voice change.        Chronic sinusitis.  Eyes:       Corrective lenses. Cataracts. History of retinal bleed.  Respiratory: Negative for cough, choking, chest tightness, shortness of breath and wheezing.   Cardiovascular: Negative for chest pain, palpitations and leg swelling.       Chronict leg swelling.  Recommended Ted hose but states has them at home will wear them at ALF.   Gastrointestinal: Negative for nausea, abdominal pain, diarrhea, constipation and abdominal distention.  Endocrine: Negative.  Negative for cold intolerance, heat intolerance, polydipsia, polyphagia and polyuria.  Genitourinary: Negative.  Negative for dysuria, urgency, frequency and testicular pain.       Not incontinent  Musculoskeletal: Positive for gait problem. Negative for myalgias, back pain, joint swelling, arthralgias and neck pain.  Skin: Negative for color change, pallor and rash.  Allergic/Immunologic: Negative.   Neurological: Positive for weakness. Negative for dizziness, tremors, seizures, syncope, speech difficulty, light-headedness, numbness and headaches.       Weakness of the legs. Poor balance. History of cerebral atrophy. History of microvascular disease of the brain. History of prior CVA in 2007.  Hematological: Negative for adenopathy. Does not bruise/bleed easily.  Psychiatric/Behavioral: Negative for hallucinations, behavioral problems, confusion, sleep disturbance and decreased concentration. The patient is not nervous/anxious.     Filed Vitals:   08/22/15 1505  BP: 110/60  Pulse: 73  Temp: 98.2 F (36.8 C)  TempSrc: Oral  Resp: 17  Height: 6\' 1"  (1.854 m)  Weight: 133 lb (60.328 kg)   Body mass index is 17.55 kg/(m^2). Physical Exam  Constitutional: He is oriented to person, place, and time. He appears well-developed.  Frail Elderly in no acute distress   HENT:  Head: Normocephalic.  Right Ear: External ear normal.  Left Ear: External ear normal.  Mouth/Throat: Oropharynx is clear and moist.  Eyes: Conjunctivae and EOM are normal. Pupils are equal, round, and reactive to light. Right eye exhibits no discharge. No scleral icterus.  Neck: Normal range of motion. No JVD present. No tracheal deviation present. No thyromegaly present.  Cardiovascular: Normal rate, regular rhythm, normal  heart sounds and intact distal pulses.  Exam reveals no gallop and no friction rub.   No murmur heard. Pulmonary/Chest: Effort normal and breath sounds normal. No respiratory distress. He has no wheezes.  He has no rales.  Abdominal: Soft. Bowel sounds are normal. He exhibits no distension and no mass. There is no tenderness. There is no rebound and no guarding.  Prior herniorrhaphy scar  Musculoskeletal: Normal range of motion. He exhibits no tenderness. Edema: edema of both lower legs and feet. Worse on the right.  Weakness of both lower extremities.  Lymphadenopathy:    He has no cervical adenopathy.  Neurological: He is oriented to person, place, and time.  Skin: Skin is warm. No rash noted. No erythema. No pallor.  Psychiatric: He has a normal mood and affect. His behavior is normal. Judgment and thought content normal.    Labs reviewed: Basic Metabolic Panel:  Recent Labs  07/18/15 0418 07/20/15 0835 07/21/15 0511 07/23/15  NA 139 140 139 139  K 3.6 4.0 3.9 4.1  CL 103 105 103  --   CO2 27 30 31   --   GLUCOSE 75 103* 98  --   BUN 41* 28* 20 20  CREATININE 1.66* 1.00 0.91 0.9  CALCIUM 8.6* 8.8* 8.7*  --    Liver Function Tests:  Recent Labs  07/23/15  AST 21  ALT 16  ALKPHOS 59   No results for input(s): LIPASE, AMYLASE in the last 8760 hours. No results for input(s): AMMONIA in the last 8760 hours. CBC:  Recent Labs  07/16/15 2239 07/17/15 0327 07/23/15  WBC 9.5 7.6 4.9  NEUTROABS 8.0*  --   --   HGB 13.6 12.7* 12.8*  HCT 39.0 37.6* 40*  MCV 90.5 90.2  --   PLT 205 189 165   Cardiac Enzymes: No results for input(s): CKTOTAL, CKMB, CKMBINDEX, TROPONINI in the last 8760 hours. BNP: Invalid input(s): POCBNP No results found for: HGBA1C Lab Results  Component Value Date   TSH 2.908 07/17/2015   No results found for: VITAMINB12 No results found for: FOLATE No results found for: IRON, TIBC, FERRITIN  Imaging and Procedures obtained prior to SNF  admission: Ct Head Wo Contrast  07/16/2015  CLINICAL DATA:  82 year old male with fall EXAM: CT HEAD WITHOUT CONTRAST CT CERVICAL SPINE WITHOUT CONTRAST TECHNIQUE: Multidetector CT imaging of the head and cervical spine was performed following the standard protocol without intravenous contrast. Multiplanar CT image reconstructions of the cervical spine were also generated. COMPARISON:  CT dated 09/20/2005 and MRI dated 10/22/2005 FINDINGS: CT HEAD FINDINGS There is slight prominence of the ventricles and sulci compatible with age-related volume loss. Mild periventricular and deep white matter hypodensities represent chronic microvascular ischemic changes. Focal right posterior parietal parenchymal hypodensity compatible with an old infarct. There is no intracranial hemorrhage. No mass effect or midline shift identified. Mild mucoperiosteal thickening of paranasal sinuses. No air-fluid level. The mastoid air cells are well aerated. There is posterior scalp laceration and hematoma. The calvarium is intact. CT CERVICAL SPINE FINDINGS There is no acute fracture or subluxation of the cervical spine.There is osteopenia with multilevel degenerative changes most prominent at C5-C6, and C6-C7 where there is endplate irregularity and disc space narrowing.The odontoid and spinous processes are intact.There is normal anatomic alignment of the C1-C2 lateral masses. The visualized soft tissues appear unremarkable. IMPRESSION: No acute intracranial hemorrhage. Age-related atrophy and chronic microvascular ischemic changes. No acute/traumatic cervical spine pathology. Electronically Signed   By: Anner Crete M.D.   On: 07/16/2015 21:16   Ct Cervical Spine Wo Contrast  07/16/2015  CLINICAL DATA:  82 year old male with fall EXAM: CT HEAD WITHOUT CONTRAST CT CERVICAL SPINE WITHOUT CONTRAST TECHNIQUE: Multidetector  CT imaging of the head and cervical spine was performed following the standard protocol without intravenous  contrast. Multiplanar CT image reconstructions of the cervical spine were also generated. COMPARISON:  CT dated 09/20/2005 and MRI dated 10/22/2005 FINDINGS: CT HEAD FINDINGS There is slight prominence of the ventricles and sulci compatible with age-related volume loss. Mild periventricular and deep white matter hypodensities represent chronic microvascular ischemic changes. Focal right posterior parietal parenchymal hypodensity compatible with an old infarct. There is no intracranial hemorrhage. No mass effect or midline shift identified. Mild mucoperiosteal thickening of paranasal sinuses. No air-fluid level. The mastoid air cells are well aerated. There is posterior scalp laceration and hematoma. The calvarium is intact. CT CERVICAL SPINE FINDINGS There is no acute fracture or subluxation of the cervical spine.There is osteopenia with multilevel degenerative changes most prominent at C5-C6, and C6-C7 where there is endplate irregularity and disc space narrowing.The odontoid and spinous processes are intact.There is normal anatomic alignment of the C1-C2 lateral masses. The visualized soft tissues appear unremarkable. IMPRESSION: No acute intracranial hemorrhage. Age-related atrophy and chronic microvascular ischemic changes. No acute/traumatic cervical spine pathology. Electronically Signed   By: Anner Crete M.D.   On: 07/16/2015 21:16   Dg Chest Port 1 View  07/17/2015  CLINICAL DATA:  Acute onset of cough.  Initial encounter. EXAM: PORTABLE CHEST 1 VIEW COMPARISON:  Chest radiograph performed 03/13/2013 FINDINGS: The lungs are hyperexpanded, with flattening of the hemidiaphragms, likely reflecting COPD. There is no evidence of focal opacification, pleural effusion or pneumothorax. The cardiomediastinal silhouette is within normal limits. No acute osseous abnormalities are seen. Clips are noted overlying the left axilla. IMPRESSION: Findings suggest COPD.  Lungs otherwise grossly clear. Electronically  Signed   By: Garald Balding M.D.   On: 07/17/2015 02:00    Assessment/Plan  1. Physical deconditioning We will arrange consultation with occupational therapy and physical therapy. Goals will be to increase his strength, safe ambulation, and self-care.  2. Cervical spondylosis without myelopathy This is a chronic problem. It is unclear to me what relationship, if any, this may have to be loss of strength in his legs following his fall.  3. Gait disturbance Patient describes increased weakness in the legs and loss of balance. There are multiple potential etiologies for this. He actually could've had another little stroke. Only a CT of the brain was done and his last hospital stay. It may require an MRI of the brain. There could be metabolic disturbances, but I think this is unlikely. I have reordered a CBC, TSH, lipid panel, CMP, and creatine kinase. He may need a neurologic consultation. MRI of the brain and MRI of the cervical spine are other tests that could be ordered.  4. Loss of weight Patient seems confident that he'll be regaining some of his lost weight because his appetite has improved. There are issues with postnasal drip and oral discomfort related to bone protruding through his gum. These are resolved at this time.  5. Localized edema Patient requests that Dyazide be resumed to help this problem. He states it seemed to work well in the past. He also has worn compression stockings in the past. I have not ordered the stockings today but since he has them already it seems logical for him to use them again.

## 2015-08-25 DIAGNOSIS — R011 Cardiac murmur, unspecified: Secondary | ICD-10-CM | POA: Diagnosis not present

## 2015-08-25 DIAGNOSIS — R2681 Unsteadiness on feet: Secondary | ICD-10-CM | POA: Diagnosis not present

## 2015-08-25 DIAGNOSIS — M8580 Other specified disorders of bone density and structure, unspecified site: Secondary | ICD-10-CM | POA: Diagnosis not present

## 2015-08-25 DIAGNOSIS — R29898 Other symptoms and signs involving the musculoskeletal system: Secondary | ICD-10-CM | POA: Diagnosis not present

## 2015-08-25 DIAGNOSIS — R1312 Dysphagia, oropharyngeal phase: Secondary | ICD-10-CM | POA: Diagnosis not present

## 2015-08-25 DIAGNOSIS — M6281 Muscle weakness (generalized): Secondary | ICD-10-CM | POA: Diagnosis not present

## 2015-08-25 DIAGNOSIS — K1379 Other lesions of oral mucosa: Secondary | ICD-10-CM | POA: Diagnosis not present

## 2015-08-25 DIAGNOSIS — R296 Repeated falls: Secondary | ICD-10-CM | POA: Diagnosis not present

## 2015-08-25 DIAGNOSIS — I10 Essential (primary) hypertension: Secondary | ICD-10-CM | POA: Diagnosis not present

## 2015-08-25 DIAGNOSIS — Z9181 History of falling: Secondary | ICD-10-CM | POA: Diagnosis not present

## 2015-08-25 DIAGNOSIS — M47892 Other spondylosis, cervical region: Secondary | ICD-10-CM | POA: Diagnosis not present

## 2015-08-26 DIAGNOSIS — R5381 Other malaise: Secondary | ICD-10-CM | POA: Diagnosis not present

## 2015-08-26 DIAGNOSIS — R29898 Other symptoms and signs involving the musculoskeletal system: Secondary | ICD-10-CM | POA: Diagnosis not present

## 2015-08-26 DIAGNOSIS — R2681 Unsteadiness on feet: Secondary | ICD-10-CM | POA: Diagnosis not present

## 2015-08-26 DIAGNOSIS — Z9181 History of falling: Secondary | ICD-10-CM | POA: Diagnosis not present

## 2015-08-26 DIAGNOSIS — R296 Repeated falls: Secondary | ICD-10-CM | POA: Diagnosis not present

## 2015-08-26 DIAGNOSIS — M6281 Muscle weakness (generalized): Secondary | ICD-10-CM | POA: Diagnosis not present

## 2015-08-26 DIAGNOSIS — R1312 Dysphagia, oropharyngeal phase: Secondary | ICD-10-CM | POA: Diagnosis not present

## 2015-08-26 LAB — LIPID PANEL
Cholesterol: 113 mg/dL (ref 0–200)
HDL: 48 mg/dL (ref 35–70)
LDL CALC: 53 mg/dL
Triglycerides: 61 mg/dL (ref 40–160)

## 2015-08-26 LAB — HEPATIC FUNCTION PANEL
ALK PHOS: 57 U/L (ref 25–125)
ALT: 7 U/L — AB (ref 10–40)
AST: 11 U/L — AB (ref 14–40)
Bilirubin, Total: 0.4 mg/dL

## 2015-08-26 LAB — CBC AND DIFFERENTIAL
HEMATOCRIT: 33 % — AB (ref 41–53)
HEMOGLOBIN: 10.8 g/dL — AB (ref 13.5–17.5)
PLATELETS: 227 10*3/uL (ref 150–399)
WBC: 3.6 10*3/mL

## 2015-08-26 LAB — BASIC METABOLIC PANEL
BUN: 30 mg/dL — AB (ref 4–21)
Creatinine: 0.9 mg/dL (ref 0.6–1.3)
GLUCOSE: 88 mg/dL
Potassium: 4.1 mmol/L (ref 3.4–5.3)
Sodium: 139 mmol/L (ref 137–147)

## 2015-08-26 LAB — TSH: TSH: 4.91 u[IU]/mL (ref 0.41–5.90)

## 2015-08-27 DIAGNOSIS — M6281 Muscle weakness (generalized): Secondary | ICD-10-CM | POA: Diagnosis not present

## 2015-08-27 DIAGNOSIS — M47892 Other spondylosis, cervical region: Secondary | ICD-10-CM | POA: Diagnosis not present

## 2015-08-27 DIAGNOSIS — M8580 Other specified disorders of bone density and structure, unspecified site: Secondary | ICD-10-CM | POA: Diagnosis not present

## 2015-08-27 DIAGNOSIS — Z9181 History of falling: Secondary | ICD-10-CM | POA: Diagnosis not present

## 2015-08-27 DIAGNOSIS — R011 Cardiac murmur, unspecified: Secondary | ICD-10-CM | POA: Diagnosis not present

## 2015-08-27 DIAGNOSIS — R2681 Unsteadiness on feet: Secondary | ICD-10-CM | POA: Diagnosis not present

## 2015-08-27 DIAGNOSIS — R1312 Dysphagia, oropharyngeal phase: Secondary | ICD-10-CM | POA: Diagnosis not present

## 2015-08-27 DIAGNOSIS — K1379 Other lesions of oral mucosa: Secondary | ICD-10-CM | POA: Diagnosis not present

## 2015-08-27 DIAGNOSIS — R296 Repeated falls: Secondary | ICD-10-CM | POA: Diagnosis not present

## 2015-08-27 DIAGNOSIS — R29898 Other symptoms and signs involving the musculoskeletal system: Secondary | ICD-10-CM | POA: Diagnosis not present

## 2015-08-27 DIAGNOSIS — I10 Essential (primary) hypertension: Secondary | ICD-10-CM | POA: Diagnosis not present

## 2015-08-28 ENCOUNTER — Encounter: Payer: Self-pay | Admitting: Nurse Practitioner

## 2015-08-28 DIAGNOSIS — R29898 Other symptoms and signs involving the musculoskeletal system: Secondary | ICD-10-CM | POA: Diagnosis not present

## 2015-08-28 DIAGNOSIS — R2681 Unsteadiness on feet: Secondary | ICD-10-CM | POA: Diagnosis not present

## 2015-08-28 DIAGNOSIS — D638 Anemia in other chronic diseases classified elsewhere: Secondary | ICD-10-CM | POA: Insufficient documentation

## 2015-08-28 DIAGNOSIS — R1312 Dysphagia, oropharyngeal phase: Secondary | ICD-10-CM | POA: Diagnosis not present

## 2015-08-28 DIAGNOSIS — Z9181 History of falling: Secondary | ICD-10-CM | POA: Diagnosis not present

## 2015-08-28 DIAGNOSIS — M6281 Muscle weakness (generalized): Secondary | ICD-10-CM | POA: Diagnosis not present

## 2015-08-28 DIAGNOSIS — R7989 Other specified abnormal findings of blood chemistry: Secondary | ICD-10-CM | POA: Insufficient documentation

## 2015-08-28 DIAGNOSIS — R296 Repeated falls: Secondary | ICD-10-CM | POA: Diagnosis not present

## 2015-08-29 DIAGNOSIS — M6281 Muscle weakness (generalized): Secondary | ICD-10-CM | POA: Diagnosis not present

## 2015-08-29 DIAGNOSIS — Z9181 History of falling: Secondary | ICD-10-CM | POA: Diagnosis not present

## 2015-08-29 DIAGNOSIS — R1312 Dysphagia, oropharyngeal phase: Secondary | ICD-10-CM | POA: Diagnosis not present

## 2015-08-29 DIAGNOSIS — R2681 Unsteadiness on feet: Secondary | ICD-10-CM | POA: Diagnosis not present

## 2015-08-29 DIAGNOSIS — R29898 Other symptoms and signs involving the musculoskeletal system: Secondary | ICD-10-CM | POA: Diagnosis not present

## 2015-08-29 DIAGNOSIS — R296 Repeated falls: Secondary | ICD-10-CM | POA: Diagnosis not present

## 2015-09-01 DIAGNOSIS — R2681 Unsteadiness on feet: Secondary | ICD-10-CM | POA: Diagnosis not present

## 2015-09-01 DIAGNOSIS — M6281 Muscle weakness (generalized): Secondary | ICD-10-CM | POA: Diagnosis not present

## 2015-09-01 DIAGNOSIS — Z9181 History of falling: Secondary | ICD-10-CM | POA: Diagnosis not present

## 2015-09-01 DIAGNOSIS — R1312 Dysphagia, oropharyngeal phase: Secondary | ICD-10-CM | POA: Diagnosis not present

## 2015-09-01 DIAGNOSIS — R296 Repeated falls: Secondary | ICD-10-CM | POA: Diagnosis not present

## 2015-09-01 DIAGNOSIS — R29898 Other symptoms and signs involving the musculoskeletal system: Secondary | ICD-10-CM | POA: Diagnosis not present

## 2015-09-02 DIAGNOSIS — Z9181 History of falling: Secondary | ICD-10-CM | POA: Diagnosis not present

## 2015-09-02 DIAGNOSIS — R29898 Other symptoms and signs involving the musculoskeletal system: Secondary | ICD-10-CM | POA: Diagnosis not present

## 2015-09-02 DIAGNOSIS — R1312 Dysphagia, oropharyngeal phase: Secondary | ICD-10-CM | POA: Diagnosis not present

## 2015-09-02 DIAGNOSIS — R296 Repeated falls: Secondary | ICD-10-CM | POA: Diagnosis not present

## 2015-09-02 DIAGNOSIS — M6281 Muscle weakness (generalized): Secondary | ICD-10-CM | POA: Diagnosis not present

## 2015-09-02 DIAGNOSIS — R2681 Unsteadiness on feet: Secondary | ICD-10-CM | POA: Diagnosis not present

## 2015-09-03 DIAGNOSIS — R29898 Other symptoms and signs involving the musculoskeletal system: Secondary | ICD-10-CM | POA: Diagnosis not present

## 2015-09-03 DIAGNOSIS — M6281 Muscle weakness (generalized): Secondary | ICD-10-CM | POA: Diagnosis not present

## 2015-09-03 DIAGNOSIS — Z9181 History of falling: Secondary | ICD-10-CM | POA: Diagnosis not present

## 2015-09-03 DIAGNOSIS — R2681 Unsteadiness on feet: Secondary | ICD-10-CM | POA: Diagnosis not present

## 2015-09-03 DIAGNOSIS — R296 Repeated falls: Secondary | ICD-10-CM | POA: Diagnosis not present

## 2015-09-03 DIAGNOSIS — R1312 Dysphagia, oropharyngeal phase: Secondary | ICD-10-CM | POA: Diagnosis not present

## 2015-09-04 DIAGNOSIS — Z9181 History of falling: Secondary | ICD-10-CM | POA: Diagnosis not present

## 2015-09-04 DIAGNOSIS — R1312 Dysphagia, oropharyngeal phase: Secondary | ICD-10-CM | POA: Diagnosis not present

## 2015-09-04 DIAGNOSIS — M6281 Muscle weakness (generalized): Secondary | ICD-10-CM | POA: Diagnosis not present

## 2015-09-04 DIAGNOSIS — R29898 Other symptoms and signs involving the musculoskeletal system: Secondary | ICD-10-CM | POA: Diagnosis not present

## 2015-09-04 DIAGNOSIS — R2681 Unsteadiness on feet: Secondary | ICD-10-CM | POA: Diagnosis not present

## 2015-09-04 DIAGNOSIS — R296 Repeated falls: Secondary | ICD-10-CM | POA: Diagnosis not present

## 2015-09-08 DIAGNOSIS — R29898 Other symptoms and signs involving the musculoskeletal system: Secondary | ICD-10-CM | POA: Diagnosis not present

## 2015-09-08 DIAGNOSIS — Z9181 History of falling: Secondary | ICD-10-CM | POA: Diagnosis not present

## 2015-09-08 DIAGNOSIS — R2681 Unsteadiness on feet: Secondary | ICD-10-CM | POA: Diagnosis not present

## 2015-09-08 DIAGNOSIS — R1312 Dysphagia, oropharyngeal phase: Secondary | ICD-10-CM | POA: Diagnosis not present

## 2015-09-08 DIAGNOSIS — R296 Repeated falls: Secondary | ICD-10-CM | POA: Diagnosis not present

## 2015-09-08 DIAGNOSIS — M6281 Muscle weakness (generalized): Secondary | ICD-10-CM | POA: Diagnosis not present

## 2015-09-09 DIAGNOSIS — Z9181 History of falling: Secondary | ICD-10-CM | POA: Diagnosis not present

## 2015-09-09 DIAGNOSIS — R1312 Dysphagia, oropharyngeal phase: Secondary | ICD-10-CM | POA: Diagnosis not present

## 2015-09-09 DIAGNOSIS — Z79899 Other long term (current) drug therapy: Secondary | ICD-10-CM | POA: Diagnosis not present

## 2015-09-09 DIAGNOSIS — R29898 Other symptoms and signs involving the musculoskeletal system: Secondary | ICD-10-CM | POA: Diagnosis not present

## 2015-09-09 DIAGNOSIS — R296 Repeated falls: Secondary | ICD-10-CM | POA: Diagnosis not present

## 2015-09-09 DIAGNOSIS — Z5181 Encounter for therapeutic drug level monitoring: Secondary | ICD-10-CM | POA: Diagnosis not present

## 2015-09-09 DIAGNOSIS — M6281 Muscle weakness (generalized): Secondary | ICD-10-CM | POA: Diagnosis not present

## 2015-09-09 DIAGNOSIS — R2681 Unsteadiness on feet: Secondary | ICD-10-CM | POA: Diagnosis not present

## 2015-09-09 DIAGNOSIS — R011 Cardiac murmur, unspecified: Secondary | ICD-10-CM | POA: Diagnosis not present

## 2015-09-10 DIAGNOSIS — M6281 Muscle weakness (generalized): Secondary | ICD-10-CM | POA: Diagnosis not present

## 2015-09-10 DIAGNOSIS — R296 Repeated falls: Secondary | ICD-10-CM | POA: Diagnosis not present

## 2015-09-10 DIAGNOSIS — R29898 Other symptoms and signs involving the musculoskeletal system: Secondary | ICD-10-CM | POA: Diagnosis not present

## 2015-09-10 DIAGNOSIS — R1312 Dysphagia, oropharyngeal phase: Secondary | ICD-10-CM | POA: Diagnosis not present

## 2015-09-10 DIAGNOSIS — Z9181 History of falling: Secondary | ICD-10-CM | POA: Diagnosis not present

## 2015-09-10 DIAGNOSIS — R2681 Unsteadiness on feet: Secondary | ICD-10-CM | POA: Diagnosis not present

## 2015-09-11 DIAGNOSIS — R2681 Unsteadiness on feet: Secondary | ICD-10-CM | POA: Diagnosis not present

## 2015-09-11 DIAGNOSIS — R29898 Other symptoms and signs involving the musculoskeletal system: Secondary | ICD-10-CM | POA: Diagnosis not present

## 2015-09-11 DIAGNOSIS — R1312 Dysphagia, oropharyngeal phase: Secondary | ICD-10-CM | POA: Diagnosis not present

## 2015-09-11 DIAGNOSIS — M6281 Muscle weakness (generalized): Secondary | ICD-10-CM | POA: Diagnosis not present

## 2015-09-11 DIAGNOSIS — Z9181 History of falling: Secondary | ICD-10-CM | POA: Diagnosis not present

## 2015-09-11 DIAGNOSIS — R296 Repeated falls: Secondary | ICD-10-CM | POA: Diagnosis not present

## 2015-09-15 DIAGNOSIS — M6281 Muscle weakness (generalized): Secondary | ICD-10-CM | POA: Diagnosis not present

## 2015-09-15 DIAGNOSIS — R2681 Unsteadiness on feet: Secondary | ICD-10-CM | POA: Diagnosis not present

## 2015-09-15 DIAGNOSIS — R29898 Other symptoms and signs involving the musculoskeletal system: Secondary | ICD-10-CM | POA: Diagnosis not present

## 2015-09-15 DIAGNOSIS — R296 Repeated falls: Secondary | ICD-10-CM | POA: Diagnosis not present

## 2015-09-15 DIAGNOSIS — Z9181 History of falling: Secondary | ICD-10-CM | POA: Diagnosis not present

## 2015-09-15 DIAGNOSIS — R1312 Dysphagia, oropharyngeal phase: Secondary | ICD-10-CM | POA: Diagnosis not present

## 2015-09-16 DIAGNOSIS — M6281 Muscle weakness (generalized): Secondary | ICD-10-CM | POA: Diagnosis not present

## 2015-09-16 DIAGNOSIS — R2681 Unsteadiness on feet: Secondary | ICD-10-CM | POA: Diagnosis not present

## 2015-09-16 DIAGNOSIS — R296 Repeated falls: Secondary | ICD-10-CM | POA: Diagnosis not present

## 2015-09-16 DIAGNOSIS — R1312 Dysphagia, oropharyngeal phase: Secondary | ICD-10-CM | POA: Diagnosis not present

## 2015-09-16 DIAGNOSIS — Z9181 History of falling: Secondary | ICD-10-CM | POA: Diagnosis not present

## 2015-09-16 DIAGNOSIS — R29898 Other symptoms and signs involving the musculoskeletal system: Secondary | ICD-10-CM | POA: Diagnosis not present

## 2015-09-17 DIAGNOSIS — R296 Repeated falls: Secondary | ICD-10-CM | POA: Diagnosis not present

## 2015-09-17 DIAGNOSIS — R1312 Dysphagia, oropharyngeal phase: Secondary | ICD-10-CM | POA: Diagnosis not present

## 2015-09-17 DIAGNOSIS — R29898 Other symptoms and signs involving the musculoskeletal system: Secondary | ICD-10-CM | POA: Diagnosis not present

## 2015-09-17 DIAGNOSIS — R2681 Unsteadiness on feet: Secondary | ICD-10-CM | POA: Diagnosis not present

## 2015-09-17 DIAGNOSIS — M6281 Muscle weakness (generalized): Secondary | ICD-10-CM | POA: Diagnosis not present

## 2015-09-17 DIAGNOSIS — Z9181 History of falling: Secondary | ICD-10-CM | POA: Diagnosis not present

## 2015-09-18 ENCOUNTER — Encounter: Payer: Self-pay | Admitting: Nurse Practitioner

## 2015-09-18 DIAGNOSIS — R2681 Unsteadiness on feet: Secondary | ICD-10-CM | POA: Diagnosis not present

## 2015-09-18 DIAGNOSIS — I499 Cardiac arrhythmia, unspecified: Secondary | ICD-10-CM | POA: Insufficient documentation

## 2015-09-18 DIAGNOSIS — R29898 Other symptoms and signs involving the musculoskeletal system: Secondary | ICD-10-CM | POA: Diagnosis not present

## 2015-09-18 DIAGNOSIS — R1312 Dysphagia, oropharyngeal phase: Secondary | ICD-10-CM | POA: Diagnosis not present

## 2015-09-18 DIAGNOSIS — R296 Repeated falls: Secondary | ICD-10-CM | POA: Diagnosis not present

## 2015-09-18 DIAGNOSIS — Z9181 History of falling: Secondary | ICD-10-CM | POA: Diagnosis not present

## 2015-09-18 DIAGNOSIS — M6281 Muscle weakness (generalized): Secondary | ICD-10-CM | POA: Diagnosis not present

## 2015-09-19 DIAGNOSIS — R296 Repeated falls: Secondary | ICD-10-CM | POA: Diagnosis not present

## 2015-09-19 DIAGNOSIS — R1312 Dysphagia, oropharyngeal phase: Secondary | ICD-10-CM | POA: Diagnosis not present

## 2015-09-19 DIAGNOSIS — R29898 Other symptoms and signs involving the musculoskeletal system: Secondary | ICD-10-CM | POA: Diagnosis not present

## 2015-09-19 DIAGNOSIS — Z9181 History of falling: Secondary | ICD-10-CM | POA: Diagnosis not present

## 2015-09-19 DIAGNOSIS — M6281 Muscle weakness (generalized): Secondary | ICD-10-CM | POA: Diagnosis not present

## 2015-09-19 DIAGNOSIS — R2681 Unsteadiness on feet: Secondary | ICD-10-CM | POA: Diagnosis not present

## 2015-09-22 DIAGNOSIS — R1312 Dysphagia, oropharyngeal phase: Secondary | ICD-10-CM | POA: Diagnosis not present

## 2015-09-22 DIAGNOSIS — M6281 Muscle weakness (generalized): Secondary | ICD-10-CM | POA: Diagnosis not present

## 2015-09-22 DIAGNOSIS — R2681 Unsteadiness on feet: Secondary | ICD-10-CM | POA: Diagnosis not present

## 2015-09-22 DIAGNOSIS — R296 Repeated falls: Secondary | ICD-10-CM | POA: Diagnosis not present

## 2015-09-22 DIAGNOSIS — R29898 Other symptoms and signs involving the musculoskeletal system: Secondary | ICD-10-CM | POA: Diagnosis not present

## 2015-09-22 DIAGNOSIS — Z9181 History of falling: Secondary | ICD-10-CM | POA: Diagnosis not present

## 2015-09-23 DIAGNOSIS — Z9181 History of falling: Secondary | ICD-10-CM | POA: Diagnosis not present

## 2015-09-23 DIAGNOSIS — R1312 Dysphagia, oropharyngeal phase: Secondary | ICD-10-CM | POA: Diagnosis not present

## 2015-09-23 DIAGNOSIS — R2681 Unsteadiness on feet: Secondary | ICD-10-CM | POA: Diagnosis not present

## 2015-09-23 DIAGNOSIS — M6281 Muscle weakness (generalized): Secondary | ICD-10-CM | POA: Diagnosis not present

## 2015-09-23 DIAGNOSIS — R29898 Other symptoms and signs involving the musculoskeletal system: Secondary | ICD-10-CM | POA: Diagnosis not present

## 2015-09-23 DIAGNOSIS — R296 Repeated falls: Secondary | ICD-10-CM | POA: Diagnosis not present

## 2015-09-24 DIAGNOSIS — R1312 Dysphagia, oropharyngeal phase: Secondary | ICD-10-CM | POA: Diagnosis not present

## 2015-09-24 DIAGNOSIS — Z9181 History of falling: Secondary | ICD-10-CM | POA: Diagnosis not present

## 2015-09-24 DIAGNOSIS — R296 Repeated falls: Secondary | ICD-10-CM | POA: Diagnosis not present

## 2015-09-24 DIAGNOSIS — R29898 Other symptoms and signs involving the musculoskeletal system: Secondary | ICD-10-CM | POA: Diagnosis not present

## 2015-09-24 DIAGNOSIS — M6281 Muscle weakness (generalized): Secondary | ICD-10-CM | POA: Diagnosis not present

## 2015-09-24 DIAGNOSIS — R2681 Unsteadiness on feet: Secondary | ICD-10-CM | POA: Diagnosis not present

## 2015-09-25 DIAGNOSIS — R2681 Unsteadiness on feet: Secondary | ICD-10-CM | POA: Diagnosis not present

## 2015-09-25 DIAGNOSIS — R296 Repeated falls: Secondary | ICD-10-CM | POA: Diagnosis not present

## 2015-09-25 DIAGNOSIS — Z9181 History of falling: Secondary | ICD-10-CM | POA: Diagnosis not present

## 2015-09-25 DIAGNOSIS — R29898 Other symptoms and signs involving the musculoskeletal system: Secondary | ICD-10-CM | POA: Diagnosis not present

## 2015-09-25 DIAGNOSIS — R1312 Dysphagia, oropharyngeal phase: Secondary | ICD-10-CM | POA: Diagnosis not present

## 2015-09-25 DIAGNOSIS — M6281 Muscle weakness (generalized): Secondary | ICD-10-CM | POA: Diagnosis not present

## 2015-09-26 DIAGNOSIS — M6281 Muscle weakness (generalized): Secondary | ICD-10-CM | POA: Diagnosis not present

## 2015-09-26 DIAGNOSIS — R1312 Dysphagia, oropharyngeal phase: Secondary | ICD-10-CM | POA: Diagnosis not present

## 2015-09-26 DIAGNOSIS — R2681 Unsteadiness on feet: Secondary | ICD-10-CM | POA: Diagnosis not present

## 2015-09-26 DIAGNOSIS — R296 Repeated falls: Secondary | ICD-10-CM | POA: Diagnosis not present

## 2015-09-26 DIAGNOSIS — R29898 Other symptoms and signs involving the musculoskeletal system: Secondary | ICD-10-CM | POA: Diagnosis not present

## 2015-09-26 DIAGNOSIS — Z9181 History of falling: Secondary | ICD-10-CM | POA: Diagnosis not present

## 2015-09-29 DIAGNOSIS — M6281 Muscle weakness (generalized): Secondary | ICD-10-CM | POA: Diagnosis not present

## 2015-09-29 DIAGNOSIS — M8580 Other specified disorders of bone density and structure, unspecified site: Secondary | ICD-10-CM | POA: Diagnosis not present

## 2015-09-29 DIAGNOSIS — K1379 Other lesions of oral mucosa: Secondary | ICD-10-CM | POA: Diagnosis not present

## 2015-09-29 DIAGNOSIS — R29898 Other symptoms and signs involving the musculoskeletal system: Secondary | ICD-10-CM | POA: Diagnosis not present

## 2015-09-29 DIAGNOSIS — M47892 Other spondylosis, cervical region: Secondary | ICD-10-CM | POA: Diagnosis not present

## 2015-09-29 DIAGNOSIS — R1312 Dysphagia, oropharyngeal phase: Secondary | ICD-10-CM | POA: Diagnosis not present

## 2015-09-29 DIAGNOSIS — R011 Cardiac murmur, unspecified: Secondary | ICD-10-CM | POA: Diagnosis not present

## 2015-09-29 DIAGNOSIS — I10 Essential (primary) hypertension: Secondary | ICD-10-CM | POA: Diagnosis not present

## 2015-09-29 DIAGNOSIS — R296 Repeated falls: Secondary | ICD-10-CM | POA: Diagnosis not present

## 2015-09-29 DIAGNOSIS — R2681 Unsteadiness on feet: Secondary | ICD-10-CM | POA: Diagnosis not present

## 2015-09-29 DIAGNOSIS — Z9181 History of falling: Secondary | ICD-10-CM | POA: Diagnosis not present

## 2015-09-30 DIAGNOSIS — R1312 Dysphagia, oropharyngeal phase: Secondary | ICD-10-CM | POA: Diagnosis not present

## 2015-09-30 DIAGNOSIS — R2681 Unsteadiness on feet: Secondary | ICD-10-CM | POA: Diagnosis not present

## 2015-09-30 DIAGNOSIS — R296 Repeated falls: Secondary | ICD-10-CM | POA: Diagnosis not present

## 2015-09-30 DIAGNOSIS — Z9181 History of falling: Secondary | ICD-10-CM | POA: Diagnosis not present

## 2015-09-30 DIAGNOSIS — R29898 Other symptoms and signs involving the musculoskeletal system: Secondary | ICD-10-CM | POA: Diagnosis not present

## 2015-09-30 DIAGNOSIS — M6281 Muscle weakness (generalized): Secondary | ICD-10-CM | POA: Diagnosis not present

## 2015-10-01 DIAGNOSIS — R296 Repeated falls: Secondary | ICD-10-CM | POA: Diagnosis not present

## 2015-10-01 DIAGNOSIS — R29898 Other symptoms and signs involving the musculoskeletal system: Secondary | ICD-10-CM | POA: Diagnosis not present

## 2015-10-01 DIAGNOSIS — R1312 Dysphagia, oropharyngeal phase: Secondary | ICD-10-CM | POA: Diagnosis not present

## 2015-10-01 DIAGNOSIS — R2681 Unsteadiness on feet: Secondary | ICD-10-CM | POA: Diagnosis not present

## 2015-10-01 DIAGNOSIS — Z9181 History of falling: Secondary | ICD-10-CM | POA: Diagnosis not present

## 2015-10-01 DIAGNOSIS — M6281 Muscle weakness (generalized): Secondary | ICD-10-CM | POA: Diagnosis not present

## 2015-10-02 DIAGNOSIS — R296 Repeated falls: Secondary | ICD-10-CM | POA: Diagnosis not present

## 2015-10-02 DIAGNOSIS — R2681 Unsteadiness on feet: Secondary | ICD-10-CM | POA: Diagnosis not present

## 2015-10-02 DIAGNOSIS — R29898 Other symptoms and signs involving the musculoskeletal system: Secondary | ICD-10-CM | POA: Diagnosis not present

## 2015-10-02 DIAGNOSIS — M6281 Muscle weakness (generalized): Secondary | ICD-10-CM | POA: Diagnosis not present

## 2015-10-02 DIAGNOSIS — Z9181 History of falling: Secondary | ICD-10-CM | POA: Diagnosis not present

## 2015-10-02 DIAGNOSIS — R1312 Dysphagia, oropharyngeal phase: Secondary | ICD-10-CM | POA: Diagnosis not present

## 2015-10-06 DIAGNOSIS — R296 Repeated falls: Secondary | ICD-10-CM | POA: Diagnosis not present

## 2015-10-06 DIAGNOSIS — M6281 Muscle weakness (generalized): Secondary | ICD-10-CM | POA: Diagnosis not present

## 2015-10-06 DIAGNOSIS — Z9181 History of falling: Secondary | ICD-10-CM | POA: Diagnosis not present

## 2015-10-06 DIAGNOSIS — R29898 Other symptoms and signs involving the musculoskeletal system: Secondary | ICD-10-CM | POA: Diagnosis not present

## 2015-10-06 DIAGNOSIS — R1312 Dysphagia, oropharyngeal phase: Secondary | ICD-10-CM | POA: Diagnosis not present

## 2015-10-06 DIAGNOSIS — R2681 Unsteadiness on feet: Secondary | ICD-10-CM | POA: Diagnosis not present

## 2015-10-09 ENCOUNTER — Encounter: Payer: Self-pay | Admitting: Internal Medicine

## 2015-10-16 ENCOUNTER — Non-Acute Institutional Stay: Payer: Medicare Other | Admitting: Nurse Practitioner

## 2015-10-16 DIAGNOSIS — N400 Enlarged prostate without lower urinary tract symptoms: Secondary | ICD-10-CM | POA: Diagnosis not present

## 2015-10-16 DIAGNOSIS — I499 Cardiac arrhythmia, unspecified: Secondary | ICD-10-CM | POA: Diagnosis not present

## 2015-10-16 DIAGNOSIS — K219 Gastro-esophageal reflux disease without esophagitis: Secondary | ICD-10-CM | POA: Diagnosis not present

## 2015-10-16 DIAGNOSIS — R946 Abnormal results of thyroid function studies: Secondary | ICD-10-CM

## 2015-10-16 DIAGNOSIS — D638 Anemia in other chronic diseases classified elsewhere: Secondary | ICD-10-CM

## 2015-10-16 DIAGNOSIS — R627 Adult failure to thrive: Secondary | ICD-10-CM

## 2015-10-16 DIAGNOSIS — R7989 Other specified abnormal findings of blood chemistry: Secondary | ICD-10-CM

## 2015-10-16 DIAGNOSIS — R609 Edema, unspecified: Secondary | ICD-10-CM | POA: Diagnosis not present

## 2015-10-16 NOTE — Assessment & Plan Note (Signed)
08/26/15 TSH 4.91, monitor TSH

## 2015-10-16 NOTE — Assessment & Plan Note (Signed)
08/26/15 TP 5.4, albumin 3.0

## 2015-10-16 NOTE — Assessment & Plan Note (Signed)
08/26/15 Hgb 10.8

## 2015-10-16 NOTE — Assessment & Plan Note (Signed)
Hx of Digoxin, last EKG 07/17/15 SR, last digoxin level 1.7 09/09/15

## 2015-10-16 NOTE — Progress Notes (Signed)
Patient ID: Cody [name], male   DOB: 08/03/1924, 82 y.o.   MRN: [id number]  Location:  [facility] Room Number: [id number] Place of Service: [facility] Provider:  Marlana Latus NP  GREEN, Viviann Spare, MD  Patient Care Team: Estill Dooms, MD as PCP - General (Internal Medicine) Danella Sensing, MD as Consulting Physician (Dermatology)  Extended Emergency Contact Information Primary Emergency Contact: Grill,Bill Address: [address]          [place], [place] [address] Johnnette Litter of [place] Phone: [phone] Mobile Phone: [phone] Relation: Brother Secondary Emergency Contact: Rosaland [place] Mobile Phone: [phone] Relation: Relative  Code Status:  DNR Goals of care: Advanced Directive information Advanced Directives 10/16/2015  Does patient have an advance directive? Yes  Type of Advance Directive [place]  Does patient want to make changes to advanced directive? No - Patient declined  Copy of advanced directive(s) in chart? Yes     Chief Complaint  Patient presents with  . Medical Management of Chronic Issues    Routine Visit    HPI:  Pt is a 82 y.o. male seen today for medical management of chronic diseases.  Hx of dysrhythmia, heart rate is in control, taking Digoxin 0.125mg , edema is well managed with Dyazide 37.5/25mg  daily, no urinary retention on Avodart 0.5mg , his gait and physical strength have improved since admitted to [facility] and working with physical therapy.    Past Medical History  Diagnosis Date  . BPH (benign prostatic hyperplasia)   . Hypertension   . Diverticulosis   . Melanoma ([facility])     left forearm  . Left carotid bruit   . Osteopenia   . Hyperlipidemia   . Basal cell carcinoma     left forearm  . Inguinal hernia   . Stroke ([facility]) 3/07    affected balance  . GERD (gastroesophageal reflux disease)     occurs rarely  . Anginal pain ([facility])     nuclear stress 03/12/13 EPIC  .  Tubular adenoma of colon 2007  . Diverticulosis   . Paroxysmal atrial fibrillation ([facility])   . Balance problem 07/17/2015  . Cervical spondylosis without myelopathy 08/22/2015  . Edema 08/18/2015  . Failure to thrive in adult 07/17/2015  . Fall 07/17/2015  . Gait disturbance 08/22/2015  . History of CVA (cerebrovascular accident) 07/17/2015  . HLD (hyperlipidemia) 07/17/2015  . Loss of weight 08/22/2015  . Physical deconditioning 08/22/2015  . Cataract, nuclear 08/08/2014  . 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 Diverticulosis of colon without hemorrhage 08/22/2015  . Retinal hemorrhage 04/24/2014  . Rosacea blepharoconjunctivitis 11/20/2011  . Weakness 07/17/2015  . Hypertension    Past Surgical History  Procedure Laterality Date  . Colonoscopy  2008  . Left forearm melanoma    . Prostate biopsy    . Eye surgery Right     cataract extraction with IOL  . Inguinal hernia repair Right 03/15/2013    Procedure: HERNIA REPAIR INGUINAL ADULT;  Surgeon: Odis Hollingshead, MD;  Location: [facility];  Service: General;  Laterality: Right;  . Insertion of mesh Right 03/15/2013    Procedure: INSERTION OF MESH;  Surgeon: Odis Hollingshead, MD;  Location: [facility];  Service: General;  Laterality: Right;  . Tonsillectomy  1933    Allergies  Allergen Reactions  . Aspirin   . Eggs Or Egg-Derived Products Swelling    throat      Medication List  This list is accurate as of: 10/16/15  1:35 PM.  Always use your most recent med list.               cholecalciferol 400 units Tabs tablet  Commonly known as:  VITAMIN D  Take 400 Units by mouth 2 (two) times daily.     clopidogrel 75 MG tablet  Commonly known as:  PLAVIX  Take 75 mg by mouth daily. Start PLAVIX from 07/24/15 if no further hematuria is reported by patient and HB > 12.     dutasteride 0.5 MG capsule  Commonly known as:  AVODART  Take 0.5 mg by mouth daily.     LANOXIN 0.125 MG tablet  Generic drug:  digoxin  Take 0.125 mg by mouth  daily.     NITROSTAT 0.4 MG SL tablet  Generic drug:  nitroGLYCERIN  Place 0.4 mg under the tongue every 5 (five) minutes as needed for chest pain.     polyethylene glycol packet  Commonly known as:  MIRALAX / GLYCOLAX  Take 17 g by mouth every other day.     pravastatin 20 MG tablet  Commonly known as:  PRAVACHOL  Take 20 mg by mouth every evening.     triamterene-hydrochlorothiazide 37.5-25 MG capsule  Commonly known as:  DYAZIDE  Take 1 capsule by mouth daily.        Review of Systems  Constitutional: Negative for fever, chills, diaphoresis, activity change, appetite change, fatigue and unexpected weight change.       History of weight loss recently had a poor appetite related sinus problems.  HENT: Positive for postnasal drip, rhinorrhea and sinus pressure. Negative for congestion, ear pain, hearing loss, sore throat, tinnitus, trouble swallowing and voice change.        Chronic sinusitis.  Eyes:       Corrective lenses. Cataracts. History of retinal bleed.  Respiratory: Negative for cough, choking, chest tightness, shortness of breath and wheezing.   Cardiovascular: Negative for chest pain, palpitations and leg swelling.       Chronict leg swelling. Recommended Ted hose but states has them at home will wear them at ALF.   Gastrointestinal: Negative for nausea, abdominal pain, diarrhea, constipation and abdominal distention.  Endocrine: Negative.  Negative for cold intolerance, heat intolerance, polydipsia, polyphagia and polyuria.  Genitourinary: Negative.  Negative for dysuria, urgency, frequency and testicular pain.       Not incontinent  Musculoskeletal: Positive for gait problem. Negative for myalgias, back pain, joint swelling, arthralgias and neck pain.  Skin: Negative for color change, pallor and rash.  Allergic/Immunologic: Negative.   Neurological: Positive for weakness. Negative for dizziness, tremors, seizures, syncope, speech difficulty, light-headedness,  numbness and headaches.       Weakness of the legs. Poor balance. History of cerebral atrophy. History of microvascular disease of the brain. History of prior CVA in 2007.  Hematological: Negative for adenopathy. Does not bruise/bleed easily.  Psychiatric/Behavioral: Negative for hallucinations, behavioral problems, confusion, sleep disturbance and decreased concentration. The patient is not nervous/anxious.     Immunization History  Administered Date(s) Administered  . PPD Test 07/21/2015, 08/04/2015  . Tdap 07/16/2015   Pertinent  Health Maintenance Due  Topic Date Due  . INFLUENZA VACCINE  08/25/2016 (Originally 01/27/2016)  . PNA vac Low Risk Adult (1 of 2 - PCV13) 08/25/2016 (Originally 06/19/1992)   No flowsheet data found. Functional Status Survey:    There were no vitals filed for this visit. There is no weight on  file to calculate BMI. Physical Exam  Constitutional: He is oriented to person, place, and time. He appears well-developed.  Frail Elderly in no acute distress   HENT:  Head: Normocephalic.  Right Ear: External ear normal.  Left Ear: External ear normal.  Mouth/Throat: Oropharynx is clear and moist.  Eyes: Conjunctivae and EOM are normal. Pupils are equal, round, and reactive to light. Right eye exhibits no discharge. No scleral icterus.  Neck: Normal range of motion. No JVD present. No tracheal deviation present. No thyromegaly present.  Cardiovascular: Normal rate, regular rhythm, normal heart sounds and intact distal pulses.  Exam reveals no gallop and no friction rub.   No murmur heard. Pulmonary/Chest: Effort normal and breath sounds normal. No respiratory distress. He has no wheezes. He has no rales.  Abdominal: Soft. Bowel sounds are normal. He exhibits no distension and no mass. There is no tenderness. There is no rebound and no guarding.  Prior herniorrhaphy scar  Musculoskeletal: Normal range of motion. He exhibits no tenderness. Edema: edema of both  lower legs and feet. Worse on the right.  Weakness of both lower extremities.  Lymphadenopathy:    He has no cervical adenopathy.  Neurological: He is oriented to person, place, and time.  Skin: Skin is warm. No rash noted. No erythema. No pallor.  Psychiatric: He has a normal mood and affect. His behavior is normal. Judgment and thought content normal.    Labs reviewed:  Recent Labs  07/18/15 0418 07/20/15 0835 07/21/15 0511 07/23/15 08/26/15  NA 139 140 139 139 139  K 3.6 4.0 3.9 4.1 4.1  CL 103 105 103  --   --   CO2 27 30 31   --   --   GLUCOSE 75 103* 98  --   --   BUN 41* 28* 20 20 30*  CREATININE 1.66* 1.00 0.91 0.9 0.9  CALCIUM 8.6* 8.8* 8.7*  --   --     Recent Labs  07/23/15 08/26/15  AST 21 11*  ALT 16 7*  ALKPHOS 59 57    Recent Labs  07/16/15 2239 07/17/15 0327 07/23/15 08/26/15  WBC 9.5 7.6 4.9 3.6  NEUTROABS 8.0*  --   --   --   HGB 13.6 12.7* 12.8* 10.8*  HCT 39.0 37.6* 40* 33*  MCV 90.5 90.2  --   --   PLT 205 189 165 227   Lab Results  Component Value Date   TSH 4.91 08/26/2015   No results found for: HGBA1C Lab Results  Component Value Date   CHOL 113 08/26/2015   HDL 48 08/26/2015   LDLCALC 53 08/26/2015   TRIG 61 08/26/2015    Significant Diagnostic Results in last 30 days:  No results found.  Assessment/Plan  Anemia of chronic disease 08/26/15 Hgb 10.8   BPH (benign prostatic hyperplasia) Stable, continue Avodart 0.5mg  daily.   Edema Well controlled, taking Dyazide 37.5/25mg  daily, 08/26/15 Na 139, K 4.1, Bun 30, creat 0.9  Elevated TSH 08/26/15 TSH 4.91, monitor TSH   Failure to thrive in adult 08/26/15 TP 5.4, albumin 3.0   GERD (gastroesophageal reflux disease) stable  Irregular heart beats Hx of Digoxin, last EKG 07/17/15 SR, last digoxin level 1.7 09/09/15     Family/ staff Communication: continue [facility] for care needs.   Labs/tests ordered:  none

## 2015-10-16 NOTE — Assessment & Plan Note (Signed)
Stable, continue Avodart 0.5mg  daily.

## 2015-10-16 NOTE — Assessment & Plan Note (Signed)
Well controlled, taking Dyazide 37.5/25mg  daily, 08/26/15 Na 139, K 4.1, Bun 30, creat 0.9

## 2015-10-16 NOTE — Assessment & Plan Note (Signed)
stable °

## 2015-10-30 DIAGNOSIS — Z029 Encounter for administrative examinations, unspecified: Secondary | ICD-10-CM

## 2015-11-06 ENCOUNTER — Non-Acute Institutional Stay: Payer: Medicare Other | Admitting: Internal Medicine

## 2015-11-06 ENCOUNTER — Encounter: Payer: Self-pay | Admitting: Internal Medicine

## 2015-11-06 VITALS — BP 140/64 | HR 76 | Temp 97.7°F | Ht 73.0 in | Wt 141.0 lb

## 2015-11-06 DIAGNOSIS — R609 Edema, unspecified: Secondary | ICD-10-CM | POA: Diagnosis not present

## 2015-11-06 DIAGNOSIS — R269 Unspecified abnormalities of gait and mobility: Secondary | ICD-10-CM

## 2015-11-06 DIAGNOSIS — E785 Hyperlipidemia, unspecified: Secondary | ICD-10-CM | POA: Diagnosis not present

## 2015-11-06 DIAGNOSIS — N400 Enlarged prostate without lower urinary tract symptoms: Secondary | ICD-10-CM

## 2015-11-06 DIAGNOSIS — I499 Cardiac arrhythmia, unspecified: Secondary | ICD-10-CM | POA: Diagnosis not present

## 2015-11-06 DIAGNOSIS — K219 Gastro-esophageal reflux disease without esophagitis: Secondary | ICD-10-CM | POA: Diagnosis not present

## 2015-11-06 DIAGNOSIS — D638 Anemia in other chronic diseases classified elsewhere: Secondary | ICD-10-CM | POA: Diagnosis not present

## 2015-11-06 NOTE — Progress Notes (Signed)
Patient ID: Cody [name], male   DOB: January 25, 1925, 82 y.o.   MRN: [id number]    [facility] Room Number: 638 [facility]  Place of Service: Clinic (12)     Allergies  Allergen Reactions  . Aspirin   . Eggs Or Egg-Derived Products Swelling    throat    HPI:   Patient admitted 08/21/2015. Has adapted well to the assisted living area. Patient has gained in strength and has gained weight since admission.  Admitting lab done 08/26/2015 showed a few abnormalities. Hemoglobin was slightly low at 10.8 g percent. BUN was 30 and TSH was 4.91. The remainder of the lab which included a creatine kinase lipid panel comprehensive metabolic panel and CBC was within normal limits.  Anemia of chronic disease - hemoglobin 10.8  BPH (benign prostatic hyperplasia) - patient says he is voiding a little more frequently. He is not sure that the generic Avodart that he is taking is equivalent to the previous branded product.  HLD (hyperlipidemia) - well controlled  Gait disturbance - continues to be at high risk for falls due to his balance problems. Riding wheelchair today.  Edema, unspecified type - 1+ bilateral lower leg and foot edema. Patient is taking triamterene/HCTZ. He is aware that this may affect his frequency of urination, but does not want to go off of this medication.  Gastroesophageal reflux disease without esophagitis - asymptomatic at this time  Irregular heart beats - history of paroxysmal atrial fibrillation. Current rhythm is normal with PVCs.    Medications: Patient's Medications  New Prescriptions   No medications on file  Previous Medications   CHOLECALCIFEROL (VITAMIN D) 400 UNITS TABS TABLET    Take 400 Units by mouth 2 (two) times daily.   CLOPIDOGREL (PLAVIX) 75 MG TABLET    Take 75 mg by mouth daily. Start PLAVIX from 07/24/15 if no further hematuria is reported by patient and HB > 12.   DIGOXIN (LANOXIN) 0.125 MG TABLET    Take 0.125 mg by mouth  daily.   DUTASTERIDE (AVODART) 0.5 MG CAPSULE    Take 0.5 mg by mouth daily.    NITROSTAT 0.4 MG SL TABLET    Place 0.4 mg under the tongue every 5 (five) minutes as needed for chest pain.    POLYETHYLENE GLYCOL (MIRALAX / GLYCOLAX) PACKET    Take 17 g by mouth every other day.   PRAVASTATIN (PRAVACHOL) 20 MG TABLET    Take 20 mg by mouth every evening.    TRIAMTERENE-HYDROCHLOROTHIAZIDE (DYAZIDE) 37.5-25 MG CAPSULE    Take 1 capsule by mouth daily.  Modified Medications   No medications on file  Discontinued Medications   No medications on file     Review of Systems  Constitutional: Negative for fever, chills, diaphoresis, activity change, appetite change, fatigue and unexpected weight change.       History of weight loss recently had a poor appetite related sinus problems.  HENT: Positive for postnasal drip, rhinorrhea and sinus pressure. Negative for congestion, ear pain, hearing loss, sore throat, tinnitus, trouble swallowing and voice change.        Chronic sinusitis.  Eyes:       Corrective lenses. Cataracts. History of retinal bleed.  Respiratory: Negative for cough, choking, chest tightness, shortness of breath and wheezing.   Cardiovascular: Positive for leg swelling. Negative for chest pain and palpitations.       Chronict leg swelling. Recommended Ted hose but states has them at home will  wear them at ALF.  History of paroxysmal atrial fibrillation and PVCs.  Gastrointestinal: Negative for nausea, abdominal pain, diarrhea, constipation and abdominal distention.  Endocrine: Negative.  Negative for cold intolerance, heat intolerance, polydipsia, polyphagia and polyuria.  Genitourinary: Negative.  Negative for dysuria, urgency, frequency and testicular pain.       Not incontinent  Musculoskeletal: Positive for gait problem. Negative for myalgias, back pain, joint swelling, arthralgias and neck pain.  Skin: Negative for color change, pallor and rash.  Allergic/Immunologic:  Negative.   Neurological: Positive for weakness. Negative for dizziness, tremors, seizures, syncope, speech difficulty, light-headedness, numbness and headaches.       Weakness of the legs. Poor balance. History of cerebral atrophy. History of microvascular disease of the brain. History of prior CVA in 2007.  Hematological: Negative for adenopathy. Does not bruise/bleed easily.  Psychiatric/Behavioral: Negative for hallucinations, behavioral problems, confusion, sleep disturbance and decreased concentration. The patient is not nervous/anxious.     Filed Vitals:   11/06/15 1532  BP: 140/64  Pulse: 76  Temp: 97.7 F (36.5 C)  TempSrc: Oral  Height: '6\' 1"'  (1.854 m)  Weight: 141 lb (63.957 kg)  SpO2: 94%   Wt Readings from Last 3 Encounters:  11/06/15 141 lb (63.957 kg)  08/22/15 133 lb (60.328 kg)  08/18/15 124 lb 12.8 oz (56.609 kg)    Body mass index is 18.61 kg/(m^2).  Physical Exam  Constitutional: He is oriented to person, place, and time. He appears well-developed.  Frail Elderly in no acute distress   HENT:  Head: Normocephalic.  Right Ear: External ear normal.  Left Ear: External ear normal.  Mouth/Throat: Oropharynx is clear and moist.  Eyes: Conjunctivae and EOM are normal. Pupils are equal, round, and reactive to light. Right eye exhibits no discharge. No scleral icterus.  Neck: Normal range of motion. No JVD present. No tracheal deviation present. No thyromegaly present.  Cardiovascular: Normal rate, regular rhythm, normal heart sounds and intact distal pulses.  Exam reveals no gallop and no friction rub.   No murmur heard. Multiple PVCs noted  Pulmonary/Chest: Effort normal and breath sounds normal. No respiratory distress. He has no wheezes. He has no rales.  Abdominal: Soft. Bowel sounds are normal. He exhibits no distension and no mass. There is no tenderness. There is no rebound and no guarding.  Prior herniorrhaphy scar  Musculoskeletal: Normal range of  motion. He exhibits edema (edema of both lower legs and feet. Worse on the right.). He exhibits no tenderness.  Weakness of both lower extremities.  Lymphadenopathy:    He has no cervical adenopathy.  Neurological: He is oriented to person, place, and time.  Skin: Skin is warm. No rash noted. No erythema. No pallor.  Psychiatric: He has a normal mood and affect. His behavior is normal. Judgment and thought content normal.     Labs reviewed: Lab Summary Latest Ref Rng 08/26/2015 07/23/2015 07/21/2015 07/20/2015 07/18/2015  Hemoglobin 13.5 - 17.5 g/dL 10.8(A) 12.8(A) (None) (None) (None)  Hematocrit 41 - 53 % 33(A) 40(A) (None) (None) (None)  White count - 3.6 4.9 (None) (None) (None)  Platelet count 150 - 399 K/L 227 165 (None) (None) (None)  Sodium 137 - 147 mmol/L 139 139 139 140 139  Potassium 3.4 - 5.3 mmol/L 4.1 4.1 3.9 4.0 3.6  Calcium 8.9 - 10.3 mg/dL (None) (None) 8.7(L) 8.8(L) 8.6(L)  Phosphorus - (None) (None) (None) (None) (None)  Creatinine 0.6 - 1.3 mg/dL 0.9 0.9 0.91 1.00 1.66(H)  AST 14 -  40 U/L 11(A) 21 (None) (None) (None)  Alk Phos 25 - 125 U/L 57 59 (None) (None) (None)  Bilirubin - (None) (None) (None) (None) (None)  Glucose - 88 89 98 103(H) 75  Cholesterol 0 - 200 mg/dL 113 (None) (None) (None) (None)  HDL cholesterol 35 - 70 mg/dL 48 (None) (None) (None) (None)  Triglycerides 40 - 160 mg/dL 61 (None) (None) (None) (None)  LDL Direct - (None) (None) (None) (None) (None)  LDL Calc - 53 (None) (None) (None) (None)  Total protein - (None) (None) (None) (None) (None)  Albumin - (None) (None) (None) (None) (None)   Lab Results  Component Value Date   TSH 4.91 08/26/2015   Lab Results  Component Value Date   BUN 30* 08/26/2015   BUN 20 07/23/2015   BUN 20 07/21/2015   Lab Results  Component Value Date   CREATININE 0.9 08/26/2015   CREATININE 0.9 07/23/2015   CREATININE 0.91 07/21/2015   No results found for: HGBA1C     Assessment/Plan  1. Anemia of  chronic disease -Follow-up CBC next visit  2. BPH (benign prostatic hyperplasia) - Continue current medication. Advised him that discontinuation of triamterene/HCTZ  3. HLD (hyperlipidemia) -Continue current medication  4. Gait disturbance Remains at risk for falls, but is gaining strength with therapy.  5. Edema, unspecified type Discussed compression stockings, but he doesn't want to wear them because he finds difficult to wear.  6. Gastroesophageal reflux disease without esophagitis Asymptomatic  7. Irregular heart beats PVCs

## 2015-12-09 DIAGNOSIS — M216X1 Other acquired deformities of right foot: Secondary | ICD-10-CM | POA: Diagnosis not present

## 2015-12-09 DIAGNOSIS — L602 Onychogryphosis: Secondary | ICD-10-CM | POA: Diagnosis not present

## 2015-12-09 DIAGNOSIS — L84 Corns and callosities: Secondary | ICD-10-CM | POA: Diagnosis not present

## 2015-12-18 DIAGNOSIS — Z79899 Other long term (current) drug therapy: Secondary | ICD-10-CM | POA: Diagnosis not present

## 2016-01-21 ENCOUNTER — Encounter: Payer: Self-pay | Admitting: Nurse Practitioner

## 2016-01-21 ENCOUNTER — Non-Acute Institutional Stay: Payer: Medicare Other | Admitting: Nurse Practitioner

## 2016-01-21 DIAGNOSIS — R609 Edema, unspecified: Secondary | ICD-10-CM | POA: Diagnosis not present

## 2016-01-21 DIAGNOSIS — R946 Abnormal results of thyroid function studies: Secondary | ICD-10-CM | POA: Diagnosis not present

## 2016-01-21 DIAGNOSIS — R7989 Other specified abnormal findings of blood chemistry: Secondary | ICD-10-CM

## 2016-01-21 DIAGNOSIS — N400 Enlarged prostate without lower urinary tract symptoms: Secondary | ICD-10-CM

## 2016-01-21 DIAGNOSIS — D638 Anemia in other chronic diseases classified elsewhere: Secondary | ICD-10-CM | POA: Diagnosis not present

## 2016-01-21 DIAGNOSIS — R269 Unspecified abnormalities of gait and mobility: Secondary | ICD-10-CM

## 2016-01-21 DIAGNOSIS — E43 Unspecified severe protein-calorie malnutrition: Secondary | ICD-10-CM | POA: Diagnosis not present

## 2016-01-21 DIAGNOSIS — K219 Gastro-esophageal reflux disease without esophagitis: Secondary | ICD-10-CM

## 2016-01-21 NOTE — Assessment & Plan Note (Signed)
stable °

## 2016-01-21 NOTE — Assessment & Plan Note (Signed)
Well controlled, taking Dyazide 37.5/25mg  daily, 08/26/15 Na 139, K 4.1, Bun 30, creat 0.9 Update CMP

## 2016-01-21 NOTE — Assessment & Plan Note (Addendum)
08/26/15 Hgb 10.8, update CBC

## 2016-01-21 NOTE — Assessment & Plan Note (Signed)
Continue therapy, goal is to return [facility] when able.

## 2016-01-21 NOTE — Assessment & Plan Note (Signed)
Stable, continue Avodart 0.5mg  daily.

## 2016-01-21 NOTE — Assessment & Plan Note (Addendum)
08/26/15 TP 5.4, albumin 3.0, update CMP, CBC

## 2016-01-21 NOTE — Assessment & Plan Note (Signed)
08/26/15 TSH 4.91, update TSH

## 2016-01-21 NOTE — Progress Notes (Signed)
Location:   [facility] Room Number: 813-RCB Place of Service:  SNF (31) Provider: Westlee Devita  NP  Jeanmarie Hubert, MD  Patient Care Team: Estill Dooms, MD as PCP - General (Internal Medicine) Danella Sensing, MD as Consulting Physician (Dermatology) Keedan Sample Otho Darner, NP as Nurse Practitioner (Internal Medicine)  Extended Emergency Contact Information Primary Emergency Contact: [place],Bill Address: [address]          [place], [place] [address] Johnnette Litter of [place] Phone: [phone] Mobile Phone: [phone] Relation: Brother Secondary Emergency Contact: [place] Mobile Phone: [phone] Relation: Relative  Code Status: Full Code Goals of care: Advanced Directive information Advanced Directives 01/21/2016  Does patient have an advance directive? Yes  Type of Advance Directive Pass Christian  Does patient want to make changes to advanced directive? No - Patient declined  Copy of advanced directive(s) in chart? Yes  Pre-existing out of facility DNR order (yellow form or pink MOST form) -     Chief Complaint  Patient presents with  . Medical Management of Chronic Issues    HPI:  Pt is a 82 y.o. male seen today for medical management of chronic diseases.      Hx of dysrhythmia, heart rate is in control, taking Digoxin 0.125mg , edema is well managed with Dyazide 37.5/25mg  daily, no urinary retention on Avodart 0.5mg , his gait and physical strength have improved since admitted to AL and working with physical therapy.     Past Medical History:  Diagnosis Date  . Anginal pain ([facility])    nuclear stress 03/12/13 EPIC  . Balance problem 07/17/2015  . Basal cell carcinoma    left forearm  . BPH (benign prostatic hyperplasia)   . Cataract, nuclear 08/08/2014  . 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 Cervical spondylosis without myelopathy 08/22/2015  . Diverticulosis   . Diverticulosis   . Diverticulosis  of colon without hemorrhage 08/22/2015  . Edema 08/18/2015  . Failure to thrive in adult 07/17/2015  . Fall 07/17/2015  . Gait disturbance 08/22/2015  . GERD (gastroesophageal reflux disease)    occurs rarely  . History of CVA (cerebrovascular accident) 07/17/2015  . HLD (hyperlipidemia) 07/17/2015  . Hyperlipidemia   . Hypertension   . Hypertension   . Inguinal hernia   . Left carotid bruit   . Loss of weight 08/22/2015  . Melanoma ([facility])    left forearm  . Osteopenia   . Paroxysmal atrial fibrillation ([facility])   . Physical deconditioning 08/22/2015  . Retinal hemorrhage 04/24/2014  . Rosacea blepharoconjunctivitis 11/20/2011  . Stroke ([facility]) 3/07   affected balance  . Tubular adenoma of colon 2007  . Weakness 07/17/2015   Past Surgical History:  Procedure Laterality Date  . COLONOSCOPY  2008  . EYE SURGERY Right    cataract extraction with IOL  . INGUINAL HERNIA REPAIR Right 03/15/2013   Procedure: HERNIA REPAIR INGUINAL ADULT;  Surgeon: Odis Hollingshead, MD;  Location: [facility];  Service: General;  Laterality: Right;  . INSERTION OF MESH Right 03/15/2013   Procedure: INSERTION OF MESH;  Surgeon: Odis Hollingshead, MD;  Location: [facility];  Service: General;  Laterality: Right;  . left forearm melanoma    . PROSTATE BIOPSY    . TONSILLECTOMY  1933    Allergies  Allergen Reactions  . Aspirin   . Eggs Or Egg-Derived Products Swelling    throat      Medication List  Accurate as of 01/21/16  5:03 PM. Always use your most recent med list.          cholecalciferol 400 units Tabs tablet Commonly known as:  VITAMIN D Take 400 Units by mouth 2 (two) times daily.   clopidogrel 75 MG tablet Commonly known as:  PLAVIX Take 75 mg by mouth daily. Start PLAVIX from 07/24/15 if no further hematuria is reported by patient and HB > 12.   dutasteride 0.5 MG capsule Commonly known as:  AVODART Take 0.5 mg by mouth daily.   LANOXIN 0.125 MG tablet Generic drug:  digoxin Take 0.125  mg by mouth daily.   NITROSTAT 0.4 MG SL tablet Generic drug:  nitroGLYCERIN Place 0.4 mg under the tongue every 5 (five) minutes as needed for chest pain.   polyethylene glycol packet Commonly known as:  MIRALAX / GLYCOLAX Take 17 g by mouth every other day.   pravastatin 20 MG tablet Commonly known as:  PRAVACHOL Take 20 mg by mouth every evening.   triamterene-hydrochlorothiazide 37.5-25 MG capsule Commonly known as:  DYAZIDE Take 1 capsule by mouth daily.       Review of Systems  Constitutional: Negative for activity change, appetite change, chills, diaphoresis, fatigue, fever and unexpected weight change.       History of weight loss recently had a poor appetite related sinus problems.  HENT: Positive for postnasal drip, rhinorrhea and sinus pressure. Negative for congestion, ear pain, hearing loss, sore throat, tinnitus, trouble swallowing and voice change.        Chronic sinusitis.  Eyes:       Corrective lenses. Cataracts. History of retinal bleed.  Respiratory: Negative for cough, choking, chest tightness, shortness of breath and wheezing.   Cardiovascular: Positive for leg swelling. Negative for chest pain and palpitations.       Chronict leg swelling. Recommended Ted hose but states has them at home will wear them at ALF.  History of paroxysmal atrial fibrillation and PVCs.  Gastrointestinal: Negative for abdominal distention, abdominal pain, constipation, diarrhea and nausea.  Endocrine: Negative.  Negative for cold intolerance, heat intolerance, polydipsia, polyphagia and polyuria.  Genitourinary: Negative.  Negative for dysuria, frequency, testicular pain and urgency.       Not incontinent  Musculoskeletal: Positive for gait problem. Negative for arthralgias, back pain, joint swelling, myalgias and neck pain.  Skin: Negative for color change, pallor and rash.  Allergic/Immunologic: Negative.   Neurological: Positive for weakness. Negative for dizziness, tremors,  seizures, syncope, speech difficulty, light-headedness, numbness and headaches.       Weakness of the legs. Poor balance. History of cerebral atrophy. History of microvascular disease of the brain. History of prior CVA in 2007.  Hematological: Negative for adenopathy. Does not bruise/bleed easily.  Psychiatric/Behavioral: Negative for behavioral problems, confusion, decreased concentration, hallucinations and sleep disturbance. The patient is not nervous/anxious.     Immunization History  Administered Date(s) Administered  . PPD Test 07/21/2015, 08/04/2015  . Tdap 07/16/2015   Pertinent  Health Maintenance Due  Topic Date Due  . INFLUENZA VACCINE  08/25/2016 (Originally 01/27/2016)  . PNA vac Low Risk Adult (1 of 2 - PCV13) 08/25/2016 (Originally 06/19/1992)   Fall Risk  11/06/2015  Falls in the past year? Yes  Number falls in past yr: 1  Injury with Fall? Yes   Functional Status Survey:    Vitals:   01/21/16 1133  BP: 120/60  Pulse: 70  Resp: 18  Temp: 97 F (36.1 C)  Weight: 143  lb 11.2 oz (65.2 kg)  Height: 6\' 1"  (1.854 m)   Body mass index is 18.96 kg/m. Physical Exam  Constitutional: He is oriented to person, place, and time. He appears well-developed.  Frail Elderly in no acute distress   HENT:  Head: Normocephalic.  Right Ear: External ear normal.  Left Ear: External ear normal.  Mouth/Throat: Oropharynx is clear and moist.  Eyes: Conjunctivae and EOM are normal. Pupils are equal, round, and reactive to light. Right eye exhibits no discharge. No scleral icterus.  Neck: Normal range of motion. No JVD present. No tracheal deviation present. No thyromegaly present.  Cardiovascular: Normal rate, regular rhythm, normal heart sounds and intact distal pulses.  Exam reveals no gallop and no friction rub.   No murmur heard. Multiple PVCs noted  Pulmonary/Chest: Effort normal and breath sounds normal. No respiratory distress. He has no wheezes. He has no rales.  Abdominal:  Soft. Bowel sounds are normal. He exhibits no distension and no mass. There is no tenderness. There is no rebound and no guarding.  Prior herniorrhaphy scar  Musculoskeletal: Normal range of motion. He exhibits edema (edema of both lower legs and feet. Worse on the right.). He exhibits no tenderness.  Weakness of both lower extremities.  Lymphadenopathy:    He has no cervical adenopathy.  Neurological: He is oriented to person, place, and time.  Skin: Skin is warm. No rash noted. No erythema. No pallor.  Psychiatric: He has a normal mood and affect. His behavior is normal. Judgment and thought content normal.    Labs reviewed:  Recent Labs  07/18/15 0418 07/20/15 0835 07/21/15 0511 07/23/15 08/26/15  NA 139 140 139 139 139  K 3.6 4.0 3.9 4.1 4.1  CL 103 105 103  --   --   CO2 27 30 31   --   --   GLUCOSE 75 103* 98  --   --   BUN 41* 28* 20 20 30*  CREATININE 1.66* 1.00 0.91 0.9 0.9  CALCIUM 8.6* 8.8* 8.7*  --   --     Recent Labs  07/23/15 08/26/15  AST 21 11*  ALT 16 7*  ALKPHOS 59 57    Recent Labs  07/16/15 2239 07/17/15 0327 07/23/15 08/26/15  WBC 9.5 7.6 4.9 3.6  NEUTROABS 8.0*  --   --   --   HGB 13.6 12.7* 12.8* 10.8*  HCT 39.0 37.6* 40* 33*  MCV 90.5 90.2  --   --   PLT 205 189 165 227   Lab Results  Component Value Date   TSH 4.91 08/26/2015   No results found for: HGBA1C Lab Results  Component Value Date   CHOL 113 08/26/2015   HDL 48 08/26/2015   LDLCALC 53 08/26/2015   TRIG 61 08/26/2015    Significant Diagnostic Results in last 30 days:  No results found.  Assessment/Plan GERD (gastroesophageal reflux disease) stable   BPH (benign prostatic hyperplasia) Stable, continue Avodart 0.5mg  daily.    Gait disturbance Continue therapy, goal is to return IL when able.   Anemia of chronic disease 08/26/15 Hgb 10.8, update CBC  Elevated TSH 08/26/15 TSH 4.91, update TSH    Protein-calorie malnutrition, severe 08/26/15 TP 5.4, albumin  3.0, update CMP, CBC  Edema Well controlled, taking Dyazide 37.5/25mg  daily, 08/26/15 Na 139, K 4.1, Bun 30, creat 0.9 Update CMP    Family/ staff Communication:  IL when able   Labs/tests ordered: CBC, CMP, TSH

## 2016-01-22 DIAGNOSIS — I10 Essential (primary) hypertension: Secondary | ICD-10-CM | POA: Diagnosis not present

## 2016-01-22 DIAGNOSIS — N390 Urinary tract infection, site not specified: Secondary | ICD-10-CM | POA: Diagnosis not present

## 2016-01-22 LAB — HEPATIC FUNCTION PANEL
ALT: 8 U/L — AB (ref 10–40)
AST: 13 U/L — AB (ref 14–40)
Alkaline Phosphatase: 57 U/L (ref 25–125)
BILIRUBIN, TOTAL: 0.6 mg/dL

## 2016-01-22 LAB — BASIC METABOLIC PANEL
Creatinine: 0.8 mg/dL (ref <=1.3)
Glucose: 80 mg/dL
Potassium: 3.9 mmol/L (ref 3.4–5.3)
Sodium: 137 mmol/L (ref 137–147)

## 2016-01-22 LAB — TSH: TSH: 5.12 u[IU]/mL (ref <=5.90)

## 2016-01-22 LAB — CBC AND DIFFERENTIAL
HEMOGLOBIN: 13 g/dL — AB (ref 13.5–17.5)
PLATELETS: 209 10*3/uL (ref 150–399)
WBC: 5.4 10*3/mL

## 2016-01-26 DIAGNOSIS — R296 Repeated falls: Secondary | ICD-10-CM | POA: Diagnosis not present

## 2016-01-26 DIAGNOSIS — R41841 Cognitive communication deficit: Secondary | ICD-10-CM | POA: Diagnosis not present

## 2016-01-26 DIAGNOSIS — L218 Other seborrheic dermatitis: Secondary | ICD-10-CM | POA: Diagnosis not present

## 2016-01-26 DIAGNOSIS — M8580 Other specified disorders of bone density and structure, unspecified site: Secondary | ICD-10-CM | POA: Diagnosis not present

## 2016-01-26 DIAGNOSIS — R1312 Dysphagia, oropharyngeal phase: Secondary | ICD-10-CM | POA: Diagnosis not present

## 2016-01-26 DIAGNOSIS — Z9181 History of falling: Secondary | ICD-10-CM | POA: Diagnosis not present

## 2016-01-26 DIAGNOSIS — I10 Essential (primary) hypertension: Secondary | ICD-10-CM | POA: Diagnosis not present

## 2016-01-26 DIAGNOSIS — R2681 Unsteadiness on feet: Secondary | ICD-10-CM | POA: Diagnosis not present

## 2016-01-26 DIAGNOSIS — R011 Cardiac murmur, unspecified: Secondary | ICD-10-CM | POA: Diagnosis not present

## 2016-01-26 DIAGNOSIS — R29898 Other symptoms and signs involving the musculoskeletal system: Secondary | ICD-10-CM | POA: Diagnosis not present

## 2016-01-26 DIAGNOSIS — D692 Other nonthrombocytopenic purpura: Secondary | ICD-10-CM | POA: Diagnosis not present

## 2016-01-26 DIAGNOSIS — M47892 Other spondylosis, cervical region: Secondary | ICD-10-CM | POA: Diagnosis not present

## 2016-01-26 DIAGNOSIS — K1379 Other lesions of oral mucosa: Secondary | ICD-10-CM | POA: Diagnosis not present

## 2016-01-26 DIAGNOSIS — L570 Actinic keratosis: Secondary | ICD-10-CM | POA: Diagnosis not present

## 2016-01-26 DIAGNOSIS — M6281 Muscle weakness (generalized): Secondary | ICD-10-CM | POA: Diagnosis not present

## 2016-01-26 DIAGNOSIS — L821 Other seborrheic keratosis: Secondary | ICD-10-CM | POA: Diagnosis not present

## 2016-01-26 DIAGNOSIS — Z85828 Personal history of other malignant neoplasm of skin: Secondary | ICD-10-CM | POA: Diagnosis not present

## 2016-01-26 DIAGNOSIS — Z85820 Personal history of malignant melanoma of skin: Secondary | ICD-10-CM | POA: Diagnosis not present

## 2016-01-28 ENCOUNTER — Other Ambulatory Visit: Payer: Self-pay | Admitting: *Deleted

## 2016-02-03 DIAGNOSIS — I10 Essential (primary) hypertension: Secondary | ICD-10-CM | POA: Diagnosis not present

## 2016-02-03 DIAGNOSIS — M47892 Other spondylosis, cervical region: Secondary | ICD-10-CM | POA: Diagnosis not present

## 2016-02-03 DIAGNOSIS — M6281 Muscle weakness (generalized): Secondary | ICD-10-CM | POA: Diagnosis not present

## 2016-02-03 DIAGNOSIS — R011 Cardiac murmur, unspecified: Secondary | ICD-10-CM | POA: Diagnosis not present

## 2016-02-03 DIAGNOSIS — K1379 Other lesions of oral mucosa: Secondary | ICD-10-CM | POA: Diagnosis not present

## 2016-02-03 DIAGNOSIS — Z9181 History of falling: Secondary | ICD-10-CM | POA: Diagnosis not present

## 2016-02-03 DIAGNOSIS — R29898 Other symptoms and signs involving the musculoskeletal system: Secondary | ICD-10-CM | POA: Diagnosis not present

## 2016-02-03 DIAGNOSIS — R296 Repeated falls: Secondary | ICD-10-CM | POA: Diagnosis not present

## 2016-02-03 DIAGNOSIS — R1312 Dysphagia, oropharyngeal phase: Secondary | ICD-10-CM | POA: Diagnosis not present

## 2016-02-03 DIAGNOSIS — R278 Other lack of coordination: Secondary | ICD-10-CM | POA: Diagnosis not present

## 2016-02-03 DIAGNOSIS — R41841 Cognitive communication deficit: Secondary | ICD-10-CM | POA: Diagnosis not present

## 2016-02-03 DIAGNOSIS — M8580 Other specified disorders of bone density and structure, unspecified site: Secondary | ICD-10-CM | POA: Diagnosis not present

## 2016-02-05 DIAGNOSIS — Z9181 History of falling: Secondary | ICD-10-CM | POA: Diagnosis not present

## 2016-02-05 DIAGNOSIS — M6281 Muscle weakness (generalized): Secondary | ICD-10-CM | POA: Diagnosis not present

## 2016-02-05 DIAGNOSIS — R278 Other lack of coordination: Secondary | ICD-10-CM | POA: Diagnosis not present

## 2016-02-05 DIAGNOSIS — R29898 Other symptoms and signs involving the musculoskeletal system: Secondary | ICD-10-CM | POA: Diagnosis not present

## 2016-02-05 DIAGNOSIS — R41841 Cognitive communication deficit: Secondary | ICD-10-CM | POA: Diagnosis not present

## 2016-02-05 DIAGNOSIS — M47892 Other spondylosis, cervical region: Secondary | ICD-10-CM | POA: Diagnosis not present

## 2016-02-06 DIAGNOSIS — M6281 Muscle weakness (generalized): Secondary | ICD-10-CM | POA: Diagnosis not present

## 2016-02-06 DIAGNOSIS — R41841 Cognitive communication deficit: Secondary | ICD-10-CM | POA: Diagnosis not present

## 2016-02-06 DIAGNOSIS — R29898 Other symptoms and signs involving the musculoskeletal system: Secondary | ICD-10-CM | POA: Diagnosis not present

## 2016-02-06 DIAGNOSIS — M47892 Other spondylosis, cervical region: Secondary | ICD-10-CM | POA: Diagnosis not present

## 2016-02-06 DIAGNOSIS — R278 Other lack of coordination: Secondary | ICD-10-CM | POA: Diagnosis not present

## 2016-02-06 DIAGNOSIS — Z9181 History of falling: Secondary | ICD-10-CM | POA: Diagnosis not present

## 2016-02-09 DIAGNOSIS — R29898 Other symptoms and signs involving the musculoskeletal system: Secondary | ICD-10-CM | POA: Diagnosis not present

## 2016-02-09 DIAGNOSIS — R278 Other lack of coordination: Secondary | ICD-10-CM | POA: Diagnosis not present

## 2016-02-09 DIAGNOSIS — R41841 Cognitive communication deficit: Secondary | ICD-10-CM | POA: Diagnosis not present

## 2016-02-09 DIAGNOSIS — M6281 Muscle weakness (generalized): Secondary | ICD-10-CM | POA: Diagnosis not present

## 2016-02-09 DIAGNOSIS — Z9181 History of falling: Secondary | ICD-10-CM | POA: Diagnosis not present

## 2016-02-09 DIAGNOSIS — M47892 Other spondylosis, cervical region: Secondary | ICD-10-CM | POA: Diagnosis not present

## 2016-02-10 DIAGNOSIS — R41841 Cognitive communication deficit: Secondary | ICD-10-CM | POA: Diagnosis not present

## 2016-02-10 DIAGNOSIS — R29898 Other symptoms and signs involving the musculoskeletal system: Secondary | ICD-10-CM | POA: Diagnosis not present

## 2016-02-10 DIAGNOSIS — Z9181 History of falling: Secondary | ICD-10-CM | POA: Diagnosis not present

## 2016-02-10 DIAGNOSIS — R278 Other lack of coordination: Secondary | ICD-10-CM | POA: Diagnosis not present

## 2016-02-10 DIAGNOSIS — M6281 Muscle weakness (generalized): Secondary | ICD-10-CM | POA: Diagnosis not present

## 2016-02-10 DIAGNOSIS — M47892 Other spondylosis, cervical region: Secondary | ICD-10-CM | POA: Diagnosis not present

## 2016-02-11 DIAGNOSIS — R29898 Other symptoms and signs involving the musculoskeletal system: Secondary | ICD-10-CM | POA: Diagnosis not present

## 2016-02-11 DIAGNOSIS — R41841 Cognitive communication deficit: Secondary | ICD-10-CM | POA: Diagnosis not present

## 2016-02-11 DIAGNOSIS — M6281 Muscle weakness (generalized): Secondary | ICD-10-CM | POA: Diagnosis not present

## 2016-02-11 DIAGNOSIS — Z9181 History of falling: Secondary | ICD-10-CM | POA: Diagnosis not present

## 2016-02-11 DIAGNOSIS — M47892 Other spondylosis, cervical region: Secondary | ICD-10-CM | POA: Diagnosis not present

## 2016-02-11 DIAGNOSIS — R278 Other lack of coordination: Secondary | ICD-10-CM | POA: Diagnosis not present

## 2016-02-12 DIAGNOSIS — M47892 Other spondylosis, cervical region: Secondary | ICD-10-CM | POA: Diagnosis not present

## 2016-02-12 DIAGNOSIS — Z9181 History of falling: Secondary | ICD-10-CM | POA: Diagnosis not present

## 2016-02-12 DIAGNOSIS — R29898 Other symptoms and signs involving the musculoskeletal system: Secondary | ICD-10-CM | POA: Diagnosis not present

## 2016-02-12 DIAGNOSIS — M6281 Muscle weakness (generalized): Secondary | ICD-10-CM | POA: Diagnosis not present

## 2016-02-12 DIAGNOSIS — R41841 Cognitive communication deficit: Secondary | ICD-10-CM | POA: Diagnosis not present

## 2016-02-12 DIAGNOSIS — R278 Other lack of coordination: Secondary | ICD-10-CM | POA: Diagnosis not present

## 2016-02-13 DIAGNOSIS — Z9181 History of falling: Secondary | ICD-10-CM | POA: Diagnosis not present

## 2016-02-13 DIAGNOSIS — R41841 Cognitive communication deficit: Secondary | ICD-10-CM | POA: Diagnosis not present

## 2016-02-13 DIAGNOSIS — M6281 Muscle weakness (generalized): Secondary | ICD-10-CM | POA: Diagnosis not present

## 2016-02-13 DIAGNOSIS — M47892 Other spondylosis, cervical region: Secondary | ICD-10-CM | POA: Diagnosis not present

## 2016-02-13 DIAGNOSIS — R29898 Other symptoms and signs involving the musculoskeletal system: Secondary | ICD-10-CM | POA: Diagnosis not present

## 2016-02-13 DIAGNOSIS — R278 Other lack of coordination: Secondary | ICD-10-CM | POA: Diagnosis not present

## 2016-02-16 DIAGNOSIS — M6281 Muscle weakness (generalized): Secondary | ICD-10-CM | POA: Diagnosis not present

## 2016-02-16 DIAGNOSIS — R29898 Other symptoms and signs involving the musculoskeletal system: Secondary | ICD-10-CM | POA: Diagnosis not present

## 2016-02-16 DIAGNOSIS — R41841 Cognitive communication deficit: Secondary | ICD-10-CM | POA: Diagnosis not present

## 2016-02-16 DIAGNOSIS — Z9181 History of falling: Secondary | ICD-10-CM | POA: Diagnosis not present

## 2016-02-16 DIAGNOSIS — M47892 Other spondylosis, cervical region: Secondary | ICD-10-CM | POA: Diagnosis not present

## 2016-02-16 DIAGNOSIS — R278 Other lack of coordination: Secondary | ICD-10-CM | POA: Diagnosis not present

## 2016-02-17 DIAGNOSIS — R41841 Cognitive communication deficit: Secondary | ICD-10-CM | POA: Diagnosis not present

## 2016-02-17 DIAGNOSIS — R278 Other lack of coordination: Secondary | ICD-10-CM | POA: Diagnosis not present

## 2016-02-17 DIAGNOSIS — Z9181 History of falling: Secondary | ICD-10-CM | POA: Diagnosis not present

## 2016-02-17 DIAGNOSIS — R29898 Other symptoms and signs involving the musculoskeletal system: Secondary | ICD-10-CM | POA: Diagnosis not present

## 2016-02-17 DIAGNOSIS — M47892 Other spondylosis, cervical region: Secondary | ICD-10-CM | POA: Diagnosis not present

## 2016-02-17 DIAGNOSIS — M6281 Muscle weakness (generalized): Secondary | ICD-10-CM | POA: Diagnosis not present

## 2016-02-18 DIAGNOSIS — M6281 Muscle weakness (generalized): Secondary | ICD-10-CM | POA: Diagnosis not present

## 2016-02-18 DIAGNOSIS — R29898 Other symptoms and signs involving the musculoskeletal system: Secondary | ICD-10-CM | POA: Diagnosis not present

## 2016-02-18 DIAGNOSIS — M47892 Other spondylosis, cervical region: Secondary | ICD-10-CM | POA: Diagnosis not present

## 2016-02-18 DIAGNOSIS — R41841 Cognitive communication deficit: Secondary | ICD-10-CM | POA: Diagnosis not present

## 2016-02-18 DIAGNOSIS — R278 Other lack of coordination: Secondary | ICD-10-CM | POA: Diagnosis not present

## 2016-02-18 DIAGNOSIS — Z9181 History of falling: Secondary | ICD-10-CM | POA: Diagnosis not present

## 2016-02-20 DIAGNOSIS — R278 Other lack of coordination: Secondary | ICD-10-CM | POA: Diagnosis not present

## 2016-02-20 DIAGNOSIS — Z9181 History of falling: Secondary | ICD-10-CM | POA: Diagnosis not present

## 2016-02-20 DIAGNOSIS — M47892 Other spondylosis, cervical region: Secondary | ICD-10-CM | POA: Diagnosis not present

## 2016-02-20 DIAGNOSIS — R41841 Cognitive communication deficit: Secondary | ICD-10-CM | POA: Diagnosis not present

## 2016-02-20 DIAGNOSIS — M6281 Muscle weakness (generalized): Secondary | ICD-10-CM | POA: Diagnosis not present

## 2016-02-20 DIAGNOSIS — R29898 Other symptoms and signs involving the musculoskeletal system: Secondary | ICD-10-CM | POA: Diagnosis not present

## 2016-02-23 DIAGNOSIS — M6281 Muscle weakness (generalized): Secondary | ICD-10-CM | POA: Diagnosis not present

## 2016-02-23 DIAGNOSIS — R41841 Cognitive communication deficit: Secondary | ICD-10-CM | POA: Diagnosis not present

## 2016-02-23 DIAGNOSIS — R29898 Other symptoms and signs involving the musculoskeletal system: Secondary | ICD-10-CM | POA: Diagnosis not present

## 2016-02-23 DIAGNOSIS — Z9181 History of falling: Secondary | ICD-10-CM | POA: Diagnosis not present

## 2016-02-23 DIAGNOSIS — M47892 Other spondylosis, cervical region: Secondary | ICD-10-CM | POA: Diagnosis not present

## 2016-02-23 DIAGNOSIS — R278 Other lack of coordination: Secondary | ICD-10-CM | POA: Diagnosis not present

## 2016-02-24 DIAGNOSIS — R278 Other lack of coordination: Secondary | ICD-10-CM | POA: Diagnosis not present

## 2016-02-24 DIAGNOSIS — Z9181 History of falling: Secondary | ICD-10-CM | POA: Diagnosis not present

## 2016-02-24 DIAGNOSIS — R29898 Other symptoms and signs involving the musculoskeletal system: Secondary | ICD-10-CM | POA: Diagnosis not present

## 2016-02-24 DIAGNOSIS — M6281 Muscle weakness (generalized): Secondary | ICD-10-CM | POA: Diagnosis not present

## 2016-02-24 DIAGNOSIS — M47892 Other spondylosis, cervical region: Secondary | ICD-10-CM | POA: Diagnosis not present

## 2016-02-24 DIAGNOSIS — R41841 Cognitive communication deficit: Secondary | ICD-10-CM | POA: Diagnosis not present

## 2016-02-26 ENCOUNTER — Encounter: Payer: Self-pay | Admitting: Internal Medicine

## 2016-02-26 ENCOUNTER — Non-Acute Institutional Stay: Payer: Medicare Other | Admitting: Internal Medicine

## 2016-02-26 VITALS — BP 118/68 | HR 63 | Temp 98.3°F | Ht 73.0 in | Wt 148.0 lb

## 2016-02-26 DIAGNOSIS — I10 Essential (primary) hypertension: Secondary | ICD-10-CM | POA: Diagnosis not present

## 2016-02-26 DIAGNOSIS — R634 Abnormal weight loss: Secondary | ICD-10-CM | POA: Diagnosis not present

## 2016-02-26 DIAGNOSIS — K219 Gastro-esophageal reflux disease without esophagitis: Secondary | ICD-10-CM

## 2016-02-26 DIAGNOSIS — R29818 Other symptoms and signs involving the nervous system: Secondary | ICD-10-CM

## 2016-02-26 DIAGNOSIS — R29898 Other symptoms and signs involving the musculoskeletal system: Secondary | ICD-10-CM | POA: Diagnosis not present

## 2016-02-26 DIAGNOSIS — R609 Edema, unspecified: Secondary | ICD-10-CM

## 2016-02-26 DIAGNOSIS — E785 Hyperlipidemia, unspecified: Secondary | ICD-10-CM | POA: Diagnosis not present

## 2016-02-26 DIAGNOSIS — M47892 Other spondylosis, cervical region: Secondary | ICD-10-CM | POA: Diagnosis not present

## 2016-02-26 DIAGNOSIS — R1310 Dysphagia, unspecified: Secondary | ICD-10-CM

## 2016-02-26 DIAGNOSIS — M6281 Muscle weakness (generalized): Secondary | ICD-10-CM | POA: Diagnosis not present

## 2016-02-26 DIAGNOSIS — R41841 Cognitive communication deficit: Secondary | ICD-10-CM | POA: Diagnosis not present

## 2016-02-26 DIAGNOSIS — D638 Anemia in other chronic diseases classified elsewhere: Secondary | ICD-10-CM

## 2016-02-26 DIAGNOSIS — R278 Other lack of coordination: Secondary | ICD-10-CM | POA: Diagnosis not present

## 2016-02-26 DIAGNOSIS — R2689 Other abnormalities of gait and mobility: Secondary | ICD-10-CM

## 2016-02-26 DIAGNOSIS — Z9181 History of falling: Secondary | ICD-10-CM | POA: Diagnosis not present

## 2016-02-26 NOTE — Progress Notes (Signed)
[facility] Room Number: [phone]  Place of Service:   Clinic    Allergies  Allergen Reactions  . Aspirin   . Eggs Or Egg-Derived Products Swelling    throat    Chief Complaint  Patient presents with  . Medical Management of Chronic Issues    BP, GERD    HPI:  Anemia of chronic disease - Improved; last hemoglobin 12.0  Balance problem - unchanged. Patient weighs bowel problems are related to his stroke in 2007 and does not anticipate they will get any better.  Edema, unspecified type - approximately 2+ bipedal. Unchanged.  HLD (hyperlipidemia) - controlled-   Loss of weight -weight is improved and he is gaining    Gastroesophageal reflux disease without esophagitis - asymptomatic   Essential hypertension - Controlled  Dysphagia - patient has had some episodes of coughing meals suggestive of small aspirations. Happens about twice weekly. He has seen speech therapist for some exercises to try to help his swallowing abilities   Patient says he is moving independent living soon. Insurance will not pay for his assisted living area.  Medications: Patient's Medications  New Prescriptions   No medications on file  Previous Medications   CHOLECALCIFEROL (VITAMIN D) 400 UNITS TABS TABLET    Take 400 Units by mouth 2 (two) times daily.   CLOPIDOGREL (PLAVIX) 75 MG TABLET    Take 75 mg by mouth daily. Start PLAVIX from 07/24/15 if no further hematuria is reported by patient and HB > 12.   DIGOXIN (LANOXIN) 0.125 MG TABLET    Take 0.125 mg by mouth daily.   DUTASTERIDE (AVODART) 0.5 MG CAPSULE    Take 0.5 mg by mouth daily.    LEVOTHYROXINE (SYNTHROID, LEVOTHROID) 25 MCG TABLET    Take one tablet each morning before breakfast for thyroid   NITROSTAT 0.4 MG SL TABLET    Place 0.4 mg under the tongue every 5 (five) minutes as needed for chest pain.    POLYETHYLENE GLYCOL (MIRALAX / GLYCOLAX) PACKET    Take 17 g by mouth every other day.   PRAVASTATIN (PRAVACHOL) 20  MG TABLET    Take 20 mg by mouth every evening.    TRIAMTERENE-HYDROCHLOROTHIAZIDE (DYAZIDE) 37.5-25 MG CAPSULE    Take 1 capsule by mouth daily.  Modified Medications   No medications on file  Discontinued Medications   No medications on file     Review of Systems  Constitutional: Negative for activity change, appetite change, chills, diaphoresis, fatigue, fever and unexpected weight change.       History of weight loss recently had a poor appetite related sinus problems.  HENT: Positive for postnasal drip, rhinorrhea and sinus pressure. Negative for congestion, ear pain, hearing loss, sore throat, tinnitus, trouble swallowing and voice change.        Chronic sinusitis.  Eyes:       Corrective lenses. Cataracts. History of retinal bleed.  Respiratory: Negative for cough, choking, chest tightness, shortness of breath and wheezing.   Cardiovascular: Positive for leg swelling. Negative for chest pain and palpitations.       Chronict leg swelling. Recommended Ted hose but states has them at home will wear them at ALF.  History of paroxysmal atrial fibrillation and PVCs.  Gastrointestinal: Negative for abdominal distention, abdominal pain, constipation, diarrhea and nausea.       Some coughing after swallowing that suggests a mild dysphagia problem.  Endocrine: Negative.  Negative for cold intolerance, heat intolerance, polydipsia,  polyphagia and polyuria.  Genitourinary: Negative.  Negative for dysuria, frequency, testicular pain and urgency.       Not incontinent  Musculoskeletal: Positive for gait problem. Negative for arthralgias, back pain, joint swelling, myalgias and neck pain.       Continues to workout on Nu-Step to strengthen himself  Skin: Negative for color change, pallor and rash.  Allergic/Immunologic: Negative.   Neurological: Positive for weakness. Negative for dizziness, tremors, seizures, syncope, speech difficulty, light-headedness, numbness and headaches.       Weakness  of the legs. Poor balance. History of cerebral atrophy. History of microvascular disease of the brain. History of prior CVA in 2007.  Hematological: Negative for adenopathy. Does not bruise/bleed easily.  Psychiatric/Behavioral: Negative for behavioral problems, confusion, decreased concentration, hallucinations and sleep disturbance. The patient is not nervous/anxious.     Vitals:   02/26/16 1420  BP: 118/68  Pulse: 63  Temp: 98.3 F (36.8 C)  TempSrc: Oral  SpO2: 95%  Weight: 148 lb (67.1 kg)  Height: '6\' 1"'  (1.854 m)   Wt Readings from Last 3 Encounters:  02/26/16 148 lb (67.1 kg)  01/21/16 143 lb 11.2 oz (65.2 kg)  11/06/15 141 lb (64 kg)    Body mass index is 19.53 kg/m.  Physical Exam  Constitutional: He is oriented to person, place, and time. He appears well-developed.  Frail Elderly in no acute distress   HENT:  Head: Normocephalic.  Right Ear: External ear normal.  Left Ear: External ear normal.  Mouth/Throat: Oropharynx is clear and moist.  Eyes: Conjunctivae and EOM are normal. Pupils are equal, round, and reactive to light. Right eye exhibits no discharge. No scleral icterus.  Neck: Normal range of motion. No JVD present. No tracheal deviation present. No thyromegaly present.  Cardiovascular: Normal rate, regular rhythm, normal heart sounds and intact distal pulses.  Exam reveals no gallop and no friction rub.   No murmur heard. Multiple PVCs noted  Pulmonary/Chest: Effort normal and breath sounds normal. No respiratory distress. He has no wheezes. He has no rales.  Abdominal: Soft. Bowel sounds are normal. He exhibits no distension and no mass. There is no tenderness. There is no rebound and no guarding.  Prior herniorrhaphy scar  Musculoskeletal: Normal range of motion. He exhibits edema (edema of both lower legs and feet. Worse on the right.). He exhibits no tenderness.  Weakness of both lower extremities.  Lymphadenopathy:    He has no cervical adenopathy.    Neurological: He is oriented to person, place, and time.  Skin: Skin is warm. No rash noted. No erythema. No pallor.  Psychiatric: He has a normal mood and affect. His behavior is normal. Judgment and thought content normal.     Labs reviewed: Lab Summary Latest Ref Rng & Units 01/22/2016 08/26/2015 07/23/2015  Hemoglobin 13.5 - 17.5 g/dL 13.0(A) 10.8(A) 12.8(A)  Hematocrit 41 - 53 % (None) 33(A) 40(A)  White count 10:3/mL 5.4 3.6 4.9  Platelet count 150 - 399 K/L 209 227 165  Sodium 137 - 147 mmol/L 137 139 139  Potassium 3.4 - 5.3 mmol/L 3.9 4.1 4.1  Calcium - (None) (None) (None)  Phosphorus - (None) (None) (None)  Creatinine 0.6 - 1.3 mg/dL 0.8 0.9 0.9  AST 14 - 40 U/L 13(A) 11(A) 21  Alk Phos 25 - 125 U/L 57 57 59  Bilirubin - (None) (None) (None)  Glucose mg/dL 80 88 89  Cholesterol 0 - 200 mg/dL (None) 113 (None)  HDL cholesterol 35 - 70  mg/dL (None) 48 (None)  Triglycerides 40 - 160 mg/dL (None) 61 (None)  LDL Direct - (None) (None) (None)  LDL Calc mg/dL (None) 53 (None)  Total protein - (None) (None) (None)  Albumin - (None) (None) (None)  Some recent data might be hidden   Lab Results  Component Value Date   TSH 5.12 01/22/2016   Lab Results  Component Value Date   BUN 30 (A) 08/26/2015   BUN 20 07/23/2015   BUN 20 07/21/2015   Lab Results  Component Value Date   CREATININE 0.8 01/22/2016   CREATININE 0.9 08/26/2015   CREATININE 0.9 07/23/2015   No results found for: HGBA1C     Assessment/Plan  1. Anemia of chronic disease Repeat CBC in the future  2. Balance problem Continue to use wheelchair to get dining area, since he feels that uses the walker is potentially dangerous because of risk of falling.  3. Edema, unspecifiedtype  continue current medications and compression stockings   4. HLD (hyperlipidemia)   Follow-up in the future           5. Loss of weight Improving  6. Gastroesophageal reflux disease without esophagitis Asymptomatic    7. Essential hypertension Controlled   8. Dysphagia Continue exercises from speech therapist and try chin tuck maneuver

## 2016-03-04 DIAGNOSIS — I10 Essential (primary) hypertension: Secondary | ICD-10-CM | POA: Diagnosis not present

## 2016-03-04 DIAGNOSIS — M6281 Muscle weakness (generalized): Secondary | ICD-10-CM | POA: Diagnosis not present

## 2016-03-04 DIAGNOSIS — M8580 Other specified disorders of bone density and structure, unspecified site: Secondary | ICD-10-CM | POA: Diagnosis not present

## 2016-03-04 DIAGNOSIS — K1379 Other lesions of oral mucosa: Secondary | ICD-10-CM | POA: Diagnosis not present

## 2016-03-04 DIAGNOSIS — R278 Other lack of coordination: Secondary | ICD-10-CM | POA: Diagnosis not present

## 2016-03-04 DIAGNOSIS — R29898 Other symptoms and signs involving the musculoskeletal system: Secondary | ICD-10-CM | POA: Diagnosis not present

## 2016-03-04 DIAGNOSIS — M47892 Other spondylosis, cervical region: Secondary | ICD-10-CM | POA: Diagnosis not present

## 2016-03-04 DIAGNOSIS — R1312 Dysphagia, oropharyngeal phase: Secondary | ICD-10-CM | POA: Diagnosis not present

## 2016-03-04 DIAGNOSIS — R41841 Cognitive communication deficit: Secondary | ICD-10-CM | POA: Diagnosis not present

## 2016-03-04 DIAGNOSIS — R296 Repeated falls: Secondary | ICD-10-CM | POA: Diagnosis not present

## 2016-03-04 DIAGNOSIS — R011 Cardiac murmur, unspecified: Secondary | ICD-10-CM | POA: Diagnosis not present

## 2016-03-04 DIAGNOSIS — Z9181 History of falling: Secondary | ICD-10-CM | POA: Diagnosis not present

## 2016-03-24 ENCOUNTER — Non-Acute Institutional Stay: Payer: Medicare Other | Admitting: Nurse Practitioner

## 2016-03-24 ENCOUNTER — Encounter: Payer: Self-pay | Admitting: Nurse Practitioner

## 2016-03-24 DIAGNOSIS — R609 Edema, unspecified: Secondary | ICD-10-CM

## 2016-03-24 DIAGNOSIS — N400 Enlarged prostate without lower urinary tract symptoms: Secondary | ICD-10-CM | POA: Diagnosis not present

## 2016-03-24 DIAGNOSIS — D638 Anemia in other chronic diseases classified elsewhere: Secondary | ICD-10-CM | POA: Diagnosis not present

## 2016-03-24 DIAGNOSIS — R269 Unspecified abnormalities of gait and mobility: Secondary | ICD-10-CM

## 2016-03-24 DIAGNOSIS — R7989 Other specified abnormal findings of blood chemistry: Secondary | ICD-10-CM

## 2016-03-24 DIAGNOSIS — R946 Abnormal results of thyroid function studies: Secondary | ICD-10-CM

## 2016-03-24 DIAGNOSIS — I10 Essential (primary) hypertension: Secondary | ICD-10-CM | POA: Diagnosis not present

## 2016-03-24 DIAGNOSIS — R627 Adult failure to thrive: Secondary | ICD-10-CM | POA: Diagnosis not present

## 2016-03-24 DIAGNOSIS — K219 Gastro-esophageal reflux disease without esophagitis: Secondary | ICD-10-CM

## 2016-03-24 NOTE — Assessment & Plan Note (Signed)
Controlled, continue Dyazide and Avodart.

## 2016-03-24 NOTE — Assessment & Plan Note (Signed)
08/26/15 TP 5.4, albumin 3.0

## 2016-03-24 NOTE — Assessment & Plan Note (Signed)
Continue therapy, goal is to return [facility] when able.

## 2016-03-24 NOTE — Assessment & Plan Note (Signed)
stable °

## 2016-03-24 NOTE — Assessment & Plan Note (Signed)
08/26/15 TSH 4.91 01/22/16 TSH 5.12 Continue Levothyroxine 57mcg.

## 2016-03-24 NOTE — Assessment & Plan Note (Addendum)
Well controlled, taking Dyazide 37.5/25mg  daily, 08/26/15 Na 139, K 4.1, Bun 30, creat 0.9. 01/22/16 Hgb 13.0, Na 137, K 3.9, Bun 30, creat 0.79, TP 6.1, albumin 3.4, TSH 5.12

## 2016-03-24 NOTE — Assessment & Plan Note (Signed)
Stable, continue Avodart 0.5mg  daily.

## 2016-03-24 NOTE — Progress Notes (Signed)
Location:  [place] [facility] Room Number: [id number] Place of Service:  ALF [phone]) Provider:  Jimi Schappert, Manxie  NP  Jeanmarie Hubert, MD  Patient Care Team: Estill Dooms, MD as PCP - General (Internal Medicine) Danella Sensing, MD as Consulting Physician (Dermatology) Jermery Caratachea Otho Darner, NP as Nurse Practitioner (Internal Medicine)  Extended Emergency Contact Information Primary Emergency Contact: Petite,Bill Address: [address]          [place], [place] [address] Johnnette Litter of [place] Phone: [phone] Mobile Phone: [phone] Relation: Brother Secondary Emergency Contact: Rosaland Lao [place] of [place] Mobile Phone: [phone] Relation: Relative  Code Status:  Full Code Goals of care: Advanced Directive information Advanced Directives 03/24/2016  Does patient have an advance directive? Yes  Type of Paramedic of [place];Out of facility DNR (pink MOST or yellow form)  Does patient want to make changes to advanced directive? No - Patient declined  Copy of advanced directive(s) in chart? Yes  Pre-existing out of facility DNR order (yellow form or pink MOST form) -     Chief Complaint  Patient presents with  . Medical Management of Chronic Issues    HPI:  Pt is a 82 y.o. male seen today for medical management of chronic diseases.     Hx of dysrhythmia, heart rate is in control, taking Digoxin 0.125mg , edema is well managed with Dyazide 37.5/25mg  daily, no urinary retention on Avodart 0.5mg , his gait and physical strength have improved since admitted to AL and working with physical therapy.    Past Medical History:  Diagnosis Date  . Anginal pain ([place])    nuclear stress 03/12/13 EPIC  . Balance problem 07/17/2015  . Basal cell carcinoma    left forearm  . BPH (benign prostatic hyperplasia)   . Cataract, nuclear 08/08/2014  . 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 Cervical spondylosis without myelopathy 08/22/2015  .  Diverticulosis   . Diverticulosis   . Diverticulosis of colon without hemorrhage 08/22/2015  . Edema 08/18/2015  . Failure to thrive in adult 07/17/2015  . Fall 07/17/2015  . Gait disturbance 08/22/2015  . GERD (gastroesophageal reflux disease)    occurs rarely  . History of CVA (cerebrovascular accident) 07/17/2015  . HLD (hyperlipidemia) 07/17/2015  . Hyperlipidemia   . Hypertension   . Hypertension   . Inguinal hernia   . Left carotid bruit   . Loss of weight 08/22/2015  . Melanoma ([place])    left forearm  . Osteopenia   . Paroxysmal atrial fibrillation ([facility])   . Physical deconditioning 08/22/2015  . Retinal hemorrhage 04/24/2014  . Rosacea blepharoconjunctivitis 11/20/2011  . Stroke ([place] [facility]) 3/07   affected balance  . Tubular adenoma of colon 2007  . Weakness 07/17/2015   Past Surgical History:  Procedure Laterality Date  . COLONOSCOPY  2008  . EYE SURGERY Right    cataract extraction with IOL  . INGUINAL HERNIA REPAIR Right 03/15/2013   Procedure: HERNIA REPAIR INGUINAL ADULT;  Surgeon: Odis Hollingshead, MD;  Location: [facility];  Service: General;  Laterality: Right;  . INSERTION OF MESH Right 03/15/2013   Procedure: INSERTION OF MESH;  Surgeon: Odis Hollingshead, MD;  Location: [facility];  Service: General;  Laterality: Right;  . left forearm melanoma    . PROSTATE BIOPSY    . TONSILLECTOMY  1933    Allergies  Allergen Reactions  . Aspirin   . Eggs Or Egg-Derived Products Swelling    throat  Medication List       Accurate as of 03/24/16  5:01 PM. Always use your most recent med list.          cholecalciferol 400 units Tabs tablet Commonly known as:  VITAMIN D Take 400 Units by mouth 2 (two) times daily.   clopidogrel 75 MG tablet Commonly known as:  PLAVIX Take 75 mg by mouth daily. Start PLAVIX from 07/24/15 if no further hematuria is reported by patient and HB > 12.   dutasteride 0.5 MG capsule Commonly known as:  AVODART Take 0.5 mg by mouth daily.     LANOXIN 0.125 MG tablet Generic drug:  digoxin Take 0.125 mg by mouth daily.   levothyroxine 25 MCG tablet Commonly known as:  SYNTHROID, LEVOTHROID Take one tablet each morning before breakfast for thyroid   NITROSTAT 0.4 MG SL tablet Generic drug:  nitroGLYCERIN Place 0.4 mg under the tongue every 5 (five) minutes as needed for chest pain.   polyethylene glycol packet Commonly known as:  MIRALAX / GLYCOLAX Take 17 g by mouth every other day.   pravastatin 20 MG tablet Commonly known as:  PRAVACHOL Take 20 mg by mouth every evening.   triamterene-hydrochlorothiazide 37.5-25 MG capsule Commonly known as:  DYAZIDE Take 1 capsule by mouth daily.       Review of Systems  Constitutional: Negative for activity change, appetite change, chills, diaphoresis, fatigue, fever and unexpected weight change.       History of weight loss recently had a poor appetite related sinus problems.  HENT: Positive for postnasal drip, rhinorrhea and sinus pressure. Negative for congestion, ear pain, hearing loss, sore throat, tinnitus, trouble swallowing and voice change.        Chronic sinusitis.  Eyes:       Corrective lenses. Cataracts. History of retinal bleed.  Respiratory: Negative for cough, choking, chest tightness, shortness of breath and wheezing.   Cardiovascular: Negative for chest pain, palpitations and leg swelling.       Chronict leg swelling. Recommended Ted hose but states has them at home will wear them at ALF.  History of paroxysmal atrial fibrillation and PVCs.  Gastrointestinal: Negative for abdominal distention, abdominal pain, constipation, diarrhea and nausea.       Some coughing after swallowing that suggests a mild dysphagia problem.  Endocrine: Negative.  Negative for cold intolerance, heat intolerance, polydipsia, polyphagia and polyuria.  Genitourinary: Negative.  Negative for dysuria, frequency, testicular pain and urgency.       Not incontinent  Musculoskeletal:  Positive for gait problem. Negative for arthralgias, back pain, joint swelling, myalgias and neck pain.       Continues to workout on Nu-Step to strengthen himself  Skin: Negative for color change, pallor and rash.  Allergic/Immunologic: Negative.   Neurological: Positive for weakness. Negative for dizziness, tremors, seizures, syncope, speech difficulty, light-headedness, numbness and headaches.       Weakness of the legs. Poor balance. History of cerebral atrophy. History of microvascular disease of the brain. History of prior CVA in 2007.  Hematological: Negative for adenopathy. Does not bruise/bleed easily.  Psychiatric/Behavioral: Negative for behavioral problems, confusion, decreased concentration, hallucinations and sleep disturbance. The patient is not nervous/anxious.     Immunization History  Administered Date(s) Administered  . PPD Test 07/21/2015, 08/04/2015  . Tdap 07/16/2015   Pertinent  Health Maintenance Due  Topic Date Due  . INFLUENZA VACCINE  08/25/2016 (Originally 01/27/2016)  . PNA vac Low Risk Adult (1 of 2 - PCV13) 08/25/2016 (  Originally 06/19/1992)   Fall Risk  02/26/2016 11/06/2015  Falls in the past year? Yes Yes  Number falls in past yr: 1 1  Injury with Fall? Yes Yes   Functional Status Survey:    Vitals:   03/24/16 1237  BP: 118/66  Pulse: 75  Resp: 16  Temp: 97.5 F (36.4 C)  Weight: 148 lb (67.1 kg)  Height: 6\' 1"  (1.854 m)   Body mass index is 19.53 kg/m. Physical Exam  Constitutional: He is oriented to person, place, and time. He appears well-developed.  Frail Elderly in no acute distress   HENT:  Head: Normocephalic.  Right Ear: External ear normal.  Left Ear: External ear normal.  Mouth/Throat: Oropharynx is clear and moist.  Eyes: Conjunctivae and EOM are normal. Pupils are equal, round, and reactive to light. Right eye exhibits no discharge. No scleral icterus.  Neck: Normal range of motion. No JVD present. No tracheal deviation  present. No thyromegaly present.  Cardiovascular: Normal rate, regular rhythm, normal heart sounds and intact distal pulses.  Exam reveals no gallop and no friction rub.   No murmur heard. Multiple PVCs noted  Pulmonary/Chest: Effort normal and breath sounds normal. No respiratory distress. He has no wheezes. He has no rales.  Abdominal: Soft. Bowel sounds are normal. He exhibits no distension and no mass. There is no tenderness. There is no rebound and no guarding.  Prior herniorrhaphy scar  Musculoskeletal: Normal range of motion. He exhibits no edema (edema of both lower legs and feet. Worse on the right.) or tenderness.  Weakness of both lower extremities.  Lymphadenopathy:    He has no cervical adenopathy.  Neurological: He is oriented to person, place, and time.  Skin: Skin is warm. No rash noted. No erythema. No pallor.  Psychiatric: He has a normal mood and affect. His behavior is normal. Judgment and thought content normal.    Labs reviewed:  Recent Labs  07/18/15 0418 07/20/15 0835 07/21/15 0511 07/23/15 08/26/15 01/22/16  NA 139 140 139 139 139 137  K 3.6 4.0 3.9 4.1 4.1 3.9  CL 103 105 103  --   --   --   CO2 27 30 31   --   --   --   GLUCOSE 75 103* 98  --   --   --   BUN 41* 28* 20 20 30*  --   CREATININE 1.66* 1.00 0.91 0.9 0.9 0.8  CALCIUM 8.6* 8.8* 8.7*  --   --   --     Recent Labs  07/23/15 08/26/15 01/22/16  AST 21 11* 13*  ALT 16 7* 8*  ALKPHOS 59 57 57    Recent Labs  07/16/15 2239 07/17/15 0327 07/23/15 08/26/15 01/22/16  WBC 9.5 7.6 4.9 3.6 5.4  NEUTROABS 8.0*  --   --   --   --   HGB 13.6 12.7* 12.8* 10.8* 13.0*  HCT 39.0 37.6* 40* 33*  --   MCV 90.5 90.2  --   --   --   PLT 205 189 165 227 209   Lab Results  Component Value Date   TSH 5.12 01/22/2016   No results found for: HGBA1C Lab Results  Component Value Date   CHOL 113 08/26/2015   HDL 48 08/26/2015   LDLCALC 53 08/26/2015   TRIG 61 08/26/2015    Significant Diagnostic  Results in last 30 days:  No results found.  Assessment/Plan HTN (hypertension) Controlled, continue Dyazide and Avodart.   GERD (gastroesophageal  reflux disease) stable   BPH (benign prostatic hyperplasia) Stable, continue Avodart 0.5mg  daily.    Failure to thrive in adult 08/26/15 TP 5.4, albumin 3.0  Edema Well controlled, taking Dyazide 37.5/25mg  daily, 08/26/15 Na 139, K 4.1, Bun 30, creat 0.9. 01/22/16 Hgb 13.0, Na 137, K 3.9, Bun 30, creat 0.79, TP 6.1, albumin 3.4, TSH 5.12      Gait disturbance Continue therapy, goal is to return IL when able.    Anemia of chronic disease 08/26/15 Hgb 10.8 01/22/16 Hgb 13.0, Na 137, K 3.9, Bun 30, creat 0.79, TP 6.1, albumin 3.4, TSH 5.12    Elevated TSH 08/26/15 TSH 4.91 01/22/16 TSH 5.12 Continue Levothyroxine 39mcg.      Family/ staff Communication:continue AL for care assistance.   Labs/tests ordered:  none

## 2016-03-24 NOTE — Assessment & Plan Note (Addendum)
08/26/15 Hgb 10.8 01/22/16 Hgb 13.0, Na 137, K 3.9, Bun 30, creat 0.79, TP 6.1, albumin 3.4, TSH 5.12

## 2016-03-25 DIAGNOSIS — D638 Anemia in other chronic diseases classified elsewhere: Secondary | ICD-10-CM | POA: Diagnosis not present

## 2016-03-25 DIAGNOSIS — Z79899 Other long term (current) drug therapy: Secondary | ICD-10-CM | POA: Diagnosis not present

## 2016-03-25 LAB — TSH: TSH: 3.91 u[IU]/mL (ref <=5.90)

## 2016-03-31 ENCOUNTER — Other Ambulatory Visit: Payer: Self-pay | Admitting: *Deleted

## 2016-04-08 DIAGNOSIS — R1312 Dysphagia, oropharyngeal phase: Secondary | ICD-10-CM | POA: Diagnosis not present

## 2016-04-08 DIAGNOSIS — R41841 Cognitive communication deficit: Secondary | ICD-10-CM | POA: Diagnosis not present

## 2016-04-08 DIAGNOSIS — R29898 Other symptoms and signs involving the musculoskeletal system: Secondary | ICD-10-CM | POA: Diagnosis not present

## 2016-04-08 DIAGNOSIS — M8580 Other specified disorders of bone density and structure, unspecified site: Secondary | ICD-10-CM | POA: Diagnosis not present

## 2016-04-08 DIAGNOSIS — I10 Essential (primary) hypertension: Secondary | ICD-10-CM | POA: Diagnosis not present

## 2016-04-08 DIAGNOSIS — R2681 Unsteadiness on feet: Secondary | ICD-10-CM | POA: Diagnosis not present

## 2016-04-08 DIAGNOSIS — K1379 Other lesions of oral mucosa: Secondary | ICD-10-CM | POA: Diagnosis not present

## 2016-04-08 DIAGNOSIS — R278 Other lack of coordination: Secondary | ICD-10-CM | POA: Diagnosis not present

## 2016-04-08 DIAGNOSIS — R296 Repeated falls: Secondary | ICD-10-CM | POA: Diagnosis not present

## 2016-04-08 DIAGNOSIS — Z9181 History of falling: Secondary | ICD-10-CM | POA: Diagnosis not present

## 2016-04-08 DIAGNOSIS — M6281 Muscle weakness (generalized): Secondary | ICD-10-CM | POA: Diagnosis not present

## 2016-04-08 DIAGNOSIS — R011 Cardiac murmur, unspecified: Secondary | ICD-10-CM | POA: Diagnosis not present

## 2016-04-08 DIAGNOSIS — M47892 Other spondylosis, cervical region: Secondary | ICD-10-CM | POA: Diagnosis not present

## 2016-04-12 DIAGNOSIS — R41841 Cognitive communication deficit: Secondary | ICD-10-CM | POA: Diagnosis not present

## 2016-04-12 DIAGNOSIS — R1312 Dysphagia, oropharyngeal phase: Secondary | ICD-10-CM | POA: Diagnosis not present

## 2016-04-12 DIAGNOSIS — R296 Repeated falls: Secondary | ICD-10-CM | POA: Diagnosis not present

## 2016-04-12 DIAGNOSIS — R29898 Other symptoms and signs involving the musculoskeletal system: Secondary | ICD-10-CM | POA: Diagnosis not present

## 2016-04-13 DIAGNOSIS — R29898 Other symptoms and signs involving the musculoskeletal system: Secondary | ICD-10-CM | POA: Diagnosis not present

## 2016-04-13 DIAGNOSIS — R296 Repeated falls: Secondary | ICD-10-CM | POA: Diagnosis not present

## 2016-04-13 DIAGNOSIS — R41841 Cognitive communication deficit: Secondary | ICD-10-CM | POA: Diagnosis not present

## 2016-04-13 DIAGNOSIS — R1312 Dysphagia, oropharyngeal phase: Secondary | ICD-10-CM | POA: Diagnosis not present

## 2016-04-15 DIAGNOSIS — R29898 Other symptoms and signs involving the musculoskeletal system: Secondary | ICD-10-CM | POA: Diagnosis not present

## 2016-04-15 DIAGNOSIS — R41841 Cognitive communication deficit: Secondary | ICD-10-CM | POA: Diagnosis not present

## 2016-04-15 DIAGNOSIS — R1312 Dysphagia, oropharyngeal phase: Secondary | ICD-10-CM | POA: Diagnosis not present

## 2016-04-15 DIAGNOSIS — R296 Repeated falls: Secondary | ICD-10-CM | POA: Diagnosis not present

## 2016-04-20 DIAGNOSIS — R41841 Cognitive communication deficit: Secondary | ICD-10-CM | POA: Diagnosis not present

## 2016-04-20 DIAGNOSIS — R296 Repeated falls: Secondary | ICD-10-CM | POA: Diagnosis not present

## 2016-04-20 DIAGNOSIS — R1312 Dysphagia, oropharyngeal phase: Secondary | ICD-10-CM | POA: Diagnosis not present

## 2016-04-20 DIAGNOSIS — R29898 Other symptoms and signs involving the musculoskeletal system: Secondary | ICD-10-CM | POA: Diagnosis not present

## 2016-04-22 DIAGNOSIS — R29898 Other symptoms and signs involving the musculoskeletal system: Secondary | ICD-10-CM | POA: Diagnosis not present

## 2016-04-22 DIAGNOSIS — R41841 Cognitive communication deficit: Secondary | ICD-10-CM | POA: Diagnosis not present

## 2016-04-22 DIAGNOSIS — R296 Repeated falls: Secondary | ICD-10-CM | POA: Diagnosis not present

## 2016-04-22 DIAGNOSIS — R1312 Dysphagia, oropharyngeal phase: Secondary | ICD-10-CM | POA: Diagnosis not present

## 2016-04-28 DIAGNOSIS — R1312 Dysphagia, oropharyngeal phase: Secondary | ICD-10-CM | POA: Diagnosis not present

## 2016-04-28 DIAGNOSIS — R29898 Other symptoms and signs involving the musculoskeletal system: Secondary | ICD-10-CM | POA: Diagnosis not present

## 2016-04-28 DIAGNOSIS — R296 Repeated falls: Secondary | ICD-10-CM | POA: Diagnosis not present

## 2016-04-28 DIAGNOSIS — R41841 Cognitive communication deficit: Secondary | ICD-10-CM | POA: Diagnosis not present

## 2016-07-19 ENCOUNTER — Encounter: Payer: Self-pay | Admitting: Internal Medicine

## 2016-08-12 ENCOUNTER — Encounter: Payer: Self-pay | Admitting: Internal Medicine

## 2016-08-19 ENCOUNTER — Non-Acute Institutional Stay: Payer: Medicare Other | Admitting: Nurse Practitioner

## 2016-08-19 ENCOUNTER — Encounter: Payer: Self-pay | Admitting: Nurse Practitioner

## 2016-08-19 DIAGNOSIS — K219 Gastro-esophageal reflux disease without esophagitis: Secondary | ICD-10-CM | POA: Diagnosis not present

## 2016-08-19 DIAGNOSIS — I499 Cardiac arrhythmia, unspecified: Secondary | ICD-10-CM

## 2016-08-19 DIAGNOSIS — D638 Anemia in other chronic diseases classified elsewhere: Secondary | ICD-10-CM | POA: Diagnosis not present

## 2016-08-19 DIAGNOSIS — R269 Unspecified abnormalities of gait and mobility: Secondary | ICD-10-CM

## 2016-08-19 DIAGNOSIS — R609 Edema, unspecified: Secondary | ICD-10-CM | POA: Diagnosis not present

## 2016-08-19 DIAGNOSIS — N400 Enlarged prostate without lower urinary tract symptoms: Secondary | ICD-10-CM

## 2016-08-19 DIAGNOSIS — R946 Abnormal results of thyroid function studies: Secondary | ICD-10-CM | POA: Diagnosis not present

## 2016-08-19 DIAGNOSIS — R7989 Other specified abnormal findings of blood chemistry: Secondary | ICD-10-CM

## 2016-08-19 DIAGNOSIS — I10 Essential (primary) hypertension: Secondary | ICD-10-CM | POA: Diagnosis not present

## 2016-08-19 NOTE — Assessment & Plan Note (Signed)
stable °

## 2016-08-19 NOTE — Assessment & Plan Note (Signed)
Controlled, continue Dyazide and Avodart. Update CBC CMP

## 2016-08-19 NOTE — Progress Notes (Signed)
Location:  [facility] (12) Provider:  Rashiya Lofland, Manxie  NP  Jeanmarie Hubert, MD  Patient Care Team: Estill Dooms, MD as PCP - General (Internal Medicine) Danella Sensing, MD as Consulting Physician (Dermatology) Morganne Haile Otho Darner, NP as Nurse Practitioner (Internal Medicine)  Extended Emergency Contact Information Primary Emergency Contact: Schomburg,Bill Address: [address]          [place], [place] [address] Johnnette Litter of [place] Phone: [phone] Mobile Phone: [phone] Relation: Brother Secondary Emergency Contact: Rosaland Lao [place] of [place] Mobile Phone: [phone] Relation: Relative  Code Status:  Full Code Goals of care: Advanced Directive information Advanced Directives 08/19/2016  Does Patient Have a Medical Advance Directive? Yes  Type of Paramedic of [place];Out of facility DNR (pink MOST or yellow form)  Does patient want to make changes to medical advance directive? No - Patient declined  Copy of [place] in Chart? Yes  Pre-existing out of facility DNR order (yellow form or pink MOST form) Yellow form placed in chart (order not valid for inpatient use)     Chief Complaint  Patient presents with  . Medical Management of Chronic Issues    HPI:  Pt is a 83 y.o. male seen today for medical management of chronic diseases.     Hx of dysrhythmia, heart rate is in control, taking Digoxin 0.125mg , edema is well managed with Dyazide 37.5/25mg  daily, no urinary retention on Avodart 0.5mg    Past Medical History:  Diagnosis Date  . Anginal pain ([place])    nuclear stress 03/12/13 EPIC  . Balance problem 07/17/2015  . Basal cell carcinoma    left forearm  . BPH (benign prostatic hyperplasia)   . Cataract, nuclear 08/08/2014  . 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 Cervical spondylosis without myelopathy 08/22/2015  . Diverticulosis   . Diverticulosis   . Diverticulosis of  colon without hemorrhage 08/22/2015  . Edema 08/18/2015  . Failure to thrive in adult 07/17/2015  . Fall 07/17/2015  . Gait disturbance 08/22/2015  . GERD (gastroesophageal reflux disease)    occurs rarely  . History of CVA (cerebrovascular accident) 07/17/2015  . HLD (hyperlipidemia) 07/17/2015  . Hyperlipidemia   . Hypertension   . Hypertension   . Inguinal hernia   . Left carotid bruit   . Loss of weight 08/22/2015  . Melanoma ([place])    left forearm  . Osteopenia   . Paroxysmal atrial fibrillation ([facility])   . Physical deconditioning 08/22/2015  . Retinal hemorrhage 04/24/2014  . Rosacea blepharoconjunctivitis 11/20/2011  . Stroke ([place]) 3/07   affected balance  . Tubular adenoma of colon 2007  . Weakness 07/17/2015   Past Surgical History:  Procedure Laterality Date  . COLONOSCOPY  2008  . EYE SURGERY Right    cataract extraction with IOL  . INGUINAL HERNIA REPAIR Right 03/15/2013   Procedure: HERNIA REPAIR INGUINAL ADULT;  Surgeon: Odis Hollingshead, MD;  Location: [facility];  Service: General;  Laterality: Right;  . INSERTION OF MESH Right 03/15/2013   Procedure: INSERTION OF MESH;  Surgeon: Odis Hollingshead, MD;  Location: [facility];  Service: General;  Laterality: Right;  . left forearm melanoma    . PROSTATE BIOPSY    . TONSILLECTOMY  1933    Allergies  Allergen Reactions  . Aspirin   . Eggs Or Egg-Derived Products Swelling    throat    Allergies as of 08/19/2016  Reactions   Aspirin    Eggs Or Egg-derived Products Swelling   throat      Medication List       Accurate as of 08/19/16  3:46 PM. Always use your most recent med list.          cholecalciferol 400 units Tabs tablet Commonly known as:  VITAMIN D Take 400 Units by mouth 2 (two) times daily.   clopidogrel 75 MG tablet Commonly known as:  PLAVIX Take 75 mg by mouth daily. Start PLAVIX from 07/24/15 if no further hematuria is reported by patient and HB > 12.   dutasteride 0.5 MG capsule Commonly known  as:  AVODART Take 0.5 mg by mouth daily.   LANOXIN 0.125 MG tablet Generic drug:  digoxin Take 0.125 mg by mouth daily.   levothyroxine 25 MCG tablet Commonly known as:  SYNTHROID, LEVOTHROID Take one tablet each morning before breakfast for thyroid   NITROSTAT 0.4 MG SL tablet Generic drug:  nitroGLYCERIN Place 0.4 mg under the tongue every 5 (five) minutes as needed for chest pain.   polyethylene glycol packet Commonly known as:  MIRALAX / GLYCOLAX Take 17 g by mouth every other day.   pravastatin 20 MG tablet Commonly known as:  PRAVACHOL Take 20 mg by mouth every evening.   triamterene-hydrochlorothiazide 37.5-25 MG capsule Commonly known as:  DYAZIDE Take 1 capsule by mouth daily.       Review of Systems  Constitutional: Negative for activity change, appetite change, chills, diaphoresis, fatigue, fever and unexpected weight change.       History of weight loss recently had a poor appetite related sinus problems.  HENT: Positive for postnasal drip and rhinorrhea. Negative for congestion, ear pain, hearing loss, sinus pressure, sore throat, tinnitus, trouble swallowing and voice change.        Chronic sinusitis.  Eyes:       Corrective lenses. Cataracts. History of retinal bleed.  Respiratory: Negative for cough, choking, chest tightness, shortness of breath and wheezing.   Cardiovascular: Negative for chest pain, palpitations and leg swelling.       Chronict leg swelling. Recommended Ted hose but states has them at home will wear them at ALF.  History of paroxysmal atrial fibrillation and PVCs.  Gastrointestinal: Negative for abdominal distention, abdominal pain, constipation, diarrhea and nausea.       Some coughing after swallowing that suggests a mild dysphagia problem.  Endocrine: Negative.  Negative for cold intolerance, heat intolerance, polydipsia, polyphagia and polyuria.  Genitourinary: Negative.  Negative for dysuria, frequency, testicular pain and urgency.         Not incontinent  Musculoskeletal: Positive for gait problem. Negative for arthralgias, back pain, joint swelling, myalgias and neck pain.       Continues to workout on Nu-Step to strengthen himself  Skin: Negative for color change, pallor and rash.  Allergic/Immunologic: Negative.   Neurological: Positive for weakness. Negative for dizziness, tremors, seizures, syncope, speech difficulty, light-headedness, numbness and headaches.       Weakness of the legs. Poor balance. History of cerebral atrophy. History of microvascular disease of the brain. History of prior CVA in 2007.  Hematological: Negative for adenopathy. Does not bruise/bleed easily.  Psychiatric/Behavioral: Negative for behavioral problems, confusion, decreased concentration, hallucinations and sleep disturbance. The patient is not nervous/anxious.     Immunization History  Administered Date(s) Administered  . PPD Test 07/21/2015, 08/04/2015  . Tdap 07/16/2015   Pertinent  Health Maintenance Due  Topic Date Due  .  INFLUENZA VACCINE  08/25/2016 (Originally 01/27/2016)  . PNA vac Low Risk Adult (1 of 2 - PCV13) 08/25/2016 (Originally 06/19/1992)   Fall Risk  02/26/2016 11/06/2015  Falls in the past year? Yes Yes  Number falls in past yr: 1 1  Injury with Fall? Yes Yes   Functional Status Survey:    Vitals:   08/19/16 1500  BP: 120/62  Pulse: 70  Resp: 20  Temp: 97.7 F (36.5 C)  SpO2: 98%  Weight: 151 lb 9.6 oz (68.8 kg)  Height: 6\' 1"  (1.854 m)   Body mass index is 20 kg/m. Physical Exam  Constitutional: He is oriented to person, place, and time. He appears well-developed.  Frail Elderly in no acute distress   HENT:  Head: Normocephalic.  Right Ear: External ear normal.  Left Ear: External ear normal.  Mouth/Throat: Oropharynx is clear and moist.  Eyes: Conjunctivae and EOM are normal. Pupils are equal, round, and reactive to light. Right eye exhibits no discharge. No scleral icterus.  Neck: Normal  range of motion. No JVD present. No tracheal deviation present. No thyromegaly present.  Cardiovascular: Normal rate, regular rhythm, normal heart sounds and intact distal pulses.  Exam reveals no gallop and no friction rub.   No murmur heard. Multiple PVCs noted  Pulmonary/Chest: Effort normal and breath sounds normal. No respiratory distress. He has no wheezes. He has no rales.  Abdominal: Soft. Bowel sounds are normal. He exhibits no distension and no mass. There is no tenderness. There is no rebound and no guarding.  Prior herniorrhaphy scar  Musculoskeletal: Normal range of motion. He exhibits no edema (edema of both lower legs and feet. Worse on the right.) or tenderness.  Weakness of both lower extremities.  Lymphadenopathy:    He has no cervical adenopathy.  Neurological: He is oriented to person, place, and time.  Skin: Skin is warm. No rash noted. No erythema. No pallor.  Psychiatric: He has a normal mood and affect. His behavior is normal. Judgment and thought content normal.    Labs reviewed:  Recent Labs  08/26/15 01/22/16  NA 139 137  K 4.1 3.9  BUN 30*  --   CREATININE 0.9 0.8    Recent Labs  08/26/15 01/22/16  AST 11* 13*  ALT 7* 8*  ALKPHOS 57 57    Recent Labs  08/26/15 01/22/16  WBC 3.6 5.4  HGB 10.8* 13.0*  HCT 33*  --   PLT 227 209   Lab Results  Component Value Date   TSH 3.91 03/25/2016   No results found for: HGBA1C Lab Results  Component Value Date   CHOL 113 08/26/2015   HDL 48 08/26/2015   LDLCALC 53 08/26/2015   TRIG 61 08/26/2015    Significant Diagnostic Results in last 30 days:  No results found.  Assessment/Plan Elevated TSH 08/26/15 TSH 4.91, monitor TSH 01/22/16 TSH 5.12, adding Levothyroxine 65mcg daily, update TSH 8 weeks.  03/25/16 TSH 3.91 Update TSH  HTN (hypertension) Controlled, continue Dyazide and Avodart. Update CBC CMP  GERD (gastroesophageal reflux disease) stable   BPH (benign prostatic  hyperplasia) x1-2 bathroom trips at night, continue Avodart 0.5mg  daily.    Edema Well controlled, taking Dyazide 37.5/25mg  daily, update CMP  Gait disturbance W/c   Anemia of chronic disease Last Hgb 10.3 08/26/15  Irregular heart beats Heart rate is in control, continue Digoxin, update digoxin level.      Family/ staff Communication: IL  Labs/tests ordered:  CBC CMP TSH Digoxin level.

## 2016-08-19 NOTE — Assessment & Plan Note (Signed)
Heart rate is in control, continue Digoxin, update digoxin level.

## 2016-08-19 NOTE — Assessment & Plan Note (Signed)
x1-2 bathroom trips at night, continue Avodart 0.5mg  daily.

## 2016-08-19 NOTE — Assessment & Plan Note (Signed)
08/26/15 TSH 4.91, monitor TSH 01/22/16 TSH 5.12, adding Levothyroxine 81mcg daily, update TSH 8 weeks.  03/25/16 TSH 3.91 Update TSH

## 2016-08-19 NOTE — Assessment & Plan Note (Signed)
Last Hgb 10.3 08/26/15

## 2016-08-19 NOTE — Assessment & Plan Note (Signed)
W/c

## 2016-08-19 NOTE — Assessment & Plan Note (Signed)
Well controlled, taking Dyazide 37.5/25mg  daily, update CMP

## 2016-09-02 ENCOUNTER — Other Ambulatory Visit: Payer: Medicare Other

## 2016-09-02 DIAGNOSIS — D638 Anemia in other chronic diseases classified elsewhere: Secondary | ICD-10-CM | POA: Diagnosis not present

## 2016-09-02 DIAGNOSIS — R946 Abnormal results of thyroid function studies: Secondary | ICD-10-CM | POA: Diagnosis not present

## 2016-09-02 DIAGNOSIS — I499 Cardiac arrhythmia, unspecified: Secondary | ICD-10-CM | POA: Diagnosis not present

## 2016-09-02 DIAGNOSIS — I10 Essential (primary) hypertension: Secondary | ICD-10-CM | POA: Diagnosis not present

## 2016-09-02 DIAGNOSIS — R609 Edema, unspecified: Secondary | ICD-10-CM | POA: Diagnosis not present

## 2016-09-02 LAB — COMPREHENSIVE METABOLIC PANEL
ALBUMIN: 3.9 g/dL (ref 3.6–5.1)
ALK PHOS: 65 U/L (ref 40–115)
ALT: 10 U/L (ref 9–46)
AST: 13 U/L (ref 10–35)
BUN: 35 mg/dL — AB (ref 7–25)
CO2: 34 mmol/L — ABNORMAL HIGH (ref 20–31)
CREATININE: 1.21 mg/dL — AB (ref 0.70–1.11)
Calcium: 9.4 mg/dL (ref 8.6–10.3)
Chloride: 101 mmol/L (ref 98–110)
Glucose, Bld: 95 mg/dL (ref 65–99)
POTASSIUM: 4.1 mmol/L (ref 3.5–5.3)
SODIUM: 140 mmol/L (ref 135–146)
TOTAL PROTEIN: 6.6 g/dL (ref 6.1–8.1)
Total Bilirubin: 0.6 mg/dL (ref 0.2–1.2)

## 2016-09-02 LAB — CBC
HCT: 41.4 % (ref 38.5–50.0)
HEMOGLOBIN: 14.3 g/dL (ref 13.2–17.1)
MCH: 30.2 pg (ref 27.0–33.0)
MCHC: 34.5 g/dL (ref 32.0–36.0)
MCV: 87.3 fL (ref 80.0–100.0)
MPV: 10.9 fL (ref 7.5–12.5)
PLATELETS: 226 10*3/uL (ref 140–400)
RBC: 4.74 MIL/uL (ref 4.20–5.80)
RDW: 13.8 % (ref 11.0–15.0)
WBC: 5.5 10*3/uL (ref 3.8–10.8)

## 2016-09-02 LAB — DIGOXIN LEVEL: Digoxin Level: 0.6 ug/L — ABNORMAL LOW (ref 0.8–2.0)

## 2016-09-02 LAB — TSH: TSH: 5.23 m[IU]/L — AB (ref 0.40–4.50)

## 2016-09-09 ENCOUNTER — Encounter: Payer: Medicare Other | Admitting: Internal Medicine

## 2016-09-23 ENCOUNTER — Non-Acute Institutional Stay: Payer: Medicare Other | Admitting: Internal Medicine

## 2016-09-23 ENCOUNTER — Encounter: Payer: Self-pay | Admitting: Internal Medicine

## 2016-09-23 VITALS — BP 136/70 | HR 86 | Temp 98.5°F | Ht 73.0 in | Wt 152.0 lb

## 2016-09-23 DIAGNOSIS — R634 Abnormal weight loss: Secondary | ICD-10-CM | POA: Diagnosis not present

## 2016-09-23 DIAGNOSIS — I499 Cardiac arrhythmia, unspecified: Secondary | ICD-10-CM

## 2016-09-23 DIAGNOSIS — I739 Peripheral vascular disease, unspecified: Secondary | ICD-10-CM | POA: Diagnosis not present

## 2016-09-23 DIAGNOSIS — I10 Essential (primary) hypertension: Secondary | ICD-10-CM | POA: Diagnosis not present

## 2016-09-23 DIAGNOSIS — R627 Adult failure to thrive: Secondary | ICD-10-CM

## 2016-09-23 DIAGNOSIS — D638 Anemia in other chronic diseases classified elsewhere: Secondary | ICD-10-CM | POA: Diagnosis not present

## 2016-09-23 DIAGNOSIS — E785 Hyperlipidemia, unspecified: Secondary | ICD-10-CM

## 2016-09-23 DIAGNOSIS — R946 Abnormal results of thyroid function studies: Secondary | ICD-10-CM

## 2016-09-23 DIAGNOSIS — R2689 Other abnormalities of gait and mobility: Secondary | ICD-10-CM

## 2016-09-23 DIAGNOSIS — R7989 Other specified abnormal findings of blood chemistry: Secondary | ICD-10-CM

## 2016-09-23 NOTE — Progress Notes (Signed)
Facility  [place] of Service: Clinic (12) OFFICE    Allergies  Allergen Reactions  . Aspirin   . Eggs Or Egg-Derived Products Swelling    throat    Chief Complaint  Patient presents with  . Medical Management of Chronic Issues    6 month medication management anemia, blood pressure, cholesterol, review labs.    HPI:   Anemia of chronic disease - hgb back to normal on 09/02/16  Essential hypertension - controlled  Hyperlipidemia, unspecified hyperlipidemia type - no recent lab  Failure to thrive in adult - improvement in self care. Has moved to Independent living apt. He worries about his balance and potential for falling.  Loss of weight - resolved. Gaining weight now.  Elevated TSH - mildly elevated at 5.23 on 09/02/16  PVD (peripheral vascular disease) ([place]) - poor pulses at [facility]  Irregular heart beats - aware of PAC and PVC    Medications: Patient's Medications  New Prescriptions   No medications on file  Previous Medications   CHOLECALCIFEROL (VITAMIN D) 400 UNITS TABS TABLET    Take 400 Units by mouth 2 (two) times daily.   CLOPIDOGREL (PLAVIX) 75 MG TABLET    Take 75 mg by mouth daily. Start PLAVIX from 07/24/15 if no further hematuria is reported by patient and HB > 12.   DIGOXIN (LANOXIN) 0.125 MG TABLET    Take 0.125 mg by mouth daily.   DUTASTERIDE (AVODART) 0.5 MG CAPSULE    Take 0.5 mg by mouth daily.    LEVOTHYROXINE (SYNTHROID, LEVOTHROID) 25 MCG TABLET    Take one tablet each morning before breakfast for thyroid   NITROSTAT 0.4 MG SL TABLET    Place 0.4 mg under the tongue every 5 (five) minutes as needed for chest pain.    POLYETHYLENE GLYCOL (MIRALAX / GLYCOLAX) PACKET    Take 17 g by mouth every other day.   PRAVASTATIN (PRAVACHOL) 20 MG TABLET    Take 20 mg by mouth every evening.    TRIAMTERENE-HYDROCHLOROTHIAZIDE (DYAZIDE) 37.5-25 MG CAPSULE    Take 1 capsule by mouth daily.  Modified Medications   No medications on file    Discontinued Medications   No medications on file    Review of Systems  Constitutional: Negative for activity change, appetite change, chills, diaphoresis, fatigue, fever and unexpected weight change.       History of weight loss recently had a poor appetite related sinus problems.  HENT: Positive for postnasal drip and rhinorrhea. Negative for congestion, ear pain, hearing loss, sinus pressure, sore throat, tinnitus, trouble swallowing and voice change.        Chronic sinusitis.  Eyes:       Corrective lenses. Cataracts. History of retinal bleed.  Respiratory: Negative for cough, choking, chest tightness, shortness of breath and wheezing.   Cardiovascular: Positive for palpitations (PAC, PVC). Negative for chest pain and leg swelling.       Chronict leg swelling. Recommended Ted hose but states has them at home will wear them at ALF.  History of paroxysmal atrial fibrillation and PVCs.  Gastrointestinal: Negative for abdominal distention, abdominal pain, constipation, diarrhea and nausea.       Some coughing after swallowing that suggests a mild dysphagia problem.  Endocrine: Negative.  Negative for cold intolerance, heat intolerance, polydipsia, polyphagia and polyuria.  Genitourinary: Negative.  Negative for dysuria, frequency, testicular pain and urgency.       Not incontinent  Musculoskeletal: Positive  for gait problem. Negative for arthralgias, back pain, joint swelling, myalgias and neck pain.       Continues to workout on Nu-Step to strengthen himself  Skin: Negative for color change, pallor and rash.  Allergic/Immunologic: Negative.   Neurological: Positive for weakness. Negative for dizziness, tremors, seizures, syncope, speech difficulty, light-headedness, numbness and headaches.       Weakness of the legs. Poor balance. History of cerebral atrophy. History of microvascular disease of the brain. History of prior CVA in 2007.  Hematological: Negative for adenopathy. Does not  bruise/bleed easily.  Psychiatric/Behavioral: Negative for behavioral problems, confusion, decreased concentration, hallucinations and sleep disturbance. The patient is not nervous/anxious.     Vitals:   09/23/16 1453  BP: 136/70  Pulse: 86  Temp: 98.5 F (36.9 C)  TempSrc: Oral  SpO2: 96%  Weight: 152 lb (68.9 kg)  Height: _0  (1.854 m)   Body mass index is 20.05 kg/m. Wt Readings from Last 3 Encounters:  09/23/16 152 lb (68.9 kg)  08/19/16 151 lb 9.6 oz (68.8 kg)  03/24/16 148 lb (67.1 kg)      Physical Exam  Constitutional: He is oriented to person, place, and time. He appears well-developed.  Frail Elderly in no acute distress   HENT:  Head: Normocephalic.  Right Ear: External ear normal.  Left Ear: External ear normal.  Mouth/Throat: Oropharynx is clear and moist.  Eyes: Conjunctivae and EOM are normal. Pupils are equal, round, and reactive to light. Right eye exhibits no discharge. No scleral icterus.  Neck: Normal range of motion. No JVD present. No tracheal deviation present. No thyromegaly present.  Cardiovascular: Normal rate, regular rhythm, normal heart sounds and intact distal pulses.  Exam reveals no gallop and no friction rub.   No murmur heard. Multiple PVCs noted  Pulmonary/Chest: Effort normal and breath sounds normal. No respiratory distress. He has no wheezes. He has no rales.  Abdominal: Soft. Bowel sounds are normal. He exhibits no distension and no mass. There is no tenderness. There is no rebound and no guarding.  Prior herniorrhaphy scar  Musculoskeletal: Normal range of motion. He exhibits no edema (edema of both lower legs and feet. Worse on the right.) or tenderness.  Weakness of both lower extremities.  Lymphadenopathy:    He has no cervical adenopathy.  Neurological: He is oriented to person, place, and time.  Skin: Skin is warm. No rash noted. No erythema. No pallor.  Psychiatric: He has a normal mood and affect. His behavior is normal.  Judgment and thought content normal.    Labs reviewed: Lab Summary Latest Ref Rng & Units 09/02/2016 01/22/2016 08/26/2015  Hemoglobin 13.2 - 17.1 g/dL 14.3 13.0(A) 10.8(A)  Hematocrit 38.5 - 50.0 % 41.4 (None) 33(A)  White count 3.8 - 10.8 K/uL 5.5 5.4 3.6  Platelet count 140 - 400 K/uL 226 209 227  Sodium 135 - 146 mmol/L 140 137 139  Potassium 3.5 - 5.3 mmol/L 4.1 3.9 4.1  Calcium 8.6 - 10.3 mg/dL 9.4 (None) (None)  Phosphorus - (None) (None) (None)  Creatinine 0.70 - 1.11 mg/dL 1.21(H) 0.8 0.9  AST 10 - 35 U/L 13 13(A) 11(A)  Alk Phos 40 - 115 U/L 65 57 57  Bilirubin 0.2 - 1.2 mg/dL 0.6 (None) (None)  Glucose 65 - 99 mg/dL 95 80 88  Cholesterol 0 - 200 mg/dL (None) (None) 113  HDL cholesterol 35 - 70 mg/dL (None) (None) 48  Triglycerides 40 - 160 mg/dL (None) (None) 61  LDL Direct - (  None) (None) (None)  LDL Calc mg/dL (None) (None) 53  Total protein 6.1 - 8.1 g/dL 6.6 (None) (None)  Albumin 3.6 - 5.1 g/dL 3.9 (None) (None)  Some recent data might be hidden   Lab Results  Component Value Date   TSH 5.23 (H) 09/02/2016   TSH 3.91 03/25/2016   TSH 5.12 01/22/2016   Lab Results  Component Value Date   BUN 35 (H) 09/02/2016   BUN 30 (A) 08/26/2015   BUN 20 07/23/2015   No results found for: HGBA1C  Assessment/Plan  1. Anemia of chronic disease resolved  2. Essential hypertension Controlled -BMP, future  3. Hyperlipidemia, unspecified hyperlipidemia type -lipids, future  4. Failure to thrive in adult resolved  5. Loss of weight resolved  6. Elevated TSH -TSH, future  7. PVD (peripheral vascular disease) ([place]) monitor  8. Irregular heart beats -BMP, future  9. Balance problem -referal to PT for strengthening and instruction in safe mobility

## 2016-10-12 ENCOUNTER — Encounter: Payer: Self-pay | Admitting: Internal Medicine

## 2016-10-18 DIAGNOSIS — Q828 Other specified congenital malformations of skin: Secondary | ICD-10-CM | POA: Diagnosis not present

## 2016-10-18 DIAGNOSIS — L570 Actinic keratosis: Secondary | ICD-10-CM | POA: Diagnosis not present

## 2016-10-18 DIAGNOSIS — D692 Other nonthrombocytopenic purpura: Secondary | ICD-10-CM | POA: Diagnosis not present

## 2016-10-18 DIAGNOSIS — D485 Neoplasm of uncertain behavior of skin: Secondary | ICD-10-CM | POA: Diagnosis not present

## 2016-10-18 DIAGNOSIS — L821 Other seborrheic keratosis: Secondary | ICD-10-CM | POA: Diagnosis not present

## 2016-10-18 DIAGNOSIS — Z85820 Personal history of malignant melanoma of skin: Secondary | ICD-10-CM | POA: Diagnosis not present

## 2016-10-18 DIAGNOSIS — Z85828 Personal history of other malignant neoplasm of skin: Secondary | ICD-10-CM | POA: Diagnosis not present

## 2016-12-23 ENCOUNTER — Telehealth: Payer: Self-pay | Admitting: *Deleted

## 2016-12-23 DIAGNOSIS — E7849 Other hyperlipidemia: Secondary | ICD-10-CM

## 2016-12-23 DIAGNOSIS — R7989 Other specified abnormal findings of blood chemistry: Secondary | ICD-10-CM

## 2016-12-23 DIAGNOSIS — I10 Essential (primary) hypertension: Secondary | ICD-10-CM

## 2016-12-23 NOTE — Telephone Encounter (Signed)
labs

## 2017-02-04 NOTE — Addendum Note (Signed)
Addended by: Royann Shivers A on: 02/04/2017 03:46 PM   Modules accepted: Orders

## 2017-03-17 ENCOUNTER — Telehealth: Payer: Self-pay

## 2017-03-17 NOTE — Telephone Encounter (Signed)
Patient called upset.  Patient started conversation by saying " I have been overlooked"  Patient stated he did not have any pending appointment to be seen by Dr.Pandey and he thinks he was overlooked, I reviewed appointment desk and informed patient he was to get labs today and see Dr.Pandey next week.   Patient was unaware of either appointment and states no one told me about these appointment's and he did not schedule them. Patient was very confused with the process. Patient would like for Dr.Pandey's assistant to call his to discuss how to proceed.  I verified phone number on file and came to the conclusion that patient had a number on file that was 81 years old as his first point of contact and that is the number that was called to confirm pending appointment for labs today. Number was deleted and demographics updated. (this should be verified by the medical assistants at appointment in the future)   Patient would like to know if he should keep appointment with Dr.Pandy for next week and have labs afterwards or reschedule appointment with Dr.Pandey and have labs first.  [place] please follow-up with Dr.Pandey and patient, patient is expecting a return call to settle this concern.

## 2017-03-18 ENCOUNTER — Telehealth: Payer: Self-pay

## 2017-03-18 NOTE — Telephone Encounter (Signed)
He can see me first and then we will order necessary labwork.

## 2017-03-18 NOTE — Telephone Encounter (Signed)
Left a voicemail letting the patient know that he still needs to come to his appointment on Tuesday.

## 2017-03-18 NOTE — Telephone Encounter (Signed)
Spoke with the patient and he stated that he never called [employer] and left message. Patient kept repeating he didn't see the point of him coming without having blood work done first. I explained that per Dr. Bubba Camp she would prefer to see him first then order labs accordingly. Patient currently still has a clinic appointment for Tuesday September 25 at 11:30 am.   Dr. Bubba Camp spoke with the patient and he still agrees to come to the appointment.. But he was upset that a wrongful message was received at our office. Per Dr. Bubba Camp she would like a MMSE obtained during his appointment on Tuesday September 25 at 11:30 am.

## 2017-03-22 ENCOUNTER — Encounter: Payer: Self-pay | Admitting: Internal Medicine

## 2017-03-22 ENCOUNTER — Non-Acute Institutional Stay: Payer: Medicare Other | Admitting: Internal Medicine

## 2017-03-22 VITALS — BP 116/80 | HR 77 | Temp 97.7°F | Resp 18 | Ht 73.0 in | Wt 142.8 lb

## 2017-03-22 DIAGNOSIS — I739 Peripheral vascular disease, unspecified: Secondary | ICD-10-CM

## 2017-03-22 DIAGNOSIS — R634 Abnormal weight loss: Secondary | ICD-10-CM

## 2017-03-22 DIAGNOSIS — Z8673 Personal history of transient ischemic attack (TIA), and cerebral infarction without residual deficits: Secondary | ICD-10-CM

## 2017-03-22 DIAGNOSIS — N400 Enlarged prostate without lower urinary tract symptoms: Secondary | ICD-10-CM | POA: Diagnosis not present

## 2017-03-22 DIAGNOSIS — E785 Hyperlipidemia, unspecified: Secondary | ICD-10-CM

## 2017-03-22 DIAGNOSIS — E039 Hypothyroidism, unspecified: Secondary | ICD-10-CM

## 2017-03-22 DIAGNOSIS — I10 Essential (primary) hypertension: Secondary | ICD-10-CM | POA: Diagnosis not present

## 2017-03-22 DIAGNOSIS — E559 Vitamin D deficiency, unspecified: Secondary | ICD-10-CM | POA: Diagnosis not present

## 2017-03-22 DIAGNOSIS — I493 Ventricular premature depolarization: Secondary | ICD-10-CM

## 2017-03-22 NOTE — Progress Notes (Signed)
[facility]  Provider: Blanchie Serve MD   Location:  [place] of Service:  Clinic (12)  PCP: Blanchie Serve, MD Patient Care Team: Blanchie Serve, MD as PCP - General (Internal Medicine) Danella Sensing, MD as Consulting Physician (Dermatology) Mast, Man X, NP as Nurse Practitioner (Internal Medicine)  Extended Emergency Contact Information Primary Emergency Contact: Perham,Bill Address: [address]          [place], [place] [address] Johnnette Litter of [place] Phone: [phone] Mobile Phone: [phone] Relation: Brother Secondary Emergency Contact: [place] Mobile Phone: [phone] Relation: Relative   Goals of Care: Advanced Directive information Advanced Directives 09/23/2016  Does Patient Have a Medical Advance Directive? Yes  Type of Paramedic of [place];Out of facility DNR (pink MOST or yellow form)  Does patient want to make changes to medical advance directive? -  Copy of [place] in Chart? Yes  Pre-existing out of facility DNR order (yellow form or pink MOST form) Yellow form placed in chart (order not valid for inpatient use)      Chief Complaint  Patient presents with  . Medical Management of Chronic Issues    6 month follow up. No concerns at this time.  . Medication Refill    No refills needed at this time.  . Other    HPI: Patient is a 83 y.o. male seen today for routine visit. He has been complaint with his medications.   Hypertension- tolerating triamterene-hctz well.   PVCs- currently on digoxin. Pt mentions having history of afib, reviewed prior EKG showing sinus rhythm.   PVD- leg edema increases towards end of day. Leg elevation helps. Denies any wound or sore to his feet or legs.   GERD- denies any indigestion or heartburn at present.   BPH- tolerating dutasteride. Has urinary frequency.   History of CVA-  currently on plavix daily and pravastatin 20 mg daily. No fall reported.   Hypothyroidism- currently taking levothyroxine 25 mcg daily.   Chronic constipation- currently on miralax every other day.    Past Medical History:  Diagnosis Date  . Anginal pain ([place])    nuclear stress 03/12/13 EPIC  . Balance problem 07/17/2015  . Basal cell carcinoma    left forearm  . BPH (benign prostatic hyperplasia)   . Cataract, nuclear 08/08/2014  . 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 Cervical spondylosis without myelopathy 08/22/2015  . Diverticulosis   . Diverticulosis of colon without hemorrhage 08/22/2015  . Edema 08/18/2015  . Fall 07/17/2015  . Gait disturbance 08/22/2015  . GERD (gastroesophageal reflux disease)    occurs rarely  . History of CVA (cerebrovascular accident) 07/17/2015  . HLD (hyperlipidemia) 07/17/2015  . Hyperlipidemia   . Hypertension   . Inguinal hernia   . Left carotid bruit   . Melanoma ([place])    left forearm  . Osteopenia   . Paroxysmal atrial fibrillation (H[facility])   . Physical deconditioning 08/22/2015  . Retinal hemorrhage 04/24/2014  . Rosacea blepharoconjunctivitis 11/20/2011  . Stroke ([place]) 3/07   affected balance  . Tubular adenoma of colon 2007  . Weakness 07/17/2015   Past Surgical History:  Procedure Laterality Date  . COLONOSCOPY  2008  . EYE SURGERY Right    cataract extraction with IOL  . INGUINAL HERNIA REPAIR Right 03/15/2013   Procedure: HERNIA REPAIR INGUINAL ADULT;  Surgeon: Odis Hollingshead, MD;  Location: [facility];  Service: General;  Laterality: Right;  . INSERTION OF MESH Right 03/15/2013   Procedure: INSERTION OF MESH;  Surgeon: Odis Hollingshead, MD;  Location: [facility];  Service: General;  Laterality: Right;  . left forearm melanoma    . PROSTATE BIOPSY    . TONSILLECTOMY  1933    reports that he quit smoking about 44 years ago. He has never used smokeless tobacco. He reports that he does not drink alcohol or use drugs. Social History    Social History  . Marital status: Divorced    Spouse name: N/A  . Number of children: N/A  . Years of education: N/A   Occupational History  . Retired Animal nutritionist   Social History Main Topics  . Smoking status: Former Smoker    Quit date: 11/05/1972  . Smokeless tobacco: Never Used  . Alcohol use No     Comment:  3 ounces wine nightly  . Drug use: No  . Sexual activity: Not on file   Other Topics Concern  . Not on file   Social History Narrative   Diet? Regular/normal      Do you drink/eat things with caffeine? no      Marital status?      divorced                              What year were you married? 1957      Do you live in a house, apartment, assisted living, condo, trailer, etc.? Apartment. Moved to [place] 08/21/2015      Is it one or more stories? 2      How many persons live in your home? Live alone      Do you have any pets in your home? (please list) no      Current or past profession: Sales      Do you exercise?      "I do now."                            Type & how often? Exercise bike, 3 times weekly      Do you have a living will? no      Do you have a DNR form?    yes                              If not, do you want to discuss one?      Do you have signed POA/HPOA for forms?                    Functional Status Survey:    Family History  Problem Relation Age of Onset  . Cancer Mother        lung and breast  . Heart disease Father        MI  . Cancer Sister        pancreatic    Health Maintenance  Topic Date Due  . PNA vac Low Risk Adult (1 of 2 - PCV13) 06/19/1992  . INFLUENZA VACCINE  01/26/2017  . TETANUS/TDAP  07/15/2025    Allergies  Allergen Reactions  . Aspirin   . Eggs Or Egg-Derived Products Swelling    throat    Outpatient Encounter Prescriptions as of 03/22/2017  Medication Sig  . cholecalciferol (VITAMIN D) 400 units TABS  tablet Take 400 Units by mouth 2 (two) times daily.  . clopidogrel (PLAVIX) 75 MG  tablet Take 75 mg by mouth daily. Start PLAVIX from 07/24/15 if no further hematuria is reported by patient and HB > 12.  . digoxin (LANOXIN) 0.125 MG tablet Take 0.125 mg by mouth daily.  Marland Kitchen dutasteride (AVODART) 0.5 MG capsule Take 0.5 mg by mouth daily.   Marland Kitchen levothyroxine (SYNTHROID, LEVOTHROID) 25 MCG tablet Take one tablet each morning before breakfast for thyroid  . NITROSTAT 0.4 MG SL tablet Place 0.4 mg under the tongue every 5 (five) minutes as needed for chest pain.   . polyethylene glycol (MIRALAX / GLYCOLAX) packet Take 17 g by mouth every other day.  . pravastatin (PRAVACHOL) 20 MG tablet Take 20 mg by mouth every evening.   . triamterene-hydrochlorothiazide (DYAZIDE) 37.5-25 MG capsule Take 1 capsule by mouth daily.   No facility-administered encounter medications on file as of 03/22/2017.     Review of Systems  Constitutional: Negative for appetite change, chills, fatigue, fever and unexpected weight change.  HENT: Negative for dental problem, ear pain, hearing loss, sore throat and trouble swallowing.   Eyes: Positive for visual disturbance.       Has corrective glasses. Has not seen his eye doctor since 2017.  Respiratory: Negative for shortness of breath.        Occasional cough triggered by spicy food  Cardiovascular: Positive for chest pain. Negative for palpitations and leg swelling.       Occasional chest pain and nitroglycerin helps. Last chest pain a month back.   Gastrointestinal: Negative for abdominal pain, blood in stool, constipation, diarrhea, nausea and vomiting.  Genitourinary: Positive for frequency. Negative for dysuria and hematuria.       Wakes up once at night to urinate. Has history of BPH  Musculoskeletal: Positive for gait problem.       Poor balance. Last fall a year and half back. Uses walker to transfer but otherwise uses wheelchair.  Skin: Negative for rash and wound.  Neurological: Positive for dizziness. Negative for seizures and headaches.        Sudden position change causes dizziness.  Hematological: Bruises/bleeds easily.  Psychiatric/Behavioral: Negative for sleep disturbance. The patient is nervous/anxious.     Vitals:   03/22/17 1125  BP: 116/80  Pulse: 77  Resp: 18  Temp: 97.7 F (36.5 C)  TempSrc: Oral  SpO2: 97%  Weight: 142 lb 12.8 oz (64.8 kg)  Height: _0  (1.854 m)   Body mass index is 18.84 kg/m.   Wt Readings from Last 3 Encounters:  03/22/17 142 lb 12.8 oz (64.8 kg)  09/23/16 152 lb (68.9 kg)  08/19/16 151 lb 9.6 oz (68.8 kg)    Physical Exam  Constitutional: He is oriented to person, place, and time. He appears well-developed. No distress.  Elderly male, thin built  HENT:  Head: Normocephalic and atraumatic.  Mouth/Throat: Oropharynx is clear and moist.  Eyes: Pupils are equal, round, and reactive to light. Conjunctivae and EOM are normal. Right eye exhibits no discharge. Left eye exhibits no discharge.  Has corrective glasses  Neck: Neck supple. No thyromegaly present.  Cardiovascular: Normal rate and regular rhythm.   No murmur heard. Has PVCs  Pulmonary/Chest: Effort normal and breath sounds normal. No respiratory distress. He has no wheezes. He has no rales.  Abdominal: Soft. Bowel sounds are normal. He exhibits no distension. There is no tenderness. There is no rebound and no guarding.  Musculoskeletal: He  exhibits edema.  Trace leg edema. Can move all 4 extremities. On wheelchair  Lymphadenopathy:    He has no cervical adenopathy.  Neurological: He is alert and oriented to person, place, and time.  Needs redirection and tends to deviate from topics.   Skin: Skin is warm and dry. No rash noted. He is not diaphoretic.  Bruising to both arms  Psychiatric:  Anxious somewhat, hyperfixated on certain topics during conversation    Labs reviewed: Basic Metabolic Panel:  Recent Labs  09/02/16 0000  NA 140  K 4.1  CL 101  CO2 34*  GLUCOSE 95  BUN 35*  CREATININE 1.21*  CALCIUM 9.4    Liver Function Tests:  Recent Labs  09/02/16 0000  AST 13  ALT 10  ALKPHOS 65  BILITOT 0.6  PROT 6.6  ALBUMIN 3.9   No results for input(s): LIPASE, AMYLASE in the last 8760 hours. No results for input(s): AMMONIA in the last 8760 hours. CBC:  Recent Labs  09/02/16 0000  WBC 5.5  HGB 14.3  HCT 41.4  MCV 87.3  PLT 226   Cardiac Enzymes: No results for input(s): CKTOTAL, CKMB, CKMBINDEX, TROPONINI in the last 8760 hours. BNP: Invalid input(s): POCBNP No results found for: HGBA1C Lab Results  Component Value Date   TSH 5.23 (H) 09/02/2016   No results found for: VITAMINB12 No results found for: FOLATE No results found for: IRON, TIBC, FERRITIN  Lipid Panel: No results for input(s): CHOL, HDL, LDLCALC, TRIG, CHOLHDL, LDLDIRECT in the last 8760 hours. No results found for: HGBA1C  Procedures since last visit: No results found.  Assessment/Plan  1. Essential hypertension Continue triamterene-hctz 37.5-25 mg daily.  - CMP with eGFR; Future - Lipid Panel; Future - TSH; Future - CBC with Differential/Platelets; Future  2. PVD (peripheral vascular disease) (HCC) Keep legs elevated at rest. Continue skin care.   3. Benign prostatic hyperplasia without lower urinary tract symptoms Continue dutasteride and monitor.   4. History of CVA (cerebrovascular accident) Continue pravastatin and plavix. Check lipid panel.   5. Hyperlipidemia LDL goal <70 Continue pravastatin 20 mg daily.  - Lipid Panel; Future  6. PVC (premature ventricular contraction) Continue digoxin. Check digoxin level.  - TSH; Future  7. Vitamin D deficiency Continue vitamin d supplement, check level. Fall precautions.  - Vitamin D, 25-hydroxy; Future  8. Acquired hypothyroidism Continue levothyroxine and monitor TSH.  - TSH; Future  9. Weight loss Has lost 10 lbs over last 6 months. Per pt this is intentional as he thought his blood sugar was running high and cut down on his calorie  intake. Appetite is good. No GI symptom. Regular bowel movement. Reassess in 3 months. Reassured him that his sugar is fine. Encouraged to increase calorie intake and to do weight bearing exercise to prevent muscle wasting and deconditioning.     Labs/tests ordered: as above  Next appointment: 3 months  Communication: reviewed care plan with patient   Blanchie Serve, MD Internal Medicine [place], [place] [address] Cell Phone (Monday-Friday 8 am - 5 pm): [phone] On Call: [phone] and follow prompts after 5 pm and on weekends Office Phone: [phone] Office Fax: [phone]

## 2017-03-24 DIAGNOSIS — I10 Essential (primary) hypertension: Secondary | ICD-10-CM | POA: Diagnosis not present

## 2017-03-24 DIAGNOSIS — E559 Vitamin D deficiency, unspecified: Secondary | ICD-10-CM | POA: Diagnosis not present

## 2017-03-24 DIAGNOSIS — I493 Ventricular premature depolarization: Secondary | ICD-10-CM | POA: Diagnosis not present

## 2017-03-24 DIAGNOSIS — E039 Hypothyroidism, unspecified: Secondary | ICD-10-CM | POA: Diagnosis not present

## 2017-03-24 DIAGNOSIS — E785 Hyperlipidemia, unspecified: Secondary | ICD-10-CM | POA: Diagnosis not present

## 2017-03-24 LAB — CBC WITH DIFFERENTIAL/PLATELET
BASOS ABS: 62 {cells}/uL (ref 0–200)
Basophils Relative: 1.1 %
EOS ABS: 605 {cells}/uL — AB (ref 15–500)
Eosinophils Relative: 10.8 %
HEMATOCRIT: 43.6 % (ref 38.5–50.0)
HEMOGLOBIN: 15 g/dL (ref 13.2–17.1)
LYMPHS ABS: 1915 {cells}/uL (ref 850–3900)
MCH: 30.4 pg (ref 27.0–33.0)
MCHC: 34.4 g/dL (ref 32.0–36.0)
MCV: 88.4 fL (ref 80.0–100.0)
MPV: 10.8 fL (ref 7.5–12.5)
Monocytes Relative: 8.2 %
NEUTROS ABS: 2559 {cells}/uL (ref 1500–7800)
Neutrophils Relative %: 45.7 %
Platelets: 215 10*3/uL (ref 140–400)
RBC: 4.93 10*6/uL (ref 4.20–5.80)
RDW: 13.7 % (ref 11.0–15.0)
Total Lymphocyte: 34.2 %
WBC: 5.6 10*3/uL (ref 3.8–10.8)
WBCMIX: 459 {cells}/uL (ref 200–950)

## 2017-03-25 LAB — COMPLETE METABOLIC PANEL WITH GFR
AG Ratio: 1.4 (calc) (ref 1.0–2.5)
ALBUMIN MSPROF: 3.9 g/dL (ref 3.6–5.1)
ALT: 12 U/L (ref 9–46)
AST: 14 U/L (ref 10–35)
Alkaline phosphatase (APISO): 54 U/L (ref 40–115)
BUN / CREAT RATIO: 29 (calc) — AB (ref 6–22)
BUN: 31 mg/dL — ABNORMAL HIGH (ref 7–25)
CALCIUM: 9.6 mg/dL (ref 8.6–10.3)
CO2: 34 mmol/L — AB (ref 20–32)
Chloride: 100 mmol/L (ref 98–110)
Creat: 1.08 mg/dL (ref 0.70–1.11)
GFR, EST NON AFRICAN AMERICAN: 61 mL/min/{1.73_m2} (ref >=60)
GFR, Est African American: 70 mL/min/{1.73_m2} (ref >=60)
GLOBULIN: 2.7 g/dL (ref 1.9–3.7)
Glucose, Bld: 94 mg/dL (ref 65–99)
Potassium: 4.3 mmol/L (ref 3.5–5.3)
SODIUM: 139 mmol/L (ref 135–146)
Total Bilirubin: 0.7 mg/dL (ref 0.2–1.2)
Total Protein: 6.6 g/dL (ref 6.1–8.1)

## 2017-03-25 LAB — LIPID PANEL
CHOLESTEROL: 125 mg/dL (ref <200)
HDL: 44 mg/dL (ref >40)
LDL Cholesterol (Calc): 65 mg/dL (calc)
NON-HDL CHOLESTEROL (CALC): 81 mg/dL (ref <130)
Total CHOL/HDL Ratio: 2.8 (calc) (ref <5.0)
Triglycerides: 84 mg/dL (ref <150)

## 2017-03-25 LAB — DIGOXIN LEVEL: Digoxin Level: 0.8 mcg/L (ref 0.8–2.0)

## 2017-03-25 LAB — TSH: TSH: 5.27 mIU/L — ABNORMAL HIGH (ref 0.40–4.50)

## 2017-03-25 LAB — VITAMIN D 25 HYDROXY (VIT D DEFICIENCY, FRACTURES): VIT D 25 HYDROXY: 47 ng/mL (ref 30–100)

## 2017-04-07 ENCOUNTER — Telehealth: Payer: Self-pay | Admitting: *Deleted

## 2017-04-07 NOTE — Telephone Encounter (Signed)
Patient is calling requesting his results from his bloodwork done on 03/24/2017. Patient stated that he hasn't heard anything and wonders if everything is ok. Patient would like a call back. [phone]

## 2017-04-07 NOTE — Telephone Encounter (Signed)
Your thyroid function result shows high TSH suggestive of underactive thyroid. I would recommend your levothyroxine be increased to 50 mcg daily. Please send a new script for levothyroxine to patient's pharmacy of choice for 90 days supply. Please provide a copy of lab result to patient. His lab work is otherwise overall stable. Please put in for recheck of TSH and free T4 in 8 weeks.

## 2017-04-07 NOTE — Telephone Encounter (Signed)
Spoke with the patient and verbalized understanding Dr. Amie Critchley instructions. Called his pharmacy and gave them a verbal order for new dose of Levothyroxine. I also scheduled the patient's new lab appointment with him on the phone.

## 2017-04-19 DIAGNOSIS — D1801 Hemangioma of skin and subcutaneous tissue: Secondary | ICD-10-CM | POA: Diagnosis not present

## 2017-04-19 DIAGNOSIS — L821 Other seborrheic keratosis: Secondary | ICD-10-CM | POA: Diagnosis not present

## 2017-04-19 DIAGNOSIS — L565 Disseminated superficial actinic porokeratosis (DSAP): Secondary | ICD-10-CM | POA: Diagnosis not present

## 2017-04-19 DIAGNOSIS — L570 Actinic keratosis: Secondary | ICD-10-CM | POA: Diagnosis not present

## 2017-04-19 DIAGNOSIS — Z85820 Personal history of malignant melanoma of skin: Secondary | ICD-10-CM | POA: Diagnosis not present

## 2017-04-19 DIAGNOSIS — Z85828 Personal history of other malignant neoplasm of skin: Secondary | ICD-10-CM | POA: Diagnosis not present

## 2017-04-26 ENCOUNTER — Telehealth: Payer: Self-pay | Admitting: *Deleted

## 2017-04-26 NOTE — Telephone Encounter (Signed)
Patient is calling requesting to speak with [place]. Stated that he has not heard back from his labwork. Message dated 04/07/2017 stated that he received his results, but patient is insisting on speaking with [place]. Also stated he would like a copy of these sent to him.  Would like a call [phone]

## 2017-05-23 ENCOUNTER — Other Ambulatory Visit: Payer: Self-pay

## 2017-05-23 DIAGNOSIS — I493 Ventricular premature depolarization: Secondary | ICD-10-CM

## 2017-05-23 DIAGNOSIS — E039 Hypothyroidism, unspecified: Secondary | ICD-10-CM

## 2017-05-23 DIAGNOSIS — E559 Vitamin D deficiency, unspecified: Secondary | ICD-10-CM

## 2017-05-23 DIAGNOSIS — I10 Essential (primary) hypertension: Secondary | ICD-10-CM

## 2017-05-30 ENCOUNTER — Telehealth: Payer: Self-pay | Admitting: Internal Medicine

## 2017-05-30 NOTE — Telephone Encounter (Signed)
I called the patient to schedule AWV-S with Clarise Cruz, but he declined.  He mentioned that he never received lab results from previous appointment. VDM (DD)

## 2017-06-01 DIAGNOSIS — I493 Ventricular premature depolarization: Secondary | ICD-10-CM | POA: Diagnosis not present

## 2017-06-01 DIAGNOSIS — I10 Essential (primary) hypertension: Secondary | ICD-10-CM | POA: Diagnosis not present

## 2017-06-01 DIAGNOSIS — E039 Hypothyroidism, unspecified: Secondary | ICD-10-CM | POA: Diagnosis not present

## 2017-06-02 NOTE — Telephone Encounter (Signed)
I called the patient about his lab work and gave him a copy. I will contact the patient today and have the [place] nurse give him a second copy.

## 2017-06-03 LAB — CBC WITH DIFFERENTIAL/PLATELET
BASOS PCT: 1.4 %
Basophils Absolute: 78 cells/uL (ref 0–200)
Eosinophils Absolute: 532 cells/uL — ABNORMAL HIGH (ref 15–500)
Eosinophils Relative: 9.5 %
HCT: 43.1 % (ref 38.5–50.0)
HEMOGLOBIN: 14.5 g/dL (ref 13.2–17.1)
LYMPHS ABS: 1826 {cells}/uL (ref 850–3900)
MCH: 30.2 pg (ref 27.0–33.0)
MCHC: 33.6 g/dL (ref 32.0–36.0)
MCV: 89.8 fL (ref 80.0–100.0)
MPV: 11 fL (ref 7.5–12.5)
Monocytes Relative: 8.3 %
NEUTROS ABS: 2699 {cells}/uL (ref 1500–7800)
Neutrophils Relative %: 48.2 %
PLATELETS: 199 10*3/uL (ref 140–400)
RBC: 4.8 10*6/uL (ref 4.20–5.80)
RDW: 12.6 % (ref 11.0–15.0)
TOTAL LYMPHOCYTE: 32.6 %
WBC: 5.6 10*3/uL (ref 3.8–10.8)
WBCMIX: 465 {cells}/uL (ref 200–950)

## 2017-06-03 LAB — LIPID PANEL
Cholesterol: 125 mg/dL
HDL: 48 mg/dL
LDL Cholesterol (Calc): 63 mg/dL
Non-HDL Cholesterol (Calc): 77 mg/dL
Total CHOL/HDL Ratio: 2.6 (calc)
Triglycerides: 64 mg/dL

## 2017-06-03 LAB — COMPLETE METABOLIC PANEL WITH GFR
AG Ratio: 1.3 (calc) (ref 1.0–2.5)
ALKALINE PHOSPHATASE (APISO): 56 U/L (ref 40–115)
ALT: 11 U/L (ref 9–46)
AST: 17 U/L (ref 10–35)
Albumin: 3.6 g/dL (ref 3.6–5.1)
BUN/Creatinine Ratio: 27 (calc) — ABNORMAL HIGH (ref 6–22)
BUN: 32 mg/dL — AB (ref 7–25)
CALCIUM: 9.3 mg/dL (ref 8.6–10.3)
CO2: 29 mmol/L (ref 20–32)
Chloride: 102 mmol/L (ref 98–110)
Creat: 1.19 mg/dL — ABNORMAL HIGH (ref 0.70–1.11)
GFR, EST NON AFRICAN AMERICAN: 54 mL/min/{1.73_m2} — AB (ref >=60)
GFR, Est African American: 62 mL/min/{1.73_m2} (ref >=60)
GLUCOSE: 82 mg/dL (ref 65–99)
Globulin: 2.7 g/dL (calc) (ref 1.9–3.7)
Potassium: 4.4 mmol/L (ref 3.5–5.3)
SODIUM: 141 mmol/L (ref 135–146)
Total Bilirubin: 0.7 mg/dL (ref 0.2–1.2)
Total Protein: 6.3 g/dL (ref 6.1–8.1)

## 2017-06-03 LAB — DIGOXIN LEVEL: Digoxin Level: 0.6 mcg/L — ABNORMAL LOW (ref 0.8–2.0)

## 2017-06-03 LAB — TSH: TSH: 3.9 m[IU]/L (ref 0.40–4.50)

## 2017-06-14 ENCOUNTER — Encounter: Payer: Self-pay | Admitting: Internal Medicine

## 2017-06-14 ENCOUNTER — Non-Acute Institutional Stay: Payer: Medicare Other | Admitting: Internal Medicine

## 2017-06-14 VITALS — BP 118/64 | HR 63 | Temp 97.5°F | Resp 18 | Ht 73.0 in | Wt 150.6 lb

## 2017-06-14 DIAGNOSIS — E559 Vitamin D deficiency, unspecified: Secondary | ICD-10-CM | POA: Diagnosis not present

## 2017-06-14 DIAGNOSIS — N183 Chronic kidney disease, stage 3 unspecified: Secondary | ICD-10-CM

## 2017-06-14 DIAGNOSIS — E039 Hypothyroidism, unspecified: Secondary | ICD-10-CM

## 2017-06-14 DIAGNOSIS — E785 Hyperlipidemia, unspecified: Secondary | ICD-10-CM

## 2017-06-14 DIAGNOSIS — Z8673 Personal history of transient ischemic attack (TIA), and cerebral infarction without residual deficits: Secondary | ICD-10-CM | POA: Diagnosis not present

## 2017-06-14 DIAGNOSIS — R2681 Unsteadiness on feet: Secondary | ICD-10-CM | POA: Diagnosis not present

## 2017-06-14 DIAGNOSIS — I739 Peripheral vascular disease, unspecified: Secondary | ICD-10-CM

## 2017-06-14 NOTE — Progress Notes (Signed)
[facility]  Provider: Blanchie Serve MD   Location:  [place] of Service:  Clinic (12)  PCP: Blanchie Serve, MD Patient Care Team: Blanchie Serve, MD as PCP - General (Internal Medicine) Danella Sensing, MD as Consulting Physician (Dermatology) Mast, Man X, NP as Nurse Practitioner (Internal Medicine)  Extended Emergency Contact Information Primary Emergency Contact: Roberg,Bill Address: [place] [address]          [place], [place] [address] Johnnette Litter of [place] Phone: [phone] Mobile Phone: [phone] Relation: Brother Secondary Emergency Contact: [place] Mobile Phone: [phone] Relation: Relative   Goals of Care: Advanced Directive information Advanced Directives 09/23/2016  Does Patient Have a Medical Advance Directive? Yes  Type of Paramedic of [place];Out of facility DNR (pink MOST or yellow form)  Does patient want to make changes to medical advance directive? -  Copy of [place] in Chart? Yes  Pre-existing out of facility DNR order (yellow form or pink MOST form) Yellow form placed in chart (order not valid for inpatient use)      Chief Complaint  Patient presents with  . Medical Management of Chronic Issues    3 month follow up. No concerns at this time.   . Medication Refill    No refills needed at this time.   Marland Kitchen Results    Discuss labs    HPI: Patient is a 83 y.o. male seen today for routine visit.   Unsteady gait- poor balance. uses wheelchair for long distance. Uses a walker for short distance. No fall reported.   PVD- with chronic leg edema. HCTZ- triamterene has been helpful.  Hypothyroidism- has TSH checked recently. Currently on levothyroxine 50 mcg daily and TSH reviewed. Tolerating increased dosing of levothyroxine well.   History of CVA- currently on daily plavix and pravastatin 20 mg daily uses wheelchair to  ambulate.   Vitamin d def- currently on vitamin d 400 u bid. No fall reported.   Hyperlipidemia- currently on pravastatin 20 mg daily.    Past Medical History:  Diagnosis Date  . Anginal pain ([place])    nuclear stress 03/12/13 EPIC  . Balance problem 07/17/2015  . Basal cell carcinoma    left forearm  . BPH (benign prostatic hyperplasia)   . Cataract, nuclear 08/08/2014  . 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 Cervical spondylosis without myelopathy 08/22/2015  . Diverticulosis   . Diverticulosis of colon without hemorrhage 08/22/2015  . Edema 08/18/2015  . Fall 07/17/2015  . Gait disturbance 08/22/2015  . GERD (gastroesophageal reflux disease)    occurs rarely  . History of CVA (cerebrovascular accident) 07/17/2015  . HLD (hyperlipidemia) 07/17/2015  . Hyperlipidemia   . Hypertension   . Inguinal hernia   . Left carotid bruit   . Melanoma ([place])    left forearm  . Osteopenia   . Paroxysmal atrial fibrillation (HCC)   . Physical deconditioning 08/22/2015  . Retinal hemorrhage 04/24/2014  . Rosacea blepharoconjunctivitis 11/20/2011  . Stroke ([place]) 3/07   affected balance  . Tubular adenoma of colon 2007  . Weakness 07/17/2015   Past Surgical History:  Procedure Laterality Date  . COLONOSCOPY  2008  . EYE SURGERY Right    cataract extraction with IOL  . INGUINAL HERNIA REPAIR Right 03/15/2013   Procedure: HERNIA REPAIR INGUINAL ADULT;  Surgeon: Odis Hollingshead, MD;  Location: [facility];  Service: General;  Laterality: Right;  . INSERTION OF  MESH Right 03/15/2013   Procedure: INSERTION OF MESH;  Surgeon: Odis Hollingshead, MD;  Location: [facility];  Service: General;  Laterality: Right;  . left forearm melanoma    . PROSTATE BIOPSY    . TONSILLECTOMY  1933    reports that he quit smoking about 44 years ago. he has never used smokeless tobacco. He reports that he does not drink alcohol or use drugs. Social History   Socioeconomic History  . Marital status: Divorced    Spouse name:  Not on file  . Number of children: Not on file  . Years of education: Not on file  . Highest education level: Not on file  Social Needs  . Financial resource strain: Not on file  . Food insecurity - worry: Not on file  . Food insecurity - inability: Not on file  . Transportation needs - medical: Not on file  . Transportation needs - non-medical: Not on file  Occupational History  . Occupation: Retired Press photographer    Comment: Sales  Tobacco Use  . Smoking status: Former Smoker    Last attempt to quit: 11/05/1972    Years since quitting: 44.6  . Smokeless tobacco: Never Used  Substance and Sexual Activity  . Alcohol use: No    Comment:  3 ounces wine nightly  . Drug use: No  . Sexual activity: Not on file  Other Topics Concern  . Not on file  Social History Narrative   Diet? Regular/normal      Do you drink/eat things with caffeine? no      Marital status?      divorced                              What year were you married? 1957      Do you live in a house, apartment, assisted living, condo, trailer, etc.? Apartment. Moved to [place] 08/21/2015      Is it one or more stories? 2      How many persons live in your home? Live alone      Do you have any pets in your home? (please list) no      Current or past profession: Sales      Do you exercise?      "I do now."                            Type & how often? Exercise bike, 3 times weekly      Do you have a living will? no      Do you have a DNR form?    yes                              If not, do you want to discuss one?      Do you have signed POA/HPOA for forms?                 Functional Status Survey:    Family History  Problem Relation Age of Onset  . Cancer Mother        lung and breast  . Heart disease Father        MI  . Cancer Sister        pancreatic    Health Maintenance  Topic Date Due  . PNA vac Low Risk  Adult (1 of 2 - PCV13) 06/19/1992  . TETANUS/TDAP  07/15/2025  . INFLUENZA VACCINE   Discontinued    Allergies  Allergen Reactions  . Aspirin   . Eggs Or Egg-Derived Products Swelling    throat    Outpatient Encounter Medications as of 06/14/2017  Medication Sig  . cholecalciferol (VITAMIN D) 400 units TABS tablet Take 400 Units by mouth 2 (two) times daily.  . clopidogrel (PLAVIX) 75 MG tablet Take 75 mg by mouth daily. Start PLAVIX from 07/24/15 if no further hematuria is reported by patient and HB > 12.  . digoxin (LANOXIN) 0.125 MG tablet Take 0.125 mg by mouth daily.  Marland Kitchen dutasteride (AVODART) 0.5 MG capsule Take 0.5 mg by mouth daily.   Marland Kitchen levothyroxine (SYNTHROID, LEVOTHROID) 50 MCG tablet Take 50 mcg by mouth daily before breakfast.  . NITROSTAT 0.4 MG SL tablet Place 0.4 mg under the tongue every 5 (five) minutes as needed for chest pain.   . polyethylene glycol (MIRALAX / GLYCOLAX) packet Take 17 g by mouth as needed.   . pravastatin (PRAVACHOL) 20 MG tablet Take 20 mg by mouth every evening.   . triamterene-hydrochlorothiazide (DYAZIDE) 37.5-25 MG capsule Take 1 capsule by mouth daily.  . [DISCONTINUED] levothyroxine (SYNTHROID, LEVOTHROID) 25 MCG tablet Take one tablet each morning before breakfast for thyroid   No facility-administered encounter medications on file as of 06/14/2017.     Review of Systems  Constitutional: Negative for chills and fever.  HENT: Positive for postnasal drip. Negative for congestion, ear pain, mouth sores and trouble swallowing.   Respiratory: Negative for cough and shortness of breath.   Cardiovascular: Positive for leg swelling. Negative for chest pain and palpitations.       Hctz helps with his edema  Gastrointestinal: Negative for abdominal pain, diarrhea, nausea and vomiting.  Genitourinary: Negative for dysuria.       Has BPH, has urinary frequency  Musculoskeletal: Positive for gait problem.  Skin: Negative for rash and wound.  Neurological: Negative for dizziness, seizures and headaches.  Psychiatric/Behavioral:  Negative for confusion, dysphoric mood and sleep disturbance.    Vitals:   06/14/17 0927  BP: 118/64  Pulse: 63  Resp: 18  Temp: (!) 97.5 F (36.4 C)  TempSrc: Oral  SpO2: 97%  Weight: 150 lb 9.6 oz (68.3 kg)  Height: '6\' 1"'  (1.854 m)   Body mass index is 19.87 kg/m.   Wt Readings from Last 3 Encounters:  06/14/17 150 lb 9.6 oz (68.3 kg)  03/22/17 142 lb 12.8 oz (64.8 kg)  09/23/16 152 lb (68.9 kg)   Physical Exam  Constitutional: He is oriented to person, place, and time.  Thin built, frail, in no acute distress  HENT:  Head: Normocephalic and atraumatic.  Mouth/Throat: Oropharynx is clear and moist. No oropharyngeal exudate.  Eyes: Conjunctivae and EOM are normal. Pupils are equal, round, and reactive to light. Right eye exhibits no discharge. Left eye exhibits no discharge.  Neck: Normal range of motion. Neck supple.  Cardiovascular: Normal rate, regular rhythm and intact distal pulses.  Pulmonary/Chest: Effort normal and breath sounds normal. He has no wheezes. He has no rales.  Abdominal: Soft. Bowel sounds are normal. There is no tenderness.  Musculoskeletal: He exhibits deformity.  Has arthritis changes to fingers, trace leg edema   Lymphadenopathy:    He has no cervical adenopathy.  Neurological: He is alert and oriented to person, place, and time.  Skin: Skin is warm and dry. No rash noted. He  is not diaphoretic.  Psychiatric: He has a normal mood and affect.    Labs reviewed: Basic Metabolic Panel: Recent Labs    09/02/16 0000 03/24/17 0750 06/01/17 0000  NA 140 139 141  K 4.1 4.3 4.4  CL 101 100 102  CO2 34* 34* 29  GLUCOSE 95 94 82  BUN 35* 31* 32*  CREATININE 1.21* 1.08 1.19*  CALCIUM 9.4 9.6 9.3   Liver Function Tests: Recent Labs    09/02/16 0000 03/24/17 0750 06/01/17 0000  AST '13 14 17  ' ALT '10 12 11  ' ALKPHOS 65  --   --   BILITOT 0.6 0.7 0.7  PROT 6.6 6.6 6.3  ALBUMIN 3.9  --   --    No results for input(s): LIPASE, AMYLASE in  the last 8760 hours. No results for input(s): AMMONIA in the last 8760 hours. CBC: Recent Labs    09/02/16 0000 03/24/17 0000 06/01/17 0000  WBC 5.5 5.6 5.6  NEUTROABS  --  2,559 2,699  HGB 14.3 15.0 14.5  HCT 41.4 43.6 43.1  MCV 87.3 88.4 89.8  PLT 226 215 199   Cardiac Enzymes: No results for input(s): CKTOTAL, CKMB, CKMBINDEX, TROPONINI in the last 8760 hours. BNP: Invalid input(s): POCBNP No results found for: HGBA1C Lab Results  Component Value Date   TSH 3.90 06/01/2017   No results found for: VITAMINB12 No results found for: FOLATE No results found for: IRON, TIBC, FERRITIN  Lipid Panel: Recent Labs    03/24/17 0750 06/01/17 0000  CHOL 125 125  HDL 44 48  TRIG 84 64  CHOLHDL 2.8 2.6   No results found for: HGBA1C  Procedures since last visit: No results found.  Assessment/Plan  1. Acquired hypothyroidism Reviewed TSH. Continue levothyroxine.  2. Vitamin D deficiency Normal vit d level of 47 in 03/24/17. Continue vit d supplement  3. Unsteady gait Continue to use walker and wheelchair. Fall precautions.   4. History of CVA (cerebrovascular accident) Continue plavix and statin, reviewed lipid panel.   5. PVD (peripheral vascular disease) (HCC) Continue hctz triamterene for now. Reviewed BMP, stable edema.   6. Hyperlipidemia LDL goal <70 Lipid Panel     Component Value Date/Time   CHOL 125 06/01/2017 0000   TRIG 64 06/01/2017 0000   HDL 48 06/01/2017 0000   CHOLHDL 2.6 06/01/2017 0000   LDLCALC 53 08/26/2015   C/w pravastatin 20 mg daily and monitor.     Labs/tests ordered:   Orders Placed This Encounter  Procedures  . CMP with eGFR    Standing Status:   Future    Standing Expiration Date:   01/12/2018  . Lipid Panel    Standing Status:   Future    Standing Expiration Date:   01/12/2018  . CBC (no diff)    Standing Status:   Future    Standing Expiration Date:   01/12/2018  . TSH    Standing Status:   Future    Standing  Expiration Date:   01/12/2018    Next appointment: 6 months physical  Communication: reviewed care plan with patient.     Blanchie Serve, MD Internal Medicine [facility] [address] [place], [place] [address] Cell Phone (Monday-Friday 8 am - 5 pm): [phone] On Call: [phone] and follow prompts after 5 pm and on weekends Office Phone: [phone] Office Fax: [phone]

## 2017-08-16 ENCOUNTER — Telehealth: Payer: Self-pay

## 2017-08-16 NOTE — Telephone Encounter (Signed)
I offered an appointment with [facility] on Thursday or an appointment in the office in the morning. Patient declined both. Stated that he only needed to be seen for 2 minutes today.

## 2017-08-16 NOTE — Telephone Encounter (Signed)
Spoke with the patient and he stated that its his ankle that's bothering him. He stated that when he wheels himself around his apartment that his ankle is hitting the axle of the wheelchair. I offered him an appointment to see [facility], NP on Thursday to be evaluated and that Dr. Bubba Camp would be notified about how the visit went. Patient was more than happy see the Manxie. Appointment has been scheduled for Thursday 08/18/17 at 2 pm. Patient is aware of appointment and time.

## 2017-08-16 NOTE — Telephone Encounter (Signed)
I spoke with patient and he state that he spoke with [place] nurse the other day about his foot. He stated that he did not think that nurse was able to give him advise or recognize that he needed assistance. He stated that he was unsatisfied with the lack of care he is receiving. He then stated that he would seek other treatment.

## 2017-08-16 NOTE — Telephone Encounter (Signed)
Patient needs to be seen in the clinic. Nursing staff for [place] should be able to bring patient to the clinic on a wheelchair if needed. I wont be able to provide recommendation until we see him. ManXie should be able to see him this coming Thursday.

## 2017-08-16 NOTE — Telephone Encounter (Signed)
Per Dr. Bubba Camp patient needs to call [place] Nurse to be evaluated.

## 2017-08-16 NOTE — Telephone Encounter (Signed)
Received a call from [place] stating that Cody [name] called the office saying that he injured his foot and can barely stand, move or put pressure on it.   Please advise

## 2017-08-18 ENCOUNTER — Encounter: Payer: Self-pay | Admitting: Nurse Practitioner

## 2017-08-18 ENCOUNTER — Non-Acute Institutional Stay: Payer: Medicare Other | Admitting: Nurse Practitioner

## 2017-08-18 DIAGNOSIS — L89512 Pressure ulcer of right ankle, stage 2: Secondary | ICD-10-CM

## 2017-08-18 DIAGNOSIS — I10 Essential (primary) hypertension: Secondary | ICD-10-CM | POA: Diagnosis not present

## 2017-08-18 DIAGNOSIS — I493 Ventricular premature depolarization: Secondary | ICD-10-CM

## 2017-08-18 NOTE — Assessment & Plan Note (Addendum)
About less than 0.60mm stage 2 pressure ulcer on the top of the right lateral malleolus, not infection, off pressure is must, apply hydrocolloid AG dressing q 3 days. Tylenol 650mg  qh for pain. Observe.

## 2017-08-18 NOTE — Progress Notes (Signed)
Location:   [facility]   Place of Service:  Clinic (12) Provider: Marlana Latus NP  Code Status: DNR Goals of Care: [place]   Advanced Directives 09/23/2016  Does Patient Have a Medical Advance Directive? Yes  Type of Paramedic of [place];Out of facility DNR (pink MOST or yellow form)  Does patient want to make changes to medical advance directive? -  Copy of [place] in Chart? Yes  Pre-existing out of facility DNR order (yellow form or pink MOST form) Yellow form placed in chart (order not valid for inpatient use)     Chief Complaint  Patient presents with  . Acute Visit    Patient has some ankle pain on the right due to the wheelchair wheels in the front constantly bumpiing hid ankle. Patient mentioned that he only has pain when he's laying on his right side at night. Patient has a red sore on his right ankle. Patient has been putting vaseline on the sore on his ankle bone.     HPI: Patient is a 84 y.o. male seen today for an acute visit for the lateral right ankle pain. He stated it has been on and off since he bumped into wheelchair about 2 years ago. Pain when lay on the right side with lateral of the right ankle touches bed now. He has history of PVD, dependent rubber and elevation pallor is evident. He takes Dyazide 37.5/25 mg daily, Digoxin, Plavix.   Past Medical History:  Diagnosis Date  . Anginal pain ([place])    nuclear stress 03/12/13 EPIC  . Balance problem 07/17/2015  . Basal cell carcinoma    left forearm  . BPH (benign prostatic hyperplasia)   . Cataract, nuclear 08/08/2014  . 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 Cervical spondylosis without myelopathy 08/22/2015  . Diverticulosis   . Diverticulosis of colon without hemorrhage 08/22/2015  . Edema 08/18/2015  . Fall 07/17/2015  . Gait disturbance 08/22/2015  . GERD (gastroesophageal reflux disease)    occurs rarely  . History of CVA (cerebrovascular accident) 07/17/2015  . HLD  (hyperlipidemia) 07/17/2015  . Hyperlipidemia   . Hypertension   . Inguinal hernia   . Left carotid bruit   . Melanoma ([place])    left forearm  . Osteopenia   . Paroxysmal atrial fibrillation (HCC)   . Physical deconditioning 08/22/2015  . Retinal hemorrhage 04/24/2014  . Rosacea blepharoconjunctivitis 11/20/2011  . Stroke ([place]) 3/07   affected balance  . Tubular adenoma of colon 2007  . Weakness 07/17/2015    Past Surgical History:  Procedure Laterality Date  . COLONOSCOPY  2008  . EYE SURGERY Right    cataract extraction with IOL  . INGUINAL HERNIA REPAIR Right 03/15/2013   Procedure: HERNIA REPAIR INGUINAL ADULT;  Surgeon: Odis Hollingshead, MD;  [facility]: [facility];  Service: General;  Laterality: Right;  . INSERTION OF MESH Right 03/15/2013   Procedure: INSERTION OF MESH;  Surgeon: Odis Hollingshead, MD;  [facility]: [facility];  Service: General;  Laterality: Right;  . left forearm melanoma    . PROSTATE BIOPSY    . TONSILLECTOMY  1933    Allergies  Allergen Reactions  . Aspirin   . Eggs Or Egg-Derived Products Swelling    throat    Allergies as of 08/18/2017      Reactions   Aspirin    Eggs Or Egg-derived Products Swelling   throat      Medication List  Accurate as of 08/18/17 11:59 PM. Always use your most recent med list.          cholecalciferol 400 units Tabs tablet Commonly known as:  VITAMIN D Take 400 Units by mouth 2 (two) times daily.   clopidogrel 75 MG tablet Commonly known as:  PLAVIX Take 75 mg by mouth daily. Start PLAVIX from 07/24/15 if no further hematuria is reported by patient and HB > 12.   dutasteride 0.5 MG capsule Commonly known as:  AVODART Take 0.5 mg by mouth daily.   LANOXIN 0.125 MG tablet Generic drug:  digoxin Take 0.125 mg by mouth daily.   levothyroxine 50 MCG tablet Commonly known as:  SYNTHROID, LEVOTHROID Take 50 mcg by mouth daily before breakfast.   NITROSTAT 0.4 MG SL tablet Generic drug:  nitroGLYCERIN Place  0.4 mg under the tongue every 5 (five) minutes as needed for chest pain.   polyethylene glycol packet Commonly known as:  MIRALAX / GLYCOLAX Take 17 g by mouth as needed.   pravastatin 20 MG tablet Commonly known as:  PRAVACHOL Take 20 mg by mouth every evening.   triamterene-hydrochlorothiazide 37.5-25 MG capsule Commonly known as:  DYAZIDE Take 1 capsule by mouth daily.       Review of Systems:  Review of Systems  Constitutional: Negative for activity change, appetite change, chills, diaphoresis, fatigue and fever.  HENT: Positive for hearing loss. Negative for congestion, trouble swallowing and voice change.   Respiratory: Negative for cough, choking and shortness of breath.   Cardiovascular: Positive for leg swelling. Negative for chest pain and palpitations.  Gastrointestinal: Negative for abdominal distention and abdominal pain.  Musculoskeletal: Positive for gait problem.  Skin: Positive for color change and wound.       The lateral right ankle pain and wound. Purplish redness in R+L foot.   Neurological: Negative for tremors, speech difficulty and weakness.  Psychiatric/Behavioral: Negative for agitation, behavioral problems, confusion, hallucinations and sleep disturbance. The patient is not nervous/anxious.     Health Maintenance  Topic Date Due  . PNA vac Low Risk Adult (1 of 2 - PCV13) 06/19/1992  . TETANUS/TDAP  07/15/2025  . INFLUENZA VACCINE  Discontinued    Physical Exam: Vitals:   08/18/17 1400  BP: 122/70  Pulse: 79  Resp: 16  Temp: (!) 97.5 F (36.4 C)  TempSrc: Oral  SpO2: 96%  Weight: 150 lb 6.4 oz (68.2 kg)  Height: 6\' 1"  (1.854 m)   Body mass index is 19.84 kg/m. Physical Exam  Constitutional: He is oriented to person, place, and time. He appears well-developed and well-nourished.  HENT:  Head: Normocephalic and atraumatic.  Eyes: Conjunctivae and EOM are normal. Pupils are equal, round, and reactive to light.  Neck: Normal range of  motion. Neck supple. No JVD present. No thyromegaly present.  Cardiovascular: Normal rate.  No murmur heard. Irregular heart beats. Faint pedis dorsalis pulses.   Pulmonary/Chest: Effort normal and breath sounds normal. He has no wheezes. He has no rales.  Abdominal: Bowel sounds are normal. He exhibits no distension. There is no tenderness.  Musculoskeletal: Normal range of motion. He exhibits edema and tenderness.  Trace edema in BLE. The lateral right ankle pain when palpated. Wheelchair to get around.   Neurological: He is alert and oriented to person, place, and time. He exhibits normal muscle tone. Coordination normal.  Skin: Skin is dry.  Brownish pigmentation BLE from mid shin down to ankles. Dependent rubber and elevation pallor noted in R+L ankle  and foot. A less than 0.13mm sized stage II pressure ulcer at the tip of right malleolus, no sign of infection. Cold to touch feet.   Psychiatric: He has a normal mood and affect. His behavior is normal. Judgment and thought content normal.    Labs reviewed: Basic Metabolic Panel: Recent Labs    09/02/16 0000 03/24/17 0750 06/01/17 0000  NA 140 139 141  K 4.1 4.3 4.4  CL 101 100 102  CO2 34* 34* 29  GLUCOSE 95 94 82  BUN 35* 31* 32*  CREATININE 1.21* 1.08 1.19*  CALCIUM 9.4 9.6 9.3  TSH 5.23* 5.27* 3.90   Liver Function Tests: Recent Labs    09/02/16 0000 03/24/17 0750 06/01/17 0000  AST 13 14 17   ALT 10 12 11   ALKPHOS 65  --   --   BILITOT 0.6 0.7 0.7  PROT 6.6 6.6 6.3  ALBUMIN 3.9  --   --    No results for input(s): LIPASE, AMYLASE in the last 8760 hours. No results for input(s): AMMONIA in the last 8760 hours. CBC: Recent Labs    09/02/16 0000 03/24/17 0000 06/01/17 0000  WBC 5.5 5.6 5.6  NEUTROABS  --  2,559 2,699  HGB 14.3 15.0 14.5  HCT 41.4 43.6 43.1  MCV 87.3 88.4 89.8  PLT 226 215 199   Lipid Panel: Recent Labs    03/24/17 0750 06/01/17 0000  CHOL 125 125  HDL 44 48  TRIG 84 64  CHOLHDL  2.8 2.6   No results found for: HGBA1C  Procedures since last visit: No results found.  Assessment/Plan Pressure ulcer of right ankle, stage 2 About less than 0.52mm stage 2 pressure ulcer on the top of the right lateral malleolus, not infection, off pressure is must, apply hydrocolloid AG dressing q 3 days. Tylenol 650mg  qh for pain. Observe.   HTN (hypertension) Blood pressure is controlled, continue Dyazide 37.5/25mg  daily.   PVC (premature ventricular contraction) Heart rate is in control, continue Digoxin     Labs/tests ordered:  None  Next appt:  12/13/2017  Time spend 25 minutes.

## 2017-08-18 NOTE — Assessment & Plan Note (Signed)
Heart rate is in control, continue Digoxin 

## 2017-08-18 NOTE — Assessment & Plan Note (Signed)
Blood pressure is controlled, continue Dyazide 37.5/25mg  daily.

## 2017-10-06 DIAGNOSIS — Z85820 Personal history of malignant melanoma of skin: Secondary | ICD-10-CM | POA: Diagnosis not present

## 2017-10-06 DIAGNOSIS — H61001 Unspecified perichondritis of right external ear: Secondary | ICD-10-CM | POA: Diagnosis not present

## 2017-10-06 DIAGNOSIS — L570 Actinic keratosis: Secondary | ICD-10-CM | POA: Diagnosis not present

## 2017-10-06 DIAGNOSIS — L98499 Non-pressure chronic ulcer of skin of other sites with unspecified severity: Secondary | ICD-10-CM | POA: Diagnosis not present

## 2017-10-06 DIAGNOSIS — L821 Other seborrheic keratosis: Secondary | ICD-10-CM | POA: Diagnosis not present

## 2017-10-06 DIAGNOSIS — Z85828 Personal history of other malignant neoplasm of skin: Secondary | ICD-10-CM | POA: Diagnosis not present

## 2017-10-19 IMAGING — CR DG CHEST 1V PORT
2 series · 2 of 2 positions shown · non-contrast
Comparison: Chest radiograph performed 03/13/2013

CLINICAL DATA: Acute onset of cough.  Initial encounter.

EXAM:
PORTABLE CHEST 1 VIEW

[AP (1 of 2)]
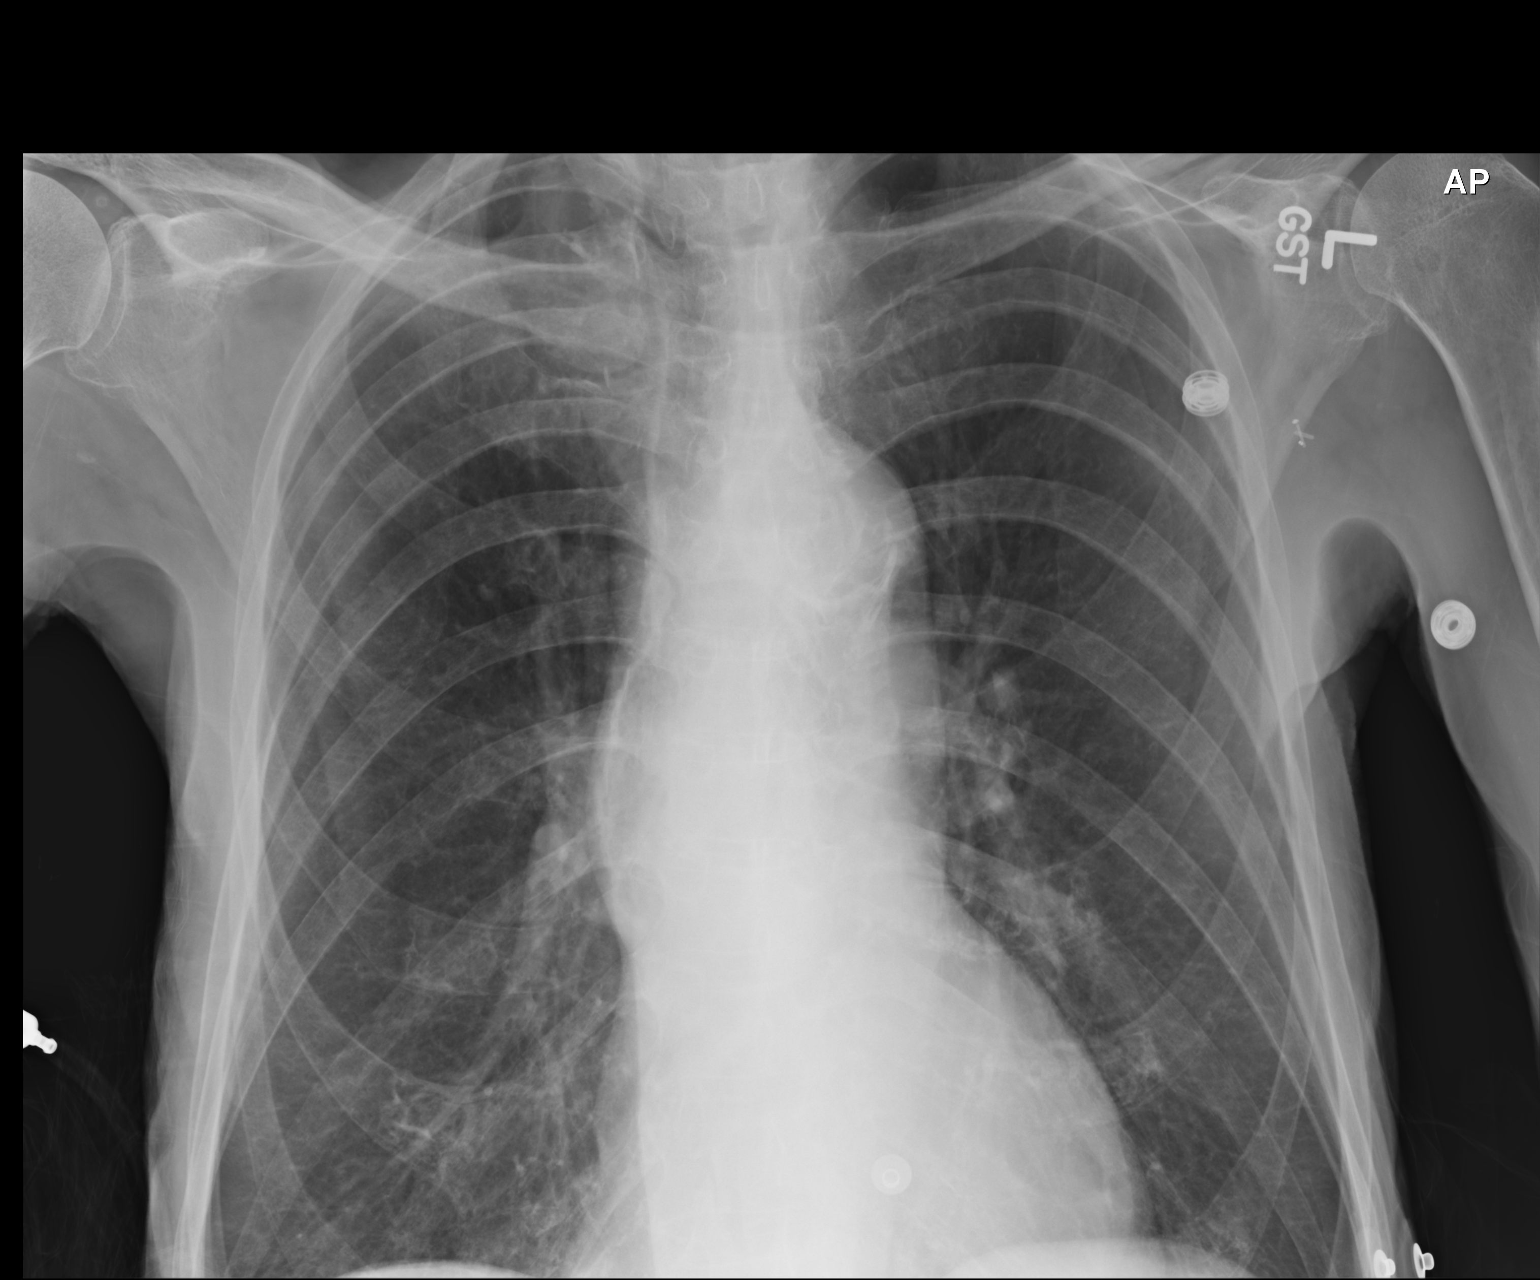

[AP (2 of 2)]
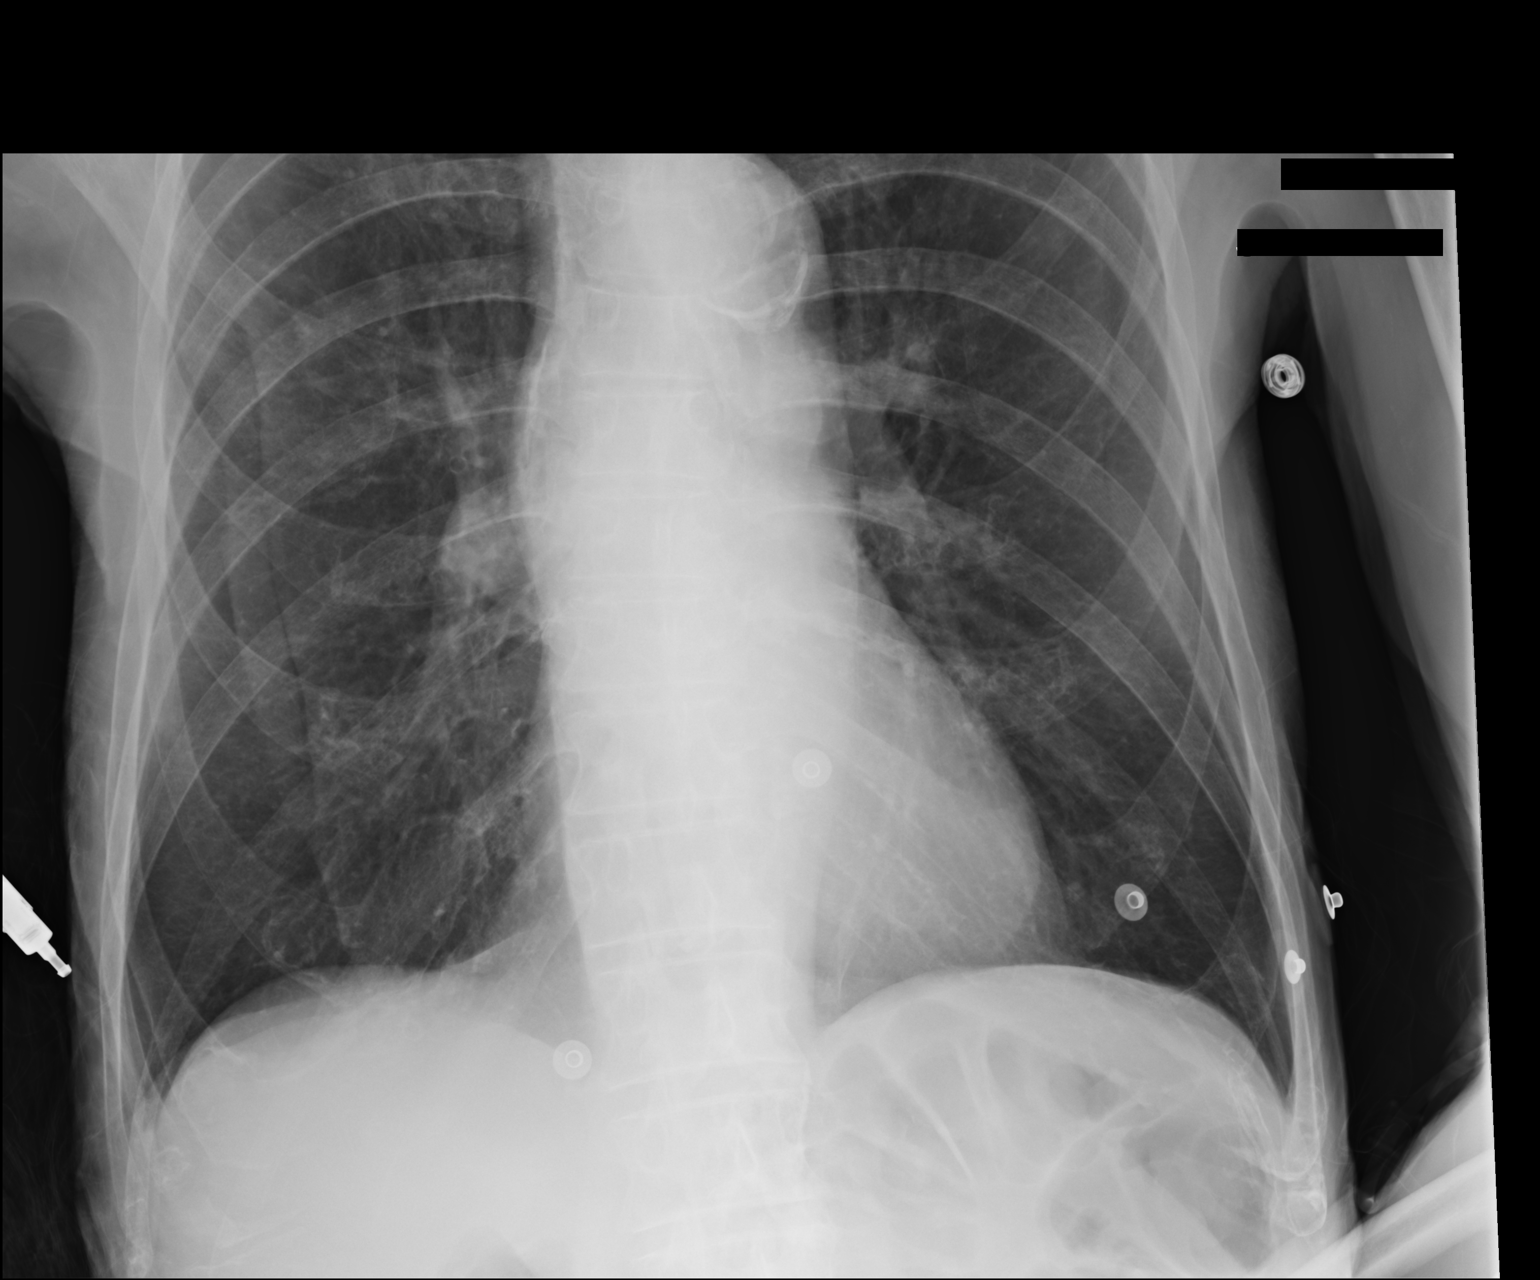

[2 of 2 positions shown; findings below may reference images not displayed]

FINDINGS: The lungs are hyperexpanded, with flattening of the hemidiaphragms,
likely reflecting COPD. There is no evidence of focal opacification,
pleural effusion or pneumothorax.

The cardiomediastinal silhouette is within normal limits. No acute
osseous abnormalities are seen. Clips are noted overlying the left
axilla.
IMPRESSION: Findings suggest COPD.  Lungs otherwise grossly clear.

## 2017-10-20 IMAGING — CT CT MAXILLOFACIAL W/O CM
3 series · 15 of 47 positions shown, 18 images · non-contrast
Comparison: 07/16/2015 CT head.

CLINICAL DATA: RIGHT-sided mouth pain.  Eight days ago onset.

EXAM:
CT MAXILLOFACIAL WITHOUT CONTRAST
TECHNIQUE: Multidetector CT imaging of the maxillofacial structures was
performed. Multiplanar CT image reconstructions were also generated.
A small metallic BB was placed on the right temple in order to
reliably differentiate right from left.

[Series 2: facialbone 2.0 st · axial · 0.32mm/px · z∈[-244,-94]mm · 9 of 89 slices shown, 12 images]
[im 7/89  brain]
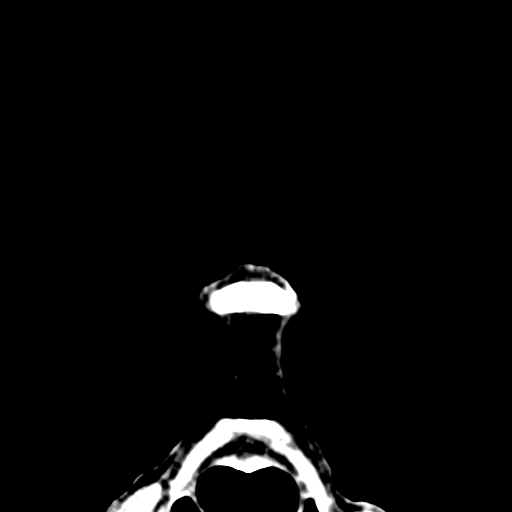
[im 7/89  bone]
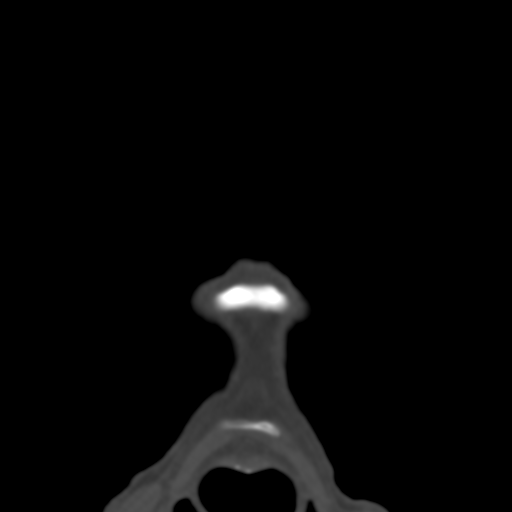
[im 16/89  bone]
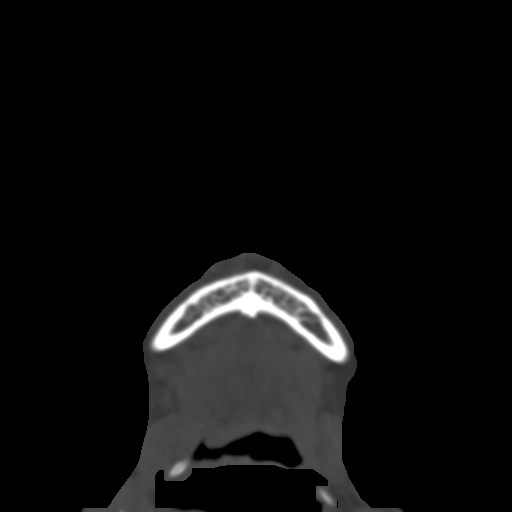
[im 25/89  bone]
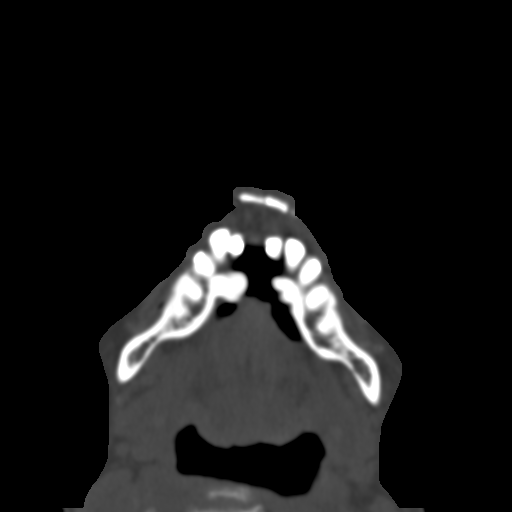
[im 34/89  bone]
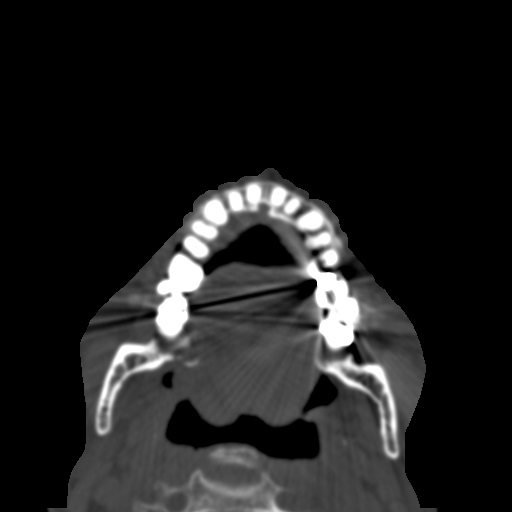
[im 46/89  brain]
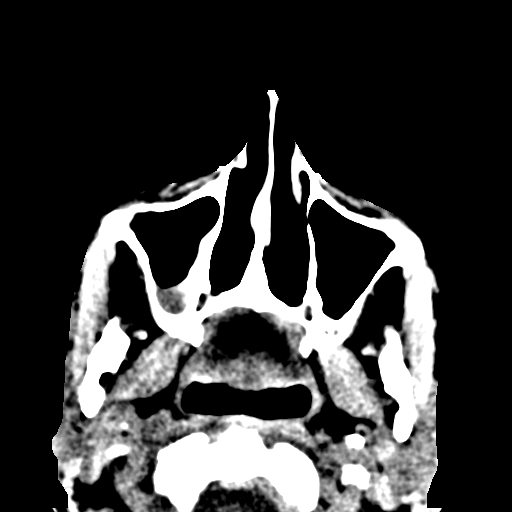
[im 46/89  bone]
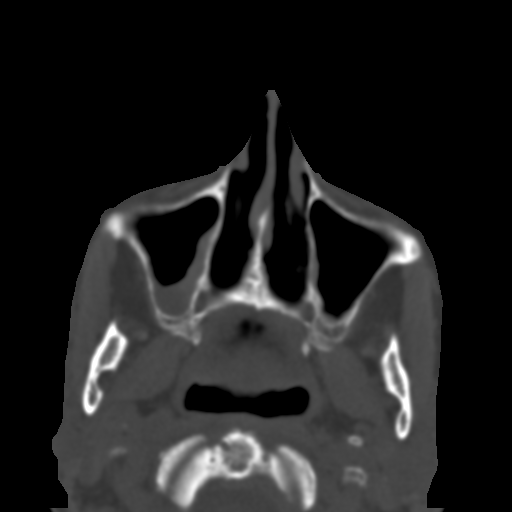
[im 55/89  bone]
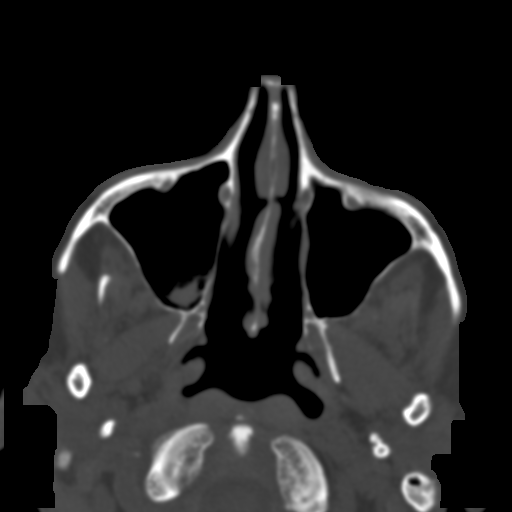
[im 64/89  bone]
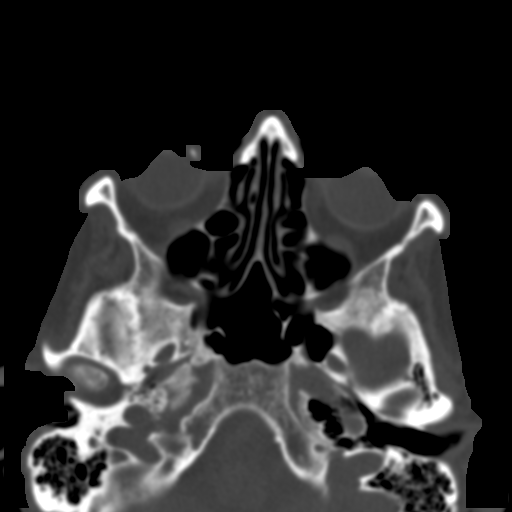
[im 73/89  bone]
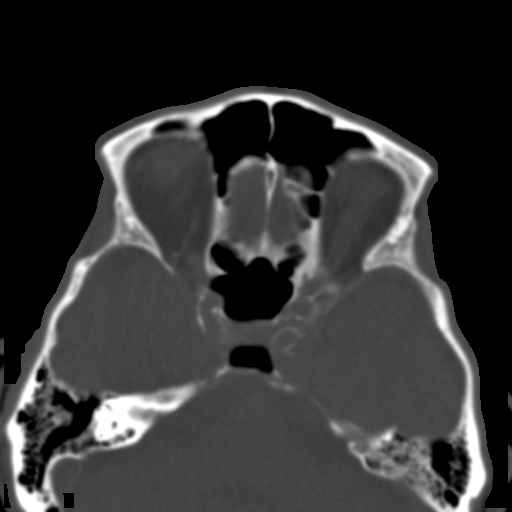
[im 82/89  brain]
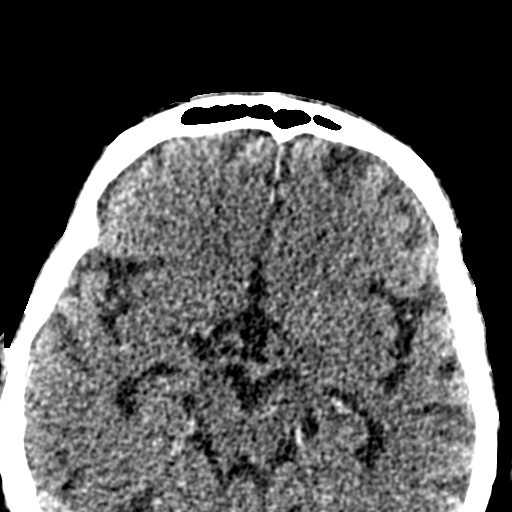
[im 82/89  bone]
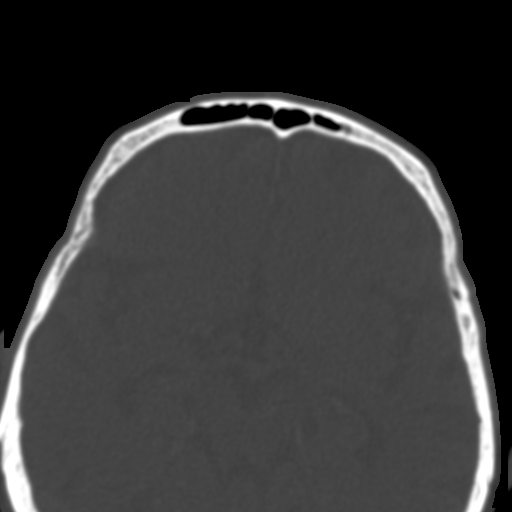

[Series 6: facialbone 2.0 cor st · coronal · 0.37mm/px · 3 of 70 slices shown]
[im 24/70  bone]
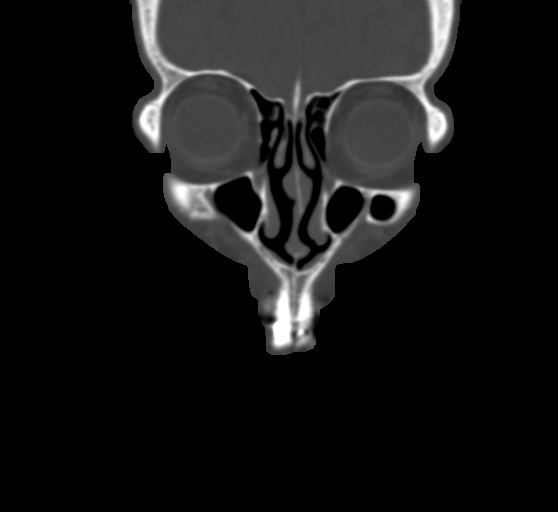
[im 31/70  bone]
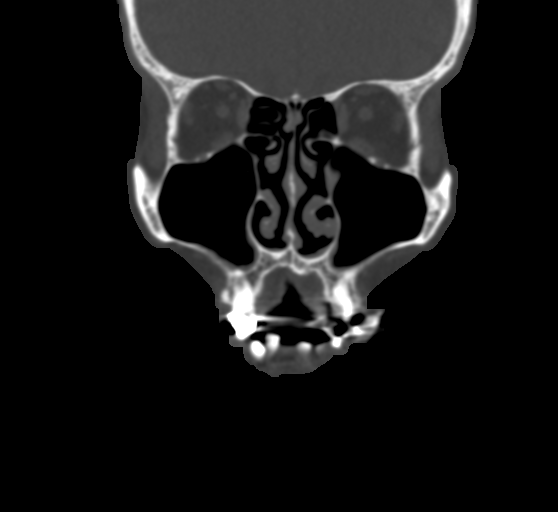
[im 39/70  bone]
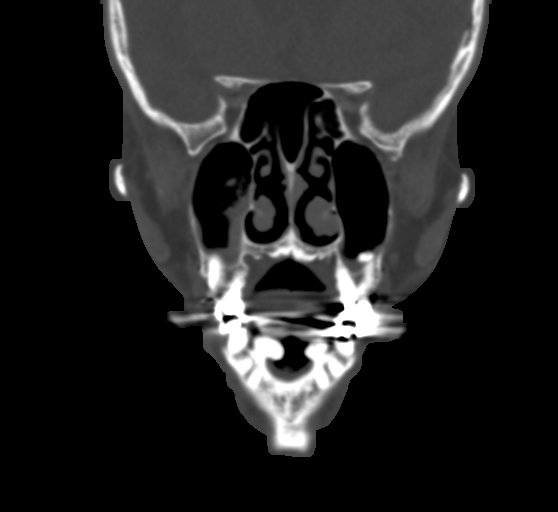

[Series 7: facialbone 2.0 sag st · sagittal · 0.29mm/px · 3 of 87 slices shown]
[im 29/87  bone]
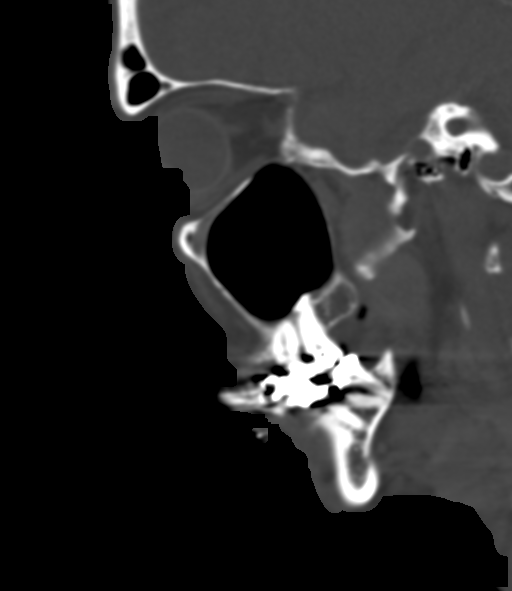
[im 44/87  bone]
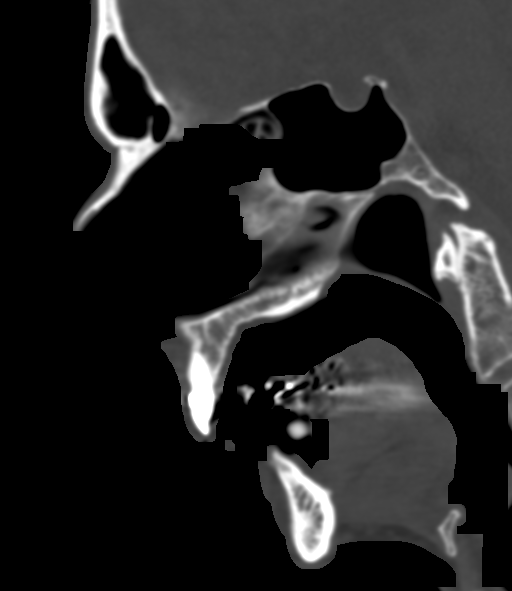
[im 58/87  bone]
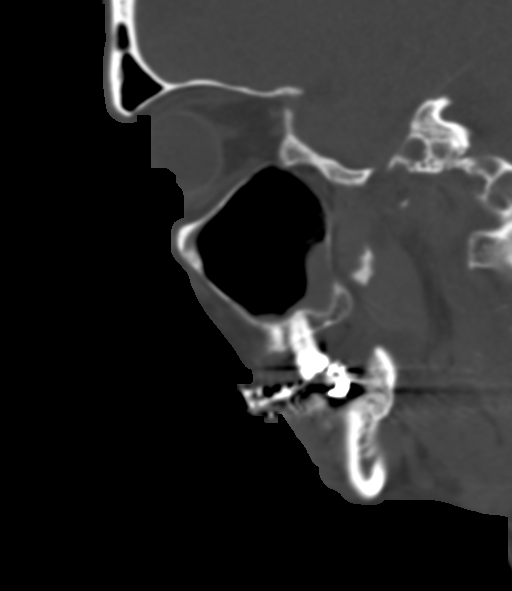

[15 of 47 positions shown; findings below may reference images not displayed]

FINDINGS: Dental amalgam and BILATERAL mandibular [REDACTED] obscure detail of the
oral cavity on the RIGHT. There does appear to be a lucency
surrounding the RIGHT second molar roots. The Mandibular cortex is
discontinuous posteriorly and medially, and there is a peripherally
calcified or peripherally ossified exostosis or walled off abscess
projecting medially. See for instance image 34 series 3, and image
37 series 5. Chronic periapical abscess is not excluded. Oral
surgery consultation is warranted.

No mandibular or maxillary fracture. Chronic RIGHT maxillary
sinusitis with layering fluid suggesting acuity superimposed. No
ethmoid, frontal, or sphenoid sinus fluid. Vascular calcification.
No orbital findings of significance. BILATERAL cataract extraction.
Limited views of the intracranial compartment show no definite
change from previous scan.
IMPRESSION: Lucency surrounding the RIGHT second molar roots. Medially
projecting exostosis or walled off abscess, with cortical
discontinuity of the posterior mandible. Chronic periapical abscess
is not excluded.

Chronic and acute RIGHT maxillary sinusitis.

## 2017-11-28 ENCOUNTER — Telehealth: Payer: Self-pay

## 2017-11-28 NOTE — Telephone Encounter (Signed)
Patient called requesting to speak with Cody [name], patient c/o penile pain off/on x few days. Patient denies discharge from penis, denies abdominal pain or lower back pain. Patient states pain is not present today, yet it concerns him, and he thinks he has a UTI.   Patient requested appointment with Dr.Pandey tomorrow, schedule is full  Please advise

## 2017-11-28 NOTE — Telephone Encounter (Signed)
Patient mentioned that he had some discomfort in the penile region last Wednesday. Patient mentioned that he hasn't had any pain today or at present, patient is concerned that he might have a UTI. Patient denies abdominal discomfort, discharge from penis, no low back pain or flank pain. Patient has agreed to come to the clinic tomorrow to be seen by Dr. Bubba Camp.

## 2017-11-29 ENCOUNTER — Encounter: Payer: Self-pay | Admitting: Internal Medicine

## 2017-11-29 ENCOUNTER — Non-Acute Institutional Stay: Payer: Medicare Other | Admitting: Internal Medicine

## 2017-11-29 VITALS — BP 122/64 | HR 74 | Temp 97.3°F | Resp 18 | Ht 73.0 in | Wt 151.2 lb

## 2017-11-29 DIAGNOSIS — N4889 Other specified disorders of penis: Secondary | ICD-10-CM

## 2017-11-29 NOTE — Progress Notes (Signed)
Location:  [facility] (12) Provider:  Blanchie Serve, MD  Blanchie Serve, MD  Patient Care Team: Blanchie Serve, MD as PCP - General (Internal Medicine) Danella Sensing, MD as Consulting Physician (Dermatology) Mast, Man X, NP as Nurse Practitioner (Internal Medicine)  Extended Emergency Contact Information Primary Emergency Contact: Kinker,Bill Address: [address]          [place], [place] [address] Johnnette Litter of [place] Phone: [phone] Mobile Phone: [phone] Relation: Brother Secondary Emergency Contact: [place] Mobile Phone: [phone] Relation: Relative   Goals of care: Advanced Directive information Advanced Directives 11/29/2017  Does Patient Have a Medical Advance Directive? Yes  Type of Paramedic of [place];Out of facility DNR (pink MOST or yellow form)  Does patient want to make changes to medical advance directive? No - Patient declined  Copy of [place] in Chart? Yes  Pre-existing out of facility DNR order (yellow form or pink MOST form) Yellow form placed in chart (order not valid for inpatient use)     Chief Complaint  Patient presents with  . Acute Visit    Penile pain. Patient mentioned that it started last Thursday. He mentioned that the pain came and went when it first happened. At present patient hasn't had any pain since.   . Medication Refill    No refills needed at this time    HPI:  Pt is a 84 y.o. Cody [name] seen today for an acute visit for pain in penis last week. It started last Wednesday/thursday as a sharp pain that was intermittent, lasted for a day or two and has now resolved. Denies any burning or pain with urination. Denies hematuria. Denies any fever or chills. He has history of enlarged prostate and has slow streaming of urination, denies any trouble with flow of urine. Denies any groin pain. Denies feeling a lump  or mass to penile or groin area. Provides history of right sided inguinal hernia s/p repair, no problem at this site. Denies penile discharge. Denies abdominal pain.    Past Medical History:  Diagnosis Date  . Anginal pain ([place])    nuclear stress 03/12/13 EPIC  . Balance problem 07/17/2015  . Basal cell carcinoma    left forearm  . BPH (benign prostatic hyperplasia)   . Cataract, nuclear 08/08/2014  . 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 Cervical spondylosis without myelopathy 08/22/2015  . Diverticulosis   . Diverticulosis of colon without hemorrhage 08/22/2015  . Edema 08/18/2015  . Fall 07/17/2015  . Gait disturbance 08/22/2015  . GERD (gastroesophageal reflux disease)    occurs rarely  . History of CVA (cerebrovascular accident) 07/17/2015  . HLD (hyperlipidemia) 07/17/2015  . Hyperlipidemia   . Hypertension   . Inguinal hernia   . Left carotid bruit   . Melanoma ([place])    left forearm  . Osteopenia   . Paroxysmal atrial fibrillation ([facility])   . Physical deconditioning 08/22/2015  . Retinal hemorrhage 04/24/2014  . Rosacea blepharoconjunctivitis 11/20/2011  . Stroke ([place]) 3/07   affected balance  . Tubular adenoma of colon 2007  . Weakness 07/17/2015   Past Surgical History:  Procedure Laterality Date  . COLONOSCOPY  2008  . EYE SURGERY Right    cataract extraction with IOL  . INGUINAL HERNIA REPAIR Right 03/15/2013   Procedure: HERNIA REPAIR INGUINAL ADULT;  Surgeon: Odis Hollingshead, MD;  Location: [facility];  Service: General;  Laterality: Right;  .  INSERTION OF MESH Right 03/15/2013   Procedure: INSERTION OF MESH;  Surgeon: Odis Hollingshead, MD;  Location: [facility];  Service: General;  Laterality: Right;  . left forearm melanoma    . PROSTATE BIOPSY    . TONSILLECTOMY  1933    Allergies  Allergen Reactions  . Aspirin   . Eggs Or Egg-Derived Products Swelling    throat    Outpatient Encounter Medications as of 11/29/2017  Medication Sig  . cholecalciferol (VITAMIN D) 400  units TABS tablet Take 400 Units by mouth 2 (two) times daily.  . clopidogrel (PLAVIX) 75 MG tablet Take 75 mg by mouth daily. Start PLAVIX from 07/24/15 if no further hematuria is reported by patient and HB > 12.  . digoxin (LANOXIN) 0.125 MG tablet Take 0.125 mg by mouth daily.  Marland Kitchen dutasteride (AVODART) 0.5 MG capsule Take 0.5 mg by mouth daily.   Marland Kitchen levothyroxine (SYNTHROID, LEVOTHROID) 50 MCG tablet Take 50 mcg by mouth daily before breakfast.  . NITROSTAT 0.4 MG SL tablet Place 0.4 mg under the tongue every 5 (five) minutes as needed for chest pain.   . polyethylene glycol (MIRALAX / GLYCOLAX) packet Take 17 g by mouth as needed.   . pravastatin (PRAVACHOL) 20 MG tablet Take 20 mg by mouth every evening.   . triamterene-hydrochlorothiazide (DYAZIDE) 37.5-25 MG capsule Take 1 capsule by mouth daily.   No facility-administered encounter medications on file as of 11/29/2017.     Review of Systems  Constitutional: Negative for appetite change, chills, fatigue and fever.  Respiratory: Negative for shortness of breath.   Cardiovascular: Negative for chest pain.       Uses NTG as needed for chest pain  Gastrointestinal: Negative for abdominal distention, abdominal pain, constipation, diarrhea, nausea and vomiting.  Genitourinary: Positive for frequency and urgency. Negative for discharge, dysuria, flank pain, genital sores, hematuria, penile pain, penile swelling, scrotal swelling and testicular pain.  Musculoskeletal: Positive for gait problem. Negative for back pain.       Uses wheelchair for ambulation  Skin: Negative for rash.  Neurological: Negative for dizziness and headaches.    Immunization History  Administered Date(s) Administered  . PPD Test 07/21/2015, 08/04/2015  . Tdap 07/16/2015   Pertinent  Health Maintenance Due  Topic Date Due  . PNA vac Low Risk Adult (1 of 2 - PCV13) 06/19/1992  . INFLUENZA VACCINE  Discontinued   Fall Risk  06/14/2017 02/26/2016 11/06/2015  Falls in  the past year? No Yes Yes  Comment - - 07/16/15 hit head went to hospital  Number falls in past yr: - 1 1  Comment - 07/16/15 -  Injury with Fall? - Yes Yes  Comment - laceration on head -   Functional Status Survey:    Vitals:   11/29/17 1156  BP: 122/64  Pulse: 74  Resp: 18  Temp: (!) 97.3 F (36.3 C)  TempSrc: Oral  SpO2: 97%  Weight: 151 lb 3.2 oz (68.6 kg)  Height: 6\' 1"  (1.854 m)   Body mass index is 19.95 kg/m. Physical Exam  Constitutional: He is oriented to person, place, and time.  Thin built, elderly Cody [name] in no acute distress  HENT:  Head: Normocephalic and atraumatic.  Eyes: Pupils are equal, round, and reactive to light.  Neck: Normal range of motion. Neck supple.  Cardiovascular: Normal rate and regular rhythm.  Pulmonary/Chest: Effort normal and breath sounds normal. He has no wheezes. He has no rales.  Abdominal: Soft. Bowel sounds are normal.  He exhibits no mass. There is no tenderness. There is no guarding.  Small right sided inguinal hernia, non tender, reducible  Musculoskeletal: He exhibits no edema.  Unsteady gait, uses wheelchair for ambulation  Lymphadenopathy:    He has no cervical adenopathy.  Neurological: He is alert and oriented to person, place, and time.  Skin: Skin is warm and dry. He is not diaphoretic.  Psychiatric: He has a normal mood and affect.    Labs reviewed: Recent Labs    03/24/17 0750 06/01/17 0000  NA 139 141  K 4.3 4.4  CL 100 102  CO2 34* 29  GLUCOSE 94 82  BUN 31* 32*  CREATININE 1.08 1.19*  CALCIUM 9.6 9.3   Recent Labs    03/24/17 0750 06/01/17 0000  AST 14 17  ALT 12 11  BILITOT 0.7 0.7  PROT 6.6 6.3   Recent Labs    03/24/17 0000 06/01/17 0000  WBC 5.6 5.6  NEUTROABS 2,559 2,699  HGB 15.0 14.5  HCT 43.6 43.1  MCV 88.4 89.8  PLT 215 199   Lab Results  Component Value Date   TSH 3.90 06/01/2017   No results found for: HGBA1C Lab Results  Component Value Date   CHOL 125 06/01/2017    HDL 48 06/01/2017   LDLCALC 63 06/01/2017   TRIG 64 06/01/2017   CHOLHDL 2.6 06/01/2017    Significant Diagnostic Results in last 30 days:  No results found.  Assessment/Plan  1. Penile pain Resolved. Pt concerned of potential UTI. Reassured that his symptom and exam finding are not concerning for a urinary tract infection at present. Encouraged hydration for now. Advised to avoid heavy weight with his right sided hernia. Unclear of etiology of pain last week. monitor    Family/ staff Communication: reviewed care plan with patient    Labs/tests ordered:  none  Blanchie Serve, MD Internal Medicine [place],  [id number] Cell Phone (Monday-Friday 8 am - 5 pm): [phone] On Call: [phone] and follow prompts after 5 pm and on weekends Office Phone: [phone] Office Fax: [phone]

## 2017-11-29 NOTE — Patient Instructions (Signed)
  You are pain free this visit. Your exam is normal. If you develop any pain with urination, burning, blood in your urine let us know right away. I would encourage you to keep yourself hydrated for now.

## 2017-12-12 ENCOUNTER — Other Ambulatory Visit: Payer: Self-pay

## 2017-12-12 DIAGNOSIS — N183 Chronic kidney disease, stage 3 unspecified: Secondary | ICD-10-CM

## 2017-12-12 DIAGNOSIS — Z8673 Personal history of transient ischemic attack (TIA), and cerebral infarction without residual deficits: Secondary | ICD-10-CM

## 2017-12-12 DIAGNOSIS — E785 Hyperlipidemia, unspecified: Secondary | ICD-10-CM

## 2017-12-12 DIAGNOSIS — I739 Peripheral vascular disease, unspecified: Secondary | ICD-10-CM

## 2017-12-12 DIAGNOSIS — E039 Hypothyroidism, unspecified: Secondary | ICD-10-CM

## 2017-12-20 ENCOUNTER — Encounter: Payer: Self-pay | Admitting: Internal Medicine

## 2017-12-20 ENCOUNTER — Non-Acute Institutional Stay: Payer: Medicare Other | Admitting: Internal Medicine

## 2017-12-20 VITALS — BP 128/72 | HR 81 | Temp 97.4°F | Resp 18 | Ht 73.0 in | Wt 142.2 lb

## 2017-12-20 DIAGNOSIS — I480 Paroxysmal atrial fibrillation: Secondary | ICD-10-CM

## 2017-12-20 DIAGNOSIS — E039 Hypothyroidism, unspecified: Secondary | ICD-10-CM

## 2017-12-20 DIAGNOSIS — N401 Enlarged prostate with lower urinary tract symptoms: Secondary | ICD-10-CM | POA: Diagnosis not present

## 2017-12-20 DIAGNOSIS — N183 Chronic kidney disease, stage 3 unspecified: Secondary | ICD-10-CM

## 2017-12-20 DIAGNOSIS — R2681 Unsteadiness on feet: Secondary | ICD-10-CM

## 2017-12-20 DIAGNOSIS — K219 Gastro-esophageal reflux disease without esophagitis: Secondary | ICD-10-CM | POA: Diagnosis not present

## 2017-12-20 DIAGNOSIS — R350 Frequency of micturition: Secondary | ICD-10-CM

## 2017-12-20 DIAGNOSIS — I10 Essential (primary) hypertension: Secondary | ICD-10-CM | POA: Diagnosis not present

## 2017-12-20 DIAGNOSIS — Z8673 Personal history of transient ischemic attack (TIA), and cerebral infarction without residual deficits: Secondary | ICD-10-CM

## 2017-12-20 DIAGNOSIS — H6122 Impacted cerumen, left ear: Secondary | ICD-10-CM | POA: Diagnosis not present

## 2017-12-20 DIAGNOSIS — E785 Hyperlipidemia, unspecified: Secondary | ICD-10-CM

## 2017-12-20 MED ORDER — CARBAMIDE PEROXIDE 6.5 % OT SOLN: 3.0000 [drp] | Freq: Two times a day (BID) | OTIC | 0 refills | Status: AC

## 2017-12-20 NOTE — Patient Instructions (Addendum)
Make sure to see your eye doctor  You need your left ear cleaned. Apply wax softening solution to left ear twice a day for 1 week and then come to clinic to get your left ear cleaned.    Cholesterol Cholesterol is a white, waxy, fat-like substance that is needed by the human body in small amounts. The liver makes all the cholesterol we need. Cholesterol is carried from the liver by the blood through the blood vessels. Deposits of cholesterol (plaques) may build up on blood vessel (artery) walls. Plaques make the arteries narrower and stiffer. Cholesterol plaques increase the risk for heart attack and stroke. You cannot feel your cholesterol level even if it is very high. The only way to know that it is high is to have a blood test. Once you know your cholesterol levels, you should keep a record of the test results. Work with your health care provider to keep your levels in the desired range. What do the results mean?  Total cholesterol is a rough measure of all the cholesterol in your blood.  LDL (low-density lipoprotein) is the "bad" cholesterol. This is the type that causes plaque to build up on the artery walls. You want this level to be low.  HDL (high-density lipoprotein) is the "good" cholesterol because it cleans the arteries and carries the LDL away. You want this level to be high.  Triglycerides are fat that the body can either burn for energy or store. High levels are closely linked to heart disease. What are the desired levels of cholesterol?  Total cholesterol below 200.  LDL below 100 for people who are at risk, below 70 for people at very high risk.  HDL above 40 is good. A level of 60 or higher is considered to be protective against heart disease.  Triglycerides below 150. How can I lower my cholesterol? Diet Follow your diet program as told by your health care provider.  Choose fish or white meat chicken and [place], roasted or baked. Limit fatty cuts of red meat, fried  foods, and processed meats, such as sausage and lunch meats.  Eat lots of fresh fruits and vegetables.  Choose whole grains, beans, pasta, potatoes, and cereals.  Choose olive oil, corn oil, or canola oil, and use only small amounts.  Avoid butter, mayonnaise, shortening, or palm kernel oils.  Avoid foods with trans fats.  Drink skim or nonfat milk and eat low-fat or nonfat yogurt and cheeses. Avoid whole milk, cream, ice cream, egg yolks, and full-fat cheeses.  Healthier desserts include angel food cake, ginger snaps, animal crackers, hard candy, popsicles, and low-fat or nonfat frozen yogurt. Avoid pastries, cakes, pies, and cookies.  Exercise  Follow your exercise program as told by your health care provider. A regular program: ? Helps to decrease LDL and raise HDL. ? Helps with weight control.  Do things that increase your activity level, such as gardening, walking, and taking the stairs.  Ask your health care provider about ways that you can be more active in your daily life.  Medicine  Take over-the-counter and prescription medicines only as told by your health care provider. ? Medicine may be prescribed by your health care provider to help lower cholesterol and decrease the risk for heart disease. This is usually done if diet and exercise have failed to bring down cholesterol levels. ? If you have several risk factors, you may need medicine even if your levels are normal.  This information is not intended to replace  advice given to you by your health care provider. Make sure you discuss any questions you have with your health care provider. Document Released: 03/09/2001 Document Revised: 01/10/2016 Document Reviewed: 12/13/2015 Elsevier Interactive Patient Education  [employer].

## 2017-12-20 NOTE — Progress Notes (Signed)
[facility]  Provider: Blanchie Serve MD   Location:  [place] [facility] Service:  Clinic (12)  PCP: Blanchie Serve, MD Patient Care Team: Blanchie Serve, MD as PCP - General (Internal Medicine) Danella Sensing, MD as Consulting Physician (Dermatology) Mast, Man X, NP as Nurse Practitioner (Internal Medicine)  Extended Emergency Contact Information Primary Emergency Contact: Pitter,Bill Address: [address]          [place], [place] [address] Johnnette Litter of [place] Phone: [phone] Mobile Phone: [phone] Relation: Brother Secondary Emergency Contact: [place] Mobile Phone: [phone] Relation: Relative  Code Status: DNR  Goals of Care: Advanced Directive information Advanced Directives 12/20/2017  Does Patient Have a Medical Advance Directive? Yes  Type of Paramedic of [place];Out of facility DNR (pink MOST or yellow form)  Does patient want to make changes to medical advance directive? No - Patient declined  Copy of [place] in Chart? Yes  Pre-existing out of facility DNR order (yellow form or pink MOST form) Yellow form placed in chart (order not valid for inpatient use)      Chief Complaint  Patient presents with  . Annual Exam    No acute concerns at this time. Patient is open to discussing a MOST form  . Medication Refill    No refills needed at this time  . Results    Disuss labs    HPI: Patient is a 84 y.o. male seen today for annual exam. He denies any acute concern this visit. He is taking his medication.  afib- controlled HR.   History of CVA- takes plavix 75 mg daily, digoxin and pravastatin  Hypothyroidism- on levothyroxine 50 mcg daily, controlled symptom  Hyperlipidemia- takes pravastatin, denies myalgia  Hypertension- taking dyazide 37.5-25 mg daily, reviewed renal function  Past Medical History:  Diagnosis Date  .  Anginal pain ([place])    nuclear stress 03/12/13 EPIC  . Balance problem 07/17/2015  . Basal cell carcinoma    left forearm  . BPH (benign prostatic hyperplasia)   . Cataract, nuclear 08/08/2014  . 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 Cervical spondylosis without myelopathy 08/22/2015  . Diverticulosis   . Diverticulosis of colon without hemorrhage 08/22/2015  . Edema 08/18/2015  . Fall 07/17/2015  . Gait disturbance 08/22/2015  . GERD (gastroesophageal reflux disease)    occurs rarely  . History of CVA (cerebrovascular accident) 07/17/2015  . HLD (hyperlipidemia) 07/17/2015  . Hyperlipidemia   . Hypertension   . Inguinal hernia   . Left carotid bruit   . Melanoma ([place])    left forearm  . Osteopenia   . Paroxysmal atrial fibrillation (HCC)   . Physical deconditioning 08/22/2015  . Retinal hemorrhage 04/24/2014  . Rosacea blepharoconjunctivitis 11/20/2011  . Stroke ([place]) 3/07   affected balance  . Tubular adenoma of colon 2007  . Weakness 07/17/2015   Past Surgical History:  Procedure Laterality Date  . COLONOSCOPY  2008  . EYE SURGERY Right    cataract extraction with IOL  . INGUINAL HERNIA REPAIR Right 03/15/2013   Procedure: HERNIA REPAIR INGUINAL ADULT;  Surgeon: Odis Hollingshead, MD;  Location: [facility];  Service: General;  Laterality: Right;  . INSERTION OF MESH Right 03/15/2013   Procedure: INSERTION OF MESH;  Surgeon: Odis Hollingshead, MD;  Location: [facility];  Service: General;  Laterality: Right;  . left forearm melanoma    . PROSTATE BIOPSY    .  TONSILLECTOMY  1933    reports that he quit smoking about 45 years ago. He has never used smokeless tobacco. He reports that he does not drink alcohol or use drugs. Social History   Socioeconomic History  . Marital status: Divorced    Spouse name: Not on file  . Number of children: Not on file  . Years of education: Not on file  . Highest education level: Not on file  Occupational History  . Occupation: Retired Press photographer     Comment: Sales  Social Needs  . Financial resource strain: Not on file  . Food insecurity:    Worry: Not on file    Inability: Not on file  . Transportation needs:    Medical: Not on file    Non-medical: Not on file  Tobacco Use  . Smoking status: Former Smoker    Last attempt to quit: 11/05/1972    Years since quitting: 45.1  . Smokeless tobacco: Never Used  Substance and Sexual Activity  . Alcohol use: No    Comment:  3 ounces wine nightly  . Drug use: No  . Sexual activity: Not on file  Lifestyle  . Physical activity:    Days per week: Not on file    Minutes per session: Not on file  . Stress: Not on file  Relationships  . Social connections:    Talks on phone: Not on file    Gets together: Not on file    Attends religious service: Not on file    Active member of club or organization: Not on file    Attends meetings of clubs or organizations: Not on file    Relationship status: Not on file  . Intimate partner violence:    Fear of current or ex partner: Not on file    Emotionally abused: Not on file    Physically abused: Not on file    Forced sexual activity: Not on file  Other Topics Concern  . Not on file  Social History Narrative   Diet? Regular/normal      Do you drink/eat things with caffeine? no      Marital status?      divorced                              What year were you married? 1957      Do you live in a house, apartment, assisted living, condo, trailer, etc.? Apartment. Moved to [place] 08/21/2015      Is it one or more stories? 2      How many persons live in your home? Live alone      Do you have any pets in your home? (please list) no      Current or past profession: Sales      Do you exercise?      "I do now."                            Type & how often? Exercise bike, 3 times weekly      Do you have a living will? no      Do you have a DNR form?    yes                              If not, do you want to discuss one?  Do you have signed  POA/HPOA for forms?                 Functional Status Survey:    Family History  Problem Relation Age of Onset  . Cancer Mother        lung and breast  . Heart disease Father        MI  . Cancer Sister        pancreatic    Health Maintenance  Topic Date Due  . PNA vac Low Risk Adult (1 of 2 - PCV13) 06/19/1992  . TETANUS/TDAP  07/15/2025  . INFLUENZA VACCINE  Discontinued    Allergies  Allergen Reactions  . Aspirin   . Eggs Or Egg-Derived Products Swelling    throat    Outpatient Encounter Medications as of 12/20/2017  Medication Sig  . cholecalciferol (VITAMIN D) 400 units TABS tablet Take 400 Units by mouth 2 (two) times daily.  . clopidogrel (PLAVIX) 75 MG tablet Take 75 mg by mouth daily. Start PLAVIX from 07/24/15 if no further hematuria is reported by patient and HB > 12.  . digoxin (LANOXIN) 0.125 MG tablet Take 0.125 mg by mouth daily.  Marland Kitchen dutasteride (AVODART) 0.5 MG capsule Take 0.5 mg by mouth daily.   Marland Kitchen levothyroxine (SYNTHROID, LEVOTHROID) 50 MCG tablet Take 50 mcg by mouth daily before breakfast.  . NITROSTAT 0.4 MG SL tablet Place 0.4 mg under the tongue every 5 (five) minutes as needed for chest pain.   . polyethylene glycol (MIRALAX / GLYCOLAX) packet Take 17 g by mouth as needed.   . pravastatin (PRAVACHOL) 20 MG tablet Take 20 mg by mouth every evening.   . triamterene-hydrochlorothiazide (DYAZIDE) 37.5-25 MG capsule Take 1 capsule by mouth daily.   No facility-administered encounter medications on file as of 12/20/2017.     Review of Systems  Constitutional: Negative for appetite change, chills, fatigue and fever.  HENT: Negative for congestion, ear discharge, ear pain, hearing loss, mouth sores, postnasal drip, rhinorrhea, sinus pressure, sinus pain, sore throat and trouble swallowing.   Eyes: Positive for visual disturbance. Negative for pain and itching.       Has corrective glasses. Last seen by eye doctor 2 years back.   Respiratory: Positive  for cough. Negative for choking, shortness of breath and wheezing.        Occasional cough with meals with both solid and liquid food  Cardiovascular: Negative for chest pain, palpitations and leg swelling.       Had chest (ain about a month back and had to take NTG with relief.  Gastrointestinal: Positive for anal bleeding. Negative for abdominal pain, blood in stool, constipation, diarrhea, nausea and vomiting.  Genitourinary: Positive for frequency. Negative for dysuria, flank pain and hematuria.       No urinary incontinence  Musculoskeletal: Positive for gait problem.       Uses wheelchair for ambulation, no fall reported  Neurological: Positive for weakness. Negative for dizziness, light-headedness, numbness and headaches.       Has trouble expressing his thoughts at times, repeating himself  Hematological: Bruises/bleeds easily.  Psychiatric/Behavioral: Positive for decreased concentration. Negative for behavioral problems and sleep disturbance.    Vitals:   12/20/17 1002  BP: 128/72  Pulse: 81  Resp: 18  Temp: (!) 97.4 F (36.3 C)  TempSrc: Oral  SpO2: 96%  Weight: 145 lb 9.6 oz (66 kg)  Height: 6\' 1"  (1.854 m)   Body mass index is 19.21 kg/m. Physical  Exam  Constitutional: He is oriented to person, place, and time. No distress.  Thin built, frail, elderly male  HENT:  Head: Normocephalic and atraumatic.  Right Ear: External ear normal.  Left Ear: External ear normal.  Nose: Nose normal.  Mouth/Throat: Oropharynx is clear and moist. No oropharyngeal exudate.  Left ear cerumen impacted  Eyes: Pupils are equal, round, and reactive to light. Conjunctivae and EOM are normal. Right eye exhibits no discharge. Left eye exhibits no discharge.  Corrective glasses  Neck: Normal range of motion. Neck supple.  Cardiovascular: Intact distal pulses.  Irregular heart rate  Pulmonary/Chest: Effort normal and breath sounds normal. No respiratory distress. He has no wheezes. He has  no rales.  Abdominal: Soft. Bowel sounds are normal. There is no tenderness.  Musculoskeletal: He exhibits no edema.  Able to move all 4 extremities, weakness to LE, unsteady gait, uses wheelchair for ambulation  Lymphadenopathy:    He has no cervical adenopathy.  Neurological: He is alert and oriented to person, place, and time.  9/18 MMSE 30/30  Skin: Skin is warm and dry. He is not diaphoretic.  Psychiatric: He has a normal mood and affect. His behavior is normal.    Labs reviewed: Basic Metabolic Panel: Recent Labs    03/24/17 0750 06/01/17 0000 12/13/17 0705  NA 139 141 139  K 4.3 4.4 4.2  CL 100 102 104  CO2 34* 29 30  GLUCOSE 94 82 95  BUN 31* 32* 34*  CREATININE 1.08 1.19* 1.13*  CALCIUM 9.6 9.3 9.3   Liver Function Tests: Recent Labs    03/24/17 0750 06/01/17 0000 12/13/17 0705  AST 14 17 13   ALT 12 11 10   BILITOT 0.7 0.7 0.7  PROT 6.6 6.3 6.1   No results for input(s): LIPASE, AMYLASE in the last 8760 hours. No results for input(s): AMMONIA in the last 8760 hours. CBC: Recent Labs    03/24/17 0000 06/01/17 0000 12/13/17 0705  WBC 5.6 5.6 5.3  NEUTROABS 2,559 2,699  --   HGB 15.0 14.5 14.3  HCT 43.6 43.1 42.5  MCV 88.4 89.8 88.9  PLT 215 199 238   Cardiac Enzymes: No results for input(s): CKTOTAL, CKMB, CKMBINDEX, TROPONINI in the last 8760 hours. BNP: Invalid input(s): POCBNP No results found for: HGBA1C Lab Results  Component Value Date   TSH 4.87 (H) 12/13/2017   No results found for: VITAMINB12 No results found for: FOLATE No results found for: IRON, TIBC, FERRITIN  Lipid Panel: Recent Labs    03/24/17 0750 06/01/17 0000 12/13/17 0705  CHOL 125 125 130  HDL 44 48 40*  LDLCALC 65 63 75  TRIG 84 64 69  CHOLHDL 2.8 2.6 3.3   No results found for: HGBA1C  Procedures since last visit: No results found.   12/20/17 EKG- afib  Assessment/Plan  1. Essential hypertension Controlled BP, reviewed renal function, continue  dyazide  2. Gastroesophageal reflux disease without esophagitis continue  3. Acquired hypothyroidism Continue levothyroxine 50 mcg daily. Slightly elevated TSH, no new symptom. Monitor clinically for now - TSH; Future - T4, Free; Future  4. History of CVA (cerebrovascular accident) Continue plavix, statin and antihypertensive  5. Hyperlipidemia LDL goal <70 Stable LDL. Continue pravastatin  6. Paroxysmal atrial fibrillation (HCC) Controlled HR, denies palpitation, reviewed EKG showing afib. Has not wanted anticoagulation for quality of life with his fall risk and frequent fingersticks. Talked about other anticoagulation, pt defers this for now. He understands risk of stroke. Continue plavix. Continue digoxin.  CHADSVasc score of 4.   7. Benign prostatic hyperplasia with urinary frequency Continue dutasteride and monitor  8. Unsteady gait Wheelchair and walker for ambulation, fall precautions  9. Impacted cerumen, left ear Ear lavagae after 7 days f debrox to left ear - carbamide peroxide (DEBROX) 6.5 % OTIC solution; Place 3 drops into the left ear 2 (two) times daily for 7 days.  Dispense: 2.1 mL; Refill: 0  10. ckd stage 3 Monitor renal function, avoid NSAIDs.  57. Annual exam the patient was counseled regarding prevention of dental and periodontal disease, diet, regular sustained exercise for at least 30 minutes 5 times per week, the proper use of sunscreen and protective clothing, routine cholesterol, thyroid and diabetes screening. Patient counselled on need for routine eye exam. Reviewed goals of care- living will, HCPOA and DNR in chart. Copy of MOST form provided for review.    Labs/tests ordered:   Lab Orders     TSH     T4, Free  Next appointment: 4 months  Communication: reviewed care plan with patient     Blanchie Serve, MD Internal Medicine [place], [place] [address] Cell Phone (Monday-Friday 8  am - 5 pm): [phone] On Call: [phone] and follow prompts after 5 pm and on weekends Office Phone: [phone] Office Fax: [phone]

## 2017-12-27 ENCOUNTER — Encounter: Payer: Medicare Other | Admitting: Internal Medicine

## 2018-02-08 ENCOUNTER — Encounter: Payer: Self-pay | Admitting: Internal Medicine

## 2018-02-08 DIAGNOSIS — Z961 Presence of intraocular lens: Secondary | ICD-10-CM | POA: Diagnosis not present

## 2018-02-08 DIAGNOSIS — H25812 Combined forms of age-related cataract, left eye: Secondary | ICD-10-CM | POA: Diagnosis not present

## 2018-02-08 DIAGNOSIS — H35371 Puckering of macula, right eye: Secondary | ICD-10-CM | POA: Diagnosis not present

## 2018-03-14 ENCOUNTER — Encounter: Payer: Self-pay | Admitting: Nurse Practitioner

## 2018-03-20 DIAGNOSIS — E039 Hypothyroidism, unspecified: Secondary | ICD-10-CM | POA: Diagnosis not present

## 2018-03-21 DIAGNOSIS — E039 Hypothyroidism, unspecified: Secondary | ICD-10-CM

## 2018-03-21 LAB — TSH: TSH: 4.72 mIU/L — ABNORMAL HIGH (ref 0.40–4.50)

## 2018-03-21 LAB — T4, FREE: FREE T4: 1.2 ng/dL (ref 0.8–1.8)

## 2018-03-22 ENCOUNTER — Other Ambulatory Visit: Payer: Self-pay | Admitting: *Deleted

## 2018-03-22 DIAGNOSIS — E039 Hypothyroidism, unspecified: Secondary | ICD-10-CM

## 2018-03-22 NOTE — Progress Notes (Signed)
Labs ordered.

## 2018-03-28 LAB — COMPLETE METABOLIC PANEL WITH GFR
AG Ratio: 1.4 (calc) (ref 1.0–2.5)
ALBUMIN MSPROF: 3.6 g/dL (ref 3.6–5.1)
ALT: 10 U/L (ref 9–46)
AST: 13 U/L (ref 10–35)
Alkaline phosphatase (APISO): 67 U/L (ref 40–115)
BUN / CREAT RATIO: 30 (calc) — AB (ref 6–22)
BUN: 34 mg/dL — AB (ref 7–25)
CALCIUM: 9.3 mg/dL (ref 8.6–10.3)
CO2: 30 mmol/L (ref 20–32)
Chloride: 104 mmol/L (ref 98–110)
Creat: 1.13 mg/dL — ABNORMAL HIGH (ref 0.70–1.11)
GFR, EST NON AFRICAN AMERICAN: 57 mL/min/{1.73_m2} — AB (ref >=60)
GFR, Est African American: 66 mL/min/{1.73_m2} (ref >=60)
GLOBULIN: 2.5 g/dL (ref 1.9–3.7)
GLUCOSE: 95 mg/dL (ref 65–99)
Potassium: 4.2 mmol/L (ref 3.5–5.3)
SODIUM: 139 mmol/L (ref 135–146)
Total Bilirubin: 0.7 mg/dL (ref 0.2–1.2)
Total Protein: 6.1 g/dL (ref 6.1–8.1)

## 2018-03-28 LAB — TSH: TSH: 4.87 m[IU]/L — AB (ref 0.40–4.50)

## 2018-03-28 LAB — CBC
HCT: 42.5 % (ref 38.5–50.0)
Hemoglobin: 14.3 g/dL (ref 13.2–17.1)
MCH: 29.9 pg (ref 27.0–33.0)
MCHC: 33.6 g/dL (ref 32.0–36.0)
MCV: 88.9 fL (ref 80.0–100.0)
MPV: 10.5 fL (ref 7.5–12.5)
PLATELETS: 238 10*3/uL (ref 140–400)
RBC: 4.78 10*6/uL (ref 4.20–5.80)
RDW: 13.4 % (ref 11.0–15.0)
WBC: 5.3 10*3/uL (ref 3.8–10.8)

## 2018-03-28 LAB — LIPID PANEL
CHOLESTEROL: 130 mg/dL (ref <200)
HDL: 40 mg/dL — ABNORMAL LOW (ref >40)
LDL Cholesterol (Calc): 75 mg/dL (calc)
Non-HDL Cholesterol (Calc): 90 mg/dL (calc) (ref <130)
Total CHOL/HDL Ratio: 3.3 (calc) (ref <5.0)
Triglycerides: 69 mg/dL (ref <150)

## 2018-03-29 ENCOUNTER — Encounter: Payer: Self-pay | Admitting: Internal Medicine

## 2018-04-17 ENCOUNTER — Encounter: Payer: BLUE CROSS/BLUE SHIELD | Admitting: Nurse Practitioner

## 2018-04-18 ENCOUNTER — Other Ambulatory Visit: Payer: Medicare Other

## 2018-04-18 ENCOUNTER — Encounter: Payer: Self-pay | Admitting: Internal Medicine

## 2018-04-18 DIAGNOSIS — E039 Hypothyroidism, unspecified: Secondary | ICD-10-CM | POA: Diagnosis not present

## 2018-04-18 LAB — TSH: TSH: 3.82 m[IU]/L (ref 0.40–4.50)

## 2018-04-18 LAB — T4, FREE: Free T4: 1.2 ng/dL (ref 0.8–1.8)

## 2018-04-20 ENCOUNTER — Encounter: Payer: Self-pay | Admitting: Nurse Practitioner

## 2018-04-20 ENCOUNTER — Non-Acute Institutional Stay: Payer: Medicare Other | Admitting: Nurse Practitioner

## 2018-04-20 DIAGNOSIS — I480 Paroxysmal atrial fibrillation: Secondary | ICD-10-CM | POA: Diagnosis not present

## 2018-04-20 DIAGNOSIS — R350 Frequency of micturition: Secondary | ICD-10-CM | POA: Diagnosis not present

## 2018-04-20 DIAGNOSIS — N401 Enlarged prostate with lower urinary tract symptoms: Secondary | ICD-10-CM | POA: Diagnosis not present

## 2018-04-20 DIAGNOSIS — E039 Hypothyroidism, unspecified: Secondary | ICD-10-CM | POA: Diagnosis not present

## 2018-04-20 DIAGNOSIS — N183 Chronic kidney disease, stage 3 unspecified: Secondary | ICD-10-CM

## 2018-04-20 DIAGNOSIS — I10 Essential (primary) hypertension: Secondary | ICD-10-CM

## 2018-04-20 DIAGNOSIS — E785 Hyperlipidemia, unspecified: Secondary | ICD-10-CM

## 2018-04-20 NOTE — Assessment & Plan Note (Signed)
Blood pressure is controlled,  Continue Dyazide 37.5/25mg  qd. Update CMP

## 2018-04-20 NOTE — Assessment & Plan Note (Signed)
BHP, nocturnal urination 2-3x/night, continue Avodart 0.5mg  qd.

## 2018-04-20 NOTE — Patient Instructions (Addendum)
Will obtain digoxin level, CMP, TSH  prior to the next appointment. F/u in clinic [place] in 4 months.

## 2018-04-20 NOTE — Progress Notes (Signed)
Location:   clinic [place]   Place of Service:  Clinic (12) Provider: Marlana Latus NP  Code Status: DNR Goals of Care: [place] Advanced Directives 12/20/2017  Does Patient Have a Medical Advance Directive? Yes  Type of Paramedic of [place];Out of facility DNR (pink MOST or yellow form)  Does patient want to make changes to medical advance directive? No - Patient declined  Copy of [place] in Chart? Yes  Pre-existing out of facility DNR order (yellow form or pink MOST form) Yellow form placed in chart (order not valid for inpatient use)     Chief Complaint  Patient presents with  . Medical Management of Chronic Issues    4 mo f/u with labs    HPI: Patient is a 84 y.o. male seen today for medical management of chronic diseases.     The patient has history of Afib, heart rate is in control while taking Digoxin 0.125mg  qd. Blood pressure is controlled on Dyazide 37.5/25mg  qd. BHP, nocturnal urination 2-3x/night, on Avodart 0.5mg  qd. Hx of hypothyroidism, on Levothyroxine 64mcg qd, last TSH 3.82 Free T4 1.2 04/18/18.   Past Medical History:  Diagnosis Date  . Anginal pain ([place])    nuclear stress 03/12/13 EPIC  . Balance problem 07/17/2015  . Basal cell carcinoma    left forearm  . BPH (benign prostatic hyperplasia)   . Cataract, nuclear 08/08/2014  . 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 Cervical spondylosis without myelopathy 08/22/2015  . Diverticulosis   . Diverticulosis of colon without hemorrhage 08/22/2015  . Edema 08/18/2015  . Fall 07/17/2015  . Gait disturbance 08/22/2015  . GERD (gastroesophageal reflux disease)    occurs rarely  . History of CVA (cerebrovascular accident) 07/17/2015  . HLD (hyperlipidemia) 07/17/2015  . Hyperlipidemia   . Hypertension   . Inguinal hernia   . Left carotid bruit   . Melanoma ([place])    left forearm  . Osteopenia   . Paroxysmal atrial fibrillation (HCC)   . Physical deconditioning 08/22/2015  .  Retinal hemorrhage 04/24/2014  . Rosacea blepharoconjunctivitis 11/20/2011  . Stroke ([place]) 3/07   affected balance  . Tubular adenoma of colon 2007  . Weakness 07/17/2015    Past Surgical History:  Procedure Laterality Date  . COLONOSCOPY  2008  . EYE SURGERY Right    cataract extraction with IOL  . INGUINAL HERNIA REPAIR Right 03/15/2013   Procedure: HERNIA REPAIR INGUINAL ADULT;  Surgeon: Odis Hollingshead, MD;  Location: [facility];  Service: General;  Laterality: Right;  . INSERTION OF MESH Right 03/15/2013   Procedure: INSERTION OF MESH;  Surgeon: Odis Hollingshead, MD;  Location: [facility];  Service: General;  Laterality: Right;  . left forearm melanoma    . PROSTATE BIOPSY    . TONSILLECTOMY  1933    Allergies  Allergen Reactions  . Aspirin   . Eggs Or Egg-Derived Products Swelling    throat    Allergies as of 04/20/2018      Reactions   Aspirin    Eggs Or Egg-derived Products Swelling   throat      Medication List        Accurate as of 04/20/18 11:59 PM. Always use your most recent med list.          cholecalciferol 400 units Tabs tablet Commonly known as:  VITAMIN D Take 400 Units by mouth 2 (two) times daily.   clopidogrel 75 MG tablet Commonly known as:  PLAVIX  Take 75 mg by mouth daily. Start PLAVIX from 07/24/15 if no further hematuria is reported by patient and HB > 12.   dutasteride 0.5 MG capsule Commonly known as:  AVODART Take 0.5 mg by mouth daily.   LANOXIN 0.125 MG tablet Generic drug:  digoxin Take 0.125 mg by mouth daily.   levothyroxine 50 MCG tablet Commonly known as:  SYNTHROID, LEVOTHROID Take 50 mcg by mouth daily before breakfast.   NITROSTAT 0.4 MG SL tablet Generic drug:  nitroGLYCERIN Place 0.4 mg under the tongue every 5 (five) minutes as needed for chest pain.   polyethylene glycol packet Commonly known as:  MIRALAX / GLYCOLAX Take 17 g by mouth as needed.   pravastatin 20 MG tablet Commonly known as:  PRAVACHOL Take 20  mg by mouth every evening.   triamterene-hydrochlorothiazide 37.5-25 MG capsule Commonly known as:  DYAZIDE Take 1 capsule by mouth daily.       Review of Systems:  Review of Systems  Constitutional: Negative for activity change, appetite change, chills, diaphoresis, fatigue and fever.  HENT: Positive for hearing loss. Negative for congestion and voice change.   Respiratory: Negative for cough, shortness of breath and wheezing.   Cardiovascular: Positive for leg swelling. Negative for chest pain and palpitations.  Gastrointestinal: Negative.  Negative for abdominal distention, abdominal pain, constipation, diarrhea, nausea and vomiting.  Genitourinary: Positive for frequency. Negative for difficulty urinating, dysuria and urgency.  Musculoskeletal: Positive for arthralgias and gait problem.  Neurological: Negative for dizziness, speech difficulty, weakness and headaches.  Psychiatric/Behavioral: Negative for agitation, behavioral problems and sleep disturbance. The patient is not nervous/anxious.     Health Maintenance  Topic Date Due  . TETANUS/TDAP  07/15/2025  . INFLUENZA VACCINE  Discontinued  . PNA vac Low Risk Adult  Discontinued    Physical Exam: Vitals:   04/20/18 1401  BP: 130/88  Pulse: 84  Resp: 20  Temp: 97.7 F (36.5 C)  TempSrc: Oral  SpO2: 97%  Weight: 141 lb 12.8 oz (64.3 kg)  Height: 6\' 1"  (1.854 m)   Body mass index is 18.71 kg/m. Physical Exam  Constitutional: He is oriented to person, place, and time. He appears well-developed and well-nourished.  HENT:  Head: Normocephalic and atraumatic.  Small amount of ear wax seen R+L ear  Eyes: Pupils are equal, round, and reactive to light. EOM are normal.  Neck: Normal range of motion. Neck supple. No JVD present. No thyromegaly present.  Pulmonary/Chest: Effort normal. He has no wheezes. He has no rales.  Abdominal: Soft. He exhibits no distension. There is no tenderness. There is no rebound and no  guarding.  Musculoskeletal: He exhibits edema.  Self transfer, w/c for mobility. Trace edema in ankles.   Neurological: He is alert and oriented to person, place, and time. No cranial nerve deficit. He exhibits normal muscle tone. Coordination normal.  Skin: Skin is warm and dry.  Psychiatric: He has a normal mood and affect. His behavior is normal. Judgment and thought content normal.    Labs reviewed: Basic Metabolic Panel: Recent Labs    06/01/17 0000 12/13/17 0705 03/20/18 0000 04/18/18 0710  NA 141 139  --   --   K 4.4 4.2  --   --   CL 102 104  --   --   CO2 29 30  --   --   GLUCOSE 82 95  --   --   BUN 32* 34*  --   --   CREATININE 1.19*  1.13*  --   --   CALCIUM 9.3 9.3  --   --   TSH 3.90 4.87* 4.72* 3.82   Liver Function Tests: Recent Labs    06/01/17 0000 12/13/17 0705  AST 17 13  ALT 11 10  BILITOT 0.7 0.7  PROT 6.3 6.1   No results for input(s): LIPASE, AMYLASE in the last 8760 hours. No results for input(s): AMMONIA in the last 8760 hours. CBC: Recent Labs    06/01/17 0000 12/13/17 0705  WBC 5.6 5.3  NEUTROABS 2,699  --   HGB 14.5 14.3  HCT 43.1 42.5  MCV 89.8 88.9  PLT 199 238   Lipid Panel: Recent Labs    06/01/17 0000 12/13/17 0705  CHOL 125 130  HDL 48 40*  LDLCALC 63 75  TRIG 64 69  CHOLHDL 2.6 3.3   No results found for: HGBA1C  Procedures since last visit: No results found.  Assessment/Plan  Paroxysmal atrial fibrillation (HCC) Afib, heart rate is in control, continue  Digoxin 0.125mg  qd. Update digoxin level.   HTN (hypertension) Blood pressure is controlled,  Continue Dyazide 37.5/25mg  qd. Update CMP  BPH (benign prostatic hyperplasia) BHP, nocturnal urination 2-3x/night, continue Avodart 0.5mg  qd.   Acquired hypothyroidism Hx of hypothyroidism, continue  Levothyroxine 71mcg qd, last TSH 3.82 Free T4 1.2 04/18/18. Update TSH   CKD (chronic kidney disease) stage 3, GFR 30-59 ml/min (HCC) Baseline creat 1.2,  diuretics may be contributory.   Hyperlipidemia LDL goal <70 Continue Pravastatin, continue lipid panel.    Labs/tests ordered:  CMP, TSH, lipid panel,  Digoxin level  prior to the next appointment.   Next appt:  4 months  Plan of care: decline Prevnar 13  Time spend 25 minutes.

## 2018-04-20 NOTE — Assessment & Plan Note (Signed)
Baseline creat 1.2, diuretics may be contributory.

## 2018-04-20 NOTE — Assessment & Plan Note (Signed)
Continue Pravastatin, continue lipid panel.

## 2018-04-20 NOTE — Assessment & Plan Note (Signed)
Afib, heart rate is in control, continue  Digoxin 0.125mg  qd. Update digoxin level.

## 2018-04-20 NOTE — Assessment & Plan Note (Addendum)
Hx of hypothyroidism, continue  Levothyroxine 61mcg qd, last TSH 3.82 Free T4 1.2 04/18/18. Update TSH

## 2018-04-21 ENCOUNTER — Encounter: Payer: Self-pay | Admitting: Nurse Practitioner

## 2018-04-24 DIAGNOSIS — L570 Actinic keratosis: Secondary | ICD-10-CM | POA: Diagnosis not present

## 2018-04-24 DIAGNOSIS — Z85828 Personal history of other malignant neoplasm of skin: Secondary | ICD-10-CM | POA: Diagnosis not present

## 2018-04-24 DIAGNOSIS — L821 Other seborrheic keratosis: Secondary | ICD-10-CM | POA: Diagnosis not present

## 2018-04-24 DIAGNOSIS — Z85820 Personal history of malignant melanoma of skin: Secondary | ICD-10-CM | POA: Diagnosis not present

## 2018-05-16 ENCOUNTER — Other Ambulatory Visit: Payer: Self-pay

## 2018-06-15 ENCOUNTER — Other Ambulatory Visit: Payer: Medicare Other

## 2018-06-19 ENCOUNTER — Other Ambulatory Visit: Payer: Self-pay | Admitting: *Deleted

## 2018-06-19 DIAGNOSIS — I480 Paroxysmal atrial fibrillation: Secondary | ICD-10-CM | POA: Diagnosis not present

## 2018-06-19 DIAGNOSIS — I10 Essential (primary) hypertension: Secondary | ICD-10-CM

## 2018-06-19 DIAGNOSIS — E039 Hypothyroidism, unspecified: Secondary | ICD-10-CM | POA: Diagnosis not present

## 2018-06-20 ENCOUNTER — Other Ambulatory Visit: Payer: Medicare Other

## 2018-06-21 LAB — COMPLETE METABOLIC PANEL WITH GFR
AG Ratio: 1.4 (calc) (ref 1.0–2.5)
ALKALINE PHOSPHATASE (APISO): 60 U/L (ref 40–115)
ALT: 10 U/L (ref 9–46)
AST: 14 U/L (ref 10–35)
Albumin: 3.9 g/dL (ref 3.6–5.1)
BUN / CREAT RATIO: 25 (calc) — AB (ref 6–22)
BUN: 33 mg/dL — AB (ref 7–25)
CO2: 29 mmol/L (ref 20–32)
Calcium: 9.4 mg/dL (ref 8.6–10.3)
Chloride: 102 mmol/L (ref 98–110)
Creat: 1.3 mg/dL — ABNORMAL HIGH (ref 0.70–1.11)
GFR, Est African American: 55 mL/min/{1.73_m2} — ABNORMAL LOW (ref >=60)
GFR, Est Non African American: 48 mL/min/{1.73_m2} — ABNORMAL LOW (ref >=60)
Globulin: 2.7 g/dL (calc) (ref 1.9–3.7)
Glucose, Bld: 100 mg/dL — ABNORMAL HIGH (ref 65–99)
Potassium: 4 mmol/L (ref 3.5–5.3)
Sodium: 141 mmol/L (ref 135–146)
Total Bilirubin: 0.5 mg/dL (ref 0.2–1.2)
Total Protein: 6.6 g/dL (ref 6.1–8.1)

## 2018-06-21 LAB — CBC WITH DIFFERENTIAL/PLATELET
Absolute Monocytes: 437 cells/uL (ref 200–950)
BASOS ABS: 92 {cells}/uL (ref 0–200)
Basophils Relative: 1.7 %
EOS ABS: 459 {cells}/uL (ref 15–500)
Eosinophils Relative: 8.5 %
HEMATOCRIT: 44.7 % (ref 38.5–50.0)
Hemoglobin: 14.9 g/dL (ref 13.2–17.1)
LYMPHS ABS: 1852 {cells}/uL (ref 850–3900)
MCH: 30.5 pg (ref 27.0–33.0)
MCHC: 33.3 g/dL (ref 32.0–36.0)
MCV: 91.6 fL (ref 80.0–100.0)
MPV: 11.3 fL (ref 7.5–12.5)
Monocytes Relative: 8.1 %
NEUTROS PCT: 47.4 %
Neutro Abs: 2560 cells/uL (ref 1500–7800)
PLATELETS: 220 10*3/uL (ref 140–400)
RBC: 4.88 10*6/uL (ref 4.20–5.80)
RDW: 12.6 % (ref 11.0–15.0)
TOTAL LYMPHOCYTE: 34.3 %
WBC: 5.4 10*3/uL (ref 3.8–10.8)

## 2018-06-21 LAB — TSH: TSH: 4.3 mIU/L (ref 0.40–4.50)

## 2018-06-21 LAB — DIGOXIN LEVEL: Digoxin Level: 0.6 mcg/L — ABNORMAL LOW (ref 0.8–2.0)

## 2018-06-22 ENCOUNTER — Encounter: Payer: Self-pay | Admitting: Nurse Practitioner

## 2018-06-22 ENCOUNTER — Non-Acute Institutional Stay: Payer: Medicare Other | Admitting: Nurse Practitioner

## 2018-06-22 DIAGNOSIS — E039 Hypothyroidism, unspecified: Secondary | ICD-10-CM

## 2018-06-22 DIAGNOSIS — I480 Paroxysmal atrial fibrillation: Secondary | ICD-10-CM

## 2018-06-22 DIAGNOSIS — I10 Essential (primary) hypertension: Secondary | ICD-10-CM

## 2018-06-22 DIAGNOSIS — R609 Edema, unspecified: Secondary | ICD-10-CM | POA: Diagnosis not present

## 2018-06-22 DIAGNOSIS — N401 Enlarged prostate with lower urinary tract symptoms: Secondary | ICD-10-CM

## 2018-06-22 DIAGNOSIS — I209 Angina pectoris, unspecified: Secondary | ICD-10-CM | POA: Diagnosis not present

## 2018-06-22 DIAGNOSIS — R350 Frequency of micturition: Secondary | ICD-10-CM

## 2018-06-22 DIAGNOSIS — N183 Chronic kidney disease, stage 3 unspecified: Secondary | ICD-10-CM

## 2018-06-22 NOTE — Assessment & Plan Note (Signed)
Trace, stable.

## 2018-06-22 NOTE — Progress Notes (Signed)
Location:   clinic [facility]   Place of Service:   clinic [facility] Provider: Marlana Latus NP  Code Status: DNR Goals of Care: [place] 06/22/2018  Does Patient Have a Medical Advance Directive? Yes  Type of Paramedic of [place];Out of facility DNR (pink MOST or yellow form)  Does patient want to make changes to medical advance directive? No - Patient declined  Copy of [facility] in Chart? Yes - validated most recent copy scanned in chart (See row information)  Pre-existing out of facility DNR order (yellow form or pink MOST form) Yellow form placed in chart (order not valid for inpatient use)     Chief Complaint  Patient presents with  . Acute Visit    chest pain    HPI: Patient is a 84 y.o. male seen today for medical management of chronic diseases.    Hx of Angina, usually taking NTG x1 with relief, but he has question regarding taking up to 3x 5 minutes apart that he thinks he needs to take 0.4mg  sl x3 instead of up to 3 doses.   The patient has history of angina, recent atypical chest pain on and off, prn NTG used with efficacy. Hx of hypothyroidism, on Levothyroxine 76mcg qd. Afib, heart rate is in control, on Dig 0.125mg  qd. HTN, blood pressure is controlled on Dyazide 37.5/25mg  qd, BPH stable on  Avodart 0.5mg  qd. 06/20/18 Dig 0.6, Na 141, K 4.0, Bun 33, creat 1.3. TSH 4.3, wbc 5.4, Hgb 14.9, plte 220  Past Medical History:  Diagnosis Date  . Anginal pain ([place])    nuclear stress 03/12/13 EPIC  . Balance problem 07/17/2015  . Basal cell carcinoma    left forearm  . BPH (benign prostatic hyperplasia)   . Cataract, nuclear 08/08/2014  . 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 Cervical spondylosis without myelopathy 08/22/2015  . Diverticulosis   . Diverticulosis of colon without hemorrhage 08/22/2015  . Edema 08/18/2015  . Fall 07/17/2015  . Gait disturbance 08/22/2015  . GERD (gastroesophageal reflux disease)    occurs rarely  .  History of CVA (cerebrovascular accident) 07/17/2015  . HLD (hyperlipidemia) 07/17/2015  . Hyperlipidemia   . Hypertension   . Inguinal hernia   . Left carotid bruit   . Melanoma ([place])    left forearm  . Osteopenia   . Paroxysmal atrial fibrillation (HCC)   . Physical deconditioning 08/22/2015  . Retinal hemorrhage 04/24/2014  . Rosacea blepharoconjunctivitis 11/20/2011  . Stroke ([place]) 3/07   affected balance  . Tubular adenoma of colon 2007  . Weakness 07/17/2015    Past Surgical History:  Procedure Laterality Date  . COLONOSCOPY  2008  . EYE SURGERY Right    cataract extraction with IOL  . INGUINAL HERNIA REPAIR Right 03/15/2013   Procedure: HERNIA REPAIR INGUINAL ADULT;  Surgeon: Odis Hollingshead, MD;  Location: [facility];  Service: General;  Laterality: Right;  . INSERTION OF MESH Right 03/15/2013   Procedure: INSERTION OF MESH;  Surgeon: Odis Hollingshead, MD;  Location: [facility];  Service: General;  Laterality: Right;  . left forearm melanoma    . PROSTATE BIOPSY    . TONSILLECTOMY  1933    Allergies  Allergen Reactions  . Aspirin   . Eggs Or Egg-Derived Products Swelling    throat    Allergies as of 06/22/2018      Reactions   Aspirin    Eggs Or Egg-derived Products Swelling   throat  Medication List       Accurate as of June 22, 2018 11:59 PM. Always use your most recent med list.        cholecalciferol 10 MCG (400 UNIT) Tabs tablet Commonly known as:  VITAMIN D3 Take 400 Units by mouth 2 (two) times daily.   clopidogrel 75 MG tablet Commonly known as:  PLAVIX Take 75 mg by mouth daily. Start PLAVIX from 07/24/15 if no further hematuria is reported by patient and HB > 12.   dutasteride 0.5 MG capsule Commonly known as:  AVODART Take 0.5 mg by mouth daily.   LANOXIN 0.125 MG tablet Generic drug:  digoxin Take 0.125 mg by mouth daily.   levothyroxine 50 MCG tablet Commonly known as:  SYNTHROID, LEVOTHROID Take 50 mcg by mouth daily before  breakfast.   triamterene-hydrochlorothiazide 37.5-25 MG capsule Commonly known as:  DYAZIDE Take 1 capsule by mouth daily.       Review of Systems:  Review of Systems  Constitutional: Negative for activity change, appetite change, chills, diaphoresis, fatigue, fever and unexpected weight change.  HENT: Negative for congestion and voice change.   Respiratory: Negative for cough, chest tightness, shortness of breath and wheezing.   Cardiovascular: Positive for chest pain and leg swelling. Negative for palpitations.  Gastrointestinal: Negative for abdominal distention, abdominal pain, constipation, diarrhea and nausea.  Genitourinary: Positive for frequency. Negative for difficulty urinating and dysuria.  Musculoskeletal: Positive for arthralgias, back pain and gait problem.  Skin: Negative for color change and pallor.  Neurological: Negative for dizziness, speech difficulty, weakness and headaches.  Psychiatric/Behavioral: Negative for agitation, hallucinations and sleep disturbance. The patient is not nervous/anxious.     Health Maintenance  Topic Date Due  . TETANUS/TDAP  07/15/2025  . INFLUENZA VACCINE  Discontinued  . PNA vac Low Risk Adult  Discontinued    Physical Exam: Vitals:   06/22/18 1551  BP: 120/88  Pulse: 67  Resp: 20  Temp: 97.8 F (36.6 C)  SpO2: 97%  Weight: 148 lb 9.6 oz (67.4 kg)  Height: 6\' 1"  (1.854 m)   Body mass index is 19.61 kg/m. Physical Exam Constitutional:      General: He is not in acute distress.    Appearance: Normal appearance. He is not diaphoretic.  HENT:     Head: Normocephalic and atraumatic.     Nose: Nose normal.     Mouth/Throat:     Mouth: Mucous membranes are moist.  Eyes:     Extraocular Movements: Extraocular movements intact.     Pupils: Pupils are equal, round, and reactive to light.  Neck:     Musculoskeletal: Normal range of motion and neck supple.  Cardiovascular:     Rate and Rhythm: Normal rate and regular  rhythm.     Heart sounds: No murmur.  Pulmonary:     Effort: Pulmonary effort is normal.     Breath sounds: No wheezing, rhonchi or rales.  Abdominal:     General: There is no distension.     Palpations: Abdomen is soft.     Tenderness: There is no abdominal tenderness. There is no guarding or rebound.  Musculoskeletal:     Right lower leg: Edema present.     Left lower leg: Edema present.     Comments: Trace edema BLE. W/c for mobility.   Skin:    General: Skin is warm and dry.     Coloration: Skin is not pale.     Findings: No rash.  Neurological:  General: No focal deficit present.     Mental Status: He is alert and oriented to person, place, and time.     Motor: No weakness.     Coordination: Coordination normal.     Gait: Gait abnormal.  Psychiatric:        Mood and Affect: Mood normal.        Behavior: Behavior normal.        Thought Content: Thought content normal.        Judgment: Judgment normal.     Labs reviewed: Basic Metabolic Panel: Recent Labs    12/13/17 0705 03/20/18 0000 04/18/18 0710 06/19/18 0000  NA 139  --   --  141  K 4.2  --   --  4.0  CL 104  --   --  102  CO2 30  --   --  29  GLUCOSE 95  --   --  100*  BUN 34*  --   --  33*  CREATININE 1.13*  --   --  1.30*  CALCIUM 9.3  --   --  9.4  TSH 4.87* 4.72* 3.82 4.30   Liver Function Tests: Recent Labs    12/13/17 0705 06/19/18 0000  AST 13 14  ALT 10 10  BILITOT 0.7 0.5  PROT 6.1 6.6   No results for input(s): LIPASE, AMYLASE in the last 8760 hours. No results for input(s): AMMONIA in the last 8760 hours. CBC: Recent Labs    12/13/17 0705 06/19/18 0000  WBC 5.3 5.4  NEUTROABS  --  2,560  HGB 14.3 14.9  HCT 42.5 44.7  MCV 88.9 91.6  PLT 238 220   Lipid Panel: Recent Labs    12/13/17 0705  CHOL 130  HDL 40*  LDLCALC 75  TRIG 69  CHOLHDL 3.3   No results found for: HGBA1C  Procedures since last visit: No results found.  Assessment/Plan  Angina pectoris  (HCC) Continue prn NTG 0.4mg  sl up to 3 x with 5 minutes apart, may consider ED eval if symptom persists, update EKG showed Afib, no significant ST T changes.   Paroxysmal atrial fibrillation (HCC) Heart rate is in control, continue Dig  Acquired hypothyroidism TSH wnl 3.82 04/18/18, continue Levothyroxine 71mcg qd.   BPH (benign prostatic hyperplasia) Stable, continue Avodart 0.5mg  qd  Edema Trace, stable.   HTN (hypertension) Blood pressure is controlled, continue Dyazide 37.5/25mg  qd.   CKD (chronic kidney disease) stage 3, GFR 30-59 ml/min (HCC) 06/20/18 Dig 0.6, Na 141, K 4.0, Bun 33, creat 1.3. TSH 4.3, wbc 5.4, Hgb 14.9, plte 220. Encourage oral fluid intake. Monitor.    Labs/tests ordered:  EKG  Next appt:  4 months

## 2018-06-22 NOTE — Assessment & Plan Note (Signed)
Heart rate is in control, continue Dig

## 2018-06-22 NOTE — Patient Instructions (Addendum)
F/u in clinic [facility] in 6 months. Encourage oral fluid intake.

## 2018-06-22 NOTE — Assessment & Plan Note (Addendum)
Continue prn NTG 0.4mg  sl up to 3 x with 5 minutes apart, may consider ED eval if symptom persists, update EKG showed Afib, no significant ST T changes.

## 2018-06-22 NOTE — Assessment & Plan Note (Addendum)
06/20/18 Dig 0.6, Na 141, K 4.0, Bun 33, creat 1.3. TSH 4.3, wbc 5.4, Hgb 14.9, plte 220. Encourage oral fluid intake. Monitor.

## 2018-06-22 NOTE — Assessment & Plan Note (Signed)
Stable, continue Avodart 0.5mg  qd

## 2018-06-22 NOTE — Assessment & Plan Note (Signed)
TSH wnl 3.82 04/18/18, continue Levothyroxine 47mcg qd.

## 2018-06-22 NOTE — Assessment & Plan Note (Signed)
Blood pressure is controlled, continue Dyazide 37.5/25mg  qd.

## 2018-06-26 ENCOUNTER — Encounter: Payer: Self-pay | Admitting: Nurse Practitioner

## 2018-08-15 ENCOUNTER — Other Ambulatory Visit: Payer: Self-pay

## 2018-08-17 ENCOUNTER — Ambulatory Visit: Payer: Self-pay | Admitting: Nurse Practitioner

## 2018-08-21 DIAGNOSIS — H25812 Combined forms of age-related cataract, left eye: Secondary | ICD-10-CM | POA: Diagnosis not present

## 2018-08-21 DIAGNOSIS — H35371 Puckering of macula, right eye: Secondary | ICD-10-CM | POA: Diagnosis not present

## 2018-08-21 DIAGNOSIS — Z961 Presence of intraocular lens: Secondary | ICD-10-CM | POA: Diagnosis not present

## 2018-09-07 ENCOUNTER — Other Ambulatory Visit: Payer: Self-pay

## 2018-09-07 ENCOUNTER — Non-Acute Institutional Stay: Payer: Medicare Other | Admitting: Nurse Practitioner

## 2018-09-07 ENCOUNTER — Encounter: Payer: Self-pay | Admitting: Nurse Practitioner

## 2018-09-07 DIAGNOSIS — R350 Frequency of micturition: Secondary | ICD-10-CM | POA: Diagnosis not present

## 2018-09-07 DIAGNOSIS — I10 Essential (primary) hypertension: Secondary | ICD-10-CM

## 2018-09-07 DIAGNOSIS — I480 Paroxysmal atrial fibrillation: Secondary | ICD-10-CM | POA: Diagnosis not present

## 2018-09-07 DIAGNOSIS — I739 Peripheral vascular disease, unspecified: Secondary | ICD-10-CM

## 2018-09-07 DIAGNOSIS — E039 Hypothyroidism, unspecified: Secondary | ICD-10-CM | POA: Diagnosis not present

## 2018-09-07 DIAGNOSIS — N401 Enlarged prostate with lower urinary tract symptoms: Secondary | ICD-10-CM | POA: Diagnosis not present

## 2018-09-07 NOTE — Assessment & Plan Note (Signed)
Afib, heart rate is in control, continue Digoxin 0.125mg  qd.

## 2018-09-07 NOTE — Assessment & Plan Note (Signed)
Chronic redness BLE, brownish pigmented skin changes BLE. Right lateral malleolus scabbed over area persisted. Will apply Mepilex prn for protection.

## 2018-09-07 NOTE — Assessment & Plan Note (Signed)
no urinary retention, continue Avodart 0.5mg  qd.

## 2018-09-07 NOTE — Patient Instructions (Signed)
Protective dressing to the lateral right malleolus prn. F/u in clinic as scheduled.

## 2018-09-07 NOTE — Assessment & Plan Note (Signed)
continue Levothyroxine 64mcg qd, last TSH 4.3 06/19/18.

## 2018-09-07 NOTE — Assessment & Plan Note (Signed)
blood pressure is controlled, continue Dyazide 37.5/25mg  qd.

## 2018-09-07 NOTE — Progress Notes (Signed)
Location:   clinic [facility]   Place of Service:  Clinic (12) Provider: Marlana Latus NP  Code Status: DNR Goals of Care: [place] 06/22/2018  Does Patient Have a Medical Advance Directive? Yes  Type of Paramedic of [place];Out of facility DNR (pink MOST or yellow form)  Does patient want to make changes to medical advance directive? No - Patient declined  Copy of [place] in Chart? Yes - validated most recent copy scanned in chart (See row information)  Pre-existing out of facility DNR order (yellow form or pink MOST form) Yellow form placed in chart (order not valid for inpatient use)     Chief Complaint  Patient presents with  . Acute Visit    (R) sore  on ankle    HPI: Patient is a 85 y.o. male seen today for an acute visit for sore of the right ankle, at least about 3 years, scabbed over area lateral right malleolus, pain when pressed, no s/s of infection. Hx of PVD.    Hx of HTN, blood pressure is controlled on Dyazide 37.5/25mg  qd. Afib, heart rate is in control, on Digoxin 0.125mg  qd. Hypothyroidism, on Levothyroxine 1mcg qd, last TSH 4.3 06/19/18. BPH, no urinary retention, on Avodart 0.5mg  qd.   Past Medical History:  Diagnosis Date  . Anginal pain ([place])    nuclear stress 03/12/13 EPIC  . Balance problem 07/17/2015  . Basal cell carcinoma    left forearm  . BPH (benign prostatic hyperplasia)   . Cataract, nuclear 08/08/2014  . 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 Cervical spondylosis without myelopathy 08/22/2015  . Diverticulosis   . Diverticulosis of colon without hemorrhage 08/22/2015  . Edema 08/18/2015  . Fall 07/17/2015  . Gait disturbance 08/22/2015  . GERD (gastroesophageal reflux disease)    occurs rarely  . History of CVA (cerebrovascular accident) 07/17/2015  . HLD (hyperlipidemia) 07/17/2015  . Hyperlipidemia   . Hypertension   . Inguinal hernia   . Left carotid bruit   . Melanoma ([place])    left  forearm  . Osteopenia   . Paroxysmal atrial fibrillation (HCC)   . Physical deconditioning 08/22/2015  . Retinal hemorrhage 04/24/2014  . Rosacea blepharoconjunctivitis 11/20/2011  . Stroke ([place]) 3/07   affected balance  . Tubular adenoma of colon 2007  . Weakness 07/17/2015    Past Surgical History:  Procedure Laterality Date  . COLONOSCOPY  2008  . EYE SURGERY Right    cataract extraction with IOL  . INGUINAL HERNIA REPAIR Right 03/15/2013   Procedure: HERNIA REPAIR INGUINAL ADULT;  Surgeon: Odis Hollingshead, MD;  Location: [facility];  Service: General;  Laterality: Right;  . INSERTION OF MESH Right 03/15/2013   Procedure: INSERTION OF MESH;  Surgeon: Odis Hollingshead, MD;  Location: [facility];  Service: General;  Laterality: Right;  . left forearm melanoma    . PROSTATE BIOPSY    . TONSILLECTOMY  1933    Allergies  Allergen Reactions  . Aspirin   . Eggs Or Egg-Derived Products Swelling    throat    Allergies as of 09/07/2018      Reactions   Aspirin    Eggs Or Egg-derived Products Swelling   throat      Medication List       Accurate as of September 07, 2018 11:59 PM. Always use your most recent med list.        cholecalciferol 10 MCG (400 UNIT) Tabs tablet Commonly  known as:  VITAMIN D3 Take 400 Units by mouth 2 (two) times daily.   clopidogrel 75 MG tablet Commonly known as:  PLAVIX Take 75 mg by mouth daily. Start PLAVIX from 07/24/15 if no further hematuria is reported by patient and HB > 12.   dutasteride 0.5 MG capsule Commonly known as:  AVODART Take 0.5 mg by mouth daily.   Lanoxin 0.125 MG tablet Generic drug:  digoxin Take 0.125 mg by mouth daily.   levothyroxine 50 MCG tablet Commonly known as:  SYNTHROID, LEVOTHROID Take 50 mcg by mouth daily before breakfast.   pravastatin 20 MG tablet Commonly known as:  PRAVACHOL Take 1 tablet by mouth at bedtime.   triamterene-hydrochlorothiazide 37.5-25 MG capsule Commonly known as:  DYAZIDE Take 1 capsule  by mouth daily.       Review of Systems:  Review of Systems  Constitutional: Negative for activity change, appetite change, chills, diaphoresis and fever.  HENT: Positive for hearing loss. Negative for congestion and voice change.   Respiratory: Negative for cough, shortness of breath and wheezing.   Cardiovascular: Positive for leg swelling. Negative for chest pain and palpitations.  Gastrointestinal: Negative for abdominal distention, abdominal pain, constipation, diarrhea, nausea and vomiting.  Genitourinary: Positive for frequency. Negative for difficulty urinating, dysuria and urgency.  Musculoskeletal: Positive for arthralgias and gait problem.  Neurological: Negative for dizziness, speech difficulty and weakness.  Psychiatric/Behavioral: Negative for agitation, behavioral problems, hallucinations and sleep disturbance. The patient is not nervous/anxious.     Health Maintenance  Topic Date Due  . TETANUS/TDAP  07/15/2025  . INFLUENZA VACCINE  Discontinued  . PNA vac Low Risk Adult  Discontinued    Physical Exam: Vitals:   09/07/18 1304  BP: 120/80  Pulse: 78  Resp: 18  Temp: 97.6 F (36.4 C)  TempSrc: Oral  SpO2: 98%  Weight: 148 lb 3.2 oz (67.2 kg)  Height: 6\' 1"  (1.854 m)   Body mass index is 19.55 kg/m. Physical Exam Constitutional:      General: He is not in acute distress.    Appearance: Normal appearance. He is normal weight. He is not ill-appearing, toxic-appearing or diaphoretic.  HENT:     Head: Normocephalic and atraumatic.     Nose: Nose normal.     Mouth/Throat:     Mouth: Mucous membranes are moist.  Eyes:     Extraocular Movements: Extraocular movements intact.     Pupils: Pupils are equal, round, and reactive to light.  Neck:     Musculoskeletal: Normal range of motion and neck supple.  Cardiovascular:     Rate and Rhythm: Normal rate and regular rhythm.     Heart sounds: No murmur.  Pulmonary:     Effort: Pulmonary effort is normal.      Breath sounds: No wheezing, rhonchi or rales.  Abdominal:     General: There is no distension.     Palpations: Abdomen is soft.     Tenderness: There is no abdominal tenderness. There is no guarding or rebound.  Musculoskeletal:     Right lower leg: Edema present.     Left lower leg: Edema present.     Comments: Trace edema BLE  Skin:    General: Skin is warm and dry.     Capillary Refill: Capillary refill takes 2 to 3 seconds.     Comments: Brownish pigmented skin changes BLE. Redness BLE/feet. A scabbed over area lateral right malleolus showed no s/s of infection, pain when pressed.  Neurological:     General: No focal deficit present.     Mental Status: He is alert. Mental status is at baseline.     Motor: No weakness.     Coordination: Coordination normal.     Gait: Gait abnormal.     Comments: Oriented to person and place. wc for mobility.   Psychiatric:        Mood and Affect: Mood normal.        Behavior: Behavior normal.        Thought Content: Thought content normal.        Judgment: Judgment normal.     Labs reviewed: Basic Metabolic Panel: Recent Labs    12/13/17 0705 03/20/18 0000 04/18/18 0710 06/19/18 0000  NA 139  --   --  141  K 4.2  --   --  4.0  CL 104  --   --  102  CO2 30  --   --  29  GLUCOSE 95  --   --  100*  BUN 34*  --   --  33*  CREATININE 1.13*  --   --  1.30*  CALCIUM 9.3  --   --  9.4  TSH 4.87* 4.72* 3.82 4.30   Liver Function Tests: Recent Labs    12/13/17 0705 06/19/18 0000  AST 13 14  ALT 10 10  BILITOT 0.7 0.5  PROT 6.1 6.6   No results for input(s): LIPASE, AMYLASE in the last 8760 hours. No results for input(s): AMMONIA in the last 8760 hours. CBC: Recent Labs    12/13/17 0705 06/19/18 0000  WBC 5.3 5.4  NEUTROABS  --  2,560  HGB 14.3 14.9  HCT 42.5 44.7  MCV 88.9 91.6  PLT 238 220   Lipid Panel: Recent Labs    12/13/17 0705  CHOL 130  HDL 40*  LDLCALC 75  TRIG 69  CHOLHDL 3.3   No results found for:  HGBA1C  Procedures since last visit: No results found.  Assessment/Plan PVD (peripheral vascular disease) (HCC) Chronic redness BLE, brownish pigmented skin changes BLE. Right lateral malleolus scabbed over area persisted. Will apply Mepilex prn for protection.   HTN (hypertension) blood pressure is controlled, continue Dyazide 37.5/25mg  qd.  Paroxysmal atrial fibrillation (HCC) Afib, heart rate is in control, continue Digoxin 0.125mg  qd.   Acquired hypothyroidism continue Levothyroxine 61mcg qd, last TSH 4.3 06/19/18.  BPH (benign prostatic hyperplasia) no urinary retention, continue Avodart 0.5mg  qd.      Labs/tests ordered:  None  Next appt:  12/21/2018

## 2018-09-08 ENCOUNTER — Encounter: Payer: Self-pay | Admitting: Nurse Practitioner

## 2018-09-11 ENCOUNTER — Telehealth: Payer: Self-pay | Admitting: *Deleted

## 2018-09-11 NOTE — Telephone Encounter (Signed)
Patient called requesting ManXie Mast to write him a Rx for the bandage for his Ankle that she prescribed. Wants it sent to [facility]. Stated that you would know what it was since you prescribed it for him.

## 2018-09-12 ENCOUNTER — Other Ambulatory Visit: Payer: Self-pay | Admitting: *Deleted

## 2018-09-12 NOTE — Telephone Encounter (Signed)
Okay to place the order for him.

## 2018-09-12 NOTE — Telephone Encounter (Signed)
[facility] and spoke with [employer] [facility], she stated that they did carry Mepilex. She stated that she would send this to him today.

## 2018-12-06 ENCOUNTER — Emergency Department (HOSPITAL_COMMUNITY)
Admission: EM | Admit: 2018-12-06 | Discharge: 2018-12-06 | Disposition: A | Discharge status: Skilled Nursing Facility | Payer: Medicare Other | Attending: Emergency Medicine | Admitting: Emergency Medicine

## 2018-12-06 ENCOUNTER — Other Ambulatory Visit: Payer: Self-pay

## 2018-12-06 ENCOUNTER — Encounter (HOSPITAL_COMMUNITY): Payer: Self-pay

## 2018-12-06 DIAGNOSIS — R040 Epistaxis: Secondary | ICD-10-CM | POA: Diagnosis not present

## 2018-12-06 DIAGNOSIS — I129 Hypertensive chronic kidney disease with stage 1 through stage 4 chronic kidney disease, or unspecified chronic kidney disease: Secondary | ICD-10-CM | POA: Diagnosis not present

## 2018-12-06 DIAGNOSIS — I10 Essential (primary) hypertension: Secondary | ICD-10-CM | POA: Diagnosis not present

## 2018-12-06 DIAGNOSIS — Z7401 Bed confinement status: Secondary | ICD-10-CM | POA: Diagnosis not present

## 2018-12-06 DIAGNOSIS — Z8673 Personal history of transient ischemic attack (TIA), and cerebral infarction without residual deficits: Secondary | ICD-10-CM | POA: Diagnosis not present

## 2018-12-06 DIAGNOSIS — N183 Chronic kidney disease, stage 3 (moderate): Secondary | ICD-10-CM | POA: Diagnosis not present

## 2018-12-06 DIAGNOSIS — Z87891 Personal history of nicotine dependence: Secondary | ICD-10-CM | POA: Insufficient documentation

## 2018-12-06 DIAGNOSIS — R58 Hemorrhage, not elsewhere classified: Secondary | ICD-10-CM | POA: Diagnosis not present

## 2018-12-06 DIAGNOSIS — Z7902 Long term (current) use of antithrombotics/antiplatelets: Secondary | ICD-10-CM | POA: Insufficient documentation

## 2018-12-06 DIAGNOSIS — M2550 Pain in unspecified joint: Secondary | ICD-10-CM | POA: Diagnosis not present

## 2018-12-06 DIAGNOSIS — Z79899 Other long term (current) drug therapy: Secondary | ICD-10-CM | POA: Diagnosis not present

## 2018-12-06 MED ORDER — SILVER NITRATE-POT NITRATE 75-25 % EX MISC
1.0000 | Freq: Once | CUTANEOUS | Status: DC
  Filled 2018-12-06: qty 1

## 2018-12-06 MED ORDER — OXYMETAZOLINE HCL 0.05 % NA SOLN
1.0000 | Freq: Once | NASAL | Status: AC
  Administered 2018-12-06: 1 via NASAL
  Filled 2018-12-06: qty 30

## 2018-12-06 NOTE — Discharge Instructions (Addendum)
Avoid scratching your nose, pinch the front of your nose for 15 minutes if the bleeding restarts, return if the bleeding restarts and you are not able to control it with those measures

## 2018-12-06 NOTE — ED Provider Notes (Signed)
[facility] DEPT Provider Note   CSN: [id number] Arrival date & time: 12/06/18  1557    History   Chief Complaint Chief Complaint  Patient presents with  . Epistaxis    HPI Cody [name] is a 85 y.o. male.     HPI Patient presents to the emergency room for evaluation of a nosebleed.  Patient states he was scratching inside of his nose with his finger wrapped in a tissue.  He must of dislodge something because shortly after that he started having nasal bleeding.  Patient was unable to get the bleeding to stop.  He was brought into the emergency room for further treatment.  Patient denies any other symptoms of fevers chills lightheadedness.  In the ED the nurse applied some gauze inside his nose and subsequently the bleeding has stopped. Past Medical History:  Diagnosis Date  . Anginal pain ([place])    nuclear stress 03/12/13 EPIC  . Balance problem 07/17/2015  . Basal cell carcinoma    left forearm  . BPH (benign prostatic hyperplasia)   . Cataract, nuclear 08/08/2014  . 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 Cervical spondylosis without myelopathy 08/22/2015  . Diverticulosis   . Diverticulosis of colon without hemorrhage 08/22/2015  . Edema 08/18/2015  . Fall 07/17/2015  . Gait disturbance 08/22/2015  . GERD (gastroesophageal reflux disease)    occurs rarely  . History of CVA (cerebrovascular accident) 07/17/2015  . HLD (hyperlipidemia) 07/17/2015  . Hyperlipidemia   . Hypertension   . Inguinal hernia   . Left carotid bruit   . Melanoma ([place])    left forearm  . Osteopenia   . Paroxysmal atrial fibrillation (HCC)   . Physical deconditioning 08/22/2015  . Retinal hemorrhage 04/24/2014  . Rosacea blepharoconjunctivitis 11/20/2011  . Stroke ([place]) 3/07   affected balance  . Tubular adenoma of colon 2007  . Weakness 07/17/2015    Patient Active Problem List   Diagnosis Date Noted  . Angina pectoris ([place]) 06/22/2018  . CKD (chronic kidney disease)  stage 3, GFR 30-59 ml/min (HCC) 12/20/2017  . Paroxysmal atrial fibrillation ([place]) 12/20/2017  . Pressure ulcer of right ankle, stage 2 ([place]) 08/18/2017  . PVC (premature ventricular contraction) 03/22/2017  . Vitamin D deficiency 03/22/2017  . Acquired hypothyroidism 03/22/2017  . PVD (peripheral vascular disease) ([place]) 09/23/2016  . HTN (hypertension) 02/26/2016  . Dysphagia 02/26/2016  . Irregular heart beats 09/18/2015  . Cervical spondylosis without myelopathy 08/22/2015  . Physical deconditioning 08/22/2015  . Unsteady gait 08/22/2015  . Tubular adenoma of colon 08/22/2015  . Diverticulosis of colon without hemorrhage 08/22/2015  . Melanoma of skin ([place]) 08/22/2015  . Left carotid bruit 08/22/2015  . Osteopenia 08/22/2015  . GERD (gastroesophageal reflux disease) 08/22/2015  . Edema 08/18/2015  . Fall 07/17/2015  . Balance problem 07/17/2015  . History of CVA (cerebrovascular accident) 07/17/2015  . BPH (benign prostatic hyperplasia) 07/17/2015  . Hyperlipidemia LDL goal <70 07/17/2015  . Cataract, nuclear 08/08/2014  . 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 Inguinal hernia without mention of obstruction or gangrene, unilateral or unspecified, (not specified as recurrent)-right s/p repair with mesh 03/15/13. 01/30/2013    Past Surgical History:  Procedure Laterality Date  . COLONOSCOPY  2008  . EYE SURGERY Right    cataract extraction with IOL  . INGUINAL HERNIA REPAIR Right 03/15/2013   Procedure: HERNIA REPAIR INGUINAL ADULT;  Surgeon: Odis Hollingshead, MD;  Location: [facility]S;  Service: General;  Laterality: Right;  . INSERTION OF  MESH Right 03/15/2013   Procedure: INSERTION OF MESH;  Surgeon: Odis Hollingshead, MD;  Location: [facility];  Service: General;  Laterality: Right;  . left forearm melanoma    . PROSTATE BIOPSY    . TONSILLECTOMY  1933        Home Medications    Prior to Admission medications   Medication Sig Start Date End Date Taking? Authorizing Provider   cholecalciferol (VITAMIN D) 400 units TABS tablet Take 400 Units by mouth 2 (two) times daily.    [provider]  clopidogrel (PLAVIX) 75 MG tablet Take 75 mg by mouth daily. Start PLAVIX from 07/24/15 if no further hematuria is reported by patient and HB > 12.    [provider]  digoxin (LANOXIN) 0.125 MG tablet Take 0.125 mg by mouth daily.    [provider]  dutasteride (AVODART) 0.5 MG capsule Take 0.5 mg by mouth daily.     [provider]  levothyroxine (SYNTHROID, LEVOTHROID) 50 MCG tablet Take 50 mcg by mouth daily before breakfast.    [provider]  pravastatin (PRAVACHOL) 20 MG tablet Take 1 tablet by mouth at bedtime. 07/29/18   [provider]  triamterene-hydrochlorothiazide (DYAZIDE) 37.5-25 MG capsule Take 1 capsule by mouth daily.    [provider]    Family History Family History  Problem Relation Age of Onset  . Cancer Mother        lung and breast  . Heart disease Father        MI  . Cancer Sister        pancreatic    Social History Social History   Tobacco Use  . Smoking status: Former Smoker    Last attempt to quit: 11/05/1972    Years since quitting: 46.1  . Smokeless tobacco: Never Used  Substance Use Topics  . Alcohol use: No    Comment:  3 ounces wine nightly  . Drug use: No     Allergies   Aspirin and Eggs or egg-derived products   Review of Systems Review of Systems  All other systems reviewed and are negative.    Physical Exam Updated Vital Signs BP 121/72   Pulse 90   Temp 97.6 F (36.4 C) (Oral)   Resp 16   SpO2 96%   Physical Exam Vitals signs and nursing note reviewed.  Constitutional:      General: He is not in acute distress.    Appearance: He is well-developed.     Comments: Elderly, frail  HENT:     Head: Normocephalic and atraumatic.     Right Ear: External ear normal.     Left Ear: External ear normal.     Nose:     Comments: Fresh blood noted in  the left nares but no evidence of active bleeding Eyes:     General: No scleral icterus.       Right eye: No discharge.        Left eye: No discharge.     Conjunctiva/sclera: Conjunctivae normal.  Neck:     Musculoskeletal: Neck supple.     Trachea: No tracheal deviation.  Cardiovascular:     Rate and Rhythm: Normal rate.  Pulmonary:     Effort: Pulmonary effort is normal. No respiratory distress.     Breath sounds: No stridor.  Abdominal:     General: There is no distension.  Musculoskeletal:        General: No swelling or deformity.  Skin:  General: Skin is warm and dry.     Findings: No rash.  Neurological:     Mental Status: He is alert.     Cranial Nerves: Cranial nerve deficit: no gross deficits.      ED Treatments / Results  Labs (all labs ordered are listed, but only abnormal results are displayed) Labs Reviewed - No data to display  EKG None  Radiology No results found.  Procedures .Epistaxis Management Date/Time: 12/06/2018 5:56 PM Performed by: Dorie Rank, MD Authorized by: Dorie Rank, MD   Consent:    Consent obtained:  Verbal   Consent given by:  Patient   Risks discussed:  Bleeding and infection   Alternatives discussed:  No treatment Anesthesia (see MAR for exact dosages):    Anesthesia method:  None Procedure details:    Treatment site:  L anterior   Repair method: cotton ball with afrin.   Treatment complexity:  Limited   Treatment episode: initial   Post-procedure details:    Patient tolerance of procedure:  Tolerated well, no immediate complications Comments:     Bleeding had already stopped   (including critical care time)  Medications Ordered in ED Medications  oxymetazoline (AFRIN) 0.05 % nasal spray 1 spray (has no administration in time range)  silver nitrate applicators applicator 1 Stick (has no administration in time range)     Initial Impression / Assessment and Plan / ED Course  I have reviewed the triage vital  signs and the nursing notes.  Pertinent labs & imaging results that were available during my care of the patient were reviewed by me and considered in my medical decision making (see chart for details).   Patient presents with epistaxis.  Symptoms had resolved prior to my arrival.  Cotton ball with Afrin was placed in the anterior nares.  Patient was observed.  Her remove the cotton ball and was unable to see any obvious source vessel.  Patient appears stable for discharge.  Discussed avoiding scratching his nose.  Final Clinical Impressions(s) / ED Diagnoses   Final diagnoses:  Epistaxis    ED Discharge Orders    None       Dorie Rank, MD 12/06/18 1758

## 2018-12-06 NOTE — ED Triage Notes (Addendum)
Pt BIB EMS from [facility]. Pts nose started bleeding about an hour ago after pt attempting to clear a blockage with tissue. Pts nose is still bleeding. Pt coughed up a clot with EMS. Pt is on Plavix. Pt denies any other symptoms. A&O x4. EMS did attempt to use Afrin.   BP 146/82 HR 95 96% RA

## 2018-12-06 NOTE — ED Notes (Signed)
Bed: YS16 Expected date:  Expected time:  Means of arrival:  Comments: EMS 85 year old

## 2018-12-21 ENCOUNTER — Other Ambulatory Visit: Payer: Self-pay

## 2018-12-21 ENCOUNTER — Non-Acute Institutional Stay: Payer: Medicare Other | Admitting: Nurse Practitioner

## 2018-12-21 ENCOUNTER — Encounter: Payer: Self-pay | Admitting: Nurse Practitioner

## 2018-12-21 DIAGNOSIS — N400 Enlarged prostate without lower urinary tract symptoms: Secondary | ICD-10-CM | POA: Diagnosis not present

## 2018-12-21 DIAGNOSIS — E039 Hypothyroidism, unspecified: Secondary | ICD-10-CM

## 2018-12-21 DIAGNOSIS — E785 Hyperlipidemia, unspecified: Secondary | ICD-10-CM | POA: Diagnosis not present

## 2018-12-21 DIAGNOSIS — I480 Paroxysmal atrial fibrillation: Secondary | ICD-10-CM | POA: Diagnosis not present

## 2018-12-21 DIAGNOSIS — I10 Essential (primary) hypertension: Secondary | ICD-10-CM | POA: Diagnosis not present

## 2018-12-21 NOTE — Assessment & Plan Note (Signed)
Stable, chronic, no urinary retention, continue Avodart 0.5 mg qd.

## 2018-12-21 NOTE — Assessment & Plan Note (Addendum)
Heart rate is in control, continue Digoxin 0.125mg  qd, update dig level. Continue Plavix 75mg  qd

## 2018-12-21 NOTE — Patient Instructions (Addendum)
CBC/diff, CMp/eGFR, TSH, lipid panel, Dig level 12/26/18 and prior to the next appointment. F/u with Dr. Lyndel Safe in 6 months. Prenvar is pending.

## 2018-12-21 NOTE — Assessment & Plan Note (Signed)
On Pravachol 20mg  qd, update lipid panel. Last LDL 53 08/26/15

## 2018-12-21 NOTE — Progress Notes (Signed)
Location:   clinic [facility]   Place of Service:   clinic Provider: Marlana Latus NP  Code Status: DNR Goals of Care: [place] 12/06/2018  Does Patient Have a Medical Advance Directive? Yes  Type of Paramedic of [place];Living will;Out of facility DNR (pink MOST or yellow form)  Does patient want to make changes to medical advance directive? No - Patient declined  Copy of [place] in Chart? -  Pre-existing out of facility DNR order (yellow form or pink MOST form) -     Chief Complaint  Patient presents with  . Medical Management of Chronic Issues    6 month follow up    HPI: Patient is a 85 y.o. male seen today for medical management of chronic diseases.    The patient has history of HTN, blood pressure is controlled on Dyazide 37.5/25mg. AFib, heart rate is in control, on Digoxin 0.175m qd, Plavix 73mqd. Hyperlipidemia, on Pravachol 2081md. Hypothyroidism, on Levothyroxine 22m51md last TSH 4.3 06/20/19. BPH, no urinary retention, on Avodart 0.5mg 67m    Past Medical History:  Diagnosis Date  . Anginal pain (HCC) [place] stress 03/12/13 EPIC  . Balance problem 07/17/2015  . Basal cell carcinoma    left forearm  . BPH (benign prostatic hyperplasia)   . Cataract, nuclear 08/08/2014  . 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 Cervical spondylosis without myelopathy 08/22/2015  . Diverticulosis   . Diverticulosis of colon without hemorrhage 08/22/2015  . Edema 08/18/2015  . Fall 07/17/2015  . Gait disturbance 08/22/2015  . GERD (gastroesophageal reflux disease)    occurs rarely  . History of CVA (cerebrovascular accident) 07/17/2015  . HLD (hyperlipidemia) 07/17/2015  . Hyperlipidemia   . Hypertension   . Inguinal hernia   . Left carotid bruit   . Melanoma (HCC) [place]  . Osteopenia   . Paroxysmal atrial fibrillation (HCC)   . Physical deconditioning 08/22/2015  . Retinal hemorrhage 04/24/2014  . Rosacea  blepharoconjunctivitis 11/20/2011  . Stroke (HCC) [place]   affected balance  . Tubular adenoma of colon 2007  . Weakness 07/17/2015    Past Surgical History:  Procedure Laterality Date  . COLONOSCOPY  2008  . EYE SURGERY Right    cataract extraction with IOL  . INGUINAL HERNIA REPAIR Right 03/15/2013   Procedure: HERNIA REPAIR INGUINAL ADULT;  Surgeon: Todd Odis Hollingshead  Location: [facility];  Service: General;  Laterality: Right;  . INSERTION OF MESH Right 03/15/2013   Procedure: INSERTION OF MESH;  Surgeon: Todd Odis Hollingshead  Location: [facility];  Service: General;  Laterality: Right;  . left forearm melanoma    . PROSTATE BIOPSY    . TONSILLECTOMY  1933    Allergies  Allergen Reactions  . Aspirin   . Eggs Or Egg-Derived Products Swelling    throat    Allergies as of 12/21/2018      Reactions   Aspirin    Eggs Or Egg-derived Products Swelling   throat      Medication List       Accurate as of December 21, 2018 11:59 PM. If you have any questions, ask your nurse or doctor.        cholecalciferol 10 MCG (400 UNIT) Tabs tablet Commonly known as: VITAMIN D3 Take 400 Units by mouth 2 (two) times daily.   clopidogrel 75 MG tablet Commonly known as: PLAVIX Take 75 mg by mouth daily.  Start PLAVIX from 07/24/15 if no further hematuria is reported by patient and HB > 12.   dutasteride 0.5 MG capsule Commonly known as: AVODART Take 0.5 mg by mouth daily.   Lanoxin 0.125 MG tablet Generic drug: digoxin Take 0.125 mg by mouth daily.   levothyroxine 50 MCG tablet Commonly known as: SYNTHROID Take 50 mcg by mouth daily before breakfast.   pravastatin 20 MG tablet Commonly known as: PRAVACHOL Take 1 tablet by mouth at bedtime.   triamterene-hydrochlorothiazide 37.5-25 MG capsule Commonly known as: DYAZIDE Take 1 capsule by mouth daily.       Review of Systems:  Review of Systems  Constitutional: Negative for activity change, appetite change, chills, diaphoresis,  fatigue and fever.  HENT: Positive for hearing loss. Negative for congestion and voice change.   Respiratory: Negative for cough, shortness of breath and wheezing.   Cardiovascular: Positive for leg swelling. Negative for chest pain and palpitations.  Gastrointestinal: Negative for abdominal distention, abdominal pain, constipation, diarrhea, nausea and vomiting.  Genitourinary: Positive for frequency. Negative for difficulty urinating, dysuria and urgency.  Musculoskeletal: Positive for gait problem.  Skin: Negative for color change and pallor.  Neurological: Negative for dizziness, speech difficulty, weakness and headaches.  Psychiatric/Behavioral: Negative for agitation, behavioral problems, hallucinations and sleep disturbance. The patient is not nervous/anxious.     Health Maintenance  Topic Date Due  . TETANUS/TDAP  07/15/2025  . INFLUENZA VACCINE  Discontinued  . PNA vac Low Risk Adult  Discontinued    Physical Exam: Vitals:   12/21/18 1358  BP: 118/80  Pulse: (!) 112  Temp: 97.8 F (36.6 C)  SpO2: 98%  Height: _0  (1.854 m)   Body mass index is 19.55 kg/m. Physical Exam Vitals signs reviewed.  Constitutional:      General: He is not in acute distress.    Appearance: Normal appearance. He is not ill-appearing, toxic-appearing or diaphoretic.  HENT:     Head: Normocephalic and atraumatic.     Nose: Nose normal. No congestion or rhinorrhea.     Mouth/Throat:     Mouth: Mucous membranes are moist.  Eyes:     Extraocular Movements: Extraocular movements intact.     Conjunctiva/sclera: Conjunctivae normal.     Pupils: Pupils are equal, round, and reactive to light.  Neck:     Musculoskeletal: Normal range of motion and neck supple.  Cardiovascular:     Rate and Rhythm: Normal rate and regular rhythm.     Heart sounds: No murmur.  Pulmonary:     Effort: Pulmonary effort is normal.     Breath sounds: No wheezing, rhonchi or rales.  Chest:     Chest wall: No  tenderness.  Abdominal:     General: Bowel sounds are normal. There is no distension.     Palpations: Abdomen is soft.     Tenderness: There is no abdominal tenderness. There is no right CVA tenderness, left CVA tenderness, guarding or rebound.  Musculoskeletal:     Right lower leg: Edema present.     Left lower leg: Edema present.     Comments: Trace edema BLE. W/c for mobility.   Skin:    General: Skin is warm and dry.     Comments: Chronic venous insufficiency skin changes BLE  Neurological:     General: No focal deficit present.     Mental Status: He is alert and oriented to person, place, and time. Mental status is at baseline.     Cranial Nerves:  No cranial nerve deficit.     Motor: No weakness.     Coordination: Coordination normal.     Gait: Gait abnormal.  Psychiatric:        Mood and Affect: Mood normal.        Behavior: Behavior normal.        Thought Content: Thought content normal.        Judgment: Judgment normal.     Labs reviewed: Basic Metabolic Panel: Recent Labs    03/20/18 0000 04/18/18 0710 06/19/18 0000  NA  --   --  141  K  --   --  4.0  CL  --   --  102  CO2  --   --  29  GLUCOSE  --   --  100*  BUN  --   --  33*  CREATININE  --   --  1.30*  CALCIUM  --   --  9.4  TSH 4.72* 3.82 4.30   Liver Function Tests: Recent Labs    06/19/18 0000  AST 14  ALT 10  BILITOT 0.5  PROT 6.6   No results for input(s): LIPASE, AMYLASE in the last 8760 hours. No results for input(s): AMMONIA in the last 8760 hours. CBC: Recent Labs    06/19/18 0000  WBC 5.4  NEUTROABS 2,560  HGB 14.9  HCT 44.7  MCV 91.6  PLT 220   Lipid Panel: No results for input(s): CHOL, HDL, LDLCALC, TRIG, CHOLHDL, LDLDIRECT in the last 8760 hours. No results found for: HGBA1C  Procedures since last visit: No results found.  Assessment/Plan  HTN (hypertension) Blood pressure is controlled, continue Dyazid 37.5/25mg qd, update CMP/eGFR  Paroxysmal atrial  fibrillation (HCC) Heart rate is in control, continue Digoxin 0.166m qd, update dig level. Continue Plavix 740mqd  Acquired hypothyroidism Stable, continue Levothyroxine 5044mqd, last TSH 4.3 wnl 05/2018. Update TSH  BPH (benign prostatic hyperplasia) Stable, chronic, no urinary retention, continue Avodart 0.5 mg qd.   Hyperlipidemia LDL goal <70 On Pravachol 74m17m, update lipid panel. Last LDL 53 08/26/15   Labs/tests ordered:  Dig level, CBC/diff, CMp/eGFR, TSH, lipid panel prior to the next appointment.   Next appt:  F/u with Dr. GuptLyndel Safe6 months.

## 2018-12-21 NOTE — Assessment & Plan Note (Signed)
Stable, continue Levothyroxine 40mcg qd, last TSH 4.3 wnl 05/2018. Update TSH

## 2018-12-21 NOTE — Assessment & Plan Note (Signed)
Blood pressure is controlled, continue Dyazid 37.5/25mg qd, update CMP/eGFR

## 2018-12-22 ENCOUNTER — Encounter: Payer: Self-pay | Admitting: Nurse Practitioner

## 2018-12-25 NOTE — Addendum Note (Signed)
Addended by: Nancyann Cotterman X on: 12/25/2018 05:11 PM   Modules accepted: Level of Service

## 2018-12-26 ENCOUNTER — Other Ambulatory Visit: Payer: Medicare Other

## 2018-12-26 ENCOUNTER — Other Ambulatory Visit: Payer: Self-pay | Admitting: Nurse Practitioner

## 2018-12-26 ENCOUNTER — Other Ambulatory Visit: Payer: Self-pay

## 2018-12-26 DIAGNOSIS — N400 Enlarged prostate without lower urinary tract symptoms: Secondary | ICD-10-CM

## 2018-12-26 DIAGNOSIS — I10 Essential (primary) hypertension: Secondary | ICD-10-CM | POA: Diagnosis not present

## 2018-12-26 DIAGNOSIS — E785 Hyperlipidemia, unspecified: Secondary | ICD-10-CM | POA: Diagnosis not present

## 2018-12-26 DIAGNOSIS — E039 Hypothyroidism, unspecified: Secondary | ICD-10-CM | POA: Diagnosis not present

## 2018-12-26 DIAGNOSIS — I480 Paroxysmal atrial fibrillation: Secondary | ICD-10-CM | POA: Diagnosis not present

## 2018-12-27 LAB — COMPLETE METABOLIC PANEL WITH GFR
AG Ratio: 1.6 (calc) (ref 1.0–2.5)
ALT: 9 U/L (ref 9–46)
AST: 13 U/L (ref 10–35)
Albumin: 3.9 g/dL (ref 3.6–5.1)
Alkaline phosphatase (APISO): 52 U/L (ref 35–144)
BUN/Creatinine Ratio: 29 (calc) — ABNORMAL HIGH (ref 6–22)
BUN: 32 mg/dL — ABNORMAL HIGH (ref 7–25)
CO2: 32 mmol/L (ref 20–32)
Calcium: 9.3 mg/dL (ref 8.6–10.3)
Chloride: 100 mmol/L (ref 98–110)
Creat: 1.11 mg/dL (ref 0.70–1.11)
GFR, Est African American: 67 mL/min/{1.73_m2} (ref >=60)
GFR, Est Non African American: 58 mL/min/{1.73_m2} — ABNORMAL LOW (ref >=60)
Globulin: 2.5 g/dL (calc) (ref 1.9–3.7)
Glucose, Bld: 94 mg/dL (ref 65–99)
Potassium: 4.2 mmol/L (ref 3.5–5.3)
Sodium: 139 mmol/L (ref 135–146)
Total Bilirubin: 0.8 mg/dL (ref 0.2–1.2)
Total Protein: 6.4 g/dL (ref 6.1–8.1)

## 2018-12-27 LAB — CBC WITH DIFFERENTIAL/PLATELET
Absolute Monocytes: 432 cells/uL (ref 200–950)
Basophils Absolute: 89 cells/uL (ref 0–200)
Basophils Relative: 1.9 %
Eosinophils Absolute: 423 cells/uL (ref 15–500)
Eosinophils Relative: 9 %
HCT: 43.8 % (ref 38.5–50.0)
Hemoglobin: 14.6 g/dL (ref 13.2–17.1)
Lymphs Abs: 1579 cells/uL (ref 850–3900)
MCH: 31.1 pg (ref 27.0–33.0)
MCHC: 33.3 g/dL (ref 32.0–36.0)
MCV: 93.2 fL (ref 80.0–100.0)
MPV: 11.2 fL (ref 7.5–12.5)
Monocytes Relative: 9.2 %
Neutro Abs: 2176 cells/uL (ref 1500–7800)
Neutrophils Relative %: 46.3 %
Platelets: 226 10*3/uL (ref 140–400)
RBC: 4.7 10*6/uL (ref 4.20–5.80)
RDW: 12.8 % (ref 11.0–15.0)
Total Lymphocyte: 33.6 %
WBC: 4.7 10*3/uL (ref 3.8–10.8)

## 2018-12-27 LAB — LIPID PANEL
Cholesterol: 132 mg/dL (ref <200)
HDL: 46 mg/dL (ref >=40)
LDL Cholesterol (Calc): 72 mg/dL (calc)
Non-HDL Cholesterol (Calc): 86 mg/dL (calc) (ref <130)
Total CHOL/HDL Ratio: 2.9 (calc) (ref <5.0)
Triglycerides: 63 mg/dL (ref <150)

## 2018-12-27 LAB — TSH: TSH: 3.78 mIU/L (ref 0.40–4.50)

## 2018-12-27 LAB — DIGOXIN LEVEL: Digoxin Level: 0.9 mcg/L (ref 0.8–2.0)

## 2019-02-22 ENCOUNTER — Ambulatory Visit: Payer: Self-pay | Admitting: Nurse Practitioner

## 2019-02-22 ENCOUNTER — Encounter: Payer: Self-pay | Admitting: Nurse Practitioner

## 2019-02-22 ENCOUNTER — Non-Acute Institutional Stay: Payer: Medicare Other | Admitting: Nurse Practitioner

## 2019-02-22 ENCOUNTER — Other Ambulatory Visit: Payer: Self-pay

## 2019-02-22 VITALS — BP 138/70 | HR 79 | Temp 96.0°F | Resp 18 | Ht 73.0 in | Wt 134.4 lb

## 2019-02-22 DIAGNOSIS — Z0000 Encounter for general adult medical examination without abnormal findings: Secondary | ICD-10-CM | POA: Diagnosis not present

## 2019-02-22 NOTE — Progress Notes (Signed)
Subjective:   Cody [name] is a 85 y.o. male who presents for Medicare Annual/Subsequent preventive examination at clinic [place].      Objective:    Vitals: BP 138/70   Pulse 79   Temp (!) 96 F (35.6 C)   Resp 18   Ht 6\' 1"  (1.854 m)   Wt 134 lb 6.4 oz (61 kg)   SpO2 98%   BMI 17.73 kg/m   Body mass index is 17.73 kg/m.  Advanced Directives 12/06/2018 06/22/2018 12/20/2017 11/29/2017 09/23/2016 08/19/2016 03/24/2016  Does Patient Have a Medical Advance Directive? Yes Yes Yes Yes Yes Yes Yes  Type of Paramedic of [place];Living will;Out of facility DNR (pink MOST or yellow form) [place];Out of facility DNR (pink MOST or yellow form) [place];Out of facility DNR (pink MOST or yellow form) [place];Out of facility DNR (pink MOST or yellow form) [place];Out of facility DNR (pink MOST or yellow form) [place];Out of facility DNR (pink MOST or yellow form) [place];Out of facility DNR (pink MOST or yellow form)  Does patient want to make changes to medical advance directive? No - Patient declined No - Patient declined No - Patient declined No - Patient declined - No - Patient declined No - Patient declined  Copy of [place] in Chart? - Yes - validated most recent copy scanned in chart (See row information) Yes Yes Yes Yes Yes  Pre-existing out of facility DNR order (yellow form or pink MOST form) - Yellow form placed in chart (order not valid for inpatient use) Yellow form placed in chart (order not valid for inpatient use) Yellow form placed in chart (order not valid for inpatient use) Yellow form placed in chart (order not valid for inpatient use) Yellow form placed in chart (order not valid for inpatient use) -    Tobacco Social History   Tobacco Use  Smoking Status Former Smoker  . Quit date: 11/05/1972  .  Years since quitting: 46.3  Smokeless Tobacco Never Used     Counseling given: Not Answered   Clinical Intake:  Pre-visit preparation completed: Yes  Pain : No/denies pain     Nutritional Status: BMI <19  Underweight Nutritional Risks: Unintentional weight loss(no change of appetite, denied GI symptoms, decelined dietary consult. the patient does watch diet closely.) Diabetes: No  How often do you need to have someone help you when you read instructions, pamphlets, or other written materials from your doctor or pharmacy?: 1 - Never What is the last grade level you completed in school?: college  Interpreter Needed?: No  Information entered by :: Denese Mentink Bretta Bang NP  Past Medical History:  Diagnosis Date  . Anginal pain ([place])    nuclear stress 03/12/13 EPIC  . Balance problem 07/17/2015  . Basal cell carcinoma    left forearm  . BPH (benign prostatic hyperplasia)   . Cataract, nuclear 08/08/2014  . 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 Cervical spondylosis without myelopathy 08/22/2015  . Diverticulosis   . Diverticulosis of colon without hemorrhage 08/22/2015  . Edema 08/18/2015  . Fall 07/17/2015  . Gait disturbance 08/22/2015  . GERD (gastroesophageal reflux disease)    occurs rarely  . History of CVA (cerebrovascular accident) 07/17/2015  . HLD (hyperlipidemia) 07/17/2015  . Hyperlipidemia   . Hypertension   . Inguinal hernia   . Left carotid bruit   . Melanoma (  [place])    left forearm  . Osteopenia   . Paroxysmal atrial fibrillation (HCC)   . Physical deconditioning 08/22/2015  . Retinal hemorrhage 04/24/2014  . Rosacea blepharoconjunctivitis 11/20/2011  . Stroke ([place]) 3/07   affected balance  . Tubular adenoma of colon 2007  . Weakness 07/17/2015   Past Surgical History:  Procedure Laterality Date  . COLONOSCOPY  2008  . EYE SURGERY Right    cataract extraction with IOL  . INGUINAL HERNIA REPAIR Right 03/15/2013   Procedure: HERNIA REPAIR INGUINAL ADULT;  Surgeon:  Odis Hollingshead, MD;  Location: [facility];  Service: General;  Laterality: Right;  . INSERTION OF MESH Right 03/15/2013   Procedure: INSERTION OF MESH;  Surgeon: Odis Hollingshead, MD;  Location: [facility];  Service: General;  Laterality: Right;  . left forearm melanoma    . PROSTATE BIOPSY    . TONSILLECTOMY  1933   Family History  Problem Relation Age of Onset  . Cancer Mother        lung and breast  . Heart disease Father        MI  . Cancer Sister        pancreatic   Social History   Socioeconomic History  . Marital status: Divorced    Spouse name: Not on file  . Number of children: Not on file  . Years of education: Not on file  . Highest education level: Not on file  Occupational History  . Occupation: Retired Press photographer    Comment: Sales  Social Needs  . Financial resource strain: Not on file  . Food insecurity    Worry: Not on file    Inability: Not on file  . Transportation needs    Medical: Not on file    Non-medical: Not on file  Tobacco Use  . Smoking status: Former Smoker    Quit date: 11/05/1972    Years since quitting: 46.3  . Smokeless tobacco: Never Used  Substance and Sexual Activity  . Alcohol use: No    Comment:  3 ounces wine nightly  . Drug use: No  . Sexual activity: Not on file  Lifestyle  . Physical activity    Days per week: Not on file    Minutes per session: Not on file  . Stress: Not on file  Relationships  . Social Herbalist on phone: Not on file    Gets together: Not on file    Attends religious service: Not on file    Active member of club or organization: Not on file    Attends meetings of clubs or organizations: Not on file    Relationship status: Not on file  Other Topics Concern  . Not on file  Social History Narrative   Diet? Regular/normal      Do you drink/eat things with caffeine? no      Marital status?      divorced                              What year were you married? 1957      Do you live in a house,  apartment, assisted living, condo, trailer, etc.? Apartment. Moved to [place] 08/21/2015      Is it one or more stories? 2      How many persons live in your home? Live alone      Do you have any pets in  your home? (please list) no      Current or past profession: Sales      Do you exercise?      "I do now."                            Type & how often? Exercise bike, 3 times weekly      Do you have a living will? no      Do you have a DNR form?    yes                              If not, do you want to discuss one?      Do you have signed POA/HPOA for forms?                 Outpatient Encounter Medications as of 02/22/2019  Medication Sig  . cholecalciferol (VITAMIN D) 400 units TABS tablet Take 400 Units by mouth 2 (two) times daily.  . clopidogrel (PLAVIX) 75 MG tablet Take 75 mg by mouth daily. Start PLAVIX from 07/24/15 if no further hematuria is reported by patient and HB > 12.  . digoxin (LANOXIN) 0.125 MG tablet Take 0.125 mg by mouth daily.  Marland Kitchen dutasteride (AVODART) 0.5 MG capsule Take 0.5 mg by mouth daily.   Marland Kitchen levothyroxine (SYNTHROID, LEVOTHROID) 50 MCG tablet Take 50 mcg by mouth daily before breakfast.  . pravastatin (PRAVACHOL) 20 MG tablet Take 1 tablet by mouth at bedtime.  . triamterene-hydrochlorothiazide (DYAZIDE) 37.5-25 MG capsule Take 1 capsule by mouth daily.  . nitroGLYCERIN (NITROSTAT) 0.4 MG SL tablet Place 1 tablet under the tongue as needed.   No facility-administered encounter medications on file as of 02/22/2019.     Activities of Daily Living In your present state of health, do you have any difficulty performing the following activities: 02/22/2019  Hearing? N  Comment if speaker takes off mask, face shield  Vision? N  Difficulty concentrating or making decisions? N  Walking or climbing stairs? Y  Comment the patient uses wheelchair to get around.  Dressing or bathing? N  Doing errands, shopping? N  Preparing Food and eating ? Y  Comment 1/3 of the  time coughing during eating, relieves after resting a few minutes  Using the Toilet? N  Comment with grab bar  In the past six months, have you accidently leaked urine? Y  Do you have problems with loss of bowel control? N  Managing your Medications? N  Managing your Finances? N  Housekeeping or managing your Housekeeping? N  Some recent data might be hidden    Patient Care Team: Fallen Crisostomo X, NP as PCP - General (Internal Medicine) Danella Sensing, MD as Consulting Physician (Dermatology) Jaire Pinkham X, NP as Nurse Practitioner (Internal Medicine)   Assessment:   This is a routine wellness examination for [place].  Exercise Activities and Dietary recommendations Time (Minutes): 10, Frequency (Times/Week): 2, Weekly Exercise (Minutes/Week): 20, Intensity: Mild  Goals    . DIET - INCREASE WATER INTAKE    . Increase physical activity       Fall Risk Fall Risk  12/21/2018 09/07/2018 05/16/2018 06/14/2017 02/26/2016  Falls in the past year? 0 0 0 No Yes  Comment - - Emmi Telephone Survey: data to providers prior to load - -  Number falls in past yr: 0 0 - - 1  Comment - - - -  07/16/15  Injury with Fall? 0 0 - - Yes  Comment - - - - laceration on head   Is the patient's home free of loose throw rugs in walkways, pet beds, electrical cords, etc?   yes      Grab bars in the bathroom? yes      Handrails on the stairs?   yes      Adequate lighting?   yes  Timed Get Up and Go Performed: unable to perform due to CVA 13 years ago.   Depression Screen PHQ 2/9 Scores 09/07/2018 06/14/2017 02/26/2016 11/06/2015  PHQ - 2 Score 0 0 0 0    Cognitive Function MMSE - Mini Mental State Exam 02/22/2019 03/22/2017  Not completed: (No Data) (No Data)  Orientation to time 5 5  Orientation to Place 5 5  Registration 3 3  Attention/ Calculation 4 5  Recall 3 3  Language- name 2 objects 2 2  Language- repeat 1 1  Language- follow 3 step command 1 3  Language- read & follow direction 1 1  Write a  sentence 1 1  Copy design 1 1  Total score 27 30        Immunization History  Administered Date(s) Administered  . PPD Test 07/21/2015, 08/04/2015  . Tdap 07/16/2015    Qualifies for Shingles Vaccine? The patient stated he had Shingle vac about 4 years ago  Screening Tests Health Maintenance  Topic Date Due  . TETANUS/TDAP  07/15/2025  . INFLUENZA VACCINE  Discontinued  . PNA vac Low Risk Adult  Discontinued   Cancer Screenings: Lung: Low Dose CT Chest recommended if Age 50-80 years, 30 pack-year currently smoking OR have quit w/in 15years. Patient does not  qualify Colorectal: does not  Additional Screenings: No Hepatitis C Screening:      Plan:   None I have personally reviewed and noted the following in the patient's chart:   . Medical and social history . Use of alcohol, tobacco or illicit drugs  . Current medications and supplements . Functional ability and status . Nutritional status . Physical activity . Advanced directives . List of other physicians . Hospitalizations, surgeries, and ER visits in previous 12 months . Vitals . Screenings to include cognitive, depression, and falls . Referrals and appointments  In addition, I have reviewed and discussed with patient certain preventive protocols, quality metrics, and best practice recommendations. A written personalized care plan for preventive services as well as general preventive health recommendations were provided to patient.     Jurline Folger X Asani Deniston, NP  02/22/2019

## 2019-02-22 NOTE — Patient Instructions (Signed)
No recommendation for today's visit.

## 2019-06-15 ENCOUNTER — Ambulatory Visit: Payer: Self-pay | Admitting: Internal Medicine

## 2019-06-15 ENCOUNTER — Other Ambulatory Visit: Payer: Self-pay

## 2019-06-15 ENCOUNTER — Encounter: Payer: Medicare Other | Admitting: Internal Medicine

## 2019-06-15 NOTE — Progress Notes (Signed)
A user error has taken place.

## 2019-07-02 DIAGNOSIS — Z23 Encounter for immunization: Secondary | ICD-10-CM | POA: Diagnosis not present

## 2019-07-06 ENCOUNTER — Non-Acute Institutional Stay: Payer: Medicare Other | Admitting: Internal Medicine

## 2019-07-06 ENCOUNTER — Encounter: Payer: Self-pay | Admitting: Internal Medicine

## 2019-07-06 ENCOUNTER — Other Ambulatory Visit: Payer: Self-pay

## 2019-07-06 VITALS — BP 116/76 | HR 77 | Temp 96.4°F | Ht 73.0 in | Wt 133.4 lb

## 2019-07-06 DIAGNOSIS — E785 Hyperlipidemia, unspecified: Secondary | ICD-10-CM | POA: Diagnosis not present

## 2019-07-06 DIAGNOSIS — I480 Paroxysmal atrial fibrillation: Secondary | ICD-10-CM

## 2019-07-06 DIAGNOSIS — I739 Peripheral vascular disease, unspecified: Secondary | ICD-10-CM | POA: Diagnosis not present

## 2019-07-06 DIAGNOSIS — E039 Hypothyroidism, unspecified: Secondary | ICD-10-CM

## 2019-07-06 DIAGNOSIS — I10 Essential (primary) hypertension: Secondary | ICD-10-CM | POA: Diagnosis not present

## 2019-07-06 DIAGNOSIS — N400 Enlarged prostate without lower urinary tract symptoms: Secondary | ICD-10-CM

## 2019-07-06 DIAGNOSIS — R0789 Other chest pain: Secondary | ICD-10-CM | POA: Diagnosis not present

## 2019-07-06 NOTE — Progress Notes (Signed)
Location:  [place] of Service:  Clinic (12)  Provider:   Code Status:  Goals of Care:  Advanced Directives 12/06/2018  Does Patient Have a Medical Advance Directive? Yes  Type of Paramedic of [place];Living will;Out of facility DNR (pink MOST or yellow form)  Does patient want to make changes to medical advance directive? No - Patient declined  Copy of [place] in Chart? -  Pre-existing out of facility DNR order (yellow form or pink MOST form) -     Chief Complaint  Patient presents with  . Medical Management of Chronic Issues    6 month follow up    HPI: Patient is a 86 y.o. male seen today for medical management of chronic diseases. He has h/o PAF, Hypertension, Hyperlipidemia, BPH, H/o CVA  His Active Problems  Atypical Chest Pain Patient has a history of atypical chest pain.  He states he has never seen a cardiologist before. But he did have Stress test done in 2014 which Showed fixed Inferior Defect but no ischemia He has been taking sublingual nitroglycerin for many years.  He  usually needs it every 3 to 4 months.  But recently  has been using it every month.  His pain is very atypical mostly on the left side.  He says mostly discomfort.  Not related to exertion.  No diaphoresis. No SOB Has not woken him up from sleep.  He states he usually takes 3 nitroglycerin to help his pain/discomfort H/o PAF He is on digoxin for rate control.  Does take Plavix.  States he was told not to take any blood thinner But per Dr Bubba Camp note he refused to take Blood thinners H/o CVA On Plavix and statin Dependent on wheel chair for his long distance and Uses Walker in his Apartment BPH NO Issues on Meds  Patient lives by himself. Has not had any falls. Independent his ADLS  Past Medical History:  Diagnosis Date  . Anginal pain ([place])    nuclear stress 03/12/13 EPIC  . Balance problem 07/17/2015  . Basal cell  carcinoma    left forearm  . BPH (benign prostatic hyperplasia)   . Cataract, nuclear 08/08/2014  . 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 Cervical spondylosis without myelopathy 08/22/2015  . Diverticulosis   . Diverticulosis of colon without hemorrhage 08/22/2015  . Edema 08/18/2015  . Fall 07/17/2015  . Gait disturbance 08/22/2015  . GERD (gastroesophageal reflux disease)    occurs rarely  . History of CVA (cerebrovascular accident) 07/17/2015  . HLD (hyperlipidemia) 07/17/2015  . Hyperlipidemia   . Hypertension   . Inguinal hernia   . Left carotid bruit   . Melanoma ([place])    left forearm  . Osteopenia   . Paroxysmal atrial fibrillation (HCC)   . Physical deconditioning 08/22/2015  . Retinal hemorrhage 04/24/2014  . Rosacea blepharoconjunctivitis 11/20/2011  . Stroke ([place]) 3/07   affected balance  . Tubular adenoma of colon 2007  . Weakness 07/17/2015    Past Surgical History:  Procedure Laterality Date  . COLONOSCOPY  2008  . EYE SURGERY Right    cataract extraction with IOL  . INGUINAL HERNIA REPAIR Right 03/15/2013   Procedure: HERNIA REPAIR INGUINAL ADULT;  Surgeon: Odis Hollingshead, MD;  Location: [facility];  Service: General;  Laterality: Right;  . INSERTION OF MESH Right 03/15/2013   Procedure: INSERTION OF MESH;  Surgeon: Odis Hollingshead, MD;  Location: [facility];  Service:  General;  Laterality: Right;  . left forearm melanoma    . PROSTATE BIOPSY    . TONSILLECTOMY  1933    Allergies  Allergen Reactions  . Aspirin   . Eggs Or Egg-Derived Products Swelling    throat    Outpatient Encounter Medications as of 07/06/2019  Medication Sig  . cholecalciferol (VITAMIN D) 400 units TABS tablet Take 400 Units by mouth 2 (two) times daily.  . clopidogrel (PLAVIX) 75 MG tablet Take 75 mg by mouth daily. Start PLAVIX from 07/24/15 if no further hematuria is reported by patient and HB > 12.  . digoxin (LANOXIN) 0.125 MG tablet Take 0.125 mg by mouth daily.  Marland Kitchen dutasteride (AVODART)  0.5 MG capsule Take 0.5 mg by mouth daily.   Marland Kitchen levothyroxine (SYNTHROID, LEVOTHROID) 50 MCG tablet Take 50 mcg by mouth daily before breakfast.  . nitroGLYCERIN (NITROSTAT) 0.4 MG SL tablet Place 1 tablet under the tongue as needed.  . pravastatin (PRAVACHOL) 20 MG tablet Take 1 tablet by mouth at bedtime.  . triamterene-hydrochlorothiazide (DYAZIDE) 37.5-25 MG capsule Take 1 capsule by mouth daily.   No facility-administered encounter medications on file as of 07/06/2019.    Review of Systems:  Review of Systems  Constitutional: Negative.   HENT: Negative.   Respiratory: Positive for chest tightness.   Cardiovascular: Negative.   Gastrointestinal: Positive for diarrhea.  Genitourinary: Negative.   Musculoskeletal: Negative.   Skin: Negative.   Neurological: Negative.   Psychiatric/Behavioral: Negative.     Health Maintenance  Topic Date Due  . TETANUS/TDAP  07/15/2025  . INFLUENZA VACCINE  Discontinued  . PNA vac Low Risk Adult  Discontinued    Physical Exam: Vitals:   07/06/19 1310  BP: 116/76  Pulse: 77  Temp: (!) 96.4 F (35.8 C)  SpO2: 97%  Weight: 133 lb 6.4 oz (60.5 kg)  Height: 6\' 1"  (1.854 m)   Body mass index is 17.6 kg/m. Physical Exam  Constitutional: Oriented to person, place, and time. Well-developed and well-nourished.  HENT:  Head: Normocephalic.  Mouth/Throat: Oropharynx is clear and moist.  Eyes: Pupils are equal, round, and reactive to light.  Neck: Neck supple.  Cardiovascular: Normal rate and normal heart sounds.  No murmur heard. Pulmonary/Chest: Effort normal and breath sounds normal. No respiratory distress. No wheezes. She has no rales.  Abdominal: Soft. Bowel sounds are normal. No distension. There is no tenderness. There is no rebound.  Musculoskeletal: Has Chronic Venous Changes Bilateral with Mild edema Lymphadenopathy: none Neurological: Alert and oriented to person, place, and time.  Skin: Skin is warm and dry.  Psychiatric:  Normal mood and affect. Behavior is normal. Thought content normal.    Labs reviewed: Basic Metabolic Panel: Recent Labs    12/25/18 0000 12/26/18 0720  NA  --  139  K  --  4.2  CL  --  100  CO2  --  32  GLUCOSE CANCELED 94  BUN  --  32*  CREATININE  --  1.11  CALCIUM  --  9.3  TSH CANCELED 3.78   Liver Function Tests: Recent Labs    12/26/18 0720  AST 13  ALT 9  BILITOT 0.8  PROT 6.4   No results for input(s): LIPASE, AMYLASE in the last 8760 hours. No results for input(s): AMMONIA in the last 8760 hours. CBC: Recent Labs    12/25/18 0000 12/26/18 0720  WBC CANCELED 4.7  NEUTROABS  --  2,176  HGB  --  14.6  HCT  --  43.8  MCV  --  93.2  PLT  --  226   Lipid Panel: Recent Labs    12/25/18 0000 12/26/18 0720  CHOL  --  132  HDL  --  46  LDLCALC CANCELED 72  TRIG  --  63  CHOLHDL  --  2.9   No results found for: HGBA1C  Procedures since last visit: No results found.  Assessment/Plan Essential hypertension Controlled on Dyazide  Paroxysmal atrial fibrillation (HCC) Rate Control on Digoxin Level was normal before Has refused Anticoagulation  Acquired hypothyroidism Repeat TSH Benign prostatic hyperplasia without lower urinary tract symptoms Stable on Dutasteride Hyperlipidemia LDL goal <70 On Statin PVD (peripheral vascular disease) (HCC) Stable on Plavix and statin History of CVA Doing well on Plavix and statin  Atypical chest pain Discussed in Detail with patient We can send him to Cardiology as he is c/o Pain getting worse He can go to ED when he does not respond to nitro. Told him to write down the time that he has chest pains and how many nitroglycerin he needed.  I also offered to start him on a long-acting Imdur. He reevaluated and let me know on next visit     Labs/tests ordered:  * No order type specified * Next appt:  Visit date not found  Total time spent in this patient care encounter was 45 _  minutes; greater than  50% of the visit spent counseling patient and staff, reviewing records , Labs and coordinating care for problems addressed at this encounter.

## 2019-07-10 DIAGNOSIS — I10 Essential (primary) hypertension: Secondary | ICD-10-CM | POA: Diagnosis not present

## 2019-07-10 DIAGNOSIS — E785 Hyperlipidemia, unspecified: Secondary | ICD-10-CM | POA: Diagnosis not present

## 2019-07-11 ENCOUNTER — Encounter: Payer: Self-pay | Admitting: Internal Medicine

## 2019-07-11 LAB — COMPLETE METABOLIC PANEL WITH GFR
AG Ratio: 1.6 (calc) (ref 1.0–2.5)
ALT: 14 U/L (ref 9–46)
AST: 16 U/L (ref 10–35)
Albumin: 3.9 g/dL (ref 3.6–5.1)
Alkaline phosphatase (APISO): 52 U/L (ref 35–144)
BUN/Creatinine Ratio: 36 (calc) — ABNORMAL HIGH (ref 6–22)
BUN: 42 mg/dL — ABNORMAL HIGH (ref 7–25)
CO2: 32 mmol/L (ref 20–32)
Calcium: 9.4 mg/dL (ref 8.6–10.3)
Chloride: 101 mmol/L (ref 98–110)
Creat: 1.17 mg/dL — ABNORMAL HIGH (ref 0.70–1.11)
GFR, Est African American: 62 mL/min/{1.73_m2} (ref >=60)
GFR, Est Non African American: 54 mL/min/{1.73_m2} — ABNORMAL LOW (ref >=60)
Globulin: 2.5 g/dL (calc) (ref 1.9–3.7)
Glucose, Bld: 84 mg/dL (ref 65–99)
Potassium: 4.2 mmol/L (ref 3.5–5.3)
Sodium: 140 mmol/L (ref 135–146)
Total Bilirubin: 0.8 mg/dL (ref 0.2–1.2)
Total Protein: 6.4 g/dL (ref 6.1–8.1)

## 2019-07-11 LAB — LIPID PANEL
Cholesterol: 136 mg/dL (ref <200)
HDL: 52 mg/dL (ref >=40)
LDL Cholesterol (Calc): 71 mg/dL (calc)
Non-HDL Cholesterol (Calc): 84 mg/dL (calc) (ref <130)
Total CHOL/HDL Ratio: 2.6 (calc) (ref <5.0)
Triglycerides: 51 mg/dL (ref <150)

## 2019-07-11 LAB — CBC WITH DIFFERENTIAL/PLATELET
Absolute Monocytes: 374 cells/uL (ref 200–950)
Basophils Absolute: 62 cells/uL (ref 0–200)
Basophils Relative: 1.2 %
Eosinophils Absolute: 364 cells/uL (ref 15–500)
Eosinophils Relative: 7 %
HCT: 42 % (ref 38.5–50.0)
Hemoglobin: 14.1 g/dL (ref 13.2–17.1)
Lymphs Abs: 1492 cells/uL (ref 850–3900)
MCH: 31.1 pg (ref 27.0–33.0)
MCHC: 33.6 g/dL (ref 32.0–36.0)
MCV: 92.7 fL (ref 80.0–100.0)
MPV: 10.9 fL (ref 7.5–12.5)
Monocytes Relative: 7.2 %
Neutro Abs: 2907 cells/uL (ref 1500–7800)
Neutrophils Relative %: 55.9 %
Platelets: 211 10*3/uL (ref 140–400)
RBC: 4.53 10*6/uL (ref 4.20–5.80)
RDW: 12.7 % (ref 11.0–15.0)
Total Lymphocyte: 28.7 %
WBC: 5.2 10*3/uL (ref 3.8–10.8)

## 2019-07-11 LAB — TSH: TSH: 3.39 mIU/L (ref 0.40–4.50)

## 2019-07-12 ENCOUNTER — Other Ambulatory Visit: Payer: Self-pay

## 2019-07-12 ENCOUNTER — Non-Acute Institutional Stay: Payer: Medicare Other | Admitting: Nurse Practitioner

## 2019-07-12 ENCOUNTER — Encounter: Payer: Self-pay | Admitting: Nurse Practitioner

## 2019-07-12 DIAGNOSIS — I10 Essential (primary) hypertension: Secondary | ICD-10-CM | POA: Diagnosis not present

## 2019-07-12 DIAGNOSIS — I480 Paroxysmal atrial fibrillation: Secondary | ICD-10-CM

## 2019-07-12 DIAGNOSIS — N1831 Chronic kidney disease, stage 3a: Secondary | ICD-10-CM

## 2019-07-12 DIAGNOSIS — I739 Peripheral vascular disease, unspecified: Secondary | ICD-10-CM

## 2019-07-12 NOTE — Patient Instructions (Addendum)
Dc Dyazide, monitor blood pressure, weight, swelling in leg, cough, SOB at home, call for changes.

## 2019-07-12 NOTE — Progress Notes (Signed)
Location:      Place of Service:  Clinic (12) Provider: Marlana Latus NP  Code Status: DNR Goals of Care:  Advanced Directives 12/06/2018  Does Patient Have a Medical Advance Directive? Yes  Type of Paramedic of [place];Living will;Out of facility DNR (pink MOST or yellow form)  Does patient want to make changes to medical advance directive? No - Patient declined  Copy of [place] in Chart? -  Pre-existing out of facility DNR order (yellow form or pink MOST form) -     Chief Complaint  Patient presents with  . Medical Management of Chronic Issues    Follow up on renal function.    HPI: Patient is a 86 y.o. male seen today for an acute visit for slightly elevated Bun/creat 42/1.27 from previous 32/1.11 12/26/18. The patient has Hx of PVD/HTN, no apparent fluid retention, on Dyazide. AFib, heart rate is in control, on Digoxin 0.140m qd.   Past Medical History:  Diagnosis Date  . Anginal pain ([facility]    nuclear stress 03/12/13 EPIC  . Balance problem 07/17/2015  . Basal cell carcinoma    left forearm  . BPH (benign prostatic hyperplasia)   . Cataract, nuclear 08/08/2014  . 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 Cervical spondylosis without myelopathy 08/22/2015  . Diverticulosis   . Diverticulosis of colon without hemorrhage 08/22/2015  . Edema 08/18/2015  . Fall 07/17/2015  . Gait disturbance 08/22/2015  . GERD (gastroesophageal reflux disease)    occurs rarely  . History of CVA (cerebrovascular accident) 07/17/2015  . HLD (hyperlipidemia) 07/17/2015  . Hyperlipidemia   . Hypertension   . Inguinal hernia   . Left carotid bruit   . Melanoma ([facility]    left forearm  . Osteopenia   . Paroxysmal atrial fibrillation (HCC)   . Physical deconditioning 08/22/2015  . Retinal hemorrhage 04/24/2014  . Rosacea blepharoconjunctivitis 11/20/2011  . Stroke ([facility] 3/07   affected balance  . Tubular adenoma of colon 2007  . Weakness 07/17/2015     Past Surgical History:  Procedure Laterality Date  . COLONOSCOPY  2008  . EYE SURGERY Right    cataract extraction with IOL  . INGUINAL HERNIA REPAIR Right 03/15/2013   Procedure: HERNIA REPAIR INGUINAL ADULT;  Surgeon: TOdis Hollingshead MD;  Location: [facility];  Service: General;  Laterality: Right;  . INSERTION OF MESH Right 03/15/2013   Procedure: INSERTION OF MESH;  Surgeon: TOdis Hollingshead MD;  Location: [facility];  Service: General;  Laterality: Right;  . left forearm melanoma    . PROSTATE BIOPSY    . TONSILLECTOMY  1933    Allergies  Allergen Reactions  . Aspirin   . Eggs Or Egg-Derived Products Swelling    throat    Allergies as of 07/12/2019      Reactions   Aspirin    Eggs Or Egg-derived Products Swelling   throat      Medication List       Accurate as of July 12, 2019 11:59 PM. If you have any questions, ask your nurse or doctor.        cholecalciferol 10 MCG (400 UNIT) Tabs tablet Commonly known as: VITAMIN D3 Take 400 Units by mouth 2 (two) times daily.   clopidogrel 75 MG tablet Commonly known as: PLAVIX Take 75 mg by mouth daily. Start PLAVIX from 07/24/15 if no further hematuria is reported by patient and HB > 12.   dutasteride 0.5 MG capsule Commonly  known as: AVODART Take 0.5 mg by mouth daily.   Lanoxin 0.125 MG tablet Generic drug: digoxin Take 0.125 mg by mouth daily.   levothyroxine 50 MCG tablet Commonly known as: SYNTHROID Take 50 mcg by mouth daily before breakfast.   nitroGLYCERIN 0.4 MG SL tablet Commonly known as: NITROSTAT Place 1 tablet under the tongue as needed.   pravastatin 20 MG tablet Commonly known as: PRAVACHOL Take 1 tablet by mouth at bedtime.   triamterene-hydrochlorothiazide 37.5-25 MG capsule Commonly known as: DYAZIDE Take 1 capsule by mouth daily.       Review of Systems:  Review of Systems  Constitutional: Negative for activity change, appetite change, chills, diaphoresis, fatigue and fever.   HENT: Positive for hearing loss. Negative for congestion and voice change.   Eyes: Negative for visual disturbance.  Respiratory: Negative for cough, shortness of breath and wheezing.   Cardiovascular: Negative for chest pain, palpitations and leg swelling.  Gastrointestinal: Negative for abdominal distention, abdominal pain, constipation, diarrhea, nausea and vomiting.  Genitourinary: Negative for difficulty urinating, dysuria and urgency.  Musculoskeletal: Positive for gait problem.  Skin: Negative for color change and pallor.  Neurological: Negative for dizziness, speech difficulty, weakness and headaches.  Psychiatric/Behavioral: Negative for agitation, behavioral problems, hallucinations and sleep disturbance. The patient is not nervous/anxious.     Health Maintenance  Topic Date Due  . TETANUS/TDAP  07/15/2025  . INFLUENZA VACCINE  Discontinued  . PNA vac Low Risk Adult  Discontinued    Physical Exam: Vitals:   07/12/19 1433  BP: 126/72  Pulse: 75  Temp: (!) 95.9 F (35.5 C)  SpO2: 97%  Weight: 132 lb (59.9 kg)  Height: '6\' 1"'  (1.854 m)   Body mass index is 17.42 kg/m. Physical Exam Vitals and nursing note reviewed.  Constitutional:      General: He is not in acute distress.    Appearance: Normal appearance. He is not ill-appearing, toxic-appearing or diaphoretic.  HENT:     Head: Normocephalic and atraumatic.     Nose: Nose normal.     Mouth/Throat:     Mouth: Mucous membranes are moist.  Eyes:     Extraocular Movements: Extraocular movements intact.     Conjunctiva/sclera: Conjunctivae normal.     Pupils: Pupils are equal, round, and reactive to light.  Cardiovascular:     Rate and Rhythm: Normal rate. Rhythm irregular.     Heart sounds: No murmur.  Pulmonary:     Breath sounds: No wheezing, rhonchi or rales.  Abdominal:     General: Bowel sounds are normal. There is no distension.     Palpations: Abdomen is soft.     Tenderness: There is no abdominal  tenderness. There is no right CVA tenderness, left CVA tenderness, guarding or rebound.  Musculoskeletal:     Cervical back: Normal range of motion and neck supple.     Right lower leg: No edema.     Left lower leg: No edema.  Skin:    General: Skin is warm and dry.     Comments: Chronic venous insufficiency skin changes.   Neurological:     General: No focal deficit present.     Mental Status: He is alert and oriented to person, place, and time. Mental status is at baseline.  Psychiatric:        Mood and Affect: Mood normal.        Behavior: Behavior normal.        Thought Content: Thought content normal.  Judgment: Judgment normal.     Labs reviewed: Basic Metabolic Panel: Recent Labs    12/25/18 0000 12/26/18 0720 07/10/19 0740  NA  --  139 140  K  --  4.2 4.2  CL  --  100 101  CO2  --  32 32  GLUCOSE CANCELED 94 84  BUN  --  32* 42*  CREATININE  --  1.11 1.17*  CALCIUM  --  9.3 9.4  TSH CANCELED 3.78 3.39   Liver Function Tests: Recent Labs    12/26/18 0720 07/10/19 0740  AST 13 16  ALT 9 14  BILITOT 0.8 0.8  PROT 6.4 6.4   No results for input(s): LIPASE, AMYLASE in the last 8760 hours. No results for input(s): AMMONIA in the last 8760 hours. CBC: Recent Labs    12/25/18 0000 12/26/18 0720 07/10/19 0740  WBC CANCELED 4.7 5.2  NEUTROABS  --  2,176 2,907  HGB  --  14.6 14.1  HCT  --  43.8 42.0  MCV  --  93.2 92.7  PLT  --  226 211   Lipid Panel: Recent Labs    12/25/18 0000 12/26/18 0720 07/10/19 0740  CHOL  --  132 136  HDL  --  46 52  LDLCALC CANCELED 72 71  TRIG  --  63 51  CHOLHDL  --  2.9 2.6   No results found for: HGBA1C  Procedures since last visit: No results found.  Assessment/Plan CKD (chronic kidney disease) stage 3, GFR 30-59 ml/min 07/10/19 cholesterol 136, HDL 52, triglycerides 51, LDL 71, TSH 3.39, wbc 5.2, Hgb 14.1, plt 211, neutrophils 55.9, Na 140, K 4.2, Bun 42, creat 1.17, eGFR 54-dc Dyazide. F/u 4 wks.  Monitor weight, SOB, cough, or swelling in leg.   Paroxysmal atrial fibrillation (HCC) Heart rate is in control, continue Digoxin.   PVD (peripheral vascular disease) (HCC) No apparent edema BLE, dc Dyazide.   HTN (hypertension) Blood pressure is controlled, dc Dyazide in setting of trending up Bun/creat. Monitor Bp.     Labs/tests ordered:  None  Next appt:  08/17/2019

## 2019-07-12 NOTE — Assessment & Plan Note (Signed)
No apparent edema BLE, dc Dyazide.

## 2019-07-12 NOTE — Assessment & Plan Note (Signed)
Heart rate is in control, continue Digoxin.

## 2019-07-12 NOTE — Assessment & Plan Note (Signed)
Blood pressure is controlled, dc Dyazide in setting of trending up Bun/creat. Monitor Bp.

## 2019-07-12 NOTE — Assessment & Plan Note (Signed)
07/10/19 cholesterol 136, HDL 52, triglycerides 51, LDL 71, TSH 3.39, wbc 5.2, Hgb 14.1, plt 211, neutrophils 55.9, Na 140, K 4.2, Bun 42, creat 1.17, eGFR 54-dc Dyazide. F/u 4 wks. Monitor weight, SOB, cough, or swelling in leg.

## 2019-07-30 DIAGNOSIS — Z23 Encounter for immunization: Secondary | ICD-10-CM | POA: Diagnosis not present

## 2019-08-02 ENCOUNTER — Encounter: Payer: BLUE CROSS/BLUE SHIELD | Admitting: Nurse Practitioner

## 2019-08-03 ENCOUNTER — Other Ambulatory Visit: Payer: Self-pay

## 2019-08-03 ENCOUNTER — Encounter: Payer: Self-pay | Admitting: Internal Medicine

## 2019-08-03 ENCOUNTER — Non-Acute Institutional Stay: Payer: Medicare Other | Admitting: Internal Medicine

## 2019-08-03 ENCOUNTER — Encounter: Payer: BLUE CROSS/BLUE SHIELD | Admitting: Internal Medicine

## 2019-08-03 VITALS — BP 118/80 | HR 66 | Temp 96.9°F | Ht 73.0 in | Wt 132.4 lb

## 2019-08-03 DIAGNOSIS — I480 Paroxysmal atrial fibrillation: Secondary | ICD-10-CM

## 2019-08-03 DIAGNOSIS — R0789 Other chest pain: Secondary | ICD-10-CM

## 2019-08-03 DIAGNOSIS — I10 Essential (primary) hypertension: Secondary | ICD-10-CM | POA: Diagnosis not present

## 2019-08-03 DIAGNOSIS — L89321 Pressure ulcer of left buttock, stage 1: Secondary | ICD-10-CM | POA: Diagnosis not present

## 2019-08-03 NOTE — Progress Notes (Signed)
Location: [place] of Service:  Clinic (12)  Provider:   Code Status:  Goals of Care:  Advanced Directives 12/06/2018  Does Patient Have a Medical Advance Directive? Yes  Type of Paramedic of [place];Living will;Out of facility DNR (pink MOST or yellow form)  Does patient want to make changes to medical advance directive? No - Patient declined  Copy of [place] in Chart? -  Pre-existing out of facility DNR order (yellow form or pink MOST form) -     Chief Complaint  Patient presents with  . Acute Visit    Boill on left side buttock    HPI: Patient is a 86 y.o. male seen today for an acute visit for ? Boil in his Left Buttocks.  Patient has notice some swelling in his Left Buttock and think if it is Boil.He says it hurts when he sits on it. Patient is wheelchair dependent and sits on his Bed or wheelchair most of the time  Also he was told to stop Dyazide on last visit but he never did and still taking it. Atypical chest pain  Patient has a history of atypical chest pain.  He states he has never seen a cardiologist before. But he did have Stress test done in 2014 which Showed fixed Inferior Defect but no ischemia He has been taking sublingual nitroglycerin for many years.  He  usually needs it every 3 to 4 months.  At this time he says it is stable and not interested in further work up   Past Medical History:  Diagnosis Date  . Anginal pain ([place])    nuclear stress 03/12/13 EPIC  . Balance problem 07/17/2015  . Basal cell carcinoma    left forearm  . BPH (benign prostatic hyperplasia)   . Cataract, nuclear 08/08/2014  . 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 Cervical spondylosis without myelopathy 08/22/2015  . Diverticulosis   . Diverticulosis of colon without hemorrhage 08/22/2015  . Edema 08/18/2015  . Fall 07/17/2015  . Gait disturbance 08/22/2015  . GERD (gastroesophageal reflux disease)    occurs  rarely  . History of CVA (cerebrovascular accident) 07/17/2015  . HLD (hyperlipidemia) 07/17/2015  . Hyperlipidemia   . Hypertension   . Inguinal hernia   . Left carotid bruit   . Melanoma ([place])    left forearm  . Osteopenia   . Paroxysmal atrial fibrillation (HCC)   . Physical deconditioning 08/22/2015  . Retinal hemorrhage 04/24/2014  . Rosacea blepharoconjunctivitis 11/20/2011  . Stroke ([place]) 3/07   affected balance  . Tubular adenoma of colon 2007  . Weakness 07/17/2015    Past Surgical History:  Procedure Laterality Date  . COLONOSCOPY  2008  . EYE SURGERY Right    cataract extraction with IOL  . INGUINAL HERNIA REPAIR Right 03/15/2013   Procedure: HERNIA REPAIR INGUINAL ADULT;  Surgeon: Odis Hollingshead, MD;  Location: [facility];  Service: General;  Laterality: Right;  . INSERTION OF MESH Right 03/15/2013   Procedure: INSERTION OF MESH;  Surgeon: Odis Hollingshead, MD;  Location: [facility];  Service: General;  Laterality: Right;  . left forearm melanoma    . PROSTATE BIOPSY    . TONSILLECTOMY  1933    Allergies  Allergen Reactions  . Aspirin   . Eggs Or Egg-Derived Products Swelling    throat    Outpatient Encounter Medications as of 08/03/2019  Medication Sig  . cholecalciferol (VITAMIN D) 400 units TABS  tablet Take 400 Units by mouth 2 (two) times daily.  . clopidogrel (PLAVIX) 75 MG tablet Take 75 mg by mouth daily. Start PLAVIX from 07/24/15 if no further hematuria is reported by patient and HB > 12.  . digoxin (LANOXIN) 0.125 MG tablet Take 0.125 mg by mouth daily.  Marland Kitchen dutasteride (AVODART) 0.5 MG capsule Take 0.5 mg by mouth daily.   Marland Kitchen levothyroxine (SYNTHROID, LEVOTHROID) 50 MCG tablet Take 50 mcg by mouth daily before breakfast.  . nitroGLYCERIN (NITROSTAT) 0.4 MG SL tablet Place 1 tablet under the tongue as needed.  . pravastatin (PRAVACHOL) 20 MG tablet Take 1 tablet by mouth at bedtime.  . triamterene-hydrochlorothiazide (DYAZIDE) 37.5-25 MG capsule Take 1 capsule  by mouth daily.   No facility-administered encounter medications on file as of 08/03/2019.    Review of Systems:  Review of Systems  Constitutional: Positive for activity change.  HENT: Negative.   Respiratory: Negative.   Cardiovascular: Positive for leg swelling.  Gastrointestinal: Negative.   Genitourinary: Negative.   Musculoskeletal: Positive for gait problem.  Skin: Positive for color change.  Neurological: Positive for weakness.  Psychiatric/Behavioral: Negative.     Health Maintenance  Topic Date Due  . TETANUS/TDAP  07/15/2025  . INFLUENZA VACCINE  Discontinued  . PNA vac Low Risk Adult  Discontinued    Physical Exam: Vitals:   08/03/19 1105  BP: 118/80  Pulse: 66  Temp: (!) 96.9 F (36.1 C)  SpO2: 97%  Weight: 132 lb 6.4 oz (60.1 kg)  Height: 6\' 1"  (1.854 m)   Body mass index is 17.47 kg/m. Physical Exam Vitals reviewed.  Constitutional:      Appearance: Normal appearance.  HENT:     Head: Normocephalic.     Nose: Nose normal.     Mouth/Throat:     Mouth: Mucous membranes are moist.     Pharynx: Oropharynx is clear.  Eyes:     Pupils: Pupils are equal, round, and reactive to light.  Cardiovascular:     Rate and Rhythm: Normal rate.     Pulses: Normal pulses.  Pulmonary:     Effort: Pulmonary effort is normal.     Breath sounds: Normal breath sounds.  Abdominal:     General: Abdomen is flat. Bowel sounds are normal.     Palpations: Abdomen is soft.  Musculoskeletal:        General: Swelling present.     Cervical back: Neck supple.  Skin:    Comments: Has Non Blanchable Stage 1 pressure wound. With Hardened skin in the center  Neurological:     General: No focal deficit present.     Mental Status: He is alert.     Labs reviewed: Basic Metabolic Panel: Recent Labs    12/25/18 0000 12/26/18 0720 07/10/19 0740  NA  --  139 140  K  --  4.2 4.2  CL  --  100 101  CO2  --  32 32  GLUCOSE CANCELED 94 84  BUN  --  32* 42*  CREATININE   --  1.11 1.17*  CALCIUM  --  9.3 9.4  TSH CANCELED 3.78 3.39   Liver Function Tests: Recent Labs    12/26/18 0720 07/10/19 0740  AST 13 16  ALT 9 14  BILITOT 0.8 0.8  PROT 6.4 6.4   No results for input(s): LIPASE, AMYLASE in the last 8760 hours. No results for input(s): AMMONIA in the last 8760 hours. CBC: Recent Labs    12/25/18 0000 12/26/18  0720 07/10/19 0740  WBC CANCELED 4.7 5.2  NEUTROABS  --  2,176 2,907  HGB  --  14.6 14.1  HCT  --  43.8 42.0  MCV  --  93.2 92.7  PLT  --  226 211   Lipid Panel: Recent Labs    12/25/18 0000 12/26/18 0720 07/10/19 0740  CHOL  --  132 136  HDL  --  46 52  LDLCALC CANCELED 72 71  TRIG  --  63 51  CHOLHDL  --  2.9 2.6   No results found for: HGBA1C  Procedures since last visit: No results found.  Assessment/Plan  Pressure injury of left buttock, stage 1 Will Apply Zinc Oxide/[place] BID  Use Donut Pillow to sit. Try not to sit on pressure area for long time. Will also need to change Cushion in his Wheelchair  Essential hypertension Discontinue Dyazide Will follow with me in 2 weeks for his BP  Paroxysmal atrial fibrillation (HCC) Rate Control on Digoxin Level was normal before Has refused Anticoagulation Atypical chest pain Have Discussed in Detail with patient We can send him to Cardiology as he is c/o Pain getting worse He can go to ED when he does not respond to nitro. .  I also offered to start him on a long-acting Imdur. Is refusing further Work up right now Will let me know if pain gets worse    Labs/tests ordered:  * No order type specified * Next appt:  08/17/2019

## 2019-08-17 ENCOUNTER — Non-Acute Institutional Stay: Payer: Medicare Other | Admitting: Internal Medicine

## 2019-08-17 ENCOUNTER — Encounter: Payer: Self-pay | Admitting: Internal Medicine

## 2019-08-17 ENCOUNTER — Other Ambulatory Visit: Payer: Self-pay

## 2019-08-17 VITALS — BP 110/60 | HR 64 | Temp 96.0°F | Ht 73.0 in | Wt 130.2 lb

## 2019-08-17 DIAGNOSIS — N400 Enlarged prostate without lower urinary tract symptoms: Secondary | ICD-10-CM

## 2019-08-17 DIAGNOSIS — L89321 Pressure ulcer of left buttock, stage 1: Secondary | ICD-10-CM | POA: Diagnosis not present

## 2019-08-17 DIAGNOSIS — R0789 Other chest pain: Secondary | ICD-10-CM

## 2019-08-17 DIAGNOSIS — N1831 Chronic kidney disease, stage 3a: Secondary | ICD-10-CM

## 2019-08-17 DIAGNOSIS — R4189 Other symptoms and signs involving cognitive functions and awareness: Secondary | ICD-10-CM | POA: Diagnosis not present

## 2019-08-17 DIAGNOSIS — E785 Hyperlipidemia, unspecified: Secondary | ICD-10-CM

## 2019-08-17 DIAGNOSIS — I10 Essential (primary) hypertension: Secondary | ICD-10-CM | POA: Diagnosis not present

## 2019-08-17 DIAGNOSIS — E039 Hypothyroidism, unspecified: Secondary | ICD-10-CM | POA: Diagnosis not present

## 2019-08-17 DIAGNOSIS — I480 Paroxysmal atrial fibrillation: Secondary | ICD-10-CM

## 2019-08-17 DIAGNOSIS — R1312 Dysphagia, oropharyngeal phase: Secondary | ICD-10-CM

## 2019-08-17 NOTE — Progress Notes (Signed)
Location: [place] of Service:  Clinic (12)  Provider:   Code Status:  Goals of Care:  Advanced Directives 12/06/2018  Does Patient Have a Medical Advance Directive? Yes  Type of Paramedic of [place];Living will;Out of facility DNR (pink MOST or yellow form)  Does patient want to make changes to medical advance directive? No - Patient declined  Copy of [place] in Chart? -  Pre-existing out of facility DNR order (yellow form or pink MOST form) -     Chief Complaint  Patient presents with  . Medical Management of Chronic Issues    Follow Up. No concerns     HPI: Patient is a 86 y.o. male seen today for an acute visit for BP management  He has h/o PAF, Hypertension, Hyperlipidemia, BPH, H/o CVA  Active problems Cough While Eating. Says sometimes he coughs when he is eating.  Wants to know if he can do something to avoid  Atypical Chest Pain Takes Nitro but says it does not really help  He states he has never seen a cardiologist before. But he did have Stress test done in 2014 which Showed fixed Inferior Defect but no ischemia He has been taking sublingual nitroglycerin for many years.  He  usually needs it every 3 to 4 months.  But recently  has been using it every month.  His pain is very atypical mostly on the left side.  He says mostly discomfort.  Not related to exertion.  No diaphoresis. No SOB Has not woken him up from sleep.  He states he usually takes 3 nitroglycerin to help his pain/discomfort Does not want to see Cardiologist Stage 1 Pressure Ulcer in Sacral Area Told to use Donut Pillow and Desitin Cream. Not doing it Hypertension We had told him to stop Dyazide as his BUN was elevated but patient is still on it. Is stable with no Dizziness   Patient lives by himself. Has not had any falls. Independent his ADLS Has 2 Children who live out of [place].  Past Medical History:  Diagnosis Date  .  Anginal pain ([place])    nuclear stress 03/12/13 EPIC  . Balance problem 07/17/2015  . Basal cell carcinoma    left forearm  . BPH (benign prostatic hyperplasia)   . Cataract, nuclear 08/08/2014  . 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 Cervical spondylosis without myelopathy 08/22/2015  . Diverticulosis   . Diverticulosis of colon without hemorrhage 08/22/2015  . Edema 08/18/2015  . Fall 07/17/2015  . Gait disturbance 08/22/2015  . GERD (gastroesophageal reflux disease)    occurs rarely  . History of CVA (cerebrovascular accident) 07/17/2015  . HLD (hyperlipidemia) 07/17/2015  . Hyperlipidemia   . Hypertension   . Inguinal hernia   . Left carotid bruit   . Melanoma ([place])    left forearm  . Osteopenia   . Paroxysmal atrial fibrillation (HCC)   . Physical deconditioning 08/22/2015  . Retinal hemorrhage 04/24/2014  . Rosacea blepharoconjunctivitis 11/20/2011  . Stroke ([place]) 3/07   affected balance  . Tubular adenoma of colon 2007  . Weakness 07/17/2015    Past Surgical History:  Procedure Laterality Date  . COLONOSCOPY  2008  . EYE SURGERY Right    cataract extraction with IOL  . INGUINAL HERNIA REPAIR Right 03/15/2013   Procedure: HERNIA REPAIR INGUINAL ADULT;  Surgeon: Odis Hollingshead, MD;  Location: [facility];  Service: General;  Laterality: Right;  . INSERTION OF  MESH Right 03/15/2013   Procedure: INSERTION OF MESH;  Surgeon: Odis Hollingshead, MD;  Location: [facility];  Service: General;  Laterality: Right;  . left forearm melanoma    . PROSTATE BIOPSY    . TONSILLECTOMY  1933    Allergies  Allergen Reactions  . Aspirin   . Eggs Or Egg-Derived Products Swelling    throat    Outpatient Encounter Medications as of 08/17/2019  Medication Sig  . cholecalciferol (VITAMIN D) 400 units TABS tablet Take 400 Units by mouth 2 (two) times daily.  . clopidogrel (PLAVIX) 75 MG tablet Take 75 mg by mouth daily. Start PLAVIX from 07/24/15 if no further hematuria is reported by patient and HB >  12.  . digoxin (LANOXIN) 0.125 MG tablet Take 0.125 mg by mouth daily.  Marland Kitchen dutasteride (AVODART) 0.5 MG capsule Take 0.5 mg by mouth daily.   Marland Kitchen levothyroxine (SYNTHROID, LEVOTHROID) 50 MCG tablet Take 50 mcg by mouth daily before breakfast.  . nitroGLYCERIN (NITROSTAT) 0.4 MG SL tablet Place 1 tablet under the tongue as needed.  . pravastatin (PRAVACHOL) 20 MG tablet Take 1 tablet by mouth at bedtime.  . triamterene-hydrochlorothiazide (DYAZIDE) 37.5-25 MG capsule Take 1 capsule by mouth daily.   No facility-administered encounter medications on file as of 08/17/2019.    Review of Systems:  Review of Systems  Constitutional: Negative.   HENT: Negative.   Respiratory: Positive for cough.   Cardiovascular: Positive for leg swelling.  Gastrointestinal: Negative.   Genitourinary: Negative.   Musculoskeletal: Negative.   Skin: Positive for rash.  Neurological: Negative.   Psychiatric/Behavioral: Negative.     Health Maintenance  Topic Date Due  . TETANUS/TDAP  07/15/2025  . INFLUENZA VACCINE  Discontinued  . PNA vac Low Risk Adult  Discontinued    Physical Exam: Vitals:   08/17/19 1013  BP: 110/60  Pulse: 64  Temp: (!) 96 F (35.6 C)  SpO2: 95%  Weight: 130 lb 3.2 oz (59.1 kg)  Height: 6\' 1"  (1.854 m)   Body mass index is 17.18 kg/m. Physical Exam  Constitutional:  Well-developed and well-nourished.  HENT:  Head: Normocephalic.  Mouth/Throat: Oropharynx is clear and moist.  Eyes: Pupils are equal, round, and reactive to light.  Neck: Neck supple.  Cardiovascular: Normal rate and normal heart sounds.  No murmur heard. Pulmonary/Chest: Effort normal and breath sounds normal. No respiratory distress. No wheezes. She has no rales.  Abdominal: Soft. Bowel sounds are normal. No distension. There is no tenderness. There is no rebound.  Musculoskeletal: Mild Edema Bilateral With Chronic Venous Changes Lymphadenopathy: none Neurological: Alert and oriented to person, place,  and time.  Wheelchair Dependent but can do his Transfers Skin: Skin is warm and dry.  Psychiatric: Normal mood and affect. Behavior is normal. Thought content normal.    Labs reviewed: Basic Metabolic Panel: Recent Labs    12/25/18 0000 12/26/18 0720 07/10/19 0740  NA  --  139 140  K  --  4.2 4.2  CL  --  100 101  CO2  --  32 32  GLUCOSE CANCELED 94 84  BUN  --  32* 42*  CREATININE  --  1.11 1.17*  CALCIUM  --  9.3 9.4  TSH CANCELED 3.78 3.39   Liver Function Tests: Recent Labs    12/26/18 0720 07/10/19 0740  AST 13 16  ALT 9 14  BILITOT 0.8 0.8  PROT 6.4 6.4   No results for input(s): LIPASE, AMYLASE in the last 8760  hours. No results for input(s): AMMONIA in the last 8760 hours. CBC: Recent Labs    12/25/18 0000 12/26/18 0720 07/10/19 0740  WBC CANCELED 4.7 5.2  NEUTROABS  --  2,176 2,907  HGB  --  14.6 14.1  HCT  --  43.8 42.0  MCV  --  93.2 92.7  PLT  --  226 211   Lipid Panel: Recent Labs    12/25/18 0000 12/26/18 0720 07/10/19 0740  CHOL  --  132 136  HDL  --  46 52  LDLCALC CANCELED 72 71  TRIG  --  63 51  CHOLHDL  --  2.9 2.6   No results found for: HGBA1C  Procedures since last visit: No results found.  Assessment/Plan Pressure injury of left buttock, stage 1 He never used Desitin Cream He is going to try Vaseline PRN. Also Will Talk to get New Wheelchair or new cushion  Essential hypertension Continue Dyazide for now Will repeat BMP  Paroxysmal atrial fibrillation ([place]) On Digoxin for rate Control  Does take Plavix.  States he was told not to take any blood thinner But per Dr Bubba Camp note he refused to take Blood thinners  Atypical chest pain Refuses to see Cardiology I offered him to try Protonix for few weeks to see if it Will help  He does not want to try anything new Stage 3a chronic kidney disease Repeat BMP  Acquired hypothyroidism TSH Normal Benign prostatic hyperplasia without lower urinary tract symptoms On  Avodart Hyperlipidemia LDL goal <70 LDL 71 On Statin Oropharyngeal dysphagia This seems to be issues before Has seen Speech before. Talked again about Eating slowly and sitting upright  Does not want to see Speech right now Cognitive decline He says that is issue Will do MMSE next vist H/O CVA On Plavix and Statin      Labs/tests ordered:  * No order type specified * Next appt:  Visit date not found  Total time spent in this patient care encounter was  45_  minutes; greater than 50% of the visit spent counseling patient and staff, reviewing records , Labs and coordinating care for problems addressed at this encounter.

## 2019-11-14 DIAGNOSIS — I10 Essential (primary) hypertension: Secondary | ICD-10-CM | POA: Diagnosis not present

## 2019-11-15 ENCOUNTER — Other Ambulatory Visit: Payer: Self-pay

## 2019-11-15 DIAGNOSIS — I10 Essential (primary) hypertension: Secondary | ICD-10-CM

## 2019-11-15 LAB — COMPLETE METABOLIC PANEL WITH GFR
AG Ratio: 1.4 (calc) (ref 1.0–2.5)
ALT: 13 U/L (ref 9–46)
AST: 15 U/L (ref 10–35)
Albumin: 3.6 g/dL (ref 3.6–5.1)
Alkaline phosphatase (APISO): 58 U/L (ref 35–144)
BUN/Creatinine Ratio: 33 (calc) — ABNORMAL HIGH (ref 6–22)
BUN: 40 mg/dL — ABNORMAL HIGH (ref 7–25)
CO2: 34 mmol/L — ABNORMAL HIGH (ref 20–32)
Calcium: 9.5 mg/dL (ref 8.6–10.3)
Chloride: 101 mmol/L (ref 98–110)
Creat: 1.2 mg/dL — ABNORMAL HIGH (ref 0.70–1.11)
GFR, Est African American: 60 mL/min/{1.73_m2} (ref >=60)
GFR, Est Non African American: 52 mL/min/{1.73_m2} — ABNORMAL LOW (ref >=60)
Globulin: 2.5 g/dL (calc) (ref 1.9–3.7)
Glucose, Bld: 90 mg/dL (ref 65–99)
Potassium: 4.4 mmol/L (ref 3.5–5.3)
Sodium: 140 mmol/L (ref 135–146)
Total Bilirubin: 0.7 mg/dL (ref 0.2–1.2)
Total Protein: 6.1 g/dL (ref 6.1–8.1)

## 2019-11-15 LAB — CBC WITH DIFFERENTIAL/PLATELET
Absolute Monocytes: 387 cells/uL (ref 200–950)
Basophils Absolute: 81 cells/uL (ref 0–200)
Basophils Relative: 1.8 %
Eosinophils Absolute: 554 cells/uL — ABNORMAL HIGH (ref 15–500)
Eosinophils Relative: 12.3 %
HCT: 40.7 % (ref 38.5–50.0)
Hemoglobin: 13.4 g/dL (ref 13.2–17.1)
Lymphs Abs: 1409 cells/uL (ref 850–3900)
MCH: 31.5 pg (ref 27.0–33.0)
MCHC: 32.9 g/dL (ref 32.0–36.0)
MCV: 95.8 fL (ref 80.0–100.0)
MPV: 10.6 fL (ref 7.5–12.5)
Monocytes Relative: 8.6 %
Neutro Abs: 2070 cells/uL (ref 1500–7800)
Neutrophils Relative %: 46 %
Platelets: 176 10*3/uL (ref 140–400)
RBC: 4.25 10*6/uL (ref 4.20–5.80)
RDW: 12.2 % (ref 11.0–15.0)
Total Lymphocyte: 31.3 %
WBC: 4.5 10*3/uL (ref 3.8–10.8)

## 2019-11-22 ENCOUNTER — Non-Acute Institutional Stay: Payer: Medicare Other | Admitting: Nurse Practitioner

## 2019-11-22 ENCOUNTER — Encounter: Payer: Self-pay | Admitting: Nurse Practitioner

## 2019-11-22 ENCOUNTER — Other Ambulatory Visit: Payer: Self-pay

## 2019-11-22 DIAGNOSIS — I739 Peripheral vascular disease, unspecified: Secondary | ICD-10-CM | POA: Diagnosis not present

## 2019-11-22 DIAGNOSIS — E039 Hypothyroidism, unspecified: Secondary | ICD-10-CM

## 2019-11-22 DIAGNOSIS — N400 Enlarged prostate without lower urinary tract symptoms: Secondary | ICD-10-CM | POA: Diagnosis not present

## 2019-11-22 DIAGNOSIS — N1831 Chronic kidney disease, stage 3a: Secondary | ICD-10-CM

## 2019-11-22 DIAGNOSIS — I480 Paroxysmal atrial fibrillation: Secondary | ICD-10-CM

## 2019-11-22 DIAGNOSIS — R1312 Dysphagia, oropharyngeal phase: Secondary | ICD-10-CM | POA: Diagnosis not present

## 2019-11-22 NOTE — Progress Notes (Signed)
Location:   clinic [place]   Place of Service:  Clinic (12) Provider: Marlana Latus NP  Code Status: DNR Goals of Care: [place] Advanced Directives 12/06/2018  Does Patient Have a Medical Advance Directive? Yes  Type of Paramedic of [place];Living will;Out of facility DNR (pink MOST or yellow form)  Does patient want to make changes to medical advance directive? No - Patient declined  Copy of [place] in Chart? -  Pre-existing out of facility DNR order (yellow form or pink MOST form) -     Chief Complaint  Patient presents with  . Medical Management of Chronic Issues    Patient returns to the clinic to discuss his legs and feet swelling, cough when eating and weight.    HPI: Patient is a 86 y.o. male seen today for medical management of chronic diseases.    The patient has chronic edema BLE, off diuretics due to his inadequate oral fluid intake, his weights are stable, #130 07/2019 and today. The patient denied cough except with eating/drinking, SOB, sputum production, chest pain/pressure, or palpitation. Hx of Afib, heart rate is in 50s, on Digoxin.  Hypothyroidism, stable, on Levothyroxine 56mcg qd. Urinary frequency, stable, on Dutasteride 0.5mg  qd   Past Medical History:  Diagnosis Date  . Anginal pain ([place])    nuclear stress 03/12/13 EPIC  . Balance problem 07/17/2015  . Basal cell carcinoma    left forearm  . BPH (benign prostatic hyperplasia)   . Cataract, nuclear 08/08/2014  . 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 Cervical spondylosis without myelopathy 08/22/2015  . Diverticulosis   . Diverticulosis of colon without hemorrhage 08/22/2015  . Edema 08/18/2015  . Fall 07/17/2015  . Gait disturbance 08/22/2015  . GERD (gastroesophageal reflux disease)    occurs rarely  . History of CVA (cerebrovascular accident) 07/17/2015  . HLD (hyperlipidemia) 07/17/2015  . Hyperlipidemia   . Hypertension   . Inguinal hernia   . Left carotid bruit    . Melanoma ([place])    left forearm  . Osteopenia   . Paroxysmal atrial fibrillation (HCC)   . Physical deconditioning 08/22/2015  . Retinal hemorrhage 04/24/2014  . Rosacea blepharoconjunctivitis 11/20/2011  . Stroke ([place]) 3/07   affected balance  . Tubular adenoma of colon 2007  . Weakness 07/17/2015    Past Surgical History:  Procedure Laterality Date  . COLONOSCOPY  2008  . EYE SURGERY Right    cataract extraction with IOL  . INGUINAL HERNIA REPAIR Right 03/15/2013   Procedure: HERNIA REPAIR INGUINAL ADULT;  Surgeon: Odis Hollingshead, MD;  [facility];  Service: General;  Laterality: Right;  . INSERTION OF MESH Right 03/15/2013   Procedure: INSERTION OF MESH;  Surgeon: Odis Hollingshead, MD;  [facility]: [facility];  Service: General;  Laterality: Right;  . left forearm melanoma    . PROSTATE BIOPSY    . TONSILLECTOMY  1933    Allergies  Allergen Reactions  . Aspirin   . Eggs Or Egg-Derived Products Swelling    throat    Allergies as of 11/22/2019      Reactions   Aspirin    Eggs Or Egg-derived Products Swelling   throat      Medication List       Accurate as of Nov 22, 2019 11:59 PM. If you have any questions, ask your nurse or doctor.        cholecalciferol 10 MCG (400 UNIT) Tabs tablet Commonly known as: VITAMIN D3  Take 400 Units by mouth 2 (two) times daily.   clopidogrel 75 MG tablet Commonly known as: PLAVIX Take 75 mg by mouth daily. Start PLAVIX from 07/24/15 if no further hematuria is reported by patient and HB > 12.   dutasteride 0.5 MG capsule Commonly known as: AVODART Take 0.5 mg by mouth daily.   Lanoxin 0.125 MG tablet Generic drug: digoxin Take 0.125 mg by mouth daily.   levothyroxine 50 MCG tablet Commonly known as: SYNTHROID Take 50 mcg by mouth daily before breakfast.   nitroGLYCERIN 0.4 MG SL tablet Commonly known as: NITROSTAT Place 1 tablet under the tongue as needed.   pravastatin 20 MG tablet Commonly known as: PRAVACHOL  Take 1 tablet by mouth at bedtime.   triamterene-hydrochlorothiazide 37.5-25 MG capsule Commonly known as: DYAZIDE Take 1 capsule by mouth daily.       Review of Systems:  Review of Systems  Constitutional: Negative for activity change, fever and unexpected weight change.  HENT: Positive for hearing loss and trouble swallowing. Negative for congestion and voice change.   Eyes: Negative for visual disturbance.  Respiratory: Negative for cough, shortness of breath and wheezing.   Cardiovascular: Negative for chest pain, palpitations and leg swelling.  Gastrointestinal: Negative for abdominal distention, abdominal pain and constipation.  Genitourinary: Negative for difficulty urinating, dysuria and urgency.  Musculoskeletal: Positive for gait problem.  Skin: Negative for color change and pallor.  Neurological: Negative for speech difficulty, weakness, light-headedness and headaches.  Psychiatric/Behavioral: Negative for behavioral problems and sleep disturbance.    Health Maintenance  Topic Date Due  . TETANUS/TDAP  07/15/2025  . COVID-19 Vaccine  Completed  . INFLUENZA VACCINE  Discontinued  . PNA vac Low Risk Adult  Discontinued    Physical Exam: Vitals:   11/22/19 1437  BP: 116/74  Pulse: (!) 58  Temp: (!) 95.7 F (35.4 C)  SpO2: 97%  Weight: 130 lb 9.6 oz (59.2 kg)  Height: 6\' 1"  (1.854 m)   Body mass index is 17.23 kg/m. Physical Exam Vitals and nursing note reviewed.  Constitutional:      Appearance: Normal appearance.  HENT:     Head: Normocephalic and atraumatic.     Nose: Nose normal. No congestion or rhinorrhea.     Mouth/Throat:     Mouth: Mucous membranes are moist.  Eyes:     Extraocular Movements: Extraocular movements intact.     Conjunctiva/sclera: Conjunctivae normal.     Pupils: Pupils are equal, round, and reactive to light.  Cardiovascular:     Rate and Rhythm: Normal rate. Rhythm irregular.     Heart sounds: No murmur.  Pulmonary:      Breath sounds: No wheezing, rhonchi or rales.  Abdominal:     General: Bowel sounds are normal. There is no distension.     Palpations: Abdomen is soft.     Tenderness: There is no abdominal tenderness.  Musculoskeletal:     Cervical back: Normal range of motion and neck supple.     Right lower leg: Edema present.     Left lower leg: Edema present.     Comments: Trace edema BLE  Skin:    General: Skin is warm and dry.     Comments: Chronic venous insufficiency skin changes.   Neurological:     General: No focal deficit present.     Mental Status: He is alert and oriented to person, place, and time. Mental status is at baseline.  Psychiatric:  Mood and Affect: Mood normal.        Behavior: Behavior normal.        Thought Content: Thought content normal.        Judgment: Judgment normal.     Labs reviewed: Basic Metabolic Panel: Recent Labs    12/25/18 0000 12/25/18 0000 12/26/18 0720 07/10/19 0740 11/14/19 0855  NA  --   --  139 140 140  K  --   --  4.2 4.2 4.4  CL  --   --  100 101 101  CO2  --   --  32 32 34*  GLUCOSE CANCELED   < > 94 84 90  BUN  --   --  32* 42* 40*  CREATININE  --   --  1.11 1.17* 1.20*  CALCIUM  --   --  9.3 9.4 9.5  TSH CANCELED  --  3.78 3.39  --    < > = values in this interval not displayed.   Liver Function Tests: Recent Labs    12/26/18 0720 07/10/19 0740 11/14/19 0855  AST 13 16 15   ALT 9 14 13   BILITOT 0.8 0.8 0.7  PROT 6.4 6.4 6.1   No results for input(s): LIPASE, AMYLASE in the last 8760 hours. No results for input(s): AMMONIA in the last 8760 hours. CBC: Recent Labs    12/26/18 0720 07/10/19 0740 11/14/19 0855  WBC 4.7 5.2 4.5  NEUTROABS 2,176 2,907 2,070  HGB 14.6 14.1 13.4  HCT 43.8 42.0 40.7  MCV 93.2 92.7 95.8  PLT 226 211 176   Lipid Panel: Recent Labs    12/25/18 0000 12/26/18 0720 07/10/19 0740  CHOL  --  132 136  HDL  --  46 52  LDLCALC CANCELED 72 71  TRIG  --  63 51  CHOLHDL  --  2.9 2.6    No results found for: HGBA1C  Procedures since last visit: No results found.  Assessment/Plan  Dysphagia Cough associated with swallowing, refer to ST for eva/tx.   PVD (peripheral vascular disease) (HCC) 1+ edema BLE, asymptomatic cardiopulmonary aspect, weights no change from Feb 2021, the patient is advised to weigh self weekly, call if weight gain >3-5Ibs a week or developing SOB, sputum production, chest pain, palpitation etc.   Paroxysmal atrial fibrillation (HCC) Heart rate is in control, continue Digoxin  Acquired hypothyroidism Stable, continue Levothyroxine. TSH 3.39 07/10/19  BPH (benign prostatic hyperplasia) Stable, continue Dutasteride 0.5mg  qd.    Labs/tests ordered:  None  Next appt:  4 months with Dr. Lyndel Safe

## 2019-11-22 NOTE — Patient Instructions (Addendum)
Weigh self once a week, call if weight gains 3-5Ibs a week. Referral made to speech therapy for cough associated with eating/drinking. F/u in clinic [facility] 4 months.   Will call you with next apnt date.

## 2019-11-23 ENCOUNTER — Encounter: Payer: Self-pay | Admitting: Nurse Practitioner

## 2019-11-23 NOTE — Assessment & Plan Note (Signed)
Stable, continue Levothyroxine. TSH 3.39 07/10/19

## 2019-11-23 NOTE — Assessment & Plan Note (Signed)
1+ edema BLE, asymptomatic cardiopulmonary aspect, weights no change from Feb 2021, the patient is advised to weigh self weekly, call if weight gain >3-5Ibs a week or developing SOB, sputum production, chest pain, palpitation etc.

## 2019-11-23 NOTE — Assessment & Plan Note (Signed)
Cough associated with swallowing, refer to ST for eva/tx.

## 2019-11-23 NOTE — Assessment & Plan Note (Signed)
Stable, continue Dutasteride 0.5mg  qd.

## 2019-11-23 NOTE — Assessment & Plan Note (Signed)
Heart rate is in control, continue Digoxin

## 2019-12-25 ENCOUNTER — Telehealth: Payer: Self-pay

## 2019-12-25 NOTE — Telephone Encounter (Signed)
The patient is an independent living patient at [facility]. Independent living patients are seen in our clinics which is Thursday and every other Friday. Providers at [facility] do not go to [place] patients' home for visit.

## 2019-12-25 NOTE — Telephone Encounter (Signed)
Patient called concerned about his meds this am. He stated he took all of them but feels like one did not go down. Feels like a scratch in throat. He has been drinking water and has eaten breakfast and it still feels the same. He also stated that each time he calls with a concern , he never gets a call back. He would like for someone to check his throat. Please advise

## 2019-12-25 NOTE — Telephone Encounter (Signed)
Spoke with patient, discussed Manxie's response. Patient states his symptoms have resolved, he felt fine, and there is no need to be seen. Patient aware he can call for appointment at his location or at [facility] if he has any reoccurring symptoms.

## 2020-01-09 DIAGNOSIS — R1312 Dysphagia, oropharyngeal phase: Secondary | ICD-10-CM | POA: Diagnosis not present

## 2020-02-29 ENCOUNTER — Encounter: Payer: Medicare Other | Admitting: Internal Medicine

## 2020-02-29 ENCOUNTER — Encounter: Payer: Self-pay | Admitting: Internal Medicine

## 2020-02-29 ENCOUNTER — Other Ambulatory Visit: Payer: Self-pay

## 2020-03-03 NOTE — Progress Notes (Signed)
A user error has taken place.

## 2020-03-06 ENCOUNTER — Other Ambulatory Visit: Payer: Self-pay

## 2020-03-06 ENCOUNTER — Encounter: Payer: Self-pay | Admitting: Nurse Practitioner

## 2020-03-06 ENCOUNTER — Non-Acute Institutional Stay: Payer: Medicare Other | Admitting: Nurse Practitioner

## 2020-03-06 DIAGNOSIS — R413 Other amnesia: Secondary | ICD-10-CM | POA: Diagnosis not present

## 2020-03-06 DIAGNOSIS — E039 Hypothyroidism, unspecified: Secondary | ICD-10-CM | POA: Diagnosis not present

## 2020-03-06 DIAGNOSIS — N400 Enlarged prostate without lower urinary tract symptoms: Secondary | ICD-10-CM | POA: Diagnosis not present

## 2020-03-06 DIAGNOSIS — R634 Abnormal weight loss: Secondary | ICD-10-CM | POA: Diagnosis not present

## 2020-03-06 DIAGNOSIS — R1312 Dysphagia, oropharyngeal phase: Secondary | ICD-10-CM

## 2020-03-06 DIAGNOSIS — N1831 Chronic kidney disease, stage 3a: Secondary | ICD-10-CM

## 2020-03-06 DIAGNOSIS — L989 Disorder of the skin and subcutaneous tissue, unspecified: Secondary | ICD-10-CM | POA: Diagnosis not present

## 2020-03-06 DIAGNOSIS — I739 Peripheral vascular disease, unspecified: Secondary | ICD-10-CM

## 2020-03-06 DIAGNOSIS — I480 Paroxysmal atrial fibrillation: Secondary | ICD-10-CM

## 2020-03-06 NOTE — Assessment & Plan Note (Signed)
Hx of Afib, heart rate is in 50s, on Digoxin.

## 2020-03-06 NOTE — Progress Notes (Signed)
Location:   clinic [facility]   Place of Service:  Clinic (12) Provider: Marlana Latus NP  Code Status: DNR Goals of Care: [place] Advanced [place]ives 03/06/2020  Does Patient Have a Medical Advance Directive? Yes  Type of Advance Directive Out of facility DNR (pink MOST or yellow form);Healthcare Power of Attorney  Does patient want to make changes to medical advance directive? No - Patient declined  Copy of [place] in Chart? Yes - validated most recent copy scanned in chart (See row information)  Pre-existing out of facility DNR order (yellow form or pink MOST form) Yellow form placed in chart (order not valid for inpatient use)     Chief Complaint  Patient presents with  . Medical Management of Chronic Issues    Patient returns to the clinic for follow up and MMSE/clock test.     HPI: Patient is a 86 y.o. male seen today for medical management of chronic diseases.    Chronic edema BLE, off diuretics due to his inadequate oral fluid intake, stable.  Hx of Afib, heart rate is in 50s, on Digoxin.    Hypothyroidism, stable, on Levothyroxine 41mg qd. TSH 3.39 07/10/19  Urinary frequency, stable, on Finasteride 0.573mqd  CKD Bun/cret 40/1.20 11/14/19  Past Medical History:  Diagnosis Date  . Anginal pain ([place]   nuclear stress 03/12/13 EPIC  . Balance problem 07/17/2015  . Basal cell carcinoma    left forearm  . BPH (benign prostatic hyperplasia)   . Cataract, nuclear 08/08/2014  . 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 Cervical spondylosis without myelopathy 08/22/2015  . Diverticulosis   . Diverticulosis of colon without hemorrhage 08/22/2015  . Edema 08/18/2015  . Fall 07/17/2015  . Gait disturbance 08/22/2015  . GERD (gastroesophageal reflux disease)    occurs rarely  . History of CVA (cerebrovascular accident) 07/17/2015  . HLD (hyperlipidemia) 07/17/2015  . Hyperlipidemia   . Hypertension   . Inguinal hernia   . Left carotid bruit   . Melanoma ([place]   left  forearm  . Osteopenia   . Paroxysmal atrial fibrillation (HCC)   . Physical deconditioning 08/22/2015  . Retinal hemorrhage 04/24/2014  . Rosacea blepharoconjunctivitis 11/20/2011  . Stroke ([place]07   affected balance  . Tubular adenoma of colon 2007  . Weakness 07/17/2015    Past Surgical History:  Procedure Laterality Date  . COLONOSCOPY  2008  . EYE SURGERY Right    cataract extraction with IOL  . INGUINAL HERNIA REPAIR Right 03/15/2013   Procedure: HERNIA REPAIR INGUINAL ADULT;  Surgeon: ToOdis HollingsheadMD;  Location: [facility];  Service: General;  Laterality: Right;  . INSERTION OF MESH Right 03/15/2013   Procedure: INSERTION OF MESH;  Surgeon: ToOdis HollingsheadMD;  Location: [facility];  Service: General;  Laterality: Right;  . left forearm melanoma    . PROSTATE BIOPSY    . TONSILLECTOMY  1933    Allergies  Allergen Reactions  . Aspirin   . Eggs Or Egg-Derived Products Swelling    throat    Allergies as of 03/06/2020      Reactions   Aspirin    Eggs Or Egg-derived Products Swelling   throat      Medication List       Accurate as of March 06, 2020  8:02 PM. If you have any questions, ask your nurse or doctor.        cholecalciferol 10 MCG (400 UNIT) Tabs tablet Commonly known  as: VITAMIN D3 Take 400 Units by mouth 2 (two) times daily.   clopidogrel 75 MG tablet Commonly known as: PLAVIX Take 75 mg by mouth daily. Start PLAVIX from 07/24/15 if no further hematuria is reported by patient and HB > 12.   dutasteride 0.5 MG capsule Commonly known as: AVODART Take 0.5 mg by mouth daily.   Lanoxin 0.125 MG tablet Generic drug: digoxin Take 0.125 mg by mouth daily.   levothyroxine 50 MCG tablet Commonly known as: SYNTHROID Take 50 mcg by mouth daily before breakfast.   nitroGLYCERIN 0.4 MG SL tablet Commonly known as: NITROSTAT Place 1 tablet under the tongue as needed.   pravastatin 20 MG tablet Commonly known as: PRAVACHOL Take 1 tablet by mouth at  bedtime.   triamterene-hydrochlorothiazide 37.5-25 MG capsule Commonly known as: DYAZIDE Take 1 capsule by mouth daily.       Review of Systems:  Review of Systems  Constitutional: Positive for unexpected weight change. Negative for fatigue and fever.       Weight loss about #10Ibs in the past 3 months.   HENT: Positive for hearing loss and trouble swallowing. Negative for congestion and voice change.        Cough while eating.   Eyes: Negative for visual disturbance.  Respiratory: Positive for cough. Negative for choking, chest tightness, shortness of breath and wheezing.   Cardiovascular: Positive for leg swelling. Negative for chest pain and palpitations.  Gastrointestinal: Negative for abdominal pain, blood in stool, constipation, diarrhea, nausea and vomiting.  Genitourinary: Negative for difficulty urinating, dysuria and urgency.  Musculoskeletal: Positive for gait problem.  Skin: Negative for color change and pallor.  Neurological: Negative for dizziness, speech difficulty and weakness.  Psychiatric/Behavioral: Negative for behavioral problems and sleep disturbance.    Health Maintenance  Topic Date Due  . TETANUS/TDAP  07/15/2025  . COVID-19 Vaccine  Completed  . INFLUENZA VACCINE  Discontinued  . PNA vac Low Risk Adult  Discontinued    Physical Exam: Vitals:   03/06/20 1549  BP: (!) 142/78  Pulse: 66  Temp: (!) 96.6 F (35.9 C)  SpO2: 98%  Weight: 119 lb 9.6 oz (54.3 kg)  Height: _0  (1.854 m)   Body mass index is 15.78 kg/m. Physical Exam Vitals and nursing note reviewed.  Constitutional:      Appearance: Normal appearance.  HENT:     Head: Normocephalic and atraumatic.     Mouth/Throat:     Mouth: Mucous membranes are moist.  Eyes:     Extraocular Movements: Extraocular movements intact.     Conjunctiva/sclera: Conjunctivae normal.     Pupils: Pupils are equal, round, and reactive to light.  Cardiovascular:     Rate and Rhythm: Normal rate.  Rhythm irregular.     Heart sounds: No murmur heard.   Pulmonary:     Breath sounds: No rales.  Abdominal:     General: Bowel sounds are normal. There is no distension.     Palpations: Abdomen is soft.     Tenderness: There is no abdominal tenderness. There is no right CVA tenderness, left CVA tenderness or guarding.     Comments: Irregular schedule BM, but no constipation.   Musculoskeletal:     Cervical back: Normal range of motion and neck supple.     Right lower leg: Edema present.     Left lower leg: Edema present.     Comments: Trace edema BLE  Skin:    General: Skin is warm and dry.  Comments: Chronic venous insufficiency skin changes. Lateral left eyebrow a skin lesion about pencil eraser sized, ulcerated. A small bug bite like itching inner left inner thigh.   Neurological:     General: No focal deficit present.     Mental Status: He is alert and oriented to person, place, and time. Mental status is at baseline.     Gait: Gait abnormal.  Psychiatric:        Mood and Affect: Mood normal.        Behavior: Behavior normal.        Thought Content: Thought content normal.        Judgment: Judgment normal.     Labs reviewed: Basic Metabolic Panel: Recent Labs    07/10/19 0740 11/14/19 0855  NA 140 140  K 4.2 4.4  CL 101 101  CO2 32 34*  GLUCOSE 84 90  BUN 42* 40*  CREATININE 1.17* 1.20*  CALCIUM 9.4 9.5  TSH 3.39  --    Liver Function Tests: Recent Labs    07/10/19 0740 11/14/19 0855  AST 16 15  ALT 14 13  BILITOT 0.8 0.7  PROT 6.4 6.1   No results for input(s): LIPASE, AMYLASE in the last 8760 hours. No results for input(s): AMMONIA in the last 8760 hours. CBC: Recent Labs    07/10/19 0740 11/14/19 0855  WBC 5.2 4.5  NEUTROABS 2,907 2,070  HGB 14.1 13.4  HCT 42.0 40.7  MCV 92.7 95.8  PLT 211 176   Lipid Panel: Recent Labs    07/10/19 0740  CHOL 136  HDL 52  LDLCALC 71  TRIG 51  CHOLHDL 2.6   No results found for:  HGBA1C  Procedures since last visit: No results found.  Assessment/Plan  Paroxysmal atrial fibrillation (HCC) Hx of Afib, heart rate is in 50s, on Digoxin.    PVD (peripheral vascular disease) (HCC) Chronic edema BLE, off diuretics due to his inadequate oral fluid intake, stable.  BPH (benign prostatic hyperplasia) No urinary retention, continue Finasteride 0.10m qd.   CKD (chronic kidney disease) stage 3, GFR 30-59 ml/min CKD Bun/cret 40/1.20 11/14/19   Acquired hypothyroidism Hypothyroidism, stable, on Levothyroxine 531m qd. TSH 3.39 07/10/19   Impaired memory 03/06/20 MMSE 26/30, passed clock draw. Will update CBC/diff, CMP/eGFR, TSH   Weight loss About #10Ibs weight lost in the past 3 months, the patient stated his appetite, amount of food consumption about the same, denied nausea, vomiting, abd pain, indigestion, or change of trouble swallowing-coughs sometime with swallowing-not new. Will update CBC/diff, CMP/eGFR, TSH  Dysphagia About the same.   Skin lesion Lateral left eyebrow, ulcerated, size of pencil eraser, f/u Dermatology to r/o skin cancer.    Labs/tests ordered: CBC/diff, CMP/eGFR, TSH   Next appt:  2 weeks  with Dr. GuLyndel Safe

## 2020-03-06 NOTE — Assessment & Plan Note (Signed)
Hypothyroidism, stable, on Levothyroxine 88mcg qd. TSH 3.39 07/10/19

## 2020-03-06 NOTE — Assessment & Plan Note (Signed)
No urinary retention, continue Finasteride 0.5mg  qd.

## 2020-03-06 NOTE — Assessment & Plan Note (Signed)
About the same

## 2020-03-06 NOTE — Assessment & Plan Note (Addendum)
About #10Ibs weight lost in the past 3 months, the patient stated his appetite, amount of food consumption about the same, denied nausea, vomiting, abd pain, indigestion, or change of trouble swallowing-coughs sometime with swallowing-not new. Will update CBC/diff, CMP/eGFR, TSH 

## 2020-03-06 NOTE — Assessment & Plan Note (Signed)
CKD Bun/cret 40/1.20 11/14/19

## 2020-03-06 NOTE — Assessment & Plan Note (Signed)
Lateral left eyebrow, ulcerated, size of pencil eraser, f/u Dermatology to r/o skin cancer.

## 2020-03-06 NOTE — Assessment & Plan Note (Addendum)
03/06/20 MMSE 26/30, passed clock draw. Will update CBC/diff, CMP/eGFR, TSH

## 2020-03-06 NOTE — Assessment & Plan Note (Signed)
Chronic edema BLE, off diuretics due to his inadequate oral fluid intake, stable.

## 2020-03-10 DIAGNOSIS — R634 Abnormal weight loss: Secondary | ICD-10-CM | POA: Diagnosis not present

## 2020-03-11 ENCOUNTER — Other Ambulatory Visit: Payer: Self-pay

## 2020-03-11 DIAGNOSIS — R634 Abnormal weight loss: Secondary | ICD-10-CM

## 2020-03-12 LAB — CBC WITH DIFFERENTIAL/PLATELET
Absolute Monocytes: 335 cells/uL (ref 200–950)
Basophils Absolute: 51 cells/uL (ref 0–200)
Basophils Relative: 1.3 %
Eosinophils Absolute: 238 cells/uL (ref 15–500)
Eosinophils Relative: 6.1 %
HCT: 41.7 % (ref 38.5–50.0)
Hemoglobin: 13.9 g/dL (ref 13.2–17.1)
Lymphs Abs: 1396 cells/uL (ref 850–3900)
MCH: 31.3 pg (ref 27.0–33.0)
MCHC: 33.3 g/dL (ref 32.0–36.0)
MCV: 93.9 fL (ref 80.0–100.0)
MPV: 10.9 fL (ref 7.5–12.5)
Monocytes Relative: 8.6 %
Neutro Abs: 1880 cells/uL (ref 1500–7800)
Neutrophils Relative %: 48.2 %
Platelets: 189 10*3/uL (ref 140–400)
RBC: 4.44 10*6/uL (ref 4.20–5.80)
RDW: 12.8 % (ref 11.0–15.0)
Total Lymphocyte: 35.8 %
WBC: 3.9 10*3/uL (ref 3.8–10.8)

## 2020-03-12 LAB — COMPLETE METABOLIC PANEL WITH GFR
AG Ratio: 1.5 (calc) (ref 1.0–2.5)
ALT: 17 U/L (ref 9–46)
AST: 17 U/L (ref 10–35)
Albumin: 3.8 g/dL (ref 3.6–5.1)
Alkaline phosphatase (APISO): 57 U/L (ref 35–144)
BUN/Creatinine Ratio: 32 (calc) — ABNORMAL HIGH (ref 6–22)
BUN: 37 mg/dL — ABNORMAL HIGH (ref 7–25)
CO2: 31 mmol/L (ref 20–32)
Calcium: 9.1 mg/dL (ref 8.6–10.3)
Chloride: 99 mmol/L (ref 98–110)
Creat: 1.16 mg/dL — ABNORMAL HIGH (ref 0.70–1.11)
GFR, Est African American: 63 mL/min/{1.73_m2} (ref >=60)
GFR, Est Non African American: 54 mL/min/{1.73_m2} — ABNORMAL LOW (ref >=60)
Globulin: 2.5 g/dL (calc) (ref 1.9–3.7)
Glucose, Bld: 83 mg/dL (ref 65–99)
Potassium: 4.1 mmol/L (ref 3.5–5.3)
Sodium: 139 mmol/L (ref 135–146)
Total Bilirubin: 0.9 mg/dL (ref 0.2–1.2)
Total Protein: 6.3 g/dL (ref 6.1–8.1)

## 2020-03-12 LAB — TSH: TSH: 3.62 mIU/L (ref 0.40–4.50)

## 2020-03-14 ENCOUNTER — Telehealth: Payer: Self-pay | Admitting: *Deleted

## 2020-03-14 NOTE — Telephone Encounter (Signed)
His weight loss cannot be explained by his Blood work as all of them were in good Limits. He needs to start drinking some High Calorie Supplement like Boost or Ensure.Liberalize his Diet. Can eat regular diet.No restrictions. Keep close monitor of weights. If continue to loose will talk about further testing. I reviewed his Meds and none of them are new.

## 2020-03-14 NOTE — Telephone Encounter (Signed)
Elmore Guise, [place]  03/12/2020 11:34 AM EDT Back to Top    Results were given to the patient. He asked what was said about his weight loss. He stated he is down to 119 pounds and he was really concerned.   Note from 9/9 visit.  Weight loss About #10Ibs weight lost in the past 3 months, the patient stated his appetite, amount of food consumption about the same, denied nausea, vomiting, abd pain, indigestion, or change of trouble swallowing-coughs sometime with swallowing-not new. Will update CBC/diff, CMP/eGFR, TSH      Patient called back and wanted to make sure that Dr. Lyndel Safe received his concerns.  Stated that he just wanted to let you know that he is 6'1" and weighs 119. Stated that he didn't want to lose too much more weight without you being aware.

## 2020-03-14 NOTE — Telephone Encounter (Signed)
Patient notified.  Stated that he wanted an appointment with Dr. Lyndel Safe at [facility] but cannot get one till December.  Patient is very upset about this. Stated that he is going to die before then.  I offered him an appointment with [facility] and he refused stating that he has already discussed the weight loss with her and nothing was done.  I offered him an appointment to come to the office but he refused stating that he cannot come downtown.  Patient stated that he does not understand why he cannot get an appointment, that is not fair to the residents of [place]. Stated this is a serious problem and he can't even see a Dr. For it.   I apologized. Patient still requesting sooner appointment with Dr. Lyndel Safe.  He already has an appointment scheduled for 05/30/2020 with Dr. Lyndel Safe but wanted one sooner.

## 2020-04-11 ENCOUNTER — Other Ambulatory Visit: Payer: Self-pay

## 2020-04-11 ENCOUNTER — Non-Acute Institutional Stay: Payer: Medicare Other | Admitting: Internal Medicine

## 2020-04-11 ENCOUNTER — Encounter: Payer: Self-pay | Admitting: Internal Medicine

## 2020-04-11 VITALS — BP 116/74 | HR 67 | Temp 97.3°F | Ht 73.0 in | Wt 120.0 lb

## 2020-04-11 DIAGNOSIS — R634 Abnormal weight loss: Secondary | ICD-10-CM | POA: Diagnosis not present

## 2020-04-11 DIAGNOSIS — R1312 Dysphagia, oropharyngeal phase: Secondary | ICD-10-CM

## 2020-04-11 DIAGNOSIS — E785 Hyperlipidemia, unspecified: Secondary | ICD-10-CM | POA: Diagnosis not present

## 2020-04-11 DIAGNOSIS — R0789 Other chest pain: Secondary | ICD-10-CM

## 2020-04-11 DIAGNOSIS — I480 Paroxysmal atrial fibrillation: Secondary | ICD-10-CM

## 2020-04-11 DIAGNOSIS — R54 Age-related physical debility: Secondary | ICD-10-CM | POA: Diagnosis not present

## 2020-04-11 DIAGNOSIS — N400 Enlarged prostate without lower urinary tract symptoms: Secondary | ICD-10-CM

## 2020-04-11 DIAGNOSIS — N1831 Chronic kidney disease, stage 3a: Secondary | ICD-10-CM | POA: Diagnosis not present

## 2020-04-11 DIAGNOSIS — E039 Hypothyroidism, unspecified: Secondary | ICD-10-CM

## 2020-04-11 NOTE — Patient Instructions (Addendum)
Start ensures twice daily.  Can eat deserts Will cut back on your Triamterene Will have you see Dermatology in the facility

## 2020-04-11 NOTE — Progress Notes (Signed)
Location: [facility] of Service:  Clinic (12)  Provider:   Code Status:  Goals of Care:  Advanced Directives 03/06/2020  Does Patient Have a Medical Advance Directive? Yes  Type of Advance Directive Out of facility DNR (pink MOST or yellow form);Healthcare Power of Attorney  Does patient want to make changes to medical advance directive? No - Patient declined  Copy of [place] in Chart? Yes - validated most recent copy scanned in chart (See row information)  Pre-existing out of facility DNR order (yellow form or pink MOST form) Yellow form placed in chart (order not valid for inpatient use)     Chief Complaint  Patient presents with  . Medical Management of Chronic Issues    Patient returns to the clinic for follow up.     HPI: Patient is a 86 y.o. male seen today for an acute visit for Follow up of his weight loss  He has h/o PAF, Hypertension, Hyperlipidemia, BPH, H/o CVA  Has lost 20 lbs in past 1 year and 10 in past 6 months Says he eats but continues to loose weight Patient is having significant Oropharyngeal Dysphagia. Continues to cough with eating Has seen Speech therapy before. Coughing is so bad that he is now eating in his room and not going to [place] room as it is embarrassing Also patient is particular about not eating Deserts as he thinks he will get Diabetes He is getting more slower. Takes him long time to do his ADLS including just taking shower once a week.  He lives by him self in [place] out of [place]  Other issues Atypical Chest pain Takes Nitro but says it does not really help He states he has never seen a cardiologist before. But he did have Stress test done in 2014 which Showed fixed Inferior Defect but no ischemia He has been taking sublingual nitroglycerin for many years. He usually needs it every 3 to 4 months H/o PAF He is on digoxin for rate control.  Does take Plavix.  States he was told  not to take any blood thinner But per Dr Bubba Camp note he refused to take Blood thinners H/o CVA On Plavix and statin Dependent on wheel chair for his long distance and Uses Walker in his Apartment  Past Medical History:  Diagnosis Date  . Anginal pain ([place])    nuclear stress 03/12/13 EPIC  . Balance problem 07/17/2015  . Basal cell carcinoma    left forearm  . BPH (benign prostatic hyperplasia)   . Cataract, nuclear 08/08/2014  . 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 Cervical spondylosis without myelopathy 08/22/2015  . Diverticulosis   . Diverticulosis of colon without hemorrhage 08/22/2015  . Edema 08/18/2015  . Fall 07/17/2015  . Gait disturbance 08/22/2015  . GERD (gastroesophageal reflux disease)    occurs rarely  . History of CVA (cerebrovascular accident) 07/17/2015  . HLD (hyperlipidemia) 07/17/2015  . Hyperlipidemia   . Hypertension   . Inguinal hernia   . Left carotid bruit   . Melanoma ([place])    left forearm  . Osteopenia   . Paroxysmal atrial fibrillation (HCC)   . Physical deconditioning 08/22/2015  . Retinal hemorrhage 04/24/2014  . Rosacea blepharoconjunctivitis 11/20/2011  . Stroke ([place]) 3/07   affected balance  . Tubular adenoma of colon 2007  . Weakness 07/17/2015    Past Surgical History:  Procedure Laterality Date  . COLONOSCOPY  2008  . EYE SURGERY Right  cataract extraction with IOL  . INGUINAL HERNIA REPAIR Right 03/15/2013   Procedure: HERNIA REPAIR INGUINAL ADULT;  Surgeon: Odis Hollingshead, MD;  Location: [facility];  Service: General;  Laterality: Right;  . INSERTION OF MESH Right 03/15/2013   Procedure: INSERTION OF MESH;  Surgeon: Odis Hollingshead, MD;  Location: [facility];  Service: General;  Laterality: Right;  . left forearm melanoma    . PROSTATE BIOPSY    . TONSILLECTOMY  1933    Allergies  Allergen Reactions  . Aspirin   . Eggs Or Egg-Derived Products Swelling    throat    Outpatient Encounter Medications as of 04/11/2020  Medication Sig    . cholecalciferol (VITAMIN D) 400 units TABS tablet Take 400 Units by mouth 2 (two) times daily.  . clopidogrel (PLAVIX) 75 MG tablet Take 75 mg by mouth daily. Start PLAVIX from 07/24/15 if no further hematuria is reported by patient and HB > 12.  . digoxin (LANOXIN) 0.125 MG tablet Take 0.125 mg by mouth daily.  Marland Kitchen dutasteride (AVODART) 0.5 MG capsule Take 0.5 mg by mouth daily.   Marland Kitchen levothyroxine (SYNTHROID, LEVOTHROID) 50 MCG tablet Take 50 mcg by mouth daily before breakfast.  . nitroGLYCERIN (NITROSTAT) 0.4 MG SL tablet Place 1 tablet under the tongue as needed.  . pravastatin (PRAVACHOL) 20 MG tablet Take 1 tablet by mouth at bedtime.  . triamterene-hydrochlorothiazide (DYAZIDE) 37.5-25 MG capsule Take 1 capsule by mouth daily.   No facility-administered encounter medications on file as of 04/11/2020.    Review of Systems:  Review of Systems  Constitutional: Positive for activity change and unexpected weight change.  HENT: Positive for postnasal drip.   Respiratory: Positive for cough.   Cardiovascular: Negative.   Gastrointestinal: Negative.   Genitourinary: Negative.   Musculoskeletal: Positive for gait problem.  Skin: Negative.   Neurological: Positive for weakness.  Psychiatric/Behavioral: Positive for confusion.    Health Maintenance  Topic Date Due  . TETANUS/TDAP  07/15/2025  . COVID-19 Vaccine  Completed  . INFLUENZA VACCINE  Discontinued  . PNA vac Low Risk Adult  Discontinued    Physical Exam: Vitals:   04/11/20 1018  BP: 116/74  Pulse: 67  Temp: (!) 97.3 F (36.3 C)  SpO2: 97%  Weight: 120 lb (54.4 kg)  Height: 6\' 1"  (1.854 m)   Body mass index is 15.83 kg/m. Physical Exam Vitals reviewed.  Constitutional:      Appearance: Normal appearance.     Comments: Does have some Temporal wasting  Looks very frail  HENT:     Head: Normocephalic.     Comments: Has Skin lesion in Lateral side of Left eye    Nose: Nose normal.     Mouth/Throat:     Mouth:  Mucous membranes are moist.     Pharynx: Oropharynx is clear.  Eyes:     Pupils: Pupils are equal, round, and reactive to light.  Cardiovascular:     Rate and Rhythm: Normal rate and regular rhythm.     Pulses: Normal pulses.  Pulmonary:     Effort: Pulmonary effort is normal.     Breath sounds: Normal breath sounds.  Abdominal:     General: Abdomen is flat. Bowel sounds are normal.     Palpations: Abdomen is soft.  Musculoskeletal:        General: No swelling.     Cervical back: Neck supple.  Skin:    General: Skin is warm and dry.  Neurological:  General: No focal deficit present.     Mental Status: He is alert and oriented to person, place, and time.     Comments: Is very frail Needs Wheelchair to get around  Psychiatric:        Mood and Affect: Mood normal.        Thought Content: Thought content normal.        Judgment: Judgment normal.     Labs reviewed: Basic Metabolic Panel: Recent Labs    07/10/19 0740 11/14/19 0855 03/10/20 0946  NA 140 140 139  K 4.2 4.4 4.1  CL 101 101 99  CO2 32 34* 31  GLUCOSE 84 90 83  BUN 42* 40* 37*  CREATININE 1.17* 1.20* 1.16*  CALCIUM 9.4 9.5 9.1  TSH 3.39  --  3.62   Liver Function Tests: Recent Labs    07/10/19 0740 11/14/19 0855 03/10/20 0946  AST 16 15 17   ALT 14 13 17   BILITOT 0.8 0.7 0.9  PROT 6.4 6.1 6.3   No results for input(s): LIPASE, AMYLASE in the last 8760 hours. No results for input(s): AMMONIA in the last 8760 hours. CBC: Recent Labs    07/10/19 0740 11/14/19 0855 03/10/20 0946  WBC 5.2 4.5 3.9  NEUTROABS 2,907 2,070 1,880  HGB 14.1 13.4 13.9  HCT 42.0 40.7 41.7  MCV 92.7 95.8 93.9  PLT 211 176 189   Lipid Panel: Recent Labs    07/10/19 0740  CHOL 136  HDL 52  LDLCALC 71  TRIG 51  CHOLHDL 2.6   No results found for: HGBA1C  Procedures since last visit: No results found.  Assessment/Plan Weight loss Discussed about different reasons with patient including his Oropharyngeal  Dysphagia contributing He will liberalize his Diet Ensure once to twice a day to increase calories If no change then consider  ? further imaging like CT scan Patient not very keen this Also will check Digoxin level next blood draw Also decrease the diuretic to just one HCTZ  Oropharyngeal dysphagia Has seen Speech before but looks like it is getting worse Does not want to see her again Frailty Getting worse He needs to be in [place] for more help But at this time he is not interested Revisit next visit Paroxysmal atrial fibrillation (HCC) On Plavix and Digoxin As refused Anticoagulation Stage 3a chronic kidney disease ([place]) Creat stable Acquired hypothyroidism TSH normal Benign prostatic hyperplasia without lower urinary tract symptoms Continue Avodart Atypical chest pain Uses S/L Nitro PRn Hyperlipidemia LDL goal <70 Last LDL 71 Will repeat  On Statin Skin lesion Refer to dermatology    Labs/tests ordered:  * No order type specified * Next appt:  05/30/2020

## 2020-04-14 ENCOUNTER — Telehealth: Payer: Self-pay

## 2020-04-14 MED ORDER — HYDROCHLOROTHIAZIDE 25 MG PO TABS: 25.0000 mg | ORAL_TABLET | Freq: Every day | ORAL | 3 refills | Status: DC

## 2020-04-14 NOTE — Telephone Encounter (Signed)
Kadeema at [facility] states the patient has been on Dyazide (triamterene HCTZ) for a while. HCTZ 25 was sent over. Which should he be on?    [phone]

## 2020-04-14 NOTE — Telephone Encounter (Signed)
Am changing his Dyazide to HCTZ. So he should get only HCTZ 25 mg QD

## 2020-04-15 NOTE — Telephone Encounter (Signed)
Discussed with Kadeema.

## 2020-04-29 ENCOUNTER — Telehealth: Payer: Self-pay | Admitting: *Deleted

## 2020-04-29 DIAGNOSIS — C44329 Squamous cell carcinoma of skin of other parts of face: Secondary | ICD-10-CM | POA: Diagnosis not present

## 2020-04-29 DIAGNOSIS — Z85828 Personal history of other malignant neoplasm of skin: Secondary | ICD-10-CM | POA: Diagnosis not present

## 2020-04-29 DIAGNOSIS — Z85820 Personal history of malignant melanoma of skin: Secondary | ICD-10-CM | POA: Diagnosis not present

## 2020-04-29 DIAGNOSIS — D485 Neoplasm of uncertain behavior of skin: Secondary | ICD-10-CM | POA: Diagnosis not present

## 2020-04-29 NOTE — Telephone Encounter (Signed)
Patient called and stated that he saw you about a week ago and it was advised he start drinking Ensure twice daily.  Patient started drinking it twice daily and now about a week later he has started having diarrhea.  Patient wants to know if he should stop the Ensure or cut back to 1 a day.  Please Advise.

## 2020-04-30 ENCOUNTER — Telehealth: Payer: Self-pay

## 2020-04-30 NOTE — Telephone Encounter (Signed)
Per. Dr. Lyndel Safe, patient need to have blood work before the 30th to ensure potassium not getting too low.   Scheduled and notified patient.

## 2020-04-30 NOTE — Telephone Encounter (Signed)
Patient notified and agreed.  

## 2020-04-30 NOTE — Telephone Encounter (Signed)
Yes that should be fine

## 2020-05-06 ENCOUNTER — Other Ambulatory Visit: Payer: Self-pay

## 2020-05-06 DIAGNOSIS — I480 Paroxysmal atrial fibrillation: Secondary | ICD-10-CM

## 2020-05-06 DIAGNOSIS — R634 Abnormal weight loss: Secondary | ICD-10-CM

## 2020-05-06 DIAGNOSIS — Z23 Encounter for immunization: Secondary | ICD-10-CM | POA: Diagnosis not present

## 2020-05-12 LAB — COMPLETE METABOLIC PANEL WITH GFR
AG Ratio: 1.4 (calc) (ref 1.0–2.5)
ALT: 14 U/L (ref 9–46)
AST: 17 U/L (ref 10–35)
Albumin: 3.9 g/dL (ref 3.6–5.1)
Alkaline phosphatase (APISO): 59 U/L (ref 35–144)
BUN/Creatinine Ratio: 37 (calc) — ABNORMAL HIGH (ref 6–22)
BUN: 38 mg/dL — ABNORMAL HIGH (ref 7–25)
CO2: 35 mmol/L — ABNORMAL HIGH (ref 20–32)
Calcium: 9.6 mg/dL (ref 8.6–10.3)
Chloride: 100 mmol/L (ref 98–110)
Creat: 1.03 mg/dL (ref 0.70–1.11)
GFR, Est African American: 73 mL/min/{1.73_m2} (ref >=60)
GFR, Est Non African American: 63 mL/min/{1.73_m2} (ref >=60)
Globulin: 2.8 g/dL (calc) (ref 1.9–3.7)
Glucose, Bld: 82 mg/dL (ref 65–99)
Potassium: 4.1 mmol/L (ref 3.5–5.3)
Sodium: 141 mmol/L (ref 135–146)
Total Bilirubin: 0.8 mg/dL (ref 0.2–1.2)
Total Protein: 6.7 g/dL (ref 6.1–8.1)

## 2020-05-12 LAB — CBC WITH DIFFERENTIAL/PLATELET
Absolute Monocytes: 337 cells/uL (ref 200–950)
Basophils Absolute: 68 cells/uL (ref 0–200)
Basophils Relative: 2 %
Eosinophils Absolute: 218 cells/uL (ref 15–500)
Eosinophils Relative: 6.4 %
HCT: 40.9 % (ref 38.5–50.0)
Hemoglobin: 13.5 g/dL (ref 13.2–17.1)
Lymphs Abs: 1193 cells/uL (ref 850–3900)
MCH: 31.4 pg (ref 27.0–33.0)
MCHC: 33 g/dL (ref 32.0–36.0)
MCV: 95.1 fL (ref 80.0–100.0)
MPV: 10.5 fL (ref 7.5–12.5)
Monocytes Relative: 9.9 %
Neutro Abs: 1584 cells/uL (ref 1500–7800)
Neutrophils Relative %: 46.6 %
Platelets: 205 10*3/uL (ref 140–400)
RBC: 4.3 10*6/uL (ref 4.20–5.80)
RDW: 12.9 % (ref 11.0–15.0)
Total Lymphocyte: 35.1 %
WBC: 3.4 10*3/uL — ABNORMAL LOW (ref 3.8–10.8)

## 2020-05-12 LAB — DIGITOXIN LEVEL

## 2020-05-28 DIAGNOSIS — Z85828 Personal history of other malignant neoplasm of skin: Secondary | ICD-10-CM | POA: Diagnosis not present

## 2020-05-28 DIAGNOSIS — C44329 Squamous cell carcinoma of skin of other parts of face: Secondary | ICD-10-CM | POA: Diagnosis not present

## 2020-05-28 DIAGNOSIS — Z85820 Personal history of malignant melanoma of skin: Secondary | ICD-10-CM | POA: Diagnosis not present

## 2020-05-30 ENCOUNTER — Other Ambulatory Visit: Payer: Self-pay

## 2020-05-30 ENCOUNTER — Encounter: Payer: Medicare Other | Admitting: Internal Medicine

## 2020-05-30 ENCOUNTER — Telehealth: Payer: Self-pay | Admitting: Internal Medicine

## 2020-05-30 NOTE — Telephone Encounter (Signed)
Patient just called and asked when his appointment with Dr. Lyndel Safe was.  The appointment was scheduled for today.  Patient needs to see Dr. Lyndel Safe asap.  He states that he did not want to see ManXie.  Please call patient to set him up an appointment with Dr. Lyndel Safe.  She did not have any available appointments next week.  Is it possible for her to see patient today?  Please call patient.  Thank you  Cody [name]

## 2020-05-30 NOTE — Telephone Encounter (Signed)
Discussed with the patient. He is now scheduled for January 7th.

## 2020-06-04 DIAGNOSIS — Z4802 Encounter for removal of sutures: Secondary | ICD-10-CM | POA: Diagnosis not present

## 2020-06-04 DIAGNOSIS — L080 Pyoderma: Secondary | ICD-10-CM | POA: Diagnosis not present

## 2020-06-04 DIAGNOSIS — L570 Actinic keratosis: Secondary | ICD-10-CM | POA: Diagnosis not present

## 2020-06-04 DIAGNOSIS — D485 Neoplasm of uncertain behavior of skin: Secondary | ICD-10-CM | POA: Diagnosis not present

## 2020-06-05 ENCOUNTER — Telehealth: Payer: Self-pay | Admitting: Internal Medicine

## 2020-06-05 NOTE — Telephone Encounter (Signed)
Patient called and he is concerned about his access to care with Dr. Lyndel Safe.  Also he has a question regarding his appointment on 07/04/20.  Please give patient a call.  Thank you  [employer]

## 2020-06-06 NOTE — Telephone Encounter (Signed)
Called patient. He just needs some encouragement/ reassurance that he should wait until January 7th to be seen. He wants to make sure he shouldn't be seen more urgently and prefers to hear it from Dr. Lyndel Safe regarding it. He doesn't mind me calling him back with confirmation. To Dr. Lyndel Safe

## 2020-07-04 ENCOUNTER — Encounter: Payer: Self-pay | Admitting: Internal Medicine

## 2020-07-04 ENCOUNTER — Other Ambulatory Visit: Payer: Self-pay

## 2020-07-04 ENCOUNTER — Non-Acute Institutional Stay: Payer: Medicare Other | Admitting: Internal Medicine

## 2020-07-04 VITALS — BP 120/86 | HR 76 | Temp 96.8°F | Ht 73.0 in | Wt 118.4 lb

## 2020-07-04 DIAGNOSIS — R54 Age-related physical debility: Secondary | ICD-10-CM

## 2020-07-04 DIAGNOSIS — R1312 Dysphagia, oropharyngeal phase: Secondary | ICD-10-CM | POA: Diagnosis not present

## 2020-07-04 DIAGNOSIS — E039 Hypothyroidism, unspecified: Secondary | ICD-10-CM

## 2020-07-04 DIAGNOSIS — I480 Paroxysmal atrial fibrillation: Secondary | ICD-10-CM

## 2020-07-04 DIAGNOSIS — L989 Disorder of the skin and subcutaneous tissue, unspecified: Secondary | ICD-10-CM

## 2020-07-04 DIAGNOSIS — R0789 Other chest pain: Secondary | ICD-10-CM | POA: Diagnosis not present

## 2020-07-04 DIAGNOSIS — R634 Abnormal weight loss: Secondary | ICD-10-CM | POA: Diagnosis not present

## 2020-07-04 DIAGNOSIS — R059 Cough, unspecified: Secondary | ICD-10-CM

## 2020-07-04 MED ORDER — ISOSORBIDE MONONITRATE ER 30 MG PO TB24: 15.0000 mg | ORAL_TABLET | Freq: Every day | ORAL | 1 refills | Status: DC

## 2020-07-04 MED ORDER — PANTOPRAZOLE SODIUM 40 MG PO TBEC: 40.0000 mg | DELAYED_RELEASE_TABLET | Freq: Every day | ORAL | 3 refills | Status: DC

## 2020-07-04 NOTE — Progress Notes (Signed)
Location: [place] of Service:  Clinic (12)  Provider:   Code Status:  Goals of Care:  Advanced Directives 03/06/2020  Does Patient Have a Medical Advance Directive? Yes  Type of Advance Directive Out of facility DNR (pink MOST or yellow form);Healthcare Power of Attorney  Does patient want to make changes to medical advance directive? No - Patient declined  Copy of [place] in Chart? Yes - validated most recent copy scanned in chart (See row information)  Pre-existing out of facility DNR order (yellow form or pink MOST form) Yellow form placed in chart (order not valid for inpatient use)     Chief Complaint  Patient presents with  . Medical Management of Chronic Issues    Patient returns to the clinic for follow up. He recently has skin surgery. He would like to discuss his angina.     HPI: Patient is a 87 y.o. male seen today for an acute visit for Weight loss,cough and Atypical Chest pain  He has h/o PAF, Hypertension, Hyperlipidemia, BPH, H/o CVA  Weight loss and Possible Oropharyngeal Dysphagia  Has lost 20 lbs in past 1 year and 10 in past 6 months Says he eats but continues to loose weight Patient is having significant Oropharyngeal Dysphagia. Continues to cough with eating Has seen Speech therapy before. Coughing is so bad that he is now eating in his room and not going to [place] room as it is embarrassing Ensure did not work due to diarrhea. Does not want speech eval at this time  Atypical Chest pain Takes Nitro but says it does not really help any more. Taking sometimes 2-3 a day  He states he has never seen a cardiologist before. But he did have Stress test done in 2014 which Showed fixed Inferior Defect but no ischemia He has been taking sublingual nitroglycerin for many years. He use to take it every 3 to 4 months  Other issues  H/o PAF He is on digoxin for rate control. Does take Plavix. States he was told  not to take any blood thinner But per Dr Bubba Camp note he refused to take Blood thinners H/o CVA On Plavix and statin   Dependent on wheel chair for his long distance and Uses Walker in his Apartment Getting very frail Takes long time to dress up  Past Medical History:  Diagnosis Date  . Anginal pain ([place])    nuclear stress 03/12/13 EPIC  . Balance problem 07/17/2015  . Basal cell carcinoma    left forearm  . BPH (benign prostatic hyperplasia)   . Cataract, nuclear 08/08/2014  . 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 Cervical spondylosis without myelopathy 08/22/2015  . Diverticulosis   . Diverticulosis of colon without hemorrhage 08/22/2015  . Edema 08/18/2015  . Fall 07/17/2015  . Gait disturbance 08/22/2015  . GERD (gastroesophageal reflux disease)    occurs rarely  . History of CVA (cerebrovascular accident) 07/17/2015  . HLD (hyperlipidemia) 07/17/2015  . Hyperlipidemia   . Hypertension   . Inguinal hernia   . Left carotid bruit   . Melanoma ([place])    left forearm  . Osteopenia   . Paroxysmal atrial fibrillation (HCC)   . Physical deconditioning 08/22/2015  . Retinal hemorrhage 04/24/2014  . Rosacea blepharoconjunctivitis 11/20/2011  . Stroke ([place]) 3/07   affected balance  . Tubular adenoma of colon 2007  . Weakness 07/17/2015    Past Surgical History:  Procedure Laterality Date  . COLONOSCOPY  2008  . EYE SURGERY Right    cataract extraction with IOL  . INGUINAL HERNIA REPAIR Right 03/15/2013   Procedure: HERNIA REPAIR INGUINAL ADULT;  Surgeon: Odis Hollingshead, MD;  Location: [facility];  Service: General;  Laterality: Right;  . INSERTION OF MESH Right 03/15/2013   Procedure: INSERTION OF MESH;  Surgeon: Odis Hollingshead, MD;  Location: [facility];  Service: General;  Laterality: Right;  . left forearm melanoma    . PROSTATE BIOPSY    . TONSILLECTOMY  1933    Allergies  Allergen Reactions  . Aspirin   . Eggs Or Egg-Derived Products Swelling    throat    Outpatient  Encounter Medications as of 07/04/2020  Medication Sig  . cholecalciferol (VITAMIN D) 400 units TABS tablet Take 400 Units by mouth 2 (two) times daily.  . clopidogrel (PLAVIX) 75 MG tablet Take 75 mg by mouth daily. Start PLAVIX from 07/24/15 if no further hematuria is reported by patient and HB > 12.  . digoxin (LANOXIN) 0.125 MG tablet Take 0.125 mg by mouth daily.  Marland Kitchen dutasteride (AVODART) 0.5 MG capsule Take 0.5 mg by mouth daily.   . hydrochlorothiazide (HYDRODIURIL) 25 MG tablet Take 1 tablet (25 mg total) by mouth daily.  . isosorbide mononitrate (IMDUR) 30 MG 24 hr tablet Take 0.5 tablets (15 mg total) by mouth daily.  Marland Kitchen levothyroxine (SYNTHROID, LEVOTHROID) 50 MCG tablet Take 50 mcg by mouth daily before breakfast.  . nitroGLYCERIN (NITROSTAT) 0.4 MG SL tablet Place 1 tablet under the tongue as needed.  . pantoprazole (PROTONIX) 40 MG tablet Take 1 tablet (40 mg total) by mouth daily.  . pravastatin (PRAVACHOL) 20 MG tablet Take 1 tablet by mouth at bedtime.   No facility-administered encounter medications on file as of 07/04/2020.    Review of Systems:  Review of Systems  Constitutional: Positive for activity change, appetite change and unexpected weight change.  HENT: Negative.   Respiratory: Positive for cough.   Cardiovascular: Negative.   Gastrointestinal: Positive for diarrhea.  Genitourinary: Negative.   Musculoskeletal: Positive for gait problem.  Skin: Negative.   Neurological: Positive for weakness.  Psychiatric/Behavioral: Negative.     Health Maintenance  Topic Date Due  . COVID-19 Vaccine (3 - Moderna risk 4-dose series) 08/30/2019  . TETANUS/TDAP  07/15/2025  . INFLUENZA VACCINE  Discontinued  . PNA vac Low Risk Adult  Discontinued    Physical Exam: Vitals:   07/04/20 1113  BP: 120/86  Pulse: 76  Temp: (!) 96.8 F (36 C)  SpO2: 99%  Weight: 118 lb 6.4 oz (53.7 kg)  Height: 6\' 1"  (1.854 m)   Body mass index is 15.62 kg/m. Physical Exam Vitals  reviewed.  Constitutional:      Comments: Very frail  HENT:     Head: Normocephalic.     Nose: Nose normal.     Mouth/Throat:     Mouth: Mucous membranes are moist.     Pharynx: Oropharynx is clear.  Eyes:     Pupils: Pupils are equal, round, and reactive to light.  Cardiovascular:     Rate and Rhythm: Normal rate and regular rhythm.     Pulses: Normal pulses.  Pulmonary:     Effort: Pulmonary effort is normal.     Breath sounds: Normal breath sounds.  Abdominal:     General: Abdomen is flat. Bowel sounds are normal.     Palpations: Abdomen is soft.  Musculoskeletal:        General: No  swelling.     Cervical back: Neck supple.  Skin:    General: Skin is warm.  Neurological:     General: No focal deficit present.     Mental Status: He is alert and oriented to person, place, and time.  Psychiatric:        Mood and Affect: Mood normal.        Thought Content: Thought content normal.     Labs reviewed: Basic Metabolic Panel: Recent Labs    07/10/19 0740 11/14/19 0855 03/10/20 0946 05/06/20 0700  NA 140 140 139 141  K 4.2 4.4 4.1 4.1  CL 101 101 99 100  CO2 32 34* 31 35*  GLUCOSE 84 90 83 82  BUN 42* 40* 37* 38*  CREATININE 1.17* 1.20* 1.16* 1.03  CALCIUM 9.4 9.5 9.1 9.6  TSH 3.39  --  3.62  --    Liver Function Tests: Recent Labs    11/14/19 0855 03/10/20 0946 05/06/20 0700  AST 15 17 17   ALT 13 17 14   BILITOT 0.7 0.9 0.8  PROT 6.1 6.3 6.7   No results for input(s): LIPASE, AMYLASE in the last 8760 hours. No results for input(s): AMMONIA in the last 8760 hours. CBC: Recent Labs    11/14/19 0855 03/10/20 0946 05/06/20 0700  WBC 4.5 3.9 3.4*  NEUTROABS 2,070 1,880 1,584  HGB 13.4 13.9 13.5  HCT 40.7 41.7 40.9  MCV 95.8 93.9 95.1  PLT 176 189 205   Lipid Panel: Recent Labs    07/10/19 0740  CHOL 136  HDL 52  LDLCALC 71  TRIG 51  CHOLHDL 2.6   No results found for: HGBA1C  Procedures since last visit: No results  found.  Assessment/Plan 1. Weight loss ? Etiology Can consider further imaging but he says he would rather not go through that Continue support.Talked about eating high calories food Did decrease his diuretic last visit  2. Oropharyngeal dysphagia Will try Protonix to see if it helps his Cough Refusing to see speech at this time  3. Frailty Refuses to move to higher level of care  4. Atypical chest pain Does not want to see Cardiology Will start on Imdur 15 mg QD Continue on Statin 5. Cough With food Will try Protonix   6. Paroxysmal atrial fibrillation (HCC) Has refused Anticoagulation Only on Plavix and Digoxin  7. Acquired hypothyroidism TSH normal  8. Skin lesion Planning for [place] per dermatology  9. BPH On Avodart  Labs/tests ordered:  * No order type specified * Next appt:  09/12/2020

## 2020-08-04 DIAGNOSIS — Z85820 Personal history of malignant melanoma of skin: Secondary | ICD-10-CM | POA: Diagnosis not present

## 2020-08-04 DIAGNOSIS — Z85828 Personal history of other malignant neoplasm of skin: Secondary | ICD-10-CM | POA: Diagnosis not present

## 2020-08-04 DIAGNOSIS — D485 Neoplasm of uncertain behavior of skin: Secondary | ICD-10-CM | POA: Diagnosis not present

## 2020-08-04 DIAGNOSIS — C4442 Squamous cell carcinoma of skin of scalp and neck: Secondary | ICD-10-CM | POA: Diagnosis not present

## 2020-08-04 DIAGNOSIS — C44629 Squamous cell carcinoma of skin of left upper limb, including shoulder: Secondary | ICD-10-CM | POA: Diagnosis not present

## 2020-08-04 HISTORY — PX: SKIN SURGERY: SHX2413

## 2020-08-05 ENCOUNTER — Other Ambulatory Visit: Payer: Self-pay

## 2020-08-05 ENCOUNTER — Telehealth: Payer: Self-pay

## 2020-08-05 ENCOUNTER — Encounter: Payer: Self-pay | Admitting: Nurse Practitioner

## 2020-08-05 ENCOUNTER — Ambulatory Visit (INDEPENDENT_AMBULATORY_CARE_PROVIDER_SITE_OTHER): Payer: Medicare Other | Admitting: Nurse Practitioner

## 2020-08-05 DIAGNOSIS — Z0000 Encounter for general adult medical examination without abnormal findings: Secondary | ICD-10-CM

## 2020-08-05 NOTE — Telephone Encounter (Signed)
Mr. torris, house are scheduled for a virtual visit with your provider today.    Just as we do with appointments in the office, we must obtain your consent to participate.  Your consent will be active for this visit and any virtual visit you may have with one of our providers in the next 365 days.    If you have a MyChart account, I can also send a copy of this consent to you electronically.  All virtual visits are billed to your insurance company just like a traditional visit in the office.  As this is a virtual visit, video technology does not allow for your provider to perform a traditional examination.  This may limit your provider's ability to fully assess your condition.  If your provider identifies any concerns that need to be evaluated in person or the need to arrange testing such as labs, EKG, etc, we will make arrangements to do so.    Although advances in technology are sophisticated, we cannot ensure that it will always work on either your end or our end.  If the connection with a video visit is poor, we may have to switch to a telephone visit.  With either a video or telephone visit, we are not always able to ensure that we have a secure connection.   I need to obtain your verbal consent now.   Are you willing to proceed with your visit today?   JYAIR KIRALY has provided verbal consent on 08/05/2020 for a virtual visit (video or telephone).   Carroll Kinds, [facility] 08/05/2020  2:58 PM

## 2020-08-05 NOTE — Patient Instructions (Signed)
Mr. Cody [name] , Thank you for taking time to come for your Medicare Wellness Visit. I appreciate your ongoing commitment to your health goals. Please review the following plan we discussed and let me know if I can assist you in the future.   Screening recommendations/referrals: Colonoscopy agedout Recommended yearly ophthalmology/optometry visit for glaucoma screening and checkup Recommended yearly dental visit for hygiene and checkup  Vaccinations: Influenza vaccine - you have declind Pneumococcal vaccine recommended  Tdap vaccine - up to date Shingles vaccine -recommended- to get at your local pharmacy     Advanced directives: on file.   Conditions/risks identified: progressive weight loss, weakness, advanced age  Next appointment: 1 year   Preventive Care 59 Years and Older, Male Preventive care refers to lifestyle choices and visits with your health care provider that can promote health and wellness. What does preventive care include?  A yearly physical exam. This is also called an annual well check.  Dental exams once or twice a year.  Routine eye exams. Ask your health care provider how often you should have your eyes checked.  Personal lifestyle choices, including:  Daily care of your teeth and gums.  Regular physical activity.  Eating a healthy diet.  Avoiding tobacco and drug use.  Limiting alcohol use.  Practicing safe sex.  Taking low doses of aspirin every day.  Taking vitamin and mineral supplements as recommended by your health care provider. What happens during an annual well check? The services and screenings done by your health care provider during your annual well check will depend on your age, overall health, lifestyle risk factors, and family history of disease. Counseling  Your health care provider may ask you questions about your:  Alcohol use.  Tobacco use.  Drug use.  Emotional well-being.  Home and relationship well-being.  Sexual  activity.  Eating habits.  History of falls.  Memory and ability to understand (cognition).  Work and work Statistician. Screening  You may have the following tests or measurements:  Height, weight, and BMI.  Blood pressure.  Lipid and cholesterol levels. These may be checked every 5 years, or more frequently if you are over 64 years old.  Skin check.  Lung cancer screening. You may have this screening every year starting at age 20 if you have a 30-pack-year history of smoking and currently smoke or have quit within the past 15 years.  Fecal occult blood test (FOBT) of the stool. You may have this test every year starting at age 64.  Flexible sigmoidoscopy or colonoscopy. You may have a sigmoidoscopy every 5 years or a colonoscopy every 10 years starting at age 85.  Prostate cancer screening. Recommendations will vary depending on your family history and other risks.  Hepatitis C blood test.  Hepatitis B blood test.  Sexually transmitted disease (STD) testing.  Diabetes screening. This is done by checking your blood sugar (glucose) after you have not eaten for a while (fasting). You may have this done every 1-3 years.  Abdominal aortic aneurysm (AAA) screening. You may need this if you are a current or former smoker.  Osteoporosis. You may be screened starting at age 20 if you are at high risk. Talk with your health care provider about your test results, treatment options, and if necessary, the need for more tests. Vaccines  Your health care provider may recommend certain vaccines, such as:  Influenza vaccine. This is recommended every year.  Tetanus, diphtheria, and acellular pertussis (Tdap, Td) vaccine. You may need a  Td booster every 10 years.  Zoster vaccine. You may need this after age 86.  Pneumococcal 13-valent conjugate (PCV13) vaccine. One dose is recommended after age 70.  Pneumococcal polysaccharide (PPSV23) vaccine. One dose is recommended after age  69. Talk to your health care provider about which screenings and vaccines you need and how often you need them. This information is not intended to replace advice given to you by your health care provider. Make sure you discuss any questions you have with your health care provider. Document Released: 07/11/2015 Document Revised: 03/03/2016 Document Reviewed: 04/15/2015 Elsevier Interactive Patient Education  2017 [place] Prevention in the Home Falls can cause injuries. They can happen to people of all ages. There are many things you can do to make your home safe and to help prevent falls. What can I do on the outside of my home?  Regularly fix the edges of walkways and driveways and fix any cracks.  Remove anything that might make you trip as you walk through a door, such as a raised step or threshold.  Trim any bushes or trees on the path to your home.  Use bright outdoor lighting.  Clear any walking paths of anything that might make someone trip, such as rocks or tools.  Regularly check to see if handrails are loose or broken. Make sure that both sides of any steps have handrails.  Any raised decks and porches should have guardrails on the edges.  Have any leaves, snow, or ice cleared regularly.  Use sand or salt on walking paths during winter.  Clean up any spills in your garage right away. This includes oil or grease spills. What can I do in the bathroom?  Use night lights.  Install grab bars by the toilet and in the tub and shower. Do not use towel bars as grab bars.  Use non-skid mats or decals in the tub or shower.  If you need to sit down in the shower, use a plastic, non-slip stool.  Keep the floor dry. Clean up any water that spills on the floor as soon as it happens.  Remove soap buildup in the tub or shower regularly.  Attach bath mats securely with double-sided non-slip rug tape.  Do not have throw rugs and other things on the floor that can make  you trip. What can I do in the bedroom?  Use night lights.  Make sure that you have a light by your bed that is easy to reach.  Do not use any sheets or blankets that are too big for your bed. They should not hang down onto the floor.  Have a firm chair that has side arms. You can use this for support while you get dressed.  Do not have throw rugs and other things on the floor that can make you trip. What can I do in the kitchen?  Clean up any spills right away.  Avoid walking on wet floors.  Keep items that you use a lot in easy-to-reach places.  If you need to reach something above you, use a strong step stool that has a grab bar.  Keep electrical cords out of the way.  Do not use floor polish or wax that makes floors slippery. If you must use wax, use non-skid floor wax.  Do not have throw rugs and other things on the floor that can make you trip. What can I do with my stairs?  Do not leave any items on the stairs.  Make sure that there are handrails on both sides of the stairs and use them. Fix handrails that are broken or loose. Make sure that handrails are as long as the stairways.  Check any carpeting to make sure that it is firmly attached to the stairs. Fix any carpet that is loose or worn.  Avoid having throw rugs at the top or bottom of the stairs. If you do have throw rugs, attach them to the floor with carpet tape.  Make sure that you have a light switch at the top of the stairs and the bottom of the stairs. If you do not have them, ask someone to add them for you. What else can I do to help prevent falls?  Wear shoes that:  Do not have high heels.  Have rubber bottoms.  Are comfortable and fit you well.  Are closed at the toe. Do not wear sandals.  If you use a stepladder:  Make sure that it is fully opened. Do not climb a closed stepladder.  Make sure that both sides of the stepladder are locked into place.  Ask someone to hold it for you, if  possible.  Clearly mark and make sure that you can see:  Any grab bars or handrails.  First and last steps.  Where the edge of each step is.  Use tools that help you move around (mobility aids) if they are needed. These include:  Canes.  Walkers.  Scooters.  Crutches.  Turn on the lights when you go into a dark area. Replace any light bulbs as soon as they burn out.  Set up your furniture so you have a clear path. Avoid moving your furniture around.  If any of your floors are uneven, fix them.  If there are any pets around you, be aware of where they are.  Review your medicines with your doctor. Some medicines can make you feel dizzy. This can increase your chance of falling. Ask your doctor what other things that you can do to help prevent falls. This information is not intended to replace advice given to you by your health care provider. Make sure you discuss any questions you have with your health care provider. Document Released: 04/10/2009 Document Revised: 11/20/2015 Document Reviewed: 07/19/2014 Elsevier Interactive Patient Education  2017 [employer].

## 2020-08-05 NOTE — Progress Notes (Signed)
Subjective:   Cody [name] is a 87 y.o. male who presents for Medicare Annual/Subsequent preventive examination.  Review of Systems     Cardiac Risk Factors include: advanced age (>72men, >61 women);dyslipidemia;hypertension;sedentary lifestyle     Objective:    There were no vitals filed for this visit. There is no height or weight on file to calculate BMI.  Advanced Directives 08/05/2020 03/06/2020 12/06/2018 06/22/2018 12/20/2017 11/29/2017 09/23/2016  Does Patient Have a Medical Advance Directive? Yes Yes Yes Yes Yes Yes Yes  Type of Paramedic of [place];Living will Out of facility DNR (pink MOST or yellow form);Healthcare Power of [place];Living will;Out of facility DNR (pink MOST or yellow form) [place];Out of facility DNR (pink MOST or yellow form) [place];Out of facility DNR (pink MOST or yellow form) [place];Out of facility DNR (pink MOST or yellow form) [place];Out of facility DNR (pink MOST or yellow form)  Does patient want to make changes to medical advance directive? No - Patient declined No - Patient declined No - Patient declined No - Patient declined No - Patient declined No - Patient declined -  Copy of [place] in Chart? Yes - validated most recent copy scanned in chart (See row information) Yes - validated most recent copy scanned in chart (See row information) - Yes - validated most recent copy scanned in chart (See row information) Yes Yes Yes  Pre-existing out of facility DNR order (yellow form or pink MOST form) - Yellow form placed in chart (order not valid for inpatient use) - Yellow form placed in chart (order not valid for inpatient use) Yellow form placed in chart (order not valid for inpatient use) Yellow form placed in chart (order not valid for inpatient use) Yellow form placed in chart (order not valid for  inpatient use)    Current Medications (verified) Outpatient Encounter Medications as of 08/05/2020  Medication Sig  . cholecalciferol (VITAMIN D) 400 units TABS tablet Take 400 Units by mouth 2 (two) times daily.  . clopidogrel (PLAVIX) 75 MG tablet Take 75 mg by mouth daily. Start PLAVIX from 07/24/15 if no further hematuria is reported by patient and HB > 12.  . digoxin (LANOXIN) 0.125 MG tablet Take 0.125 mg by mouth daily.  Marland Kitchen dutasteride (AVODART) 0.5 MG capsule Take 0.5 mg by mouth daily.   . hydrochlorothiazide (HYDRODIURIL) 25 MG tablet Take 1 tablet (25 mg total) by mouth daily.  . isosorbide mononitrate (IMDUR) 30 MG 24 hr tablet Take 0.5 tablets (15 mg total) by mouth daily.  Marland Kitchen levothyroxine (SYNTHROID, LEVOTHROID) 50 MCG tablet Take 50 mcg by mouth daily before breakfast.  . nitroGLYCERIN (NITROSTAT) 0.4 MG SL tablet Place 1 tablet under the tongue as needed.  . pantoprazole (PROTONIX) 40 MG tablet Take 1 tablet (40 mg total) by mouth daily.  . pravastatin (PRAVACHOL) 20 MG tablet Take 1 tablet by mouth at bedtime.  . triamterene-hydrochlorothiazide (MAXZIDE-25) 37.5-25 MG tablet Take 1 tablet by mouth daily. (Patient not taking: Reported on 08/05/2020)   No facility-administered encounter medications on file as of 08/05/2020.    Allergies (verified) Aspirin and Eggs or egg-derived products   History: Past Medical History:  Diagnosis Date  . Anginal pain ([place])    nuclear stress 03/12/13 EPIC  . Balance problem 07/17/2015  . Basal cell carcinoma    left forearm  . BPH (benign prostatic hyperplasia)   .  Cataract, nuclear 08/08/2014  . 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 Cervical spondylosis without myelopathy 08/22/2015  . Diverticulosis   . Diverticulosis of colon without hemorrhage 08/22/2015  . Edema 08/18/2015  . Fall 07/17/2015  . Gait disturbance 08/22/2015  . GERD (gastroesophageal reflux disease)    occurs rarely  . History of CVA (cerebrovascular accident) 07/17/2015  .  HLD (hyperlipidemia) 07/17/2015  . Hyperlipidemia   . Hypertension   . Inguinal hernia   . Left carotid bruit   . Melanoma ([place])    left forearm  . Osteopenia   . Paroxysmal atrial fibrillation (HCC)   . Physical deconditioning 08/22/2015  . Retinal hemorrhage 04/24/2014  . Rosacea blepharoconjunctivitis 11/20/2011  . Stroke ([place]) 3/07   affected balance  . Tubular adenoma of colon 2007  . Weakness 07/17/2015   Past Surgical History:  Procedure Laterality Date  . COLONOSCOPY  2008  . EYE SURGERY Right    cataract extraction with IOL  . INGUINAL HERNIA REPAIR Right 03/15/2013   Procedure: HERNIA REPAIR INGUINAL ADULT;  Surgeon: Odis Hollingshead, MD;  Location: [facility];  Service: General;  Laterality: Right;  . INSERTION OF MESH Right 03/15/2013   Procedure: INSERTION OF MESH;  Surgeon: Odis Hollingshead, MD;  Location: [facility];  Service: General;  Laterality: Right;  . left forearm melanoma    . PROSTATE BIOPSY    . SKIN SURGERY  08/04/2020  . TONSILLECTOMY  1933   Family History  Problem Relation Age of Onset  . Cancer Mother        lung and breast  . Heart disease Father        MI  . Cancer Sister        pancreatic   Social History   Socioeconomic History  . Marital status: Divorced    Spouse name: Not on file  . Number of children: Not on file  . Years of education: Not on file  . Highest education level: Not on file  Occupational History  . Occupation: Retired Press photographer    Comment: Sales  Tobacco Use  . Smoking status: Former Smoker    Quit date: 11/05/1972    Years since quitting: 47.7  . Smokeless tobacco: Never Used  Vaping Use  . Vaping Use: Never used  Substance and Sexual Activity  . Alcohol use: No    Comment:  3 ounces wine nightly  . Drug use: No  . Sexual activity: Not on file  Other Topics Concern  . Not on file  Social History Narrative   Diet? Regular/normal      Do you drink/eat things with caffeine? no      Marital status?      divorced                               What year were you married? 1957      Do you live in a house, apartment, assisted living, condo, trailer, etc.? Apartment. Moved to [place] 08/21/2015      Is it one or more stories? 2      How many persons live in your home? Live alone      Do you have any pets in your home? (please list) no      Current or past profession: Sales      Do you exercise?      "I do now."  Type & how often? Exercise bike, 3 times weekly      Do you have a living will? no      Do you have a DNR form?    yes                              If not, do you want to discuss one?      Do you have signed POA/HPOA for forms?                Social Determinants of Health   Financial Resource Strain: Not on file  Food Insecurity: Not on file  Transportation Needs: Not on file  Physical Activity: Not on file  Stress: Not on file  Social Connections: Not on file    Tobacco Counseling Counseling given: Not Answered   Clinical Intake:  Pre-visit preparation completed: Yes  Pain : No/denies pain     BMI - recorded: 15 Nutritional Status: BMI <19  Underweight Nutritional Risks: Unintentional weight gain,Failure to thrive Diabetes: No  How often do you need to have someone help you when you read instructions, pamphlets, or other written materials from your doctor or pharmacy?: 1 - Never  Diabetic?no         Activities of Daily Living In your present state of health, do you have any difficulty performing the following activities: 08/05/2020  Hearing? N  Vision? N  Difficulty concentrating or making decisions? N  Walking or climbing stairs? Y  Dressing or bathing? N  Doing errands, shopping? Y  Preparing Food and eating ? N  Using the Toilet? N  In the past six months, have you accidently leaked urine? Y  Do you have problems with loss of bowel control? N  Managing your Medications? N  Managing your Finances? N  Housekeeping or managing your  Housekeeping? N  Some recent data might be hidden    Patient Care Team: Virgie Dad, MD as PCP - General (Internal Medicine) Danella Sensing, MD as Consulting Physician (Dermatology) Mast, Man X, NP as Nurse Practitioner (Internal Medicine)  Indicate any recent Medical Services you may have received from other than [facility] providers in the past year (date may be approximate).     Assessment:   This is a routine wellness examination for Cody [name].  Hearing/Vision screen  Hearing Screening   125Hz  250Hz  500Hz  1000Hz  2000Hz  3000Hz  4000Hz  6000Hz  8000Hz   Right ear:           Left ear:           Comments: Patient has no hearing problems.  Vision Screening Comments: Patient wears glasses. Patient has not had eye exam. Patient needs to schedule one.  Dietary issues and exercise activities discussed: Current Exercise Habits: The patient does not participate in regular exercise at present  Goals    . DIET - INCREASE WATER INTAKE    . Increase physical activity      Depression Screen PHQ 2/9 Scores 08/05/2020 09/07/2018 06/14/2017 02/26/2016 11/06/2015  PHQ - 2 Score 0 0 0 0 0    Fall Risk Fall Risk  08/05/2020 07/04/2020 03/06/2020 08/17/2019 08/03/2019  Falls in the past year? 0 0 0 0 0  Comment - - - - -  Number falls in past yr: 0 0 0 0 0  Comment - - - - -  Injury with Fall? 0 - - - -  Comment - - - - -  FALL RISK PREVENTION PERTAINING TO THE HOME:  Any stairs in or around the home? Yes  If so, are there any without handrails? No  Home free of loose throw rugs in walkways, pet beds, electrical cords, etc? Yes  Adequate lighting in your home to reduce risk of falls? Yes   ASSISTIVE DEVICES UTILIZED TO PREVENT FALLS:  Life alert? Yes  Use of a cane, walker or w/c? Yes  Grab bars in the bathroom? Yes  Shower chair or bench in shower? Yes  Elevated toilet seat or a handicapped toilet? No   TIMED UP AND GO:  Was the test performed? No .    Cognitive Function: MMSE - Mini Mental  State Exam 03/06/2020 02/22/2019 03/22/2017  Not completed: - (No Data) (No Data)  Orientation to time 4 5 5   Orientation to Place 5 5 5   Registration 3 3 3   Attention/ Calculation 3 4 5   Recall 3 3 3   Language- name 2 objects 2 2 2   Language- repeat 1 1 1   Language- follow 3 step command 3 1 3   Language- read & follow direction 1 1 1   Write a sentence 1 1 1   Copy design 0 1 1  Total score 26 27 30      6CIT Screen 08/05/2020  What Year? 0 points  What month? 0 points  What time? 0 points  Count back from 20 0 points  Months in reverse 0 points  Repeat phrase 10 points  Total Score 10    Immunizations Immunization History  Administered Date(s) Administered  . Moderna Sars-Covid-2 Vaccination 07/06/2019, 08/02/2019, 05/06/2020  . PPD Test 07/21/2015, 08/04/2015  . Tdap 07/16/2015    TDAP status: Up to date  Flu Vaccine status: Declined, Education has been provided regarding the importance of this vaccine but patient still declined. Advised may receive this vaccine at local pharmacy or Health Dept. Aware to provide a copy of the vaccination record if obtained from local pharmacy or Health Dept. Verbalized acceptance and understanding.  Pneumococcal vaccine status: Due, Education has been provided regarding the importance of this vaccine. Advised may receive this vaccine at local pharmacy or Health Dept. Aware to provide a copy of the vaccination record if obtained from local pharmacy or Health Dept. Verbalized acceptance and understanding.  Covid-19 vaccine status: Completed vaccines  Qualifies for Shingles Vaccine? Yes   Zostavax completed No   Shingrix Completed?: No.    Education has been provided regarding the importance of this vaccine. Patient has been advised to call insurance company to determine out of pocket expense if they have not yet received this vaccine. Advised may also receive vaccine at local pharmacy or Health Dept. Verbalized acceptance and  understanding.  Screening Tests Health Maintenance  Topic Date Due  . COVID-19 Vaccine (4 - Booster for Moderna series) 11/03/2020  . TETANUS/TDAP  07/15/2025  . INFLUENZA VACCINE  Discontinued  . PNA vac Low Risk Adult  Discontinued    Health Maintenance  There are no preventive care reminders to display for this patient.  Colorectal cancer screening: No longer required.   Lung Cancer Screening: (Low Dose CT Chest recommended if Age 24-80 years, 30 pack-year currently smoking OR have quit w/in 15years.) does not qualify.     Additional Screening:  Hepatitis C Screening: does not qualify;   Vision Screening: Recommended annual ophthalmology exams for early detection of glaucoma and other disorders of the eye. Is the patient up to date with their annual eye exam?  No  Who is the provider or what is the name of the office in which the patient attends annual eye exams? Unsure of name, but will look it up and call to make appt.  If pt is not established with a provider, would they like to be referred to a provider to establish care? No .   Dental Screening: Recommended annual dental exams for proper oral hygiene  Community Resource Referral / Chronic Care Management: CRR required this visit?  No   CCM required this visit?  No      Plan:     I have personally reviewed and noted the following in the patient's chart:   . Medical and social history . Use of alcohol, tobacco or illicit drugs  . Current medications and supplements . Functional ability and status . Nutritional status . Physical activity . Advanced directives . List of other physicians . Hospitalizations, surgeries, and ER visits in previous 12 months . Vitals . Screenings to include cognitive, depression, and falls . Referrals and appointments  In addition, I have reviewed and discussed with patient certain preventive protocols, quality metrics, and best practice recommendations. A written personalized  care plan for preventive services as well as general preventive health recommendations were provided to patient.     Lauree Chandler, NP   08/05/2020    Virtual Visit via Telephone Note  I connected with on 08/05/20 at  3:15 PM EST by telephone and verified that I am speaking with the correct person using two identifiers.  Location: Patient: home  Provider: [facility]    I discussed the limitations, risks, security and privacy concerns of performing an evaluation and management service by telephone and the availability of in person appointments. I also discussed with the patient that there may be a patient responsible charge related to this service. The patient expressed understanding and agreed to proceed.   I discussed the assessment and treatment plan with the patient. The patient was provided an opportunity to ask questions and all were answered. The patient agreed with the plan and demonstrated an understanding of the instructions.   The patient was advised to call back or seek an in-person evaluation if the symptoms worsen or if the condition fails to improve as anticipated.  I provided 20 minutes of non-face-to-face time during this encounter.  Cody [name]. Harle Battiest Avs printed and mailed

## 2020-08-05 NOTE — Progress Notes (Signed)
This service is provided via telemedicine  No vital signs collected/recorded due to the encounter was a telemedicine visit.   Location of patient (ex: home, work):  Home  Patient consents to a telephone visit: Yes, see encounter dated 08/05/2020  Location of the provider (ex: office, home):  [place]  Name of any referring provider:  Veleta Miners, MD  Names of all persons participating in the telemedicine service and their role in the encounter:  Sherrie Mustache, Nurse Practitioner, Carroll Kinds, CMA, and patient.   Time spent on call:  16 minutes with medical assistant

## 2020-09-12 ENCOUNTER — Other Ambulatory Visit: Payer: Self-pay

## 2020-09-12 ENCOUNTER — Encounter: Payer: Self-pay | Admitting: Internal Medicine

## 2020-09-12 ENCOUNTER — Non-Acute Institutional Stay: Payer: Medicare Other | Admitting: Internal Medicine

## 2020-09-12 VITALS — BP 152/70 | HR 69 | Temp 97.4°F | Ht 73.0 in | Wt 122.0 lb

## 2020-09-12 DIAGNOSIS — R1312 Dysphagia, oropharyngeal phase: Secondary | ICD-10-CM | POA: Diagnosis not present

## 2020-09-12 DIAGNOSIS — R54 Age-related physical debility: Secondary | ICD-10-CM | POA: Diagnosis not present

## 2020-09-12 DIAGNOSIS — E039 Hypothyroidism, unspecified: Secondary | ICD-10-CM

## 2020-09-12 DIAGNOSIS — I10 Essential (primary) hypertension: Secondary | ICD-10-CM

## 2020-09-12 DIAGNOSIS — R0789 Other chest pain: Secondary | ICD-10-CM

## 2020-09-12 DIAGNOSIS — N400 Enlarged prostate without lower urinary tract symptoms: Secondary | ICD-10-CM | POA: Diagnosis not present

## 2020-09-12 DIAGNOSIS — R634 Abnormal weight loss: Secondary | ICD-10-CM

## 2020-09-12 DIAGNOSIS — E785 Hyperlipidemia, unspecified: Secondary | ICD-10-CM

## 2020-09-12 DIAGNOSIS — I480 Paroxysmal atrial fibrillation: Secondary | ICD-10-CM | POA: Diagnosis not present

## 2020-09-12 DIAGNOSIS — R059 Cough, unspecified: Secondary | ICD-10-CM

## 2020-09-12 NOTE — Progress Notes (Signed)
Location: [place] of Service:  Clinic (12)  Provider:   Code Status:  Goals of Care:  Advanced Directives 08/05/2020  Does Patient Have a Medical Advance Directive? Yes  Type of Paramedic of [place];Living will  Does patient want to make changes to medical advance directive? No - Patient declined  Copy of [place] in Chart? Yes - validated most recent copy scanned in chart (See row information)  Pre-existing out of facility DNR order (yellow form or pink MOST form) -     Chief Complaint  Patient presents with  . Medical Management of Chronic Issues    Patients returns to the clinic for follow up.     HPI: Patient is a 87 y.o. male seen today for an acute visit for follow-up of his weight loss, cough and chest pain  He has h/o PAF, Hypertension, Hyperlipidemia, BPH, H/o CVA  Weight loss and Possible Oropharyngeal Dysphagia  Has lost 20 lbs in past 1 year and 10 in past 6 months Patient is having significant Oropharyngeal Dysphagia. Continues to cough with eating Has seen Speech therapy before I had started patient on Protonix on last visit.  He has not seen any difference that then he states that his cough is better.  And today his weight was better.  Atypical Chest pain Takes Nitro but says it does not really help any more. Taking sometimes 2-3 a day  He states he has never seen a cardiologist before. But he did have Stress test done in 2014 which Showed fixed Inferior Defect but no ischemia He has been taking sublingual nitroglycerin for many years. He use to take it every 3 to 4 months H/o PAF He is on digoxin for rate control. Does take Plavix. States he was told not to take any blood thinner But per Dr Bubba Camp note he refused to take Blood thinners H/o CVA On Plavix and statin  Dependent on wheel chair for his long distance and Uses Walker in his Apartment Getting very frail Takes long  time to dress up Past Medical History:  Diagnosis Date  . Anginal pain ([place])    nuclear stress 03/12/13 EPIC  . Balance problem 07/17/2015  . Basal cell carcinoma    left forearm  . BPH (benign prostatic hyperplasia)   . Cataract, nuclear 08/08/2014  . 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 Cervical spondylosis without myelopathy 08/22/2015  . Diverticulosis   . Diverticulosis of colon without hemorrhage 08/22/2015  . Edema 08/18/2015  . Fall 07/17/2015  . Gait disturbance 08/22/2015  . GERD (gastroesophageal reflux disease)    occurs rarely  . History of CVA (cerebrovascular accident) 07/17/2015  . HLD (hyperlipidemia) 07/17/2015  . Hyperlipidemia   . Hypertension   . Inguinal hernia   . Left carotid bruit   . Melanoma ([place])    left forearm  . Osteopenia   . Paroxysmal atrial fibrillation (HCC)   . Physical deconditioning 08/22/2015  . Retinal hemorrhage 04/24/2014  . Rosacea blepharoconjunctivitis 11/20/2011  . Stroke ([place]) 3/07   affected balance  . Tubular adenoma of colon 2007  . Weakness 07/17/2015    Past Surgical History:  Procedure Laterality Date  . COLONOSCOPY  2008  . EYE SURGERY Right    cataract extraction with IOL  . INGUINAL HERNIA REPAIR Right 03/15/2013   Procedure: HERNIA REPAIR INGUINAL ADULT;  Surgeon: Odis Hollingshead, MD;  Location: [facility];  Service: General;  Laterality: Right;  .  INSERTION OF MESH Right 03/15/2013   Procedure: INSERTION OF MESH;  Surgeon: Odis Hollingshead, MD;  Location[facility];  Service: General;  Laterality: Right;  . left forearm melanoma    . PROSTATE BIOPSY    . SKIN SURGERY  08/04/2020  . TONSILLECTOMY  1933    Allergies  Allergen Reactions  . Aspirin   . Eggs Or Egg-Derived Products Swelling    throat    Outpatient Encounter Medications as of 09/12/2020  Medication Sig  . cholecalciferol (VITAMIN D) 400 units TABS tablet Take 400 Units by mouth 2 (two) times daily.  . clopidogrel (PLAVIX) 75 MG tablet Take 75 mg by mouth  daily. Start PLAVIX from 07/24/15 if no further hematuria is reported by patient and HB > 12.  . digoxin (LANOXIN) 0.125 MG tablet Take 0.125 mg by mouth daily.  Marland Kitchen dutasteride (AVODART) 0.5 MG capsule Take 0.5 mg by mouth daily.   . hydrochlorothiazide (HYDRODIURIL) 25 MG tablet Take 1 tablet (25 mg total) by mouth daily.  . isosorbide mononitrate (IMDUR) 30 MG 24 hr tablet Take 0.5 tablets (15 mg total) by mouth daily.  Marland Kitchen levothyroxine (SYNTHROID, LEVOTHROID) 50 MCG tablet Take 50 mcg by mouth daily before breakfast.  . nitroGLYCERIN (NITROSTAT) 0.4 MG SL tablet Place 1 tablet under the tongue as needed.  . pravastatin (PRAVACHOL) 20 MG tablet Take 1 tablet by mouth at bedtime.  . [DISCONTINUED] triamterene-hydrochlorothiazide (MAXZIDE-25) 37.5-25 MG tablet Take 1 tablet by mouth daily.  . pantoprazole (PROTONIX) 40 MG tablet Take 1 tablet (40 mg total) by mouth daily.   No facility-administered encounter medications on file as of 09/12/2020.    Review of Systems:  Review of Systems  Constitutional: Positive for activity change.  HENT: Negative.   Respiratory: Positive for cough.   Cardiovascular: Negative.   Gastrointestinal: Negative.   Genitourinary: Negative.   Musculoskeletal: Positive for arthralgias and gait problem.  Skin: Negative.   Neurological: Positive for weakness.  Psychiatric/Behavioral: Negative.     Health Maintenance  Topic Date Due  . COVID-19 Vaccine (4 - Booster for Moderna series) 11/03/2020  . TETANUS/TDAP  07/15/2025  . HPV VACCINES  Aged Out  . INFLUENZA VACCINE  Discontinued  . PNA vac Low Risk Adult  Discontinued    Physical Exam: Vitals:   09/12/20 1019  BP: (!) 152/70  Pulse: 69  Temp: (!) 97.4 F (36.3 C)  SpO2: 97%  Weight: 122 lb (55.3 kg)  Height: 6\' 1"  (1.854 m)   Body mass index is 16.1 kg/m. Physical Exam Vitals reviewed.  Constitutional:      Comments: Very  frail  HENT:     Head: Normocephalic.     Nose: Nose normal.      Mouth/Throat:     Mouth: Mucous membranes are moist.     Pharynx: Oropharynx is clear.  Eyes:     Pupils: Pupils are equal, round, and reactive to light.  Cardiovascular:     Rate and Rhythm: Normal rate and regular rhythm.     Pulses: Normal pulses.  Pulmonary:     Effort: Pulmonary effort is normal.     Breath sounds: Normal breath sounds.  Abdominal:     General: Abdomen is flat. Bowel sounds are normal.     Palpations: Abdomen is soft.  Musculoskeletal:        General: No swelling.     Cervical back: Neck supple.  Skin:    General: Skin is warm.  Neurological:  General: No focal deficit present.     Mental Status: He is alert and oriented to person, place, and time.  Psychiatric:        Mood and Affect: Mood normal.        Thought Content: Thought content normal.     Labs reviewed: Basic Metabolic Panel: Recent Labs    11/14/19 0855 03/10/20 0946 05/06/20 0700  NA 140 139 141  K 4.4 4.1 4.1  CL 101 99 100  CO2 34* 31 35*  GLUCOSE 90 83 82  BUN 40* 37* 38*  CREATININE 1.20* 1.16* 1.03  CALCIUM 9.5 9.1 9.6  TSH  --  3.62  --    Liver Function Tests: Recent Labs    11/14/19 0855 03/10/20 0946 05/06/20 0700  AST 15 17 17   ALT 13 17 14   BILITOT 0.7 0.9 0.8  PROT 6.1 6.3 6.7   No results for input(s): LIPASE, AMYLASE in the last 8760 hours. No results for input(s): AMMONIA in the last 8760 hours. CBC: Recent Labs    11/14/19 0855 03/10/20 0946 05/06/20 0700  WBC 4.5 3.9 3.4*  NEUTROABS 2,070 1,880 1,584  HGB 13.4 13.9 13.5  HCT 40.7 41.7 40.9  MCV 95.8 93.9 95.1  PLT 176 189 205   Lipid Panel: No results for input(s): CHOL, HDL, LDLCALC, TRIG, CHOLHDL, LDLDIRECT in the last 8760 hours. No results found for: HGBA1C  Procedures since last visit: No results found.  Assessment/Plan 1. Weight loss Has gained some weight He says he is eating the same Will continue to monitor  Has refused Imaging Was not able to tolerate Ensure  - CBC  with Differential/Platelet; Future - COMPLETE METABOLIC PANEL WITH GFR; Future - TSH; Future  2. Oropharyngeal dysphagia Does not think Protonix helped with his coughing but then says his cough is better Has refused to see Speech says it did not help Refused GI Consult Will Continue Protonix for now  3. Frailty Refuses to move to Higher level of care  4. Atypical chest pain On Imdur Uses S/L Nitro PRN Refuses to see Cardiology  5. Cough Combination of Dysphagia and reflux  6. Paroxysmal atrial fibrillation (HCC) Refused Anticoagulation On Plavix and Digoxin   7. Acquired hypothyroidism  - TSH; Future  8. Benign prostatic hyperplasia without lower urinary tract symptoms On Avadart  9. Hyperlipidemia LDL goal <70  - Lipid panel; Future  10. Essential hypertension Mildily high Had Changed his Maxizide to HCTZ Continue to monitor    Labs/tests ordered:  * No order type specified * Next appt:  11/25/2020

## 2020-10-07 ENCOUNTER — Encounter: Payer: Self-pay | Admitting: Internal Medicine

## 2020-10-07 ENCOUNTER — Non-Acute Institutional Stay: Payer: Medicare Other | Admitting: Internal Medicine

## 2020-10-07 DIAGNOSIS — N400 Enlarged prostate without lower urinary tract symptoms: Secondary | ICD-10-CM

## 2020-10-07 DIAGNOSIS — R54 Age-related physical debility: Secondary | ICD-10-CM

## 2020-10-07 DIAGNOSIS — R1312 Dysphagia, oropharyngeal phase: Secondary | ICD-10-CM

## 2020-10-07 DIAGNOSIS — I480 Paroxysmal atrial fibrillation: Secondary | ICD-10-CM

## 2020-10-07 DIAGNOSIS — I10 Essential (primary) hypertension: Secondary | ICD-10-CM

## 2020-10-07 DIAGNOSIS — R634 Abnormal weight loss: Secondary | ICD-10-CM

## 2020-10-07 DIAGNOSIS — R0789 Other chest pain: Secondary | ICD-10-CM

## 2020-10-07 DIAGNOSIS — E039 Hypothyroidism, unspecified: Secondary | ICD-10-CM

## 2020-10-07 DIAGNOSIS — E785 Hyperlipidemia, unspecified: Secondary | ICD-10-CM | POA: Diagnosis not present

## 2020-10-07 NOTE — Progress Notes (Signed)
Provider:  Veleta Miners MD Location:   [facility] Room Number: 517 Place of Service:  ALF ([phone])  PCP: Wardell Honour, MD Patient Care Team: Wardell Honour, MD as PCP - General (Family Medicine) Danella Sensing, MD as Consulting Physician (Dermatology) Mast, Man X, NP as Nurse Practitioner (Internal Medicine)  Extended Emergency Contact Information Primary Emergency Contact: Nee,Bill Address: [address]          [place], [place] [address] Johnnette Litter of [place] Phone: [phone] Mobile Phone: [phone] Relation: Brother Secondary Emergency Contact: [place]aland [place] Mobile Phone: [phone] Relation: Relative  Code Status: DNR Goals of Care: Advanced Directive information Advanced Directives 08/05/2020  Does Patient Have a Medical Advance Directive? Yes  Type of Paramedic of [place];Living will  Does patient want to make changes to medical advance directive? No - Patient declined  Copy of [place] in Chart? Yes - validated most recent copy scanned in chart (See row information)  Pre-existing out of facility DNR order (yellow form or pink MOST form) -      Chief Complaint  Patient presents with  . New Admit To SNF    Admission to [place]    HPI: Patient is a 87 y.o. male seen today for admission to [place]   He has h/o PAF, Hypertension, Hyperlipidemia, BPH, H/o CVA  Weight loss and Possible Oropharyngeal Dysphagia  Admitted to [place] from [place] apartment He decided to finally move as he was getting weaker and unable to take care of himself.  His active problems Oropharyngeal dysphagia Has seen speech therapy before.  Continues to have cough with eating.  Refuses further work-up Has lost almost 10 pounds in past 6 months. Recent weight has been stable  Atypical chest pain Takes nitro as needed but states it does not help. he did have Stress test done in 2014  which Showed fixed Inferior Defect but no ischemia He has been taking sublingual nitroglycerin for many years. He use to take itevery 3 to 4 months H/o PAF He is on digoxin for rate control. Does take Plavix. States he was told not to take any blood thinner But per Dr Bubba Camp note he refused to take Blood thinners H/o CVA On Plavix and statin  Mostly dependent on wheelchair for his ambulation   Past Medical History:  Diagnosis Date  . Anginal pain ([place])    nuclear stress 03/12/13 EPIC  . Balance problem 07/17/2015  . Basal cell carcinoma    left forearm  . BPH (benign prostatic hyperplasia)   . Cataract, nuclear 08/08/2014  . 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 Cervical spondylosis without myelopathy 08/22/2015  . Diverticulosis   . Diverticulosis of colon without hemorrhage 08/22/2015  . Edema 08/18/2015  . Fall 07/17/2015  . Gait disturbance 08/22/2015  . GERD (gastroesophageal reflux disease)    occurs rarely  . History of CVA (cerebrovascular accident) 07/17/2015  . HLD (hyperlipidemia) 07/17/2015  . Hyperlipidemia   . Hypertension   . Inguinal hernia   . Left carotid bruit   . Melanoma ([place])    left forearm  . Osteopenia   . Paroxysmal atrial fibrillation (HCC)   . Physical deconditioning 08/22/2015  . Retinal hemorrhage 04/24/2014  . Rosacea blepharoconjunctivitis 11/20/2011  . Stroke ([place]) 3/07   affected balance  . Tubular adenoma of colon 2007  . Weakness 07/17/2015   Past Surgical History:  Procedure Laterality Date  . COLONOSCOPY  2008  .  EYE SURGERY Right    cataract extraction with IOL  . INGUINAL HERNIA REPAIR Right 03/15/2013   Procedure: HERNIA REPAIR INGUINAL ADULT;  Surgeon: Odis Hollingshead, MD;  Location: [facility];  Service: General;  Laterality: Right;  . INSERTION OF MESH Right 03/15/2013   Procedure: INSERTION OF MESH;  Surgeon: Odis Hollingshead, MD;  Location: [facility];  Service: General;  Laterality: Right;  . left forearm melanoma    . PROSTATE  BIOPSY    . SKIN SURGERY  08/04/2020  . TONSILLECTOMY  1933    reports that he quit smoking about 47 years ago. He has never used smokeless tobacco. He reports that he does not drink alcohol and does not use drugs. Social History   Socioeconomic History  . Marital status: Divorced    Spouse name: Not on file  . Number of children: Not on file  . Years of education: Not on file  . Highest education level: Not on file  Occupational History  . Occupation: Retired Press photographer    Comment: Sales  Tobacco Use  . Smoking status: Former Smoker    Quit date: 11/05/1972    Years since quitting: 47.9  . Smokeless tobacco: Never Used  Vaping Use  . Vaping Use: Never used  Substance and Sexual Activity  . Alcohol use: No    Comment:  3 ounces wine nightly  . Drug use: No  . Sexual activity: Not on file  Other Topics Concern  . Not on file  Social History Narrative   Diet? Regular/normal      Do you drink/eat things with caffeine? no      Marital status?      divorced                              What year were you married? 1957      Do you live in a house, apartment, assisted living, condo, trailer, etc.? Apartment. Moved to [place] 08/21/2015      Is it one or more stories? 2      How many persons live in your home? Live alone      Do you have any pets in your home? (please list) no      Current or past profession: Sales      Do you exercise?      "I do now."                            Type & how often? Exercise bike, 3 times weekly      Do you have a living will? no      Do you have a DNR form?    yes                              If not, do you want to discuss one?      Do you have signed POA/HPOA for forms?                Social Determinants of Health   Financial Resource Strain: Not on file  Food Insecurity: Not on file  Transportation Needs: Not on file  Physical Activity: Not on file  Stress: Not on file  Social Connections: Not on file  Intimate Partner Violence: Not on  file    Functional Status Survey:  Family History  Problem Relation Age of Onset  . Cancer Mother        lung and breast  . Heart disease Father        MI  . Cancer Sister        pancreatic    Health Maintenance  Topic Date Due  . COVID-19 Vaccine (4 - Booster for Moderna series) 11/03/2020  . TETANUS/TDAP  07/15/2025  . HPV VACCINES  Aged Out  . INFLUENZA VACCINE  Discontinued  . PNA vac Low Risk Adult  Discontinued    Allergies  Allergen Reactions  . Aspirin   . Eggs Or Egg-Derived Products Swelling    throat    Allergies as of 10/07/2020      Reactions   Aspirin    Eggs Or Egg-derived Products Swelling   throat      Medication List       Accurate as of October 07, 2020 11:41 AM. If you have any questions, ask your nurse or doctor.        cholecalciferol 10 MCG (400 UNIT) Tabs tablet Commonly known as: VITAMIN D3 Take 400 Units by mouth 2 (two) times daily.   clopidogrel 75 MG tablet Commonly known as: PLAVIX Take 75 mg by mouth daily. Start PLAVIX from 07/24/15 if no further hematuria is reported by patient and HB > 12.   digoxin 0.125 MG tablet Commonly known as: LANOXIN Take 0.125 mg by mouth daily.   dutasteride 0.5 MG capsule Commonly known as: AVODART Take 0.5 mg by mouth daily.   hydrochlorothiazide 25 MG tablet Commonly known as: HYDRODIURIL Take 1 tablet (25 mg total) by mouth daily.   isosorbide mononitrate 30 MG 24 hr tablet Commonly known as: IMDUR Take 0.5 tablets (15 mg total) by mouth daily.   levothyroxine 50 MCG tablet Commonly known as: SYNTHROID Take 50 mcg by mouth daily before breakfast.   nitroGLYCERIN 0.4 MG SL tablet Commonly known as: NITROSTAT Place 1 tablet under the tongue as needed.   pantoprazole 40 MG tablet Commonly known as: PROTONIX Take 1 tablet (40 mg total) by mouth daily.   pravastatin 20 MG tablet Commonly known as: PRAVACHOL Take 1 tablet by mouth at bedtime.       Review of Systems   Constitutional: Positive for activity change.  HENT: Negative.   Respiratory: Positive for cough.   Cardiovascular: Negative.   Gastrointestinal: Negative.   Genitourinary: Negative.   Musculoskeletal: Positive for gait problem.  Skin: Negative.   Neurological: Positive for weakness.  Psychiatric/Behavioral: Negative.     Vitals:   10/07/20 1134  BP: 118/90  Pulse: 78  Resp: 20  Temp: (!) 97.1 F (36.2 C)  SpO2: 93%  Weight: 122 lb (55.3 kg)  Height: 6\' 1"  (1.854 m)   Body mass index is 16.1 kg/m. Physical Exam Vitals reviewed.  Constitutional:      Appearance: Normal appearance.  Cardiovascular:     Rate and Rhythm: Normal rate and regular rhythm.     Pulses: Normal pulses.  Pulmonary:     Effort: Pulmonary effort is normal. No respiratory distress.     Breath sounds: Normal breath sounds. No wheezing.  Abdominal:     General: Abdomen is flat. Bowel sounds are normal.     Palpations: Abdomen is soft.  Musculoskeletal:        General: No swelling.     Cervical back: Neck supple.  Skin:    General: Skin is warm.  Neurological:  General: No focal deficit present.     Mental Status: He is alert and oriented to person, place, and time.  Psychiatric:        Mood and Affect: Mood normal.        Thought Content: Thought content normal.     Labs reviewed: Basic Metabolic Panel: Recent Labs    11/14/19 0855 03/10/20 0946 05/06/20 0700  NA 140 139 141  K 4.4 4.1 4.1  CL 101 99 100  CO2 34* 31 35*  GLUCOSE 90 83 82  BUN 40* 37* 38*  CREATININE 1.20* 1.16* 1.03  CALCIUM 9.5 9.1 9.6   Liver Function Tests: Recent Labs    11/14/19 0855 03/10/20 0946 05/06/20 0700  AST 15 17 17   ALT 13 17 14   BILITOT 0.7 0.9 0.8  PROT 6.1 6.3 6.7   No results for input(s): LIPASE, AMYLASE in the last 8760 hours. No results for input(s): AMMONIA in the last 8760 hours. CBC: Recent Labs    11/14/19 0855 03/10/20 0946 05/06/20 0700  WBC 4.5 3.9 3.4*   NEUTROABS 2,070 1,880 1,584  HGB 13.4 13.9 13.5  HCT 40.7 41.7 40.9  MCV 95.8 93.9 95.1  PLT 176 189 205   Cardiac Enzymes: No results for input(s): CKTOTAL, CKMB, CKMBINDEX, TROPONINI in the last 8760 hours. BNP: Invalid input(s): POCBNP No results found for: HGBA1C Lab Results  Component Value Date   TSH 3.62 03/10/2020   No results found for: VITAMINB12 No results found for: FOLATE No results found for: IRON, TIBC, FERRITIN  Imaging and Procedures obtained prior to SNF admission: No results found.  Assessment/Plan Weight loss Weight has stabilized Refused Imaging Did not tolerate Ensure  Oropharyngeal dysphagia Continue on Protonix Does not want speech as he thinks it would not help Refuses GI Consult Essential hypertension BP on lower side Cut HCTZ to half BP qd For 2 weeks  Frailty Agree with his move to [place] Atypical chest pain On Imdur Also takes Nitro PRN Refuses further work up Paroxysmal atrial fibrillation (HCC) On Digoxin and Plavix Refused DOCA before  Acquired hypothyroidism Repeat TSH Benign prostatic hyperplasia without lower urinary tract symptoms On Avadart Hyperlipidemia LDL goal <70 Repeat Lipid On Statin for his h/o CAD    Family/ staff Communication:   Labs/tests ordered: CBC,CMP,TSH,Lipid Panel

## 2020-10-09 DIAGNOSIS — I10 Essential (primary) hypertension: Secondary | ICD-10-CM | POA: Diagnosis not present

## 2020-10-10 LAB — HEPATIC FUNCTION PANEL
ALT: 11 (ref 10–40)
AST: 19 (ref 14–40)
Alkaline Phosphatase: 62 (ref 25–125)
Bilirubin, Total: 0.6

## 2020-10-10 LAB — CBC AND DIFFERENTIAL
HCT: 40 — AB (ref 41–53)
Hemoglobin: 13 — AB (ref 13.5–17.5)
Neutrophils Absolute: 2830
Platelets: 312 (ref 150–399)
WBC: 5

## 2020-10-10 LAB — BASIC METABOLIC PANEL
BUN: 39 — AB (ref 4–21)
CO2: 33 — AB (ref 13–22)
Chloride: 99 (ref 99–108)
Creatinine: 1.1 (ref 0.6–1.3)
Glucose: 70
Potassium: 4.2 (ref 3.4–5.3)
Sodium: 142 (ref 137–147)

## 2020-10-10 LAB — COMPREHENSIVE METABOLIC PANEL
Albumin: 3.9 (ref 3.5–5.0)
Calcium: 9.6 (ref 8.7–10.7)
GFR calc Af Amer: 64
GFR calc non Af Amer: 55
Globulin: 3.1

## 2020-10-10 LAB — TSH: TSH: 4.3 (ref 0.41–5.90)

## 2020-10-10 LAB — LIPID PANEL
Cholesterol: 147 (ref 0–200)
HDL: 53 (ref 35–70)
LDL Cholesterol: 81
Triglycerides: 51 (ref 40–160)

## 2020-10-10 LAB — CBC: RBC: 4.33 (ref 3.87–5.11)

## 2020-11-19 ENCOUNTER — Non-Acute Institutional Stay: Payer: Medicare Other | Admitting: Nurse Practitioner

## 2020-11-19 ENCOUNTER — Encounter: Payer: Self-pay | Admitting: Nurse Practitioner

## 2020-11-19 DIAGNOSIS — E039 Hypothyroidism, unspecified: Secondary | ICD-10-CM | POA: Diagnosis not present

## 2020-11-19 DIAGNOSIS — N400 Enlarged prostate without lower urinary tract symptoms: Secondary | ICD-10-CM

## 2020-11-19 DIAGNOSIS — I480 Paroxysmal atrial fibrillation: Secondary | ICD-10-CM | POA: Diagnosis not present

## 2020-11-19 DIAGNOSIS — N1831 Chronic kidney disease, stage 3a: Secondary | ICD-10-CM

## 2020-11-19 DIAGNOSIS — R609 Edema, unspecified: Secondary | ICD-10-CM

## 2020-11-19 NOTE — Assessment & Plan Note (Signed)
heart rate is in 50s, on Digoxin.

## 2020-11-19 NOTE — Assessment & Plan Note (Signed)
stable, on Levothyroxine 49mcg qd. TSH 4.3 10/10/20

## 2020-11-19 NOTE — Assessment & Plan Note (Addendum)
increased edema BLE, more in the LLE, the patient denied pain in leg with R+L foot dorsiflexion, mild erythema, no open wounds. Hx of Edema BLE, off Diuretics. The patient admitted he sleeps on his left side most of time. Denied cough, phlegm production, SOB, chest pain/pressure, palpitation, he is afebrile, no O2 desaturation. Will US venous BLE to r/o DVT Will start Furosemide 10mg  Kcl 40meq qod in setting of low Bp already, wt 2x/wk ac breakfast. Observe.

## 2020-11-19 NOTE — Progress Notes (Signed)
Location:   [facility] Room Number: [place] of Service:  ALF [phone]) Provider:  Ghadeer Kastelic Otho Darner, NP    Patient Care Team: Wardell Honour, MD as PCP - General (Family Medicine) Danella Sensing, MD as Consulting Physician (Dermatology) Golda Zavalza X, NP as Nurse Practitioner (Internal Medicine)  Extended Emergency Contact Information Primary Emergency Contact: Brasil,Bill Address: [address]          [place], [place] [address] Johnnette Litter of [place] Phone: [phone] Mobile Phone: [phone] Relation: Brother Secondary Emergency Contact: Rosaland [place] Mobile Phone: [phone] Relation: Relative  Code Status:  DNR Goals of care: Advanced Directive information Advanced Directives 11/19/2020  Does Patient Have a Medical Advance Directive? Yes  Type of Paramedic of [place];Out of facility DNR (pink MOST or yellow form)  Does patient want to make changes to medical advance directive? No - Patient declined  Copy of [place] in Chart? Yes - validated most recent copy scanned in chart (See row information)  Pre-existing out of facility DNR order (yellow form or pink MOST form) Yellow form placed in chart (order not valid for inpatient use)     Chief Complaint  Patient presents with  . Acute Visit    Patient complains from edema in both legs.     HPI:  Pt is a 87 y.o. male seen today for an acute visit for increased edema BLE, more in the LLE, the patient denied pain in leg with R+L foot dorsiflexion, mild erythema, no open wounds. Hx of Edema BLE, off Diuretics. The patient admitted he sleeps on his left side most of time. Denied cough, phlegm production, SOB, chest pain/pressure, palpitation, he is afebrile, no O2 desaturation.      Chronic edema BLE, off diuretics due to his inadequate oral fluid intake, stable.             Hx of Afib, heart rate is in 50s, on Digoxin.               Hypothyroidism, stable, on Levothyroxine 42mg qd. TSH 4.3 10/10/20             Urinary frequency, stable, on Finasteride 0.590mqd            CKD Bun/cret 39/1.1 10/10/20  Past Medical History:  Diagnosis Date  . Anginal pain ([place]   nuclear stress 03/12/13 EPIC  . Balance problem 07/17/2015  . Basal cell carcinoma    left forearm  . BPH (benign prostatic hyperplasia)   . Cataract, nuclear 08/08/2014  . 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 Cervical spondylosis without myelopathy 08/22/2015  . Diverticulosis   . Diverticulosis of colon without hemorrhage 08/22/2015  . Edema 08/18/2015  . Fall 07/17/2015  . Gait disturbance 08/22/2015  . GERD (gastroesophageal reflux disease)    occurs rarely  . History of CVA (cerebrovascular accident) 07/17/2015  . HLD (hyperlipidemia) 07/17/2015  . Hyperlipidemia   . Hypertension   . Inguinal hernia   . Left carotid bruit   . Melanoma ([facility]   left forearm  . Osteopenia   . Paroxysmal atrial fibrillation (HCC)   . Physical deconditioning 08/22/2015  . Retinal hemorrhage 04/24/2014  . Rosacea blepharoconjunctivitis 11/20/2011  . Stroke ([facility]   affected balance  . Tubular adenoma of colon 2007  . Weakness 07/17/2015   Past Surgical History:  Procedure Laterality Date  . COLONOSCOPY  2008  . EYE SURGERY  Right    cataract extraction with IOL  . INGUINAL HERNIA REPAIR Right 03/15/2013   Procedure: HERNIA REPAIR INGUINAL ADULT;  Surgeon: Odis Hollingshead, MD;  [facility];  Service: General;  Laterality: Right;  . INSERTION OF MESH Right 03/15/2013   Procedure: INSERTION OF MESH;  Surgeon: Odis Hollingshead, MD;  [facility];  Service: General;  Laterality: Right;  . left forearm melanoma    . PROSTATE BIOPSY    . SKIN SURGERY  08/04/2020  . TONSILLECTOMY  1933    Allergies  Allergen Reactions  . Aspirin   . Eggs Or Egg-Derived Products Swelling    throat    Allergies as of 11/19/2020      Reactions   Aspirin    Eggs  Or Egg-derived Products Swelling   throat      Medication List       Accurate as of Nov 19, 2020  3:21 PM. If you have any questions, ask your nurse or doctor.        cholecalciferol 10 MCG (400 UNIT) Tabs tablet Commonly known as: VITAMIN D3 Take 400 Units by mouth 2 (two) times daily.   clopidogrel 75 MG tablet Commonly known as: PLAVIX Take 75 mg by mouth daily. Start PLAVIX from 07/24/15 if no further hematuria is reported by patient and HB > 12.   digoxin 0.125 MG tablet Commonly known as: LANOXIN Take 0.125 mg by mouth daily.   dutasteride 0.5 MG capsule Commonly known as: AVODART Take 0.5 mg by mouth daily.   hydrochlorothiazide 12.5 MG capsule Commonly known as: MICROZIDE Take 12.5 mg by mouth daily. What changed: Another medication with the same name was removed. Continue taking this medication, and follow the directions you see here. Changed by: Chabeli Barsamian X Carmino Ocain, NP   isosorbide mononitrate 30 MG 24 hr tablet Commonly known as: IMDUR Take 0.5 tablets (15 mg total) by mouth daily.   levothyroxine 50 MCG tablet Commonly known as: SYNTHROID Take 50 mcg by mouth daily before breakfast.   nitroGLYCERIN 0.4 MG SL tablet Commonly known as: NITROSTAT Place 1 tablet under the tongue as needed.   pantoprazole 40 MG tablet Commonly known as: PROTONIX Take 1 tablet (40 mg total) by mouth daily.   pravastatin 20 MG tablet Commonly known as: PRAVACHOL Take 1 tablet by mouth at bedtime.       Review of Systems  Constitutional: Negative for activity change, appetite change and fever.       Weight loss about #10Ibs in the past 3 months.   HENT: Positive for hearing loss and trouble swallowing. Negative for congestion.        Cough while eating.   Eyes: Negative for visual disturbance.  Respiratory: Negative for cough, shortness of breath and wheezing.   Cardiovascular: Positive for leg swelling. Negative for chest pain and palpitations.  Gastrointestinal: Negative  for abdominal pain and constipation.  Genitourinary: Positive for frequency. Negative for dysuria and urgency.  Musculoskeletal: Positive for gait problem.  Skin: Negative for color change.  Neurological: Negative for speech difficulty, weakness, light-headedness and headaches.  Psychiatric/Behavioral: Negative for behavioral problems and sleep disturbance.    Immunization History  Administered Date(s) Administered  . Moderna Sars-Covid-2 Vaccination 07/06/2019, 08/02/2019, 05/06/2020  . PPD Test 07/21/2015, 08/04/2015  . Tdap 07/16/2015   Pertinent  Health Maintenance Due  Topic Date Due  . INFLUENZA VACCINE  Discontinued  . PNA vac Low Risk Adult  Discontinued   Fall Risk  09/12/2020 08/05/2020  07/04/2020 03/06/2020 08/17/2019  Falls in the past year? 0 0 0 0 0  Comment - - - - -  Number falls in past yr: 0 0 0 0 0  Comment - - - - -  Injury with Fall? - 0 - - -  Comment - - - - -   Functional Status Survey:    Vitals:   11/19/20 1102  BP: (!) 91/54  Pulse: 67  Resp: 16  Temp: (!) 97.2 F (36.2 C)  SpO2: 97%  Weight: 116 lb 3.2 oz (52.7 kg)  Height: 6' 1" (1.854 m)   Body mass index is 15.33 kg/m. Physical Exam Vitals and nursing note reviewed.  Constitutional:      Appearance: Normal appearance.  HENT:     Head: Normocephalic and atraumatic.     Mouth/Throat:     Mouth: Mucous membranes are moist.  Eyes:     Extraocular Movements: Extraocular movements intact.     Conjunctiva/sclera: Conjunctivae normal.     Pupils: Pupils are equal, round, and reactive to light.  Cardiovascular:     Rate and Rhythm: Normal rate. Rhythm irregular.     Heart sounds: No murmur heard.   Pulmonary:     Breath sounds: No rales.  Abdominal:     General: Bowel sounds are normal.     Palpations: Abdomen is soft.     Tenderness: There is no abdominal tenderness.     Comments: Irregular schedule BM, but no constipation.   Musculoskeletal:     Cervical back: Normal range of motion  and neck supple.     Right lower leg: Edema present.     Left lower leg: Edema present.     Comments: 2+ edema LLE, 1+ edema RLE  Skin:    General: Skin is warm and dry.     Findings: Erythema present.     Comments: Chronic venous insufficiency skin changes  Neurological:     General: No focal deficit present.     Mental Status: He is alert and oriented to person, place, and time. Mental status is at baseline.     Gait: Gait abnormal.  Psychiatric:        Mood and Affect: Mood normal.        Behavior: Behavior normal.        Thought Content: Thought content normal.        Judgment: Judgment normal.     Labs reviewed: Recent Labs    03/10/20 0946 05/06/20 0700 10/10/20 0019  NA 139 141 142  K 4.1 4.1 4.2  CL 99 100 99  CO2 31 35* 33*  GLUCOSE 83 82  --   BUN 37* 38* 39*  CREATININE 1.16* 1.03 1.1  CALCIUM 9.1 9.6 9.6   Recent Labs    03/10/20 0946 05/06/20 0700 10/10/20 0019  AST 17 17 19  ALT 17 14 11  ALKPHOS  --   --  62  BILITOT 0.9 0.8  --   PROT 6.3 6.7  --   ALBUMIN  --   --  3.9   Recent Labs    03/10/20 0946 05/06/20 0700 10/10/20 0019  WBC 3.9 3.4* 5.0  NEUTROABS 1,880 1,584 2,830.00  HGB 13.9 13.5 13.0*  HCT 41.7 40.9 40*  MCV 93.9 95.1  --   PLT 189 205 312   Lab Results  Component Value Date   TSH 4.30 10/10/2020   No results found for: HGBA1C Lab Results  Component Value Date     CHOL 147 10/10/2020   HDL 53 10/10/2020   LDLCALC 81 10/10/2020   TRIG 51 10/10/2020   CHOLHDL 2.6 07/10/2019    Significant Diagnostic Results in last 30 days:  No results found.  Assessment/Plan Edema increased edema BLE, more in the LLE, the patient denied pain in leg with R+L foot dorsiflexion, mild erythema, no open wounds. Hx of Edema BLE, off Diuretics. The patient admitted he sleeps on his left side most of time. Denied cough, phlegm production, SOB, chest pain/pressure, palpitation, he is afebrile, no O2 desaturation. Will US venous BLE to r/o  DVT Will start Furosemide 10mg Kcl 10meq qod in setting of low Bp already, wt 2x/wk ac breakfast. Observe.   Paroxysmal atrial fibrillation (HCC) heart rate is in 50s, on Digoxin.    Acquired hypothyroidism stable, on Levothyroxine 50mcg qd. TSH 4.3 10/10/20   BPH (benign prostatic hyperplasia) stable, on Finasteride 0.5mg qd   CKD (chronic kidney disease) stage 3, GFR 30-59 ml/min (HCC)  CKD Bun/cret 39/1.1 10/10/20      Family/ staff Communication: plan of care reviewed with the patient and charge nurse.   Labs/tests ordered:  CMP/eGFR one week, venous US BLE  Time spend 40 minutes.   

## 2020-11-19 NOTE — Assessment & Plan Note (Signed)
CKD Bun/cret 39/1.1 10/10/20

## 2020-11-19 NOTE — Assessment & Plan Note (Signed)
stable, on Finasteride 0.5mg  qd

## 2020-11-20 DIAGNOSIS — R601 Generalized edema: Secondary | ICD-10-CM | POA: Diagnosis not present

## 2020-11-25 ENCOUNTER — Other Ambulatory Visit: Payer: Self-pay

## 2020-11-25 DIAGNOSIS — R634 Abnormal weight loss: Secondary | ICD-10-CM

## 2020-11-25 DIAGNOSIS — E039 Hypothyroidism, unspecified: Secondary | ICD-10-CM

## 2020-11-25 DIAGNOSIS — Z23 Encounter for immunization: Secondary | ICD-10-CM | POA: Diagnosis not present

## 2020-11-25 DIAGNOSIS — E785 Hyperlipidemia, unspecified: Secondary | ICD-10-CM

## 2020-11-26 ENCOUNTER — Encounter: Payer: Medicare Other | Admitting: Family Medicine

## 2020-11-26 ENCOUNTER — Other Ambulatory Visit: Payer: Self-pay

## 2020-11-27 DIAGNOSIS — R609 Edema, unspecified: Secondary | ICD-10-CM | POA: Diagnosis not present

## 2020-11-27 DIAGNOSIS — I10 Essential (primary) hypertension: Secondary | ICD-10-CM | POA: Diagnosis not present

## 2020-11-27 LAB — HEPATIC FUNCTION PANEL
ALT: 7 — AB (ref 10–40)
AST: 12 — AB (ref 14–40)
Alkaline Phosphatase: 55 (ref 25–125)
Bilirubin, Total: 0.6

## 2020-11-27 LAB — BASIC METABOLIC PANEL
BUN: 39 — AB (ref 4–21)
CO2: 35 — AB (ref 13–22)
Chloride: 101 (ref 99–108)
Creatinine: 1 (ref 0.6–1.3)
Glucose: 85
Potassium: 3.8 (ref 3.4–5.3)
Sodium: 140 (ref 137–147)

## 2020-11-27 LAB — COMPREHENSIVE METABOLIC PANEL
Albumin: 3.4 — AB (ref 3.5–5.0)
Calcium: 9.5 (ref 8.7–10.7)
GFR calc Af Amer: 76
GFR calc non Af Amer: 65
Globulin: 2.5

## 2020-11-28 ENCOUNTER — Encounter: Payer: Self-pay | Admitting: Internal Medicine

## 2020-12-01 ENCOUNTER — Encounter: Payer: Self-pay | Admitting: Nurse Practitioner

## 2020-12-01 ENCOUNTER — Non-Acute Institutional Stay: Payer: Medicare Other | Admitting: Nurse Practitioner

## 2020-12-01 DIAGNOSIS — I480 Paroxysmal atrial fibrillation: Secondary | ICD-10-CM

## 2020-12-01 DIAGNOSIS — I209 Angina pectoris, unspecified: Secondary | ICD-10-CM

## 2020-12-01 DIAGNOSIS — K219 Gastro-esophageal reflux disease without esophagitis: Secondary | ICD-10-CM

## 2020-12-01 DIAGNOSIS — I739 Peripheral vascular disease, unspecified: Secondary | ICD-10-CM | POA: Diagnosis not present

## 2020-12-01 DIAGNOSIS — N400 Enlarged prostate without lower urinary tract symptoms: Secondary | ICD-10-CM | POA: Diagnosis not present

## 2020-12-01 DIAGNOSIS — E039 Hypothyroidism, unspecified: Secondary | ICD-10-CM

## 2020-12-01 DIAGNOSIS — E785 Hyperlipidemia, unspecified: Secondary | ICD-10-CM

## 2020-12-01 DIAGNOSIS — T148XXA Other injury of unspecified body region, initial encounter: Secondary | ICD-10-CM | POA: Diagnosis not present

## 2020-12-01 DIAGNOSIS — L89312 Pressure ulcer of right buttock, stage 2: Secondary | ICD-10-CM | POA: Insufficient documentation

## 2020-12-01 DIAGNOSIS — N1831 Chronic kidney disease, stage 3a: Secondary | ICD-10-CM | POA: Diagnosis not present

## 2020-12-01 NOTE — Assessment & Plan Note (Signed)
heart rate is in 50s, on Digoxin.

## 2020-12-01 NOTE — Assessment & Plan Note (Signed)
BLE, mostly LLE, the patient admitted he sleeps on his left side most of time. Furosemide 10mg   since 11/19/20, negative [place]

## 2020-12-01 NOTE — Assessment & Plan Note (Signed)
takes Pravastatin, LDL 81 11/27/20

## 2020-12-01 NOTE — Assessment & Plan Note (Signed)
stable, on Levothyroxine 75mcg qd. TSH 4.3 10/10/20

## 2020-12-01 NOTE — Assessment & Plan Note (Signed)
Bun/cret 39/1.0 11/27/20

## 2020-12-01 NOTE — Assessment & Plan Note (Signed)
Right buttock, a quarter sized, scar overgrowth, pain when sitting on it, not open, will use Gel Cushion, Air mattress overlay, encourage the patient repositioning self frequently, apply barrie oint, cover with foam dressing. Observe.

## 2020-12-01 NOTE — Assessment & Plan Note (Addendum)
Urinary frequency, stable, on Dutasteride 0.5mg qd 

## 2020-12-01 NOTE — Progress Notes (Signed)
Location:   [facility] Room Number: [phone] Place of Service:  ALF [phone]) Provider:  Karsen Fellows Otho Darner, NP    Patient Care Team: Wardell Honour, MD as PCP - General (Family Medicine) Danella Sensing, MD as Consulting Physician (Dermatology) Romina Divirgilio X, NP as Nurse Practitioner (Internal Medicine)  Extended Emergency Contact Information Primary Emergency Contact: Ida,Bill Address: [address]          [place], [place] [address] Johnnette Litter of [place] Phone: [phone] Mobile Phone: [phone] Relation: Brother Secondary Emergency Contact: [place] of [place] Mobile Phone: [phone] Relation: Relative  Code Status: DNR  Goals of care: Advanced Directive information Advanced Directives 12/01/2020  Does Patient Have a Medical Advance Directive? Yes  Type of Paramedic of [place];Out of facility DNR (pink MOST or yellow form)  Does patient want to make changes to medical advance directive? No - Patient declined  Copy of [place] in Chart? Yes - validated most recent copy scanned in chart (See row information)  Pre-existing out of facility DNR order (yellow form or pink MOST form) Yellow form placed in chart (order not valid for inpatient use)     Chief Complaint  Patient presents with  . Acute Visit    Patient being seen for a wound on buttocks.    HPI:  Pt is a 87 y.o. male seen today for an acute visit for Right buttock, a quarter sized, scar overgrowth, pain when sitting on it, not open.                     Chronic edema BLE, mostly LLE, the patient admitted he sleeps on his left side most of time. Furosemide 10mg   since 11/19/20, negative [place], on HCTZ 12.5mg  qd Hx of Afib, heart rate is in 50s, on Digoxin.  Hypothyroidism, stable, on Levothyroxine 52mcg qd. TSH 4.3 10/10/20 Urinary frequency, stable, on Dutasteride 0.5mg  qd CKD  Bun/cret 39/1.0 11/27/20  GERD takes Pantoprazole, Hgb 13.0 10/10/20  Hyperlipidemia, takes Pravastatin, LDL 81 11/27/20  CAD/angina pectoris, takes Plavix, Isosorbide      Past Medical History:  Diagnosis Date  . Anginal pain ([place])    nuclear stress 03/12/13 EPIC  . Balance problem 07/17/2015  . Basal cell carcinoma    left forearm  . BPH (benign prostatic hyperplasia)   . Cataract, nuclear 08/08/2014  . 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 Cervical spondylosis without myelopathy 08/22/2015  . Diverticulosis   . Diverticulosis of colon without hemorrhage 08/22/2015  . Edema 08/18/2015  . Fall 07/17/2015  . Gait disturbance 08/22/2015  . GERD (gastroesophageal reflux disease)    occurs rarely  . History of CVA (cerebrovascular accident) 07/17/2015  . HLD (hyperlipidemia) 07/17/2015  . Hyperlipidemia   . Hypertension   . Inguinal hernia   . Left carotid bruit   . Melanoma ([place])    left forearm  . Osteopenia   . Paroxysmal atrial fibrillation (HCC)   . Physical deconditioning 08/22/2015  . Retinal hemorrhage 04/24/2014  . Rosacea blepharoconjunctivitis 11/20/2011  . Stroke ([place]) 3/07   affected balance  . Tubular adenoma of colon 2007  . Weakness 07/17/2015   Past Surgical History:  Procedure Laterality Date  . COLONOSCOPY  2008  . EYE SURGERY Right    cataract extraction with IOL  . INGUINAL HERNIA REPAIR Right 03/15/2013   Procedure: HERNIA REPAIR INGUINAL ADULT;  Surgeon: Odis Hollingshead, MD;  Location: [facility];  Service:  General;  Laterality: Right;  . INSERTION OF MESH Right 03/15/2013   Procedure: INSERTION OF MESH;  Surgeon: Odis Hollingshead, MD;  Location: [facility];  Service: General;  Laterality: Right;  . left forearm melanoma    . PROSTATE BIOPSY    . SKIN SURGERY  08/04/2020  . TONSILLECTOMY  1933    Allergies  Allergen Reactions  . Aspirin   . Eggs Or Egg-Derived Products Swelling    throat    Allergies as of 12/01/2020      Reactions   Aspirin    Eggs Or  Egg-derived Products Swelling   throat      Medication List       Accurate as of December 01, 2020 12:16 PM. If you have any questions, ask your nurse or doctor.        cholecalciferol 10 MCG (400 UNIT) Tabs tablet Commonly known as: VITAMIN D3 Take 400 Units by mouth 2 (two) times daily.   clopidogrel 75 MG tablet Commonly known as: PLAVIX Take 75 mg by mouth daily. Start PLAVIX from 07/24/15 if no further hematuria is reported by patient and HB > 12.   digoxin 0.125 MG tablet Commonly known as: LANOXIN Take 0.125 mg by mouth daily.   dutasteride 0.5 MG capsule Commonly known as: AVODART Take 0.5 mg by mouth daily.   furosemide 20 MG tablet Commonly known as: LASIX Take 10 mg by mouth daily.   hydrochlorothiazide 12.5 MG capsule Commonly known as: MICROZIDE Take 12.5 mg by mouth daily.   isosorbide mononitrate 30 MG 24 hr tablet Commonly known as: IMDUR Take 0.5 tablets (15 mg total) by mouth daily.   levothyroxine 50 MCG tablet Commonly known as: SYNTHROID Take 50 mcg by mouth daily before breakfast.   nitroGLYCERIN 0.4 MG SL tablet Commonly known as: NITROSTAT Place 1 tablet under the tongue as needed.   pantoprazole 40 MG tablet Commonly known as: PROTONIX Take 1 tablet (40 mg total) by mouth daily.   POTASSIUM CHLORIDE ER PO Take 1 capsule by mouth every other day.   pravastatin 20 MG tablet Commonly known as: PRAVACHOL Take 1 tablet by mouth at bedtime.       Review of Systems  Constitutional: Negative for activity change, appetite change and fever.  HENT: Positive for hearing loss and trouble swallowing. Negative for congestion.        Cough while eating.   Eyes: Negative for visual disturbance.  Respiratory: Negative for cough, shortness of breath and wheezing.   Cardiovascular: Positive for leg swelling. Negative for chest pain and palpitations.  Gastrointestinal: Negative for abdominal pain and constipation.  Genitourinary: Positive for  frequency. Negative for dysuria and urgency.  Musculoskeletal: Positive for gait problem.  Skin: Positive for wound. Negative for color change.  Neurological: Negative for speech difficulty, weakness and headaches.  Psychiatric/Behavioral: Negative for behavioral problems and sleep disturbance.    Immunization History  Administered Date(s) Administered  . Moderna Sars-Covid-2 Vaccination 07/06/2019, 08/02/2019, 05/06/2020  . PPD Test 07/21/2015, 08/04/2015  . Tdap 07/16/2015   Pertinent  Health Maintenance Due  Topic Date Due  . INFLUENZA VACCINE  Discontinued  . PNA vac Low Risk Adult  Discontinued   Fall Risk  09/12/2020 08/05/2020 07/04/2020 03/06/2020 08/17/2019  Falls in the past year? 0 0 0 0 0  Comment - - - - -  Number falls in past yr: 0 0 0 0 0  Comment - - - - -  Injury with Fall? - 0 - - -  Comment - - - - -   Functional Status Survey:    Vitals:   12/01/20 1021  BP: 128/78  Pulse: 76  Resp: 20  Temp: 97.6 F (36.4 C)  SpO2: 96%  Weight: 120 lb 9.6 oz (54.7 kg)  Height: 6\' 1"  (1.854 m)   Body mass index is 15.91 kg/m. Physical Exam Vitals and nursing note reviewed.  Constitutional:      Appearance: Normal appearance.  HENT:     Head: Normocephalic and atraumatic.     Mouth/Throat:     Mouth: Mucous membranes are moist.  Eyes:     Extraocular Movements: Extraocular movements intact.     Conjunctiva/sclera: Conjunctivae normal.     Pupils: Pupils are equal, round, and reactive to light.  Cardiovascular:     Rate and Rhythm: Normal rate. Rhythm irregular.     Heart sounds: No murmur heard.   Pulmonary:     Breath sounds: No rales.  Abdominal:     General: Bowel sounds are normal.     Palpations: Abdomen is soft.     Tenderness: There is no abdominal tenderness.     Comments: Irregular schedule BM, but no constipation.   Musculoskeletal:     Cervical back: Normal range of motion and neck supple.     Right lower leg: Edema present.     Left lower  leg: Edema present.     Comments: 2+ edema LLE, 1+ edema RLE  Skin:    General: Skin is warm and dry.     Comments: Chronic venous insufficiency skin changes. R buttock a quarter sized scar tissue overgrowth at the previous site of pressure ulcer area, not open, pain palpated and when the patient sitting on it or slid from side to side while sitting.   Neurological:     General: No focal deficit present.     Mental Status: He is alert and oriented to person, place, and time. Mental status is at baseline.     Gait: Gait abnormal.  Psychiatric:        Mood and Affect: Mood normal.        Behavior: Behavior normal.        Thought Content: Thought content normal.        Judgment: Judgment normal.     Labs reviewed: Recent Labs    03/10/20 0946 05/06/20 0700 10/10/20 0019 11/27/20 0000  NA 139 141 142 140  K 4.1 4.1 4.2 3.8  CL 99 100 99 101  CO2 31 35* 33* 35*  GLUCOSE 83 82  --   --   BUN 37* 38* 39* 39*  CREATININE 1.16* 1.03 1.1 1.0  CALCIUM 9.1 9.6 9.6 9.5   Recent Labs    03/10/20 0946 05/06/20 0700 10/10/20 0019 11/27/20 0000  AST 17 17 19  12*  ALT 17 14 11  7*  ALKPHOS  --   --  62 55  BILITOT 0.9 0.8  --   --   PROT 6.3 6.7  --   --   ALBUMIN  --   --  3.9 3.4*   Recent Labs    03/10/20 0946 05/06/20 0700 10/10/20 0019  WBC 3.9 3.4* 5.0  NEUTROABS 1,880 1,584 2,830.00  HGB 13.9 13.5 13.0*  HCT 41.7 40.9 40*  MCV 93.9 95.1  --   PLT 189 205 312   Lab Results  Component Value Date   TSH 4.30 10/10/2020   No results found for: HGBA1C Lab Results  Component Value  Date   CHOL 147 10/10/2020   HDL 53 10/10/2020   LDLCALC 81 10/10/2020   TRIG 51 10/10/2020   CHOLHDL 2.6 07/10/2019    Significant Diagnostic Results in last 30 days:  No results found.  Assessment/Plan Deep tissue injury Right buttock, a quarter sized, scar overgrowth, pain when sitting on it, not open, will use Gel Cushion, Air mattress overlay, encourage the patient  repositioning self frequently, apply barrie oint, cover with foam dressing. Observe.   PVD (peripheral vascular disease) (HCC) BLE, mostly LLE, the patient admitted he sleeps on his left side most of time. Furosemide 10mg   since 11/19/20, negative [place]   Paroxysmal atrial fibrillation (HCC) heart rate is in 50s, on Digoxin.   Acquired hypothyroidism stable, on Levothyroxine 60mcg qd. TSH 4.3 10/10/20   BPH (benign prostatic hyperplasia) Urinary frequency, stable, on Dutasteride 0.5mg  qd   CKD (chronic kidney disease) stage 3, GFR 30-59 ml/min (HCC) Bun/cret 39/1.0 11/27/20   GERD (gastroesophageal reflux disease) takes Pantoprazole, Hgb 13.0 10/10/20  Hyperlipidemia LDL goal <70 takes Pravastatin, LDL 81 11/27/20   Angina pectoris ([place]) takes Plavix, Isosorbide   Family/ staff Communication: plan of care reviewed with the patient and charge nurse.   Labs/tests ordered:  none  Time spend 40 minutes.

## 2020-12-01 NOTE — Assessment & Plan Note (Signed)
takes Pantoprazole, Hgb 13.0 10/10/20

## 2020-12-01 NOTE — Assessment & Plan Note (Signed)
takes Plavix, Isosorbide

## 2020-12-05 ENCOUNTER — Encounter: Payer: Self-pay | Admitting: Nurse Practitioner

## 2020-12-05 ENCOUNTER — Non-Acute Institutional Stay: Payer: Medicare Other | Admitting: Nurse Practitioner

## 2020-12-05 DIAGNOSIS — N1831 Chronic kidney disease, stage 3a: Secondary | ICD-10-CM | POA: Diagnosis not present

## 2020-12-05 DIAGNOSIS — E039 Hypothyroidism, unspecified: Secondary | ICD-10-CM | POA: Diagnosis not present

## 2020-12-05 DIAGNOSIS — R609 Edema, unspecified: Secondary | ICD-10-CM | POA: Diagnosis not present

## 2020-12-05 DIAGNOSIS — I209 Angina pectoris, unspecified: Secondary | ICD-10-CM

## 2020-12-05 DIAGNOSIS — N400 Enlarged prostate without lower urinary tract symptoms: Secondary | ICD-10-CM

## 2020-12-05 DIAGNOSIS — K219 Gastro-esophageal reflux disease without esophagitis: Secondary | ICD-10-CM

## 2020-12-05 DIAGNOSIS — I480 Paroxysmal atrial fibrillation: Secondary | ICD-10-CM | POA: Diagnosis not present

## 2020-12-05 DIAGNOSIS — L89312 Pressure ulcer of right buttock, stage 2: Secondary | ICD-10-CM | POA: Diagnosis not present

## 2020-12-05 DIAGNOSIS — E785 Hyperlipidemia, unspecified: Secondary | ICD-10-CM | POA: Diagnosis not present

## 2020-12-05 NOTE — Progress Notes (Addendum)
Location:   [facility] Room Number: [phone] Place of Service:  ALF [phone]) Provider:  Anella Nakata Otho Darner, NP    Patient Care Team: Wardell Honour, MD as PCP - General (Family Medicine) Danella Sensing, MD as Consulting Physician (Dermatology) Aeisha Minarik X, NP as Nurse Practitioner (Internal Medicine)  Extended Emergency Contact Information Primary Emergency Contact: Coultas,Bill Address: [address]          [place],  [address] Johnnette Litter of [place] Phone: [phone] Mobile Phone: [phone] Relation: Brother Secondary Emergency Contact: [place] Mobile Phone: [phone] Relation: Relative  Code Status:  DNR Goals of care: Advanced Directive information Advanced Directives 12/05/2020  Does Patient Have a Medical Advance Directive? Yes  Type of Paramedic of [place];Out of facility DNR (pink MOST or yellow form)  Does patient want to make changes to medical advance directive? No - Patient declined  Copy of [place] in Chart? Yes - validated most recent copy scanned in chart (See row information)  Pre-existing out of facility DNR order (yellow form or pink MOST form) Yellow form placed in chart (order not valid for inpatient use)     Chief Complaint  Patient presents with  . Acute Visit    Patient presents for right buttock pressure ulcer     HPI:  Pt is a 87 y.o. male seen today for an acute visit for right buttock pressure ulcer developed from previous deep tissue injury with overgrowth scar tissue area, opened, fresh tissue at wound bed, no odorous drainage, no s/s of infection. Air mattress overlay, gel cushion in w/c, the patient is aware of frequent repositioning for pressure reduction.    Chronic edema BLE, mostly LLE, improved, the patient admitted he sleeps on his left side most of time. Furosemide 10mg   since 11/19/20, negative [place], on HCTZ 12.5mg  qd              Hx of Afib, heart rate is in 50s, on Digoxin.               Hypothyroidism, stable, on Levothyroxine 37mcg qd. TSH 4.3 10/10/20             Urinary frequency, stable, on Dutasteride 0.5mg  qd             CKD Bun/cret 39/1.0 11/27/20             GERD takes Pantoprazole, Hgb 13.0 10/10/20             Hyperlipidemia, takes Pravastatin, LDL 81 11/27/20             CAD/angina pectoris, takes Plavix, Isosorbide                Past Medical History:  Diagnosis Date  . Anginal pain ([place])    nuclear stress 03/12/13 EPIC  . Balance problem 07/17/2015  . Basal cell carcinoma    left forearm  . BPH (benign prostatic hyperplasia)   . Cataract, nuclear 08/08/2014  . 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 Cervical spondylosis without myelopathy 08/22/2015  . Diverticulosis   . Diverticulosis of colon without hemorrhage 08/22/2015  . Edema 08/18/2015  . Fall 07/17/2015  . Gait disturbance 08/22/2015  . GERD (gastroesophageal reflux disease)    occurs rarely  . History of CVA (cerebrovascular accident) 07/17/2015  . HLD (hyperlipidemia) 07/17/2015  . Hyperlipidemia   . Hypertension   . Inguinal hernia   . Left carotid bruit   .  Melanoma ([place])    left forearm  . Osteopenia   . Paroxysmal atrial fibrillation (HCC)   . Physical deconditioning 08/22/2015  . Retinal hemorrhage 04/24/2014  . Rosacea blepharoconjunctivitis 11/20/2011  . Stroke ([place]) 3/07   affected balance  . Tubular adenoma of colon 2007  . Weakness 07/17/2015   Past Surgical History:  Procedure Laterality Date  . COLONOSCOPY  2008  . EYE SURGERY Right    cataract extraction with IOL  . INGUINAL HERNIA REPAIR Right 03/15/2013   Procedure: HERNIA REPAIR INGUINAL ADULT;  Surgeon: Odis Hollingshead, MD;  [facility]: [facility];  Service: General;  Laterality: Right;  . INSERTION OF MESH Right 03/15/2013   Procedure: INSERTION OF MESH;  Surgeon: Odis Hollingshead, MD;  [facility]: [facility];  Service: General;  Laterality: Right;  . left forearm melanoma     . PROSTATE BIOPSY    . SKIN SURGERY  08/04/2020  . TONSILLECTOMY  1933    Allergies  Allergen Reactions  . Aspirin   . Eggs Or Egg-Derived Products Swelling    throat    Allergies as of 12/05/2020       Reactions   Aspirin    Eggs Or Egg-derived Products Swelling   throat        Medication List        Accurate as of December 05, 2020  1:10 PM. If you have any questions, ask your nurse or doctor.          cholecalciferol 10 MCG (400 UNIT) Tabs tablet Commonly known as: VITAMIN D3 Take 400 Units by mouth 2 (two) times daily.   clopidogrel 75 MG tablet Commonly known as: PLAVIX Take 75 mg by mouth daily. Start PLAVIX from 07/24/15 if no further hematuria is reported by patient and HB > 12.   digoxin 0.125 MG tablet Commonly known as: LANOXIN Take 0.125 mg by mouth daily.   dutasteride 0.5 MG capsule Commonly known as: AVODART Take 0.5 mg by mouth daily.   furosemide 20 MG tablet Commonly known as: LASIX Take 10 mg by mouth daily.   hydrochlorothiazide 12.5 MG capsule Commonly known as: MICROZIDE Take 12.5 mg by mouth daily.   isosorbide mononitrate 30 MG 24 hr tablet Commonly known as: IMDUR Take 0.5 tablets (15 mg total) by mouth daily.   levothyroxine 50 MCG tablet Commonly known as: SYNTHROID Take 50 mcg by mouth daily before breakfast.   nitroGLYCERIN 0.4 MG SL tablet Commonly known as: NITROSTAT Place 1 tablet under the tongue as needed.   pantoprazole 40 MG tablet Commonly known as: PROTONIX Take 1 tablet (40 mg total) by mouth daily.   POTASSIUM CHLORIDE ER PO Take 1 capsule by mouth every other day.   pravastatin 20 MG tablet Commonly known as: PRAVACHOL Take 1 tablet by mouth at bedtime.        Review of Systems  Constitutional:  Negative for activity change, appetite change and fever.  HENT:  Positive for hearing loss and trouble swallowing. Negative for congestion.        Cough while eating.   Eyes:  Negative for visual  disturbance.  Respiratory:  Negative for cough, shortness of breath and wheezing.   Cardiovascular:  Positive for leg swelling. Negative for chest pain and palpitations.  Gastrointestinal:  Negative for abdominal pain and constipation.  Genitourinary:  Positive for frequency. Negative for dysuria and urgency.  Musculoskeletal:  Positive for gait problem.  Skin:  Positive for wound. Negative for color change.  The right buttock  Neurological:  Negative for speech difficulty, weakness and headaches.  Psychiatric/Behavioral:  Negative for behavioral problems and sleep disturbance.    Immunization History  Administered Date(s) Administered  . Moderna Sars-Covid-2 Vaccination 07/06/2019, 08/02/2019, 05/06/2020, 11/25/2020  . PPD Test 07/21/2015, 08/04/2015  . Tdap 07/16/2015  . Zoster Recombinat (Shingrix) 12/04/2013   Pertinent  Health Maintenance Due  Topic Date Due  . INFLUENZA VACCINE  Discontinued  . PNA vac Low Risk Adult  Discontinued   Fall Risk  09/12/2020 08/05/2020 07/04/2020 03/06/2020 08/17/2019  Falls in the past year? 0 0 0 0 0  Comment - - - - -  Number falls in past yr: 0 0 0 0 0  Comment - - - - -  Injury with Fall? - 0 - - -  Comment - - - - -   Functional Status Survey:    Vitals:   12/05/20 1102  BP: 129/67  Pulse: 68  Resp: 18  Temp: 98.1 F (36.7 C)  SpO2: 91%  Weight: 120 lb (54.4 kg)  Height: 6\' 1"  (1.854 m)   Body mass index is 15.83 kg/m. Physical Exam Vitals and nursing note reviewed.  Constitutional:      Appearance: Normal appearance.  HENT:     Head: Normocephalic and atraumatic.     Mouth/Throat:     Mouth: Mucous membranes are moist.  Eyes:     Extraocular Movements: Extraocular movements intact.     Conjunctiva/sclera: Conjunctivae normal.     Pupils: Pupils are equal, round, and reactive to light.  Cardiovascular:     Rate and Rhythm: Normal rate. Rhythm irregular.     Heart sounds: No murmur heard. Pulmonary:     Breath  sounds: No rales.  Abdominal:     General: Bowel sounds are normal.     Palpations: Abdomen is soft.     Tenderness: There is no abdominal tenderness.     Comments: Irregular schedule BM, but no constipation.   Musculoskeletal:     Cervical back: Normal range of motion and neck supple.     Right lower leg: No edema.     Left lower leg: Edema present.     Comments: Trace edema LLE  Skin:    General: Skin is warm and dry.     Comments: Chronic venous insufficiency skin changes. R buttock open area about a dime sized with remaining over growth scar tissue at the edge, healthy tissue at wound bed, no odorous drainage or s/s of infection.   Neurological:     General: No focal deficit present.     Mental Status: He is alert and oriented to person, place, and time. Mental status is at baseline.     Gait: Gait abnormal.  Psychiatric:        Mood and Affect: Mood normal.        Behavior: Behavior normal.        Thought Content: Thought content normal.        Judgment: Judgment normal.    Labs reviewed: Recent Labs    03/10/20 0946 05/06/20 0700 10/10/20 0019 11/27/20 0000  NA 139 141 142 140  K 4.1 4.1 4.2 3.8  CL 99 100 99 101  CO2 31 35* 33* 35*  GLUCOSE 83 82  --   --   BUN 37* 38* 39* 39*  CREATININE 1.16* 1.03 1.1 1.0  CALCIUM 9.1 9.6 9.6 9.5   Recent Labs    03/10/20 0946 05/06/20 0700 10/10/20  0019 11/27/20 0000  AST 17 17 19  12*  ALT 17 14 11  7*  ALKPHOS  --   --  62 55  BILITOT 0.9 0.8  --   --   PROT 6.3 6.7  --   --   ALBUMIN  --   --  3.9 3.4*   Recent Labs    03/10/20 0946 05/06/20 0700 10/10/20 0019  WBC 3.9 3.4* 5.0  NEUTROABS 1,880 1,584 2,830.00  HGB 13.9 13.5 13.0*  HCT 41.7 40.9 40*  MCV 93.9 95.1  --   PLT 189 205 312   Lab Results  Component Value Date   TSH 4.30 10/10/2020   No results found for: HGBA1C Lab Results  Component Value Date   CHOL 147 10/10/2020   HDL 53 10/10/2020   LDLCALC 81 10/10/2020   TRIG 51 10/10/2020    CHOLHDL 2.6 07/10/2019    Significant Diagnostic Results in last 30 days:  No results found.  Assessment/Plan Pressure ulcer of right buttock, stage 2 (HCC) ight buttock pressure ulcer developed from previous deep tissue injury with overgrowth scar tissue area, opened, fresh tissue at wound bed, no odorous drainage, no s/s of infection. Air mattress overlay, gel cushion in w/c, the patient is aware of frequent repositioning for pressure reduction. Will apply Hydrocolloid dressing q 3 days and prn until healed.   Edema Chronic edema BLE, mostly LLE, improved, the patient admitted he sleeps on his left side most of time. Furosemide 10mg   since 11/19/20, negative [place], dc HCTZ 12.5mg  qd  Paroxysmal atrial fibrillation (HCC) heart rate is in 50s, on Digoxin.    Acquired hypothyroidism stable, on Levothyroxine 50mcg qd. TSH 4.3 10/10/20  BPH (benign prostatic hyperplasia) stable, on Dutasteride 0.5mg  qd  CKD (chronic kidney disease) stage 3, GFR 30-59 ml/min (HCC) Bun/cret 39/1.0 11/27/20  GERD (gastroesophageal reflux disease) takes Pantoprazole, Hgb 13.0 10/10/20  Hyperlipidemia LDL goal <70 takes Pravastatin, LDL 81 11/27/20  Angina pectoris ([place]) No chest pain since last seen, takes Plavix, Isosorbide      Family/ staff Communication: plan of care reviewed with the patient and charge nurse.   Labs/tests ordered:  none  Time spend 40 minutes.

## 2020-12-05 NOTE — Assessment & Plan Note (Signed)
heart rate is in 50s, on Digoxin.

## 2020-12-05 NOTE — Assessment & Plan Note (Signed)
Chronic edema BLE, mostly LLE, improved, the patient admitted he sleeps on his left side most of time. Furosemide 10mg   since 11/19/20, negative [place], dc HCTZ 12.5mg  qd

## 2020-12-05 NOTE — Assessment & Plan Note (Signed)
No chest pain since last seen, takes Plavix, Isosorbide

## 2020-12-05 NOTE — Assessment & Plan Note (Signed)
Bun/cret 39/1.0 11/27/20

## 2020-12-05 NOTE — Assessment & Plan Note (Signed)
ight buttock pressure ulcer developed from previous deep tissue injury with overgrowth scar tissue area, opened, fresh tissue at wound bed, no odorous drainage, no s/s of infection. Air mattress overlay, gel cushion in w/c, the patient is aware of frequent repositioning for pressure reduction. Will apply Hydrocolloid dressing q 3 days and prn until healed.

## 2020-12-05 NOTE — Assessment & Plan Note (Signed)
takes Pantoprazole, Hgb 13.0 10/10/20

## 2020-12-05 NOTE — Assessment & Plan Note (Signed)
stable, on Dutasteride 0.5mg qd 

## 2020-12-05 NOTE — Assessment & Plan Note (Signed)
stable, on Levothyroxine 28mcg qd. TSH 4.3 10/10/20

## 2020-12-05 NOTE — Assessment & Plan Note (Signed)
takes Pravastatin, LDL 81 11/27/20

## 2020-12-18 DIAGNOSIS — I10 Essential (primary) hypertension: Secondary | ICD-10-CM | POA: Diagnosis not present

## 2020-12-18 DIAGNOSIS — I480 Paroxysmal atrial fibrillation: Secondary | ICD-10-CM | POA: Diagnosis not present

## 2020-12-18 DIAGNOSIS — Z5181 Encounter for therapeutic drug level monitoring: Secondary | ICD-10-CM | POA: Diagnosis not present

## 2020-12-18 DIAGNOSIS — R609 Edema, unspecified: Secondary | ICD-10-CM | POA: Diagnosis not present

## 2020-12-22 ENCOUNTER — Encounter: Payer: Self-pay | Admitting: Nurse Practitioner

## 2020-12-22 ENCOUNTER — Non-Acute Institutional Stay: Payer: Medicare Other | Admitting: Nurse Practitioner

## 2020-12-22 DIAGNOSIS — E785 Hyperlipidemia, unspecified: Secondary | ICD-10-CM

## 2020-12-22 DIAGNOSIS — I739 Peripheral vascular disease, unspecified: Secondary | ICD-10-CM

## 2020-12-22 DIAGNOSIS — E039 Hypothyroidism, unspecified: Secondary | ICD-10-CM | POA: Diagnosis not present

## 2020-12-22 DIAGNOSIS — I480 Paroxysmal atrial fibrillation: Secondary | ICD-10-CM

## 2020-12-22 DIAGNOSIS — N400 Enlarged prostate without lower urinary tract symptoms: Secondary | ICD-10-CM | POA: Diagnosis not present

## 2020-12-22 DIAGNOSIS — I209 Angina pectoris, unspecified: Secondary | ICD-10-CM

## 2020-12-22 DIAGNOSIS — L89312 Pressure ulcer of right buttock, stage 2: Secondary | ICD-10-CM

## 2020-12-22 DIAGNOSIS — K219 Gastro-esophageal reflux disease without esophagitis: Secondary | ICD-10-CM

## 2020-12-22 DIAGNOSIS — N1831 Chronic kidney disease, stage 3a: Secondary | ICD-10-CM | POA: Diagnosis not present

## 2020-12-22 NOTE — Assessment & Plan Note (Signed)
takes Pantoprazole, Hgb 13.0 10/10/20

## 2020-12-22 NOTE — Progress Notes (Signed)
Location:   [facility] Room Number: [id number] Place of Service:  ALF [phone]) Provider:  Ledonna Dormer Otho Darner, NP    Patient Care Team: Wardell Honour, MD as PCP - General (Family Medicine) Danella Sensing, MD as Consulting Physician (Dermatology) Lavelle Berland X, NP as Nurse Practitioner (Internal Medicine)  Extended Emergency Contact Information Primary Emergency Contact: Mortimer,Bill Address: [address]          [place], [place] [address] Johnnette Litter of [place] Phone: [phone] Mobile Phone: [phone] Relation: Brother Secondary Emergency Contact: [place] of [place] Mobile Phone: [phone] Relation: Relative  Code Status:  DNR Goals of care: Advanced Directive information Advanced Directives 12/22/2020  Does Patient Have a Medical Advance Directive? Yes  Type of Paramedic of [place];Out of facility DNR (pink MOST or yellow form)  Does patient want to make changes to medical advance directive? No - Patient declined  Copy of [place] in Chart? Yes - validated most recent copy scanned in chart (See row information)  Pre-existing out of facility DNR order (yellow form or pink MOST form) Yellow form placed in chart (order not valid for inpatient use)     Chief Complaint  Patient presents with   Medical Management of Chronic Issues    Routine follow up    Health Maintenance    Discuss need for shingles vaccine.     HPI:  Pt is a 87 y.o. male seen today for medical management of chronic diseases.     The right buttock pressure ulcer  is healed, the patient desires removing Air mattress overlay.    Chronic edema BLE, mostly LLE, improved, the patient admitted he sleeps on his left side most of time. Furosemide 10mg   since 11/19/20, negative [place]             Hx of Afib, heart rate is in 50s, on Digoxin. Dig level 1.0 12/18/20             Hypothyroidism, stable, on Levothyroxine 8mcg qd. 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   Cervical spondylosis without myelopathy 08/22/2015   Diverticulosis    Diverticulosis of colon without hemorrhage 08/22/2015   Edema 08/18/2015   Fall 07/17/2015   Gait disturbance 08/22/2015   GERD (gastroesophageal reflux disease)    occurs rarely   History of CVA (cerebrovascular accident) 07/17/2015   HLD (hyperlipidemia) 07/17/2015   Hyperlipidemia    Hypertension    Inguinal hernia    Left carotid bruit    Melanoma ([place])    left forearm   Osteopenia    Paroxysmal atrial fibrillation (HCC)    Physical deconditioning 08/22/2015   Retinal hemorrhage  04/24/2014   Rosacea blepharoconjunctivitis 11/20/2011   Stroke ([place]) 3/07   affected balance   Tubular adenoma of colon 2007   Weakness 07/17/2015   Past Surgical History:  Procedure Laterality Date   COLONOSCOPY  2008   EYE SURGERY Right    cataract extraction with IOL   INGUINAL HERNIA REPAIR Right 03/15/2013   Procedure: HERNIA REPAIR INGUINAL ADULT;  Surgeon: Odis Hollingshead, MD;  Location: [facility];  Service: General;  Laterality: Right;   INSERTION OF MESH Right 03/15/2013   Procedure: INSERTION OF MESH;  Surgeon: Odis Hollingshead, MD;  Location: [facility];  Service: General;  Laterality: Right;   left forearm melanoma     PROSTATE BIOPSY     SKIN SURGERY  08/04/2020   TONSILLECTOMY  1933    Allergies  Allergen Reactions   Aspirin    Eggs Or Egg-Derived Products Swelling     throat    Allergies as of 12/22/2020       Reactions   Aspirin    Eggs Or Egg-derived Products Swelling   throat        Medication List        Accurate as of December 22, 2020  1:49 PM. If you have any questions, ask your nurse or doctor.          STOP taking these medications    hydrochlorothiazide 12.5 MG capsule Commonly known as: MICROZIDE Stopped by: Anastasia Tompson X Joshue Badal, NP       TAKE these medications    cholecalciferol 10 MCG (400 UNIT) Tabs tablet Commonly known as: VITAMIN D3 Take 400 Units by mouth 2 (two) times daily.   clopidogrel 75 MG tablet Commonly known as: PLAVIX Take 75 mg by mouth daily. Start PLAVIX from 07/24/15 if no further hematuria is reported by patient and HB > 12.   digoxin 0.125 MG tablet Commonly known as: LANOXIN Take 0.125 mg by mouth daily.   dutasteride 0.5 MG capsule Commonly known as: AVODART Take 0.5 mg by mouth daily.   furosemide 20 MG tablet Commonly known as: LASIX Take 10 mg by mouth daily.   isosorbide mononitrate 30 MG 24 hr tablet Commonly known as: IMDUR Take 0.5 tablets (15 mg total) by mouth daily.   levothyroxine 50 MCG tablet Commonly known as: SYNTHROID Take 50 mcg by mouth daily before breakfast.   nitroGLYCERIN 0.4 MG SL tablet Commonly known as: NITROSTAT Place 1 tablet under the tongue as needed.   pantoprazole 40 MG tablet Commonly known as: PROTONIX Take 1 tablet (40 mg total) by mouth daily.   POTASSIUM CHLORIDE ER PO Take 1 capsule by mouth every other day.   pravastatin 20 MG tablet Commonly known as: PRAVACHOL Take 1 tablet by mouth at bedtime.        Review of Systems  Constitutional:  Negative for fatigue, fever and unexpected weight change.  HENT:  Positive for hearing loss and trouble swallowing. Negative for congestion.        Cough while eating.   Eyes:  Negative for visual disturbance.  Respiratory:  Negative for cough and shortness of breath.   Cardiovascular:  Positive for  leg swelling. Negative for chest pain and palpitations.  Gastrointestinal:  Negative for abdominal pain and constipation.  Genitourinary:  Positive for frequency. Negative for dysuria and urgency.  Musculoskeletal:  Positive for gait problem.  Skin:  Negative for color change.  Neurological:  Negative for speech difficulty, weakness and headaches.  Psychiatric/Behavioral:  Negative for behavioral  problems and sleep disturbance. The patient is not nervous/anxious.    Immunization History  Administered Date(s) Administered   Moderna Sars-Covid-2 Vaccination 07/06/2019, 08/02/2019, 05/06/2020, 11/25/2020   PPD Test 07/21/2015, 08/04/2015   Tdap 07/16/2015   Zoster Recombinat (Shingrix) 12/04/2013   Pertinent  Health Maintenance Due  Topic Date Due   INFLUENZA VACCINE  Discontinued   PNA vac Low Risk Adult  Discontinued   Fall Risk  09/12/2020 08/05/2020 07/04/2020 03/06/2020 08/17/2019  Falls in the past year? 0 0 0 0 0  Comment - - - - -  Number falls in past yr: 0 0 0 0 0  Comment - - - - -  Injury with Fall? - 0 - - -  Comment - - - - -   Functional Status Survey:    Vitals:   12/22/20 0948  BP: 132/74  Pulse: 68  Resp: 18  Temp: 98.1 F (36.7 C)  SpO2: 95%  Weight: 123 lb 3.2 oz (55.9 kg)  Height: 6\' 1"  (1.854 m)   Body mass index is 16.25 kg/m. Physical Exam Vitals and nursing note reviewed.  Constitutional:      Appearance: Normal appearance.  HENT:     Head: Normocephalic and atraumatic.     Mouth/Throat:     Mouth: Mucous membranes are moist.  Eyes:     Extraocular Movements: Extraocular movements intact.     Conjunctiva/sclera: Conjunctivae normal.     Pupils: Pupils are equal, round, and reactive to light.  Cardiovascular:     Rate and Rhythm: Normal rate. Rhythm irregular.     Heart sounds: No murmur heard. Pulmonary:     Breath sounds: No rales.  Abdominal:     Palpations: Abdomen is soft.     Tenderness: There is no abdominal tenderness.     Comments:  Irregular schedule BM, but no constipation.   Musculoskeletal:     Cervical back: Normal range of motion and neck supple.     Right lower leg: No edema.     Left lower leg: Edema present.     Comments: Trace edema LLE  Skin:    General: Skin is warm and dry.     Comments: Chronic venous insufficiency skin changes.   Neurological:     General: No focal deficit present.     Mental Status: He is alert and oriented to person, place, and time. Mental status is at baseline.     Gait: Gait abnormal.  Psychiatric:        Mood and Affect: Mood normal.        Behavior: Behavior normal.        Thought Content: Thought content normal.        Judgment: Judgment normal.    Labs reviewed: Recent Labs    03/10/20 0946 05/06/20 0700 10/10/20 0019 11/27/20 0000  NA 139 141 142 140  K 4.1 4.1 4.2 3.8  CL 99 100 99 101  CO2 31 35* 33* 35*  GLUCOSE 83 82  --   --   BUN 37* 38* 39* 39*  CREATININE 1.16* 1.03 1.1 1.0  CALCIUM 9.1 9.6 9.6 9.5   Recent Labs    03/10/20 0946 05/06/20 0700 10/10/20 0019 11/27/20 0000  AST 17 17 19  12*  ALT 17 14 11  7*  ALKPHOS  --   --  62 55  BILITOT 0.9 0.8  --   --   PROT 6.3 6.7  --   --   ALBUMIN  --   --  3.9 3.4*   Recent Labs    03/10/20 0946 05/06/20 0700 10/10/20 0019  WBC 3.9 3.4* 5.0  NEUTROABS 1,880 1,584 2,830.00  HGB 13.9 13.5 13.0*  HCT 41.7 40.9 40*  MCV 93.9 95.1  --   PLT 189 205 312   Lab Results  Component Value Date   TSH 4.30 10/10/2020   No results found for: HGBA1C Lab Results  Component Value Date   CHOL 147 10/10/2020   HDL 53 10/10/2020   LDLCALC 81 10/10/2020   TRIG 51 10/10/2020   CHOLHDL 2.6 07/10/2019    Significant Diagnostic Results in last 30 days:  No results found.  Assessment/Plan PVD (peripheral vascular disease) (HCC) chronic edema BLE, mostly LLE, improved, the patient admitted he sleeps on his left side most of time. Furosemide 10mg   since 11/19/20, negative [place]               Paroxysmal  atrial fibrillation (HCC) heart rate is in 50s, on Digoxin. Dig level 1.0 12/18/20  Acquired hypothyroidism stable, on Levothyroxine 9mcg qd. TSH 4.3 10/10/20  BPH (benign prostatic hyperplasia) stable, on Dutasteride 0.5mg  qd  Pressure ulcer of right buttock, stage 2 (HCC) Healed.   CKD (chronic kidney disease) stage 3, GFR 30-59 ml/min (HCC) CKD Bun/cret 39/1.0 11/27/20  GERD (gastroesophageal reflux disease)  takes Pantoprazole, Hgb 13.0 10/10/20  Hyperlipidemia LDL goal <70 takes Pravastatin, LDL 81 11/27/20  Angina pectoris ([place]) CAD/angina pectoris, takes Plavix, Isosorbide    Family/ staff Communication: plan of care reviewed with the patient and charge nurse.   Labs/tests ordered:  none  Time spend 40 minutes.

## 2020-12-22 NOTE — Assessment & Plan Note (Signed)
CKD Bun/cret 39/1.0 11/27/20

## 2020-12-22 NOTE — Assessment & Plan Note (Signed)
heart rate is in 50s, on Digoxin. Dig level 1.0 12/18/20

## 2020-12-22 NOTE — Assessment & Plan Note (Signed)
chronic edema BLE, mostly LLE, improved, the patient admitted he sleeps on his left side most of time. Furosemide 10mg   since 11/19/20, negative [place]

## 2020-12-22 NOTE — Assessment & Plan Note (Signed)
takes Pravastatin, LDL 81 11/27/20

## 2020-12-22 NOTE — Assessment & Plan Note (Signed)
Healed

## 2020-12-22 NOTE — Assessment & Plan Note (Signed)
stable, on Levothyroxine 21mcg qd. TSH 4.3 10/10/20

## 2020-12-22 NOTE — Assessment & Plan Note (Signed)
stable, on Dutasteride 0.5mg qd 

## 2020-12-22 NOTE — Assessment & Plan Note (Signed)
CAD/angina pectoris, takes Plavix, Isosorbide  

## 2021-03-17 DIAGNOSIS — Z23 Encounter for immunization: Secondary | ICD-10-CM | POA: Diagnosis not present

## 2021-03-18 ENCOUNTER — Non-Acute Institutional Stay: Payer: Medicare Other | Admitting: Orthopedic Surgery

## 2021-03-18 DIAGNOSIS — N400 Enlarged prostate without lower urinary tract symptoms: Secondary | ICD-10-CM

## 2021-03-18 DIAGNOSIS — R34 Anuria and oliguria: Secondary | ICD-10-CM | POA: Diagnosis not present

## 2021-03-19 ENCOUNTER — Encounter: Payer: Self-pay | Admitting: Orthopedic Surgery

## 2021-03-19 DIAGNOSIS — I10 Essential (primary) hypertension: Secondary | ICD-10-CM | POA: Diagnosis not present

## 2021-03-19 NOTE — Progress Notes (Signed)
Location:  [facility] Room Number: [place] of Service:  ALF [phone]) Provider:  Windell Moulding, AGNP-C  Wardell Honour, MD  Patient Care Team: Wardell Honour, MD as PCP - General (Family Medicine) Danella Sensing, MD as Consulting Physician (Dermatology) Mast, Man X, NP as Nurse Practitioner (Internal Medicine)  Extended Emergency Contact Information Primary Emergency Contact: Brauer,Bill Address: [address]          [place], [place] [address] Johnnette Litter of [place] Phone: [phone] Mobile Phone: [phone] Relation: Brother Secondary Emergency Contact: [place] Mobile Phone: [phone] Relation: Relative  Code Status:  DNR Goals of care: Advanced Directive information Advanced Directives 12/22/2020  Does Patient Have a Medical Advance Directive? Yes  Type of Paramedic of [place];Out of facility DNR (pink MOST or yellow form)  Does patient want to make changes to medical advance directive? No - Patient declined  Copy of [place] in Chart? Yes - validated most recent copy scanned in chart (See row information)  Pre-existing out of facility DNR order (yellow form or pink MOST form) Yellow form placed in chart (order not valid for inpatient use)     Chief Complaint  Patient presents with   Acute Visit    Decreased urine output    HPI:  Pt is a 87 y.o. male seen today for acute visit due to low urine output.   Patient c/o decreased UOP overnight. He has a history of BPH and urinates about every 2 hours, even at night.Takes dutasteride daily.  Last night he did not urinate once. Denies incontinent episodes. He states" I am worried I am in kidney failure." Nursing staff reports he does not drink fluids well. His water bottle was 75% full from this morning. Denies flank pain, fever, bladder distension/pain. We discussed importance of staying hydrated and drinking  water.    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   Cervical spondylosis without myelopathy 08/22/2015   Diverticulosis    Diverticulosis of colon without hemorrhage 08/22/2015   Edema 08/18/2015   Fall 07/17/2015   Gait disturbance 08/22/2015   GERD (gastroesophageal reflux disease)    occurs rarely   History of CVA (cerebrovascular accident) 07/17/2015   HLD (hyperlipidemia) 07/17/2015   Hyperlipidemia    Hypertension    Inguinal hernia    Left carotid bruit    Melanoma ([place])    left forearm   Osteopenia    Paroxysmal atrial fibrillation ([place])    Physical deconditioning 08/22/2015   Retinal hemorrhage 04/24/2014   Rosacea blepharoconjunctivitis 11/20/2011   Stroke ([place]) 3/07   affected balance   Tubular adenoma of colon 2007   Weakness 07/17/2015   Past Surgical History:  Procedure Laterality Date   COLONOSCOPY  2008   EYE SURGERY Right    cataract extraction with IOL   INGUINAL HERNIA REPAIR Right 03/15/2013   Procedure: HERNIA REPAIR INGUINAL ADULT;  Surgeon: Odis Hollingshead, MD;  [facility]: [facility];  Service: General;  Laterality: Right;   INSERTION OF MESH Right 03/15/2013   Procedure: INSERTION OF MESH;  Surgeon: Odis Hollingshead, MD;  Location: [facility];  Service: General;  Laterality: Right;   left forearm melanoma  PROSTATE BIOPSY     SKIN SURGERY  08/04/2020   TONSILLECTOMY  1933    Allergies  Allergen Reactions   Aspirin    Eggs Or Egg-Derived Products Swelling    throat    Outpatient Encounter Medications as of 03/18/2021  Medication Sig   cholecalciferol (VITAMIN D) 400 units TABS tablet Take 400 Units by mouth 2 (two) times daily.   clopidogrel (PLAVIX) 75 MG tablet Take 75 mg by mouth daily. Start PLAVIX from 07/24/15 if no further  hematuria is reported by patient and HB > 12.   digoxin (LANOXIN) 0.125 MG tablet Take 0.125 mg by mouth daily.   dutasteride (AVODART) 0.5 MG capsule Take 0.5 mg by mouth daily.    furosemide (LASIX) 20 MG tablet Take 10 mg by mouth daily.   isosorbide mononitrate (IMDUR) 30 MG 24 hr tablet Take 0.5 tablets (15 mg total) by mouth daily.   levothyroxine (SYNTHROID, LEVOTHROID) 50 MCG tablet Take 50 mcg by mouth daily before breakfast.   nitroGLYCERIN (NITROSTAT) 0.4 MG SL tablet Place 1 tablet under the tongue as needed.   pantoprazole (PROTONIX) 40 MG tablet Take 1 tablet (40 mg total) by mouth daily.   POTASSIUM CHLORIDE ER PO Take 1 capsule by mouth every other day.   pravastatin (PRAVACHOL) 20 MG tablet Take 1 tablet by mouth at bedtime.   No facility-administered encounter medications on file as of 03/18/2021.    Review of Systems  Constitutional:  Negative for activity change, appetite change, chills, fatigue and fever.  Respiratory:  Negative for cough, shortness of breath and wheezing.   Cardiovascular:  Negative for chest pain and leg swelling.  Gastrointestinal:  Negative for abdominal distention, abdominal pain, constipation, diarrhea and nausea.  Genitourinary:  Positive for frequency. Negative for dysuria, hematuria and urgency.  Musculoskeletal:  Positive for gait problem. Negative for back pain.  Psychiatric/Behavioral:  Negative for dysphoric mood. The patient is not nervous/anxious.    Immunization History  Administered Date(s) Administered   Moderna Sars-Covid-2 Vaccination 07/06/2019, 08/02/2019, 05/06/2020, 11/25/2020   PPD Test 07/21/2015, 08/04/2015   Tdap 07/16/2015   Zoster Recombinat (Shingrix) 12/04/2013   Pertinent  Health Maintenance Due  Topic Date Due   INFLUENZA VACCINE  Discontinued   Fall Risk  09/12/2020 08/05/2020 07/04/2020 03/06/2020 08/17/2019  Falls in the past year? 0 0 0 0 0  Comment - - - - -  Number falls in past yr: 0 0 0 0 0  Comment - - - - -   Injury with Fall? - 0 - - -  Comment - - - - -   Functional Status Survey:    Vitals:   03/19/21 1541  BP: 136/78  Pulse: 80  Resp: 16  Temp: 99.8 F (37.7 C)  SpO2: 95%  Weight: 124 lb 12.8 oz (56.6 kg)  Height: 6\' 1"  (1.854 m)   Body mass index is 16.47 kg/m. Physical Exam Vitals reviewed.  Constitutional:      General: He is not in acute distress. HENT:     Head: Normocephalic.     Mouth/Throat:     Mouth: Mucous membranes are moist.  Cardiovascular:     Rate and Rhythm: Normal rate. Rhythm irregular.     Pulses: Normal pulses.     Heart sounds: Normal heart sounds. No murmur heard. Pulmonary:     Effort: Pulmonary effort is normal. No respiratory distress.     Breath sounds: Normal breath sounds. No wheezing.  Abdominal:  General: Bowel sounds are normal. There is no distension.     Palpations: Abdomen is soft.     Tenderness: There is no abdominal tenderness.  Musculoskeletal:     Right lower leg: No edema.     Left lower leg: No edema.  Skin:    General: Skin is warm and dry.     Capillary Refill: Capillary refill takes less than 2 seconds.  Neurological:     General: No focal deficit present.     Mental Status: He is alert and oriented to person, place, and time.     Motor: Weakness present.     Gait: Gait abnormal.  Psychiatric:        Mood and Affect: Mood normal.        Behavior: Behavior normal.    Labs reviewed: Recent Labs    05/06/20 0700 10/10/20 0019 11/27/20 0000  NA 141 142 140  K 4.1 4.2 3.8  CL 100 99 101  CO2 35* 33* 35*  GLUCOSE 82  --   --   BUN 38* 39* 39*  CREATININE 1.03 1.1 1.0  CALCIUM 9.6 9.6 9.5   Recent Labs    05/06/20 0700 10/10/20 0019 11/27/20 0000  AST 17 19 12*  ALT 14 11 7*  ALKPHOS  --  62 55  BILITOT 0.8  --   --   PROT 6.7  --   --   ALBUMIN  --  3.9 3.4*   Recent Labs    05/06/20 0700 10/10/20 0019  WBC 3.4* 5.0  NEUTROABS 1,584 2,830.00  HGB 13.5 13.0*  HCT 40.9 40*  MCV 95.1  --    PLT 205 312   Lab Results  Component Value Date   TSH 4.30 10/10/2020   No results found for: HGBA1C Lab Results  Component Value Date   CHOL 147 10/10/2020   HDL 53 10/10/2020   LDLCALC 81 10/10/2020   TRIG 51 10/10/2020   CHOLHDL 2.6 07/10/2019    Significant Diagnostic Results in last 30 days:  No results found.  Assessment/Plan: 1. Decreased urine output - reports not voiding overnight - suspect due to poor po intake - encouraged to drink more water - I/O x 1 day- 09/22 240 cc in/ 120 cc out (AM record) - bmp  2. Benign prostatic hyperplasia without lower urinary tract symptoms - cont dutasteride    Family/ staff Communication: plan discussed with patient and nurse  Labs/tests ordered:  Strict I/O x 24 hrs, bmp

## 2021-03-20 LAB — BASIC METABOLIC PANEL
BUN: 37 — AB (ref 4–21)
CO2: 33 — AB (ref 13–22)
Chloride: 100 (ref 99–108)
Creatinine: 1.2 (ref 0.6–1.3)
Glucose: 83
Potassium: 4 (ref 3.4–5.3)
Sodium: 140 (ref 137–147)

## 2021-03-20 LAB — HEPATIC FUNCTION PANEL
ALT: 8 — AB (ref 10–40)
AST: 13 — AB (ref 14–40)
Alkaline Phosphatase: 65 (ref 25–125)
Bilirubin, Total: 0.7

## 2021-03-20 LAB — COMPREHENSIVE METABOLIC PANEL
Albumin: 3.8 (ref 3.5–5.0)
Calcium: 9.5 (ref 8.7–10.7)
Globulin: 2.6

## 2021-05-26 ENCOUNTER — Non-Acute Institutional Stay: Payer: Medicare Other | Admitting: Internal Medicine

## 2021-05-26 ENCOUNTER — Encounter: Payer: Self-pay | Admitting: Internal Medicine

## 2021-05-26 DIAGNOSIS — N400 Enlarged prostate without lower urinary tract symptoms: Secondary | ICD-10-CM | POA: Diagnosis not present

## 2021-05-26 DIAGNOSIS — I739 Peripheral vascular disease, unspecified: Secondary | ICD-10-CM | POA: Diagnosis not present

## 2021-05-26 DIAGNOSIS — E039 Hypothyroidism, unspecified: Secondary | ICD-10-CM | POA: Diagnosis not present

## 2021-05-26 DIAGNOSIS — I480 Paroxysmal atrial fibrillation: Secondary | ICD-10-CM | POA: Diagnosis not present

## 2021-05-26 DIAGNOSIS — R2681 Unsteadiness on feet: Secondary | ICD-10-CM

## 2021-05-26 DIAGNOSIS — E785 Hyperlipidemia, unspecified: Secondary | ICD-10-CM | POA: Diagnosis not present

## 2021-05-26 DIAGNOSIS — R600 Localized edema: Secondary | ICD-10-CM

## 2021-05-26 DIAGNOSIS — R1312 Dysphagia, oropharyngeal phase: Secondary | ICD-10-CM

## 2021-05-26 DIAGNOSIS — N1831 Chronic kidney disease, stage 3a: Secondary | ICD-10-CM | POA: Diagnosis not present

## 2021-05-26 NOTE — Progress Notes (Signed)
Location:   [facility] Room Number: [place] of Service:  ALF [phone]) Provider:  Veleta Miners MD  Virgie Dad, MD  Patient Care Team: Virgie Dad, MD as PCP - General (Internal Medicine) Danella Sensing, MD as Consulting Physician (Dermatology) Mast, Man X, NP as Nurse Practitioner (Internal Medicine)  Extended Emergency Contact Information Primary Emergency Contact: Yablonski,Bill Address: [address]          [place], [place] [address] Johnnette Litter of [place] Phone: [phone] Mobile Phone: [phone] Relation: Brother Secondary Emergency Contact: [place] of [place] Mobile Phone: [phone] Relation: Relative  Code Status:  DNR Goals of care: Advanced Directive information Advanced Directives 05/26/2021  Does Patient Have a Medical Advance Directive? Yes  Type of Advance Directive Out of facility DNR (pink MOST or yellow form);Healthcare Power of Attorney  Does patient want to make changes to medical advance directive? No - Patient declined  Copy of [place] in Chart? Yes - validated most recent copy scanned in chart (See row information)  Pre-existing out of facility DNR order (yellow form or pink MOST form) Yellow form placed in chart (order not valid for inpatient use)     Chief Complaint  Patient presents with   Medical Management of Chronic Issues   Quality Metric Gaps    Pneumonia Vaccine 56+ Years old (1 - PCV)  Zoster Vaccines- Shingrix (2 of 2) COVID-19 Vaccine       HPI:  Pt is a 87 y.o. male seen today for medical management of chronic diseases.   He has h/o PAF, Hypertension, Hyperlipidemia, BPH, H/o CVA   Weight loss and Possible Oropharyngeal Dysphagia   [place] He decided to finally move as he was getting weaker and unable to take care of himself.   His active problems Oropharyngeal dysphagia Refuses to see Speech any more. 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   Cervical spondylosis without myelopathy 08/22/2015   Diverticulosis    Diverticulosis of colon without hemorrhage 08/22/2015   Edema 08/18/2015   Fall 07/17/2015   Gait disturbance 08/22/2015   GERD (gastroesophageal reflux disease)    occurs rarely   History of CVA (cerebrovascular accident) 07/17/2015   HLD (hyperlipidemia) 07/17/2015   Hyperlipidemia    Hypertension    Inguinal hernia    Left carotid bruit    Melanoma ([place])    left forearm   Osteopenia    Paroxysmal atrial fibrillation ([place])    Physical deconditioning 08/22/2015   Retinal hemorrhage 04/24/2014   Rosacea blepharoconjunctivitis 11/20/2011   Stroke ([place]) 3/07   affected balance   Tubular adenoma of colon 2007   Weakness 07/17/2015   Past Surgical History:  Procedure Laterality Date   COLONOSCOPY  2008   EYE SURGERY Right    cataract extraction with IOL   INGUINAL HERNIA REPAIR Right 03/15/2013   Procedure: HERNIA  REPAIR INGUINAL ADULT;  Surgeon: Odis Hollingshead, MD;  Location: [facility];  Service: General;  Laterality: Right;   INSERTION OF MESH Right 03/15/2013   Procedure: INSERTION OF MESH;  Surgeon: Odis Hollingshead, MD;  Location: [facility];  Service: General;  Laterality: Right;   left forearm melanoma     PROSTATE BIOPSY     SKIN SURGERY  08/04/2020   TONSILLECTOMY  1933    Allergies  Allergen Reactions   Aspirin    Eggs Or Egg-Derived Products Swelling    throat    Allergies as of 05/26/2021        Reactions   Aspirin    Eggs Or Egg-derived Products Swelling   throat        Medication List        Accurate as of May 26, 2021 11:48 AM. If you have any questions, ask your nurse or doctor.          cholecalciferol 10 MCG (400 UNIT) Tabs tablet Commonly known as: VITAMIN D3 Take 400 Units by mouth 2 (two) times daily.   clopidogrel 75 MG tablet Commonly known as: PLAVIX Take 75 mg by mouth daily. Start PLAVIX from 07/24/15 if no further hematuria is reported by patient and HB > 12.   digoxin 0.125 MG tablet Commonly known as: LANOXIN Take 0.125 mg by mouth daily.   dutasteride 0.5 MG capsule Commonly known as: AVODART Take 0.5 mg by mouth daily.   furosemide 20 MG tablet Commonly known as: LASIX Take 10 mg by mouth daily.   isosorbide mononitrate 30 MG 24 hr tablet Commonly known as: IMDUR Take 0.5 tablets (15 mg total) by mouth daily.   levothyroxine 50 MCG tablet Commonly known as: SYNTHROID Take 50 mcg by mouth daily before breakfast.   nitroGLYCERIN 0.4 MG SL tablet Commonly known as: NITROSTAT Place 1 tablet under the tongue as needed.   pantoprazole 40 MG tablet Commonly known as: PROTONIX Take 1 tablet (40 mg total) by mouth daily.   polyethylene glycol 17 g packet Commonly known as: MIRALAX / GLYCOLAX Take 17 g by mouth daily as needed.   POTASSIUM CHLORIDE ER PO Take 1 capsule by mouth every other day.   pravastatin 20 MG tablet Commonly known as: PRAVACHOL Take 1 tablet by mouth at bedtime.        Review of Systems  Constitutional:  Positive for activity change and appetite change.  HENT: Negative.    Respiratory:  Positive for cough.   Cardiovascular:  Positive for leg swelling.  Gastrointestinal:  Positive for constipation.  Genitourinary: Negative.   Musculoskeletal:  Positive for gait problem.  Skin: Negative.   Neurological:  Positive for weakness.  Psychiatric/Behavioral: Negative.     Immunization History   Administered Date(s) Administered   Moderna Sars-Covid-2 Vaccination 07/06/2019, 08/02/2019, 05/06/2020, 11/25/2020   PPD Test 07/21/2015, 08/04/2015   Tdap 07/16/2015   Unspecified SARS-COV-2 Vaccination 03/17/2021   Zoster Recombinat (Shingrix) 12/04/2013   Pertinent  Health Maintenance Due  Topic Date Due   INFLUENZA VACCINE  Discontinued   Fall Risk 08/17/2019 03/06/2020 07/04/2020 08/05/2020 09/12/2020  Falls in the past year? 0 0 0 0 0  Number of falls in past year - - - - -  Was there an injury with Fall? - - - 0 -  Was there an injury with Fall? - - - - -  Fall Risk Category Calculator - - - 0 -  Fall Risk Category - - - Low -  Patient Fall Risk Level - - - Low fall risk -   Functional Status Survey:    Vitals:   05/26/21 1139  BP: 130/70  Pulse: 67  Resp: 18  Temp: 97.6 F (36.4 C)  SpO2: 95%  Weight: 127 lb 6.4 oz (57.8 kg)  Height: 6\' 1"  (1.854 m)   Body mass index is 16.81 kg/m. Physical Exam Constitutional: Oriented to person, place, and time. Well-developed and well-nourished. Very Frail  HENT:  Head: Normocephalic.  Mouth/Throat: Oropharynx is clear and moist.  Eyes: Pupils are equal, round, and reactive to light.  Neck: Neck supple.  Cardiovascular: Normal rate and normal heart sounds.  No murmur heard. Pulmonary/Chest: Effort normal and breath sounds normal. No respiratory distress. No wheezes. Has few rales in the bases Abdominal: Soft. Bowel sounds are normal. No distension. There is no tenderness. There is no rebound.  Musculoskeletal: Chronic Venous changes with Mild edema bIlateral  Lymphadenopathy: none Neurological: Alert and oriented to person, place, and time.  Skin: Skin is warm and dry.  Psychiatric: Normal mood and affect. Behavior is normal. Thought content normal.   Labs reviewed: Recent Labs    10/10/20 0019 11/27/20 0000 03/20/21 0000  NA 142 140 140  K 4.2 3.8 4.0  CL 99 101 100  CO2 33* 35* 33*  BUN 39* 39* 37*  CREATININE  1.1 1.0 1.2  CALCIUM 9.6 9.5 9.5   Recent Labs    10/10/20 0019 11/27/20 0000 03/20/21 0000  AST 19 12* 13*  ALT 11 7* 8*  ALKPHOS 62 55 65  ALBUMIN 3.9 3.4* 3.8   Recent Labs    10/10/20 0019  WBC 5.0  NEUTROABS 2,830.00  HGB 13.0*  HCT 40*  PLT 312   Lab Results  Component Value Date   TSH 4.30 10/10/2020   No results found for: HGBA1C Lab Results  Component Value Date   CHOL 147 10/10/2020   HDL 53 10/10/2020   LDLCALC 81 10/10/2020   TRIG 51 10/10/2020   CHOLHDL 2.6 07/10/2019    Significant Diagnostic Results in last 30 days:  No results found.  Assessment/Plan Bilateral edema of lower extremity Continue on Lasix Creat normal PVD (peripheral vascular disease) (HCC) Low dose Diuretic Paroxysmal atrial fibrillation (HCC) On Digoxin and Plavix Has refused DOCA Acquired hypothyroidism Tsh Normal in 4/22 Stage 3a chronic kidney disease (HCC) Creat stable Hyperlipidemia LDL goal <70 On statin  Benign prostatic hyperplasia  Stable on Avadart Oropharyngeal dysphagia Weight has stabilized Refuses Speech or Modified Diet On Protonix Unstable gait Will have therapy evaluate for Power chair at his request CAD On Imdur Has refused further work up  [employer] Communication:   Labs/tests ordered:

## 2021-06-02 DIAGNOSIS — R2689 Other abnormalities of gait and mobility: Secondary | ICD-10-CM | POA: Diagnosis not present

## 2021-06-02 DIAGNOSIS — R278 Other lack of coordination: Secondary | ICD-10-CM | POA: Diagnosis not present

## 2021-06-05 DIAGNOSIS — R278 Other lack of coordination: Secondary | ICD-10-CM | POA: Diagnosis not present

## 2021-06-05 DIAGNOSIS — R2689 Other abnormalities of gait and mobility: Secondary | ICD-10-CM | POA: Diagnosis not present

## 2021-06-08 DIAGNOSIS — R2689 Other abnormalities of gait and mobility: Secondary | ICD-10-CM | POA: Diagnosis not present

## 2021-06-08 DIAGNOSIS — R278 Other lack of coordination: Secondary | ICD-10-CM | POA: Diagnosis not present

## 2021-06-23 DIAGNOSIS — R609 Edema, unspecified: Secondary | ICD-10-CM | POA: Diagnosis not present

## 2021-06-23 DIAGNOSIS — I10 Essential (primary) hypertension: Secondary | ICD-10-CM | POA: Diagnosis not present

## 2021-06-23 LAB — BASIC METABOLIC PANEL
BUN: 34 — AB (ref 4–21)
CO2: 33 — AB (ref 13–22)
Chloride: 101 (ref 99–108)
Creatinine: 1.1 (ref 0.6–1.3)
Glucose: 85
Potassium: 4.4 mEq/L (ref 3.5–5.1)
Sodium: 139 (ref 137–147)

## 2021-06-23 LAB — COMPREHENSIVE METABOLIC PANEL: Calcium: 9.8 (ref 8.7–10.7)

## 2021-07-09 DIAGNOSIS — Z79899 Other long term (current) drug therapy: Secondary | ICD-10-CM | POA: Diagnosis not present

## 2021-07-09 DIAGNOSIS — I10 Essential (primary) hypertension: Secondary | ICD-10-CM | POA: Diagnosis not present

## 2021-07-09 DIAGNOSIS — Z5181 Encounter for therapeutic drug level monitoring: Secondary | ICD-10-CM | POA: Diagnosis not present

## 2021-07-09 DIAGNOSIS — I480 Paroxysmal atrial fibrillation: Secondary | ICD-10-CM | POA: Diagnosis not present

## 2021-07-24 ENCOUNTER — Encounter: Payer: Self-pay | Admitting: Nurse Practitioner

## 2021-07-24 ENCOUNTER — Non-Acute Institutional Stay: Payer: Medicare Other | Admitting: Nurse Practitioner

## 2021-07-24 DIAGNOSIS — K219 Gastro-esophageal reflux disease without esophagitis: Secondary | ICD-10-CM | POA: Diagnosis not present

## 2021-07-24 DIAGNOSIS — I209 Angina pectoris, unspecified: Secondary | ICD-10-CM | POA: Diagnosis not present

## 2021-07-24 DIAGNOSIS — M159 Polyosteoarthritis, unspecified: Secondary | ICD-10-CM

## 2021-07-24 DIAGNOSIS — I480 Paroxysmal atrial fibrillation: Secondary | ICD-10-CM

## 2021-07-24 DIAGNOSIS — N400 Enlarged prostate without lower urinary tract symptoms: Secondary | ICD-10-CM | POA: Diagnosis not present

## 2021-07-24 DIAGNOSIS — E039 Hypothyroidism, unspecified: Secondary | ICD-10-CM

## 2021-07-24 DIAGNOSIS — I739 Peripheral vascular disease, unspecified: Secondary | ICD-10-CM | POA: Diagnosis not present

## 2021-07-24 DIAGNOSIS — E785 Hyperlipidemia, unspecified: Secondary | ICD-10-CM | POA: Diagnosis not present

## 2021-07-24 DIAGNOSIS — N1831 Chronic kidney disease, stage 3a: Secondary | ICD-10-CM

## 2021-07-24 NOTE — Progress Notes (Signed)
Location:   [facility] Room Number: 161 Place of Service:  ALF [phone]) Provider:  Chaia Ikard X, NP  Virgie Dad, MD  Patient Care Team: Virgie Dad, MD as PCP - General (Internal Medicine) Danella Sensing, MD as Consulting Physician (Dermatology) Mulan Adan X, NP as Nurse Practitioner (Internal Medicine)  Extended Emergency Contact Information Primary Emergency Contact: Clare,Bill Address: [address]          [place], [place] [address] Johnnette Litter of [place] Phone: [phone] Mobile Phone: [phone] Relation: Brother Secondary Emergency Contact: [place] Mobile Phone: [phone] Relation: Relative  Code Status:  DNR Goals of care: Advanced Directive information Advanced Directives 07/24/2021  Does Patient Have a Medical Advance Directive? Yes  Type of Paramedic of [place];Out of facility DNR (pink MOST or yellow form)  Does patient want to make changes to medical advance directive? No - Patient declined  Copy of [place] in Chart? Yes - validated most recent copy scanned in chart (See row information)  Pre-existing out of facility DNR order (yellow form or pink MOST form) Yellow form placed in chart (order not valid for inpatient use)     Chief Complaint  Patient presents with   Medical Management of Chronic Issues    Routine follow up visit   Health Maintenance    Pneumonia vaccine,shingrix vaccine, 4th COVID booster    HPI:  Pt is a 88 y.o. male seen today for medical management of chronic diseases.      The right buttock pressure ulcer  is healed, but short lived pain in the area come and goes, positional, the patient stated he doesn't want any further assessment or tx, attributed it to a muscular/arthritic etiology.               Chronic edema BLE, mostly LLE, improved, the patient admitted he sleFurosemide 54m  since 11/19/20, negative [place]             Hx of Afib, heart rate is in 50s, on Digoxin. Dig level 1.2 07/09/21, takes Plavix             Hypothyroidism, stable, on Levothyroxine 559m qd. 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   Cervical spondylosis without myelopathy 08/22/2015   Diverticulosis    Diverticulosis of colon without hemorrhage 08/22/2015   Edema 08/18/2015   Fall 07/17/2015   Gait disturbance 08/22/2015   GERD (gastroesophageal reflux disease)    occurs rarely   History of CVA (cerebrovascular accident) 07/17/2015   HLD (hyperlipidemia) 07/17/2015   Hyperlipidemia    Hypertension    Inguinal hernia    Left carotid  bruit    Melanoma ([place])    left forearm   Osteopenia    Paroxysmal atrial fibrillation ([place])    Physical deconditioning 08/22/2015   Retinal hemorrhage 04/24/2014   Rosacea blepharoconjunctivitis 11/20/2011   Stroke ([place]) 3/07   affected balance   Tubular adenoma of colon 2007   Weakness 07/17/2015   Past Surgical History:  Procedure Laterality Date   COLONOSCOPY  2008   EYE SURGERY Right    cataract extraction with IOL   INGUINAL HERNIA REPAIR Right 03/15/2013   Procedure: HERNIA REPAIR INGUINAL ADULT;  Surgeon: Odis Hollingshead, MD;  [facility];  Service: General;  Laterality: Right;   INSERTION OF MESH Right 03/15/2013   Procedure: INSERTION OF MESH;  Surgeon: Odis Hollingshead, MD;  [facility];  Service: General;  Laterality: Right;   left forearm melanoma      PROSTATE BIOPSY     SKIN SURGERY  08/04/2020   TONSILLECTOMY  1933    Allergies  Allergen Reactions   Aspirin    Eggs Or Egg-Derived Products Swelling    throat    Allergies as of 07/24/2021       Reactions   Aspirin    Eggs Or Egg-derived Products Swelling   throat        Medication List        Accurate as of July 24, 2021 11:59 PM. If you have any questions, ask your nurse or doctor.          cholecalciferol 10 MCG (400 UNIT) Tabs tablet Commonly known as: VITAMIN D3 Take 400 Units by mouth 2 (two) times daily.   clopidogrel 75 MG tablet Commonly known as: PLAVIX Take 75 mg by mouth daily. Start PLAVIX from 07/24/15 if no further hematuria is reported by patient and HB > 12.   digoxin 0.125 MG tablet Commonly known as: LANOXIN Take 0.125 mg by mouth daily.   dutasteride 0.5 MG capsule Commonly known as: AVODART Take 0.5 mg by mouth daily.   furosemide 20 MG tablet Commonly known as: LASIX Take 10 mg by mouth daily.   isosorbide mononitrate 30 MG 24 hr tablet Commonly known as: IMDUR Take 0.5 tablets (15 mg total) by mouth daily.   levothyroxine 50 MCG tablet Commonly known as: SYNTHROID Take 50 mcg by mouth daily before breakfast.   nitroGLYCERIN 0.4 MG SL tablet Commonly known as: NITROSTAT Place 1 tablet under the tongue as needed.   pantoprazole 40 MG tablet Commonly known as: PROTONIX Take 1 tablet (40 mg total) by mouth daily.   polyethylene glycol 17 g packet Commonly known as: MIRALAX / GLYCOLAX Take 17 g by mouth daily as needed.   POTASSIUM CHLORIDE ER PO Take 1 capsule by mouth every other day.   pravastatin 20 MG tablet Commonly known as: PRAVACHOL Take 1 tablet by mouth at bedtime.        Review of Systems  Constitutional:  Negative for fatigue, fever and unexpected weight change.  HENT:  Positive for hearing loss and trouble swallowing. Negative for congestion.        Cough while eating.   Eyes:  Negative for  visual disturbance.  Respiratory:  Negative for cough and shortness of breath.   Cardiovascular:  Positive for leg swelling. Negative for chest pain and palpitations.  Gastrointestinal:  Negative for abdominal pain and constipation.  Genitourinary:  Positive for frequency. Negative for dysuria and urgency.  Musculoskeletal:  Positive for arthralgias and gait problem.  Skin:  Negative for color change.  Neurological:  Negative for speech difficulty, weakness and headaches.  Psychiatric/Behavioral:  Negative for behavioral problems and sleep disturbance. The patient is not nervous/anxious.    Immunization History  Administered Date(s) Administered   Moderna Sars-Covid-2 Vaccination 07/06/2019, 08/02/2019, 05/06/2020, 11/25/2020   PPD Test 07/21/2015, 08/04/2015   Tdap 07/16/2015   Unspecified SARS-COV-2 Vaccination 03/17/2021   Zoster Recombinat (Shingrix) 12/04/2013   Pertinent  Health Maintenance Due  Topic Date Due   INFLUENZA VACCINE  Discontinued   Fall Risk 08/17/2019 03/06/2020 07/04/2020 08/05/2020 09/12/2020  Falls in the past year? 0 0 0 0 0  Number of falls in past year - - - - -  Was there an injury with Fall? - - - 0 -  Was there an injury with Fall? - - - - -  Fall Risk Category Calculator - - - 0 -  Fall Risk Category - - - Low -  Patient Fall Risk Level - - - Low fall risk -   Functional Status Survey:    Vitals:   07/24/21 0919  BP: 116/64  Pulse: 65  Resp: 18  Temp: (!) 97.3 F (36.3 C)  SpO2: 95%  Weight: 128 lb 6.4 oz (58.2 kg)  Height: '6\' 1"'  (1.854 m)   Body mass index is 16.94 kg/m. Physical Exam Vitals and nursing note reviewed.  Constitutional:      Appearance: Normal appearance.  HENT:     Head: Normocephalic and atraumatic.     Mouth/Throat:     Mouth: Mucous membranes are moist.  Eyes:     Extraocular Movements: Extraocular movements intact.     Conjunctiva/sclera: Conjunctivae normal.     Pupils: Pupils are equal, round, and reactive to  light.  Cardiovascular:     Rate and Rhythm: Normal rate. Rhythm irregular.     Heart sounds: No murmur heard. Pulmonary:     Breath sounds: No rales.  Abdominal:     Palpations: Abdomen is soft.     Tenderness: There is no abdominal tenderness.     Comments: Irregular schedule BM, but no constipation.   Musculoskeletal:     Cervical back: Normal range of motion and neck supple.     Right lower leg: No edema.     Left lower leg: Edema present.     Comments: Trace edema LLE  Skin:    General: Skin is warm and dry.     Comments: Chronic venous insufficiency skin changes.   Neurological:     General: No focal deficit present.     Mental Status: He is alert and oriented to person, place, and time. Mental status is at baseline.     Gait: Gait abnormal.  Psychiatric:        Mood and Affect: Mood normal.        Behavior: Behavior normal.        Thought Content: Thought content normal.        Judgment: Judgment normal.    Labs reviewed: Recent Labs    10/10/20 0019 11/27/20 0000 03/20/21 0000  NA 142 140 140  K 4.2 3.8 4.0  CL 99 101 100  CO2 33* 35* 33*  BUN 39* 39* 37*  CREATININE 1.1 1.0 1.2  CALCIUM 9.6 9.5 9.5   Recent Labs    10/10/20 0019 11/27/20 0000 03/20/21 0000  AST 19 12* 13*  ALT 11 7* 8*  ALKPHOS 62 55 65  ALBUMIN 3.9 3.4* 3.8   Recent Labs  10/10/20 0019  WBC 5.0  NEUTROABS 2,830.00  HGB 13.0*  HCT 40*  PLT 312   Lab Results  Component Value Date   TSH 4.30 10/10/2020   No results found for: HGBA1C Lab Results  Component Value Date   CHOL 147 10/10/2020   HDL 53 10/10/2020   LDLCALC 81 10/10/2020   TRIG 51 10/10/2020   CHOLHDL 2.6 07/10/2019    Significant Diagnostic Results in last 30 days:  No results found.  Assessment/Plan Paroxysmal atrial fibrillation (HCC) heart rate is in 50s, on Digoxin. Dig level 1.2 07/09/21, takes Plavix  Acquired hypothyroidism  stable, on Levothyroxine 31mg qd. TSH 4.3 10/10/20  BPH (benign  prostatic hyperplasia) stable, on Dutasteride 0.514mqd  CKD (chronic kidney disease) stage 3, GFR 30-59 ml/min (HCC) Bun/cret 34/1.06 eGFR 65 12/27/2/2  GERD (gastroesophageal reflux disease)  takes Pantoprazole, Hgb 13.0 10/10/20  Hyperlipidemia LDL goal <70 takes Pravastatin, LDL 81 10/10/20  Angina pectoris (HCC) No chest pain reported since last seen, takes Plavix, Isosorbide  PVD (peripheral vascular disease) ([place] LLE, improved, the patient admitted he sleFurosemide 1023msince 11/19/20, negative [place], multiple sites The right buttock pressure ulcer  is healed, but short lived pain in the area come and goes, positional, the patient stated he doesn't want any further assessment or tx, attributed it to a muscular/arthritic etiology.      Family/ staff Communication: plan of care reviewed with the patient and charge nurse.   Labs/tests ordered:  none  Time spend 40 minutes.

## 2021-07-28 ENCOUNTER — Encounter: Payer: Self-pay | Admitting: Nurse Practitioner

## 2021-07-28 DIAGNOSIS — M159 Polyosteoarthritis, unspecified: Secondary | ICD-10-CM | POA: Insufficient documentation

## 2021-07-28 NOTE — Assessment & Plan Note (Signed)
takes Pantoprazole, Hgb 13.0 10/10/20

## 2021-07-28 NOTE — Assessment & Plan Note (Signed)
The right buttock pressure ulcer  is healed, but short lived pain in the area come and goes, positional, the patient stated he doesn't want any further assessment or tx, attributed it to a muscular/arthritic etiology.

## 2021-07-28 NOTE — Assessment & Plan Note (Signed)
heart rate is in 50s, on Digoxin. Dig level 1.2 07/09/21, takes Plavix 

## 2021-07-28 NOTE — Assessment & Plan Note (Signed)
takes Pravastatin, LDL 81 10/10/20

## 2021-07-28 NOTE — Assessment & Plan Note (Signed)
mostly LLE, improved, the patient admitted he sleFurosemide 10mg   since 11/19/20, negative [place]

## 2021-07-28 NOTE — Assessment & Plan Note (Signed)
No chest pain reported since last seen, takes Plavix, Isosorbide

## 2021-07-28 NOTE — Assessment & Plan Note (Signed)
stable, on Dutasteride 0.5mg qd 

## 2021-07-28 NOTE — Assessment & Plan Note (Signed)
Bun/cret 34/1.06 eGFR 65 12/27/2/2

## 2021-07-28 NOTE — Assessment & Plan Note (Signed)
stable, on Levothyroxine 62mcg qd. TSH 4.3 10/10/20

## 2021-08-25 DIAGNOSIS — D485 Neoplasm of uncertain behavior of skin: Secondary | ICD-10-CM | POA: Diagnosis not present

## 2021-08-25 DIAGNOSIS — L579 Skin changes due to chronic exposure to nonionizing radiation, unspecified: Secondary | ICD-10-CM | POA: Diagnosis not present

## 2021-08-25 DIAGNOSIS — L821 Other seborrheic keratosis: Secondary | ICD-10-CM | POA: Diagnosis not present

## 2021-08-25 DIAGNOSIS — L570 Actinic keratosis: Secondary | ICD-10-CM | POA: Diagnosis not present

## 2021-08-25 DIAGNOSIS — L853 Xerosis cutis: Secondary | ICD-10-CM | POA: Diagnosis not present

## 2021-08-25 DIAGNOSIS — C44629 Squamous cell carcinoma of skin of left upper limb, including shoulder: Secondary | ICD-10-CM | POA: Diagnosis not present

## 2021-08-25 DIAGNOSIS — L814 Other melanin hyperpigmentation: Secondary | ICD-10-CM | POA: Diagnosis not present

## 2021-08-25 DIAGNOSIS — L820 Inflamed seborrheic keratosis: Secondary | ICD-10-CM | POA: Diagnosis not present

## 2021-08-25 DIAGNOSIS — Z85820 Personal history of malignant melanoma of skin: Secondary | ICD-10-CM | POA: Diagnosis not present

## 2021-08-25 DIAGNOSIS — D044 Carcinoma in situ of skin of scalp and neck: Secondary | ICD-10-CM | POA: Diagnosis not present

## 2021-09-22 DIAGNOSIS — L570 Actinic keratosis: Secondary | ICD-10-CM | POA: Diagnosis not present

## 2021-09-22 DIAGNOSIS — Z85820 Personal history of malignant melanoma of skin: Secondary | ICD-10-CM | POA: Diagnosis not present

## 2021-09-22 DIAGNOSIS — C44629 Squamous cell carcinoma of skin of left upper limb, including shoulder: Secondary | ICD-10-CM | POA: Diagnosis not present

## 2021-09-22 DIAGNOSIS — D044 Carcinoma in situ of skin of scalp and neck: Secondary | ICD-10-CM | POA: Diagnosis not present

## 2021-09-22 DIAGNOSIS — L579 Skin changes due to chronic exposure to nonionizing radiation, unspecified: Secondary | ICD-10-CM | POA: Diagnosis not present

## 2021-10-13 DIAGNOSIS — D044 Carcinoma in situ of skin of scalp and neck: Secondary | ICD-10-CM | POA: Diagnosis not present

## 2021-10-13 DIAGNOSIS — Z85820 Personal history of malignant melanoma of skin: Secondary | ICD-10-CM | POA: Diagnosis not present

## 2021-10-13 DIAGNOSIS — C44629 Squamous cell carcinoma of skin of left upper limb, including shoulder: Secondary | ICD-10-CM | POA: Diagnosis not present

## 2021-10-20 ENCOUNTER — Encounter: Payer: Self-pay | Admitting: Internal Medicine

## 2021-10-20 ENCOUNTER — Non-Acute Institutional Stay: Payer: Medicare Other | Admitting: Internal Medicine

## 2021-10-20 DIAGNOSIS — E785 Hyperlipidemia, unspecified: Secondary | ICD-10-CM | POA: Diagnosis not present

## 2021-10-20 DIAGNOSIS — R1312 Dysphagia, oropharyngeal phase: Secondary | ICD-10-CM

## 2021-10-20 DIAGNOSIS — E039 Hypothyroidism, unspecified: Secondary | ICD-10-CM

## 2021-10-20 DIAGNOSIS — I480 Paroxysmal atrial fibrillation: Secondary | ICD-10-CM

## 2021-10-20 DIAGNOSIS — R600 Localized edema: Secondary | ICD-10-CM | POA: Diagnosis not present

## 2021-10-20 DIAGNOSIS — N400 Enlarged prostate without lower urinary tract symptoms: Secondary | ICD-10-CM | POA: Diagnosis not present

## 2021-10-20 DIAGNOSIS — K219 Gastro-esophageal reflux disease without esophagitis: Secondary | ICD-10-CM

## 2021-10-20 DIAGNOSIS — N1831 Chronic kidney disease, stage 3a: Secondary | ICD-10-CM | POA: Diagnosis not present

## 2021-10-20 DIAGNOSIS — R2681 Unsteadiness on feet: Secondary | ICD-10-CM | POA: Diagnosis not present

## 2021-10-20 NOTE — Progress Notes (Signed)
?Location:  [facility] ?Nursing Home Room Number: 850 ?Place of Service:  ALF (13) ?Provider:  Veleta Miners MD ? ?Virgie Dad, MD ? ?Patient Care Team: ?Virgie Dad, MD as PCP - General (Internal Medicine) ?Danella Sensing, MD as Consulting Physician (Dermatology) ?Mast, Man X, NP as Nurse Practitioner (Internal Medicine) ? ?Extended Emergency Contact Information ?Primary Emergency Contact: Lindamood,Bill ?Address: [address] ?         [place], [place] [address] [place] ?Home Phone: [phone] ?Mobile Phone: [phone] ?Relation: Brother ?Secondary Emergency Contact: Fuqua,Barbara ? [place] ?Mobile Phone: [phone] ?Relation: Relative ? ?Code Status:  DNR ?Goals of care: Advanced Directive information ? ?  10/20/2021  ? 12:26 PM  ?Advanced Directives  ?Does Patient Have a Medical Advance Directive? Yes  ?Type of Paramedic of [place];Out of facility DNR (pink MOST or yellow form)  ?Does patient want to make changes to medical advance directive? No - Patient declined  ?Copy of [place] in Chart? Yes - validated most recent copy scanned in chart (See row information)  ?Pre-existing out of facility DNR order (yellow form or pink MOST form) Yellow form placed in chart (order not valid for inpatient use)  ? ? ? ?Chief Complaint  ?Patient presents with  ? Medical Management of Chronic Issues  ? ? ?HPI:  ?Pt is a 88 y.o. male seen today for medical management of chronic diseases.   ? ?He has h/o PAF, Hypertension, Hyperlipidemia, BPH, H/o CVA ?Possible Oropharyngeal Dysphagia Refused work up ? Now lives in [place] ? ?Doing well did not have any new issue today ? ? ?He is stable. No new Nursing issues. No Behavior issues ?His weight is stable ?Walks with her walker in his room and uses Wheelchair for [employer] ?No Falls ?Wt Readings from Last 3 Encounters:  ?10/20/21 131 lb (59.4 kg)  ?07/24/21 128 lb 6.4 oz (58.2 kg)   ?05/26/21 127 lb 6.4 oz (57.8 kg)  ? ? ?Past Medical History:  ?Diagnosis Date  ? Anginal pain ([place])   ? nuclear stress 03/12/13 EPIC  ? Balance problem 07/17/2015  ? Basal cell carcinoma   ? left forearm  ? BPH (benign prostatic hyperplasia)   ? Cataract, nuclear 08/08/2014  ? 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  ? Cervical spondylosis without myelopathy 08/22/2015  ? Diverticulosis   ? Diverticulosis of colon without hemorrhage 08/22/2015  ? Edema 08/18/2015  ? Fall 07/17/2015  ? Gait disturbance 08/22/2015  ? GERD (gastroesophageal reflux disease)   ? occurs rarely  ? History of CVA (cerebrovascular accident) 07/17/2015  ? HLD (hyperlipidemia) 07/17/2015  ? Hyperlipidemia   ? Hypertension   ? Inguinal hernia   ? Left carotid bruit   ? Melanoma ([place])   ? left forearm  ? Osteopenia   ? Paroxysmal atrial fibrillation (HCC)   ? Physical deconditioning 08/22/2015  ? Retinal hemorrhage 04/24/2014  ? Rosacea blepharoconjunctivitis 11/20/2011  ? Stroke [facility]) 3/07  ? affected balance  ? Tubular adenoma of colon 2007  ? Weakness 07/17/2015  ? ?Past Surgical History:  ?Procedure Laterality Date  ? COLONOSCOPY  2008  ? EYE SURGERY Right   ? cataract extraction with IOL  ? INGUINAL HERNIA REPAIR Right 03/15/2013  ? Procedure: HERNIA REPAIR INGUINAL ADULT;  Surgeon: Odis Hollingshead, MD;  Location: [facility];  Service: General;  Laterality: Right;  ? INSERTION OF MESH Right 03/15/2013  ? Procedure: INSERTION OF MESH;  Surgeon: Rhunette Croft  Rosenbower, MD;  Location: [facility];  Service: General;  Laterality: Right;  ? left forearm melanoma    ? PROSTATE BIOPSY    ? SKIN SURGERY  08/04/2020  ? TONSILLECTOMY  1933  ? ? ?Allergies  ?Allergen Reactions  ? Aspirin   ? Eggs Or Egg-Derived Products Swelling  ?  throat  ? ? ?Allergies as of 10/20/2021   ? ?   Reactions  ? Aspirin   ? Eggs Or Egg-derived Products Swelling  ? throat  ? ?  ? ?  ?Medication List  ?  ? ?  ? Accurate as of October 20, 2021 12:26 PM. If you have any questions, ask your nurse or doctor.   ?  ?  ? ?  ? ?ammonium lactate 12 % lotion ?Commonly known as: LAC-HYDRIN ?Apply 1 application. topically at bedtime. ?  ?cholecalciferol 10 MCG (400 UNIT) Tabs tablet ?Commonly known as: VITAMIN D3 ?Take 400 Units by mouth 2 (two) times daily. ?  ?clopidogrel 75 MG tablet ?Commonly known as: PLAVIX ?Take 75 mg by mouth daily. Start PLAVIX from 07/24/15 if no further hematuria is reported by patient and HB > 12. ?  ?digoxin 0.125 MG tablet ?Commonly known as: LANOXIN ?Take 0.125 mg by mouth daily. ?  ?dutasteride 0.5 MG capsule ?Commonly known as: AVODART ?Take 0.5 mg by mouth daily. ?  ?furosemide 20 MG tablet ?Commonly known as: LASIX ?Take 20 mg by mouth every other day. ?  ?isosorbide mononitrate 30 MG 24 hr tablet ?Commonly known as: IMDUR ?Take 0.5 tablets (15 mg total) by mouth daily. ?  ?levothyroxine 50 MCG tablet ?Commonly known as: SYNTHROID ?Take 50 mcg by mouth daily before breakfast. ?  ?nitroGLYCERIN 0.4 MG SL tablet ?Commonly known as: NITROSTAT ?Place 1 tablet under the tongue as needed. ?  ?pantoprazole 40 MG tablet ?Commonly known as: PROTONIX ?Take 1 tablet (40 mg total) by mouth daily. ?  ?polyethylene glycol 17 g packet ?Commonly known as: MIRALAX / GLYCOLAX ?Take 17 g by mouth daily as needed. ?  ?POTASSIUM CHLORIDE ER PO ?Take 1 capsule by mouth every other day. ?  ?pravastatin 20 MG tablet ?Commonly known as: PRAVACHOL ?Take 1 tablet by mouth at bedtime. ?  ? ?  ? ? ?Review of Systems  ?Constitutional:  Negative for activity change, appetite change and unexpected weight change.  ?HENT: Negative.    ?Respiratory:  Negative for cough and shortness of breath.   ?Cardiovascular:  Positive for leg swelling.  ?Gastrointestinal:  Negative for constipation.  ?Genitourinary:  Negative for frequency.  ?Musculoskeletal:  Positive for gait problem. Negative for arthralgias and myalgias.  ?Skin: Negative.  Negative for rash.  ?Neurological:  Negative for dizziness and weakness.  ?Psychiatric/Behavioral:   Negative for confusion and sleep disturbance.   ?All other systems reviewed and are negative. ? ?Immunization History  ?Administered Date(s) Administered  ? Moderna Sars-Covid-2 Vaccination 07/06/2019, 08/02/2019, 05/06/2020, 11/25/2020  ? PPD Test 07/21/2015, 08/04/2015  ? Tdap 07/16/2015  ? Unspecified SARS-COV-2 Vaccination 03/17/2021  ? Zoster Recombinat (Shingrix) 12/04/2013  ? ?Pertinent  Health Maintenance Due  ?Topic Date Due  ? INFLUENZA VACCINE  Discontinued  ? ? ?  08/17/2019  ? 10:13 AM 03/06/2020  ? 11:12 AM 07/04/2020  ? 11:15 AM 08/05/2020  ?  2:48 PM 09/12/2020  ? 10:19 AM  ?Fall Risk  ?Falls in the past year? 0 0 0 0 0  ?Was there an injury with Fall?    0   ?Fall Risk Category  Calculator    0   ?Fall Risk Category    Low   ?Patient Fall Risk Level    Low fall risk   ? ?Functional Status Survey: ?  ? ?Vitals:  ? 10/20/21 1217  ?BP: (!) 146/60  ?Pulse: 74  ?Resp: 18  ?Temp: 97.9 ?F (36.6 ?C)  ?SpO2: 94%  ?Weight: 131 lb (59.4 kg)  ?Height: '6\' 1"'$  (1.854 m)  ? ?Body mass index is 17.28 kg/m?Marland Kitchen ?Physical Exam ?Vitals reviewed.  ?Constitutional:   ?   Appearance: Normal appearance.  ?HENT:  ?   Head: Normocephalic.  ?   Nose: Nose normal.  ?   Mouth/Throat:  ?   Mouth: Mucous membranes are moist.  ?   Pharynx: Oropharynx is clear.  ?Eyes:  ?   Pupils: Pupils are equal, round, and reactive to light.  ?Cardiovascular:  ?   Rate and Rhythm: Normal rate and regular rhythm.  ?   Pulses: Normal pulses.  ?   Heart sounds: No murmur heard. ?Pulmonary:  ?   Effort: Pulmonary effort is normal. No respiratory distress.  ?   Breath sounds: Normal breath sounds. No rales.  ?Abdominal:  ?   General: Abdomen is flat. Bowel sounds are normal.  ?   Palpations: Abdomen is soft.  ?Musculoskeletal:  ?   Cervical back: Neck supple.  ?   Comments: Mild Swelling BIlateral  ?Skin: ?   General: Skin is warm.  ?Neurological:  ?   General: No focal deficit present.  ?   Mental Status: He is alert and oriented to person, place, and time.   ?Psychiatric:     ?   Mood and Affect: Mood normal.     ?   Thought Content: Thought content normal.  ? ? ?Labs reviewed: ?Recent Labs  ?  11/27/20 ?0000 03/20/21 ?0000 06/23/21 ?0000  ?NA 140 140 139  ?K 3.

## 2021-10-22 DIAGNOSIS — I10 Essential (primary) hypertension: Secondary | ICD-10-CM | POA: Diagnosis not present

## 2021-10-22 DIAGNOSIS — R609 Edema, unspecified: Secondary | ICD-10-CM | POA: Diagnosis not present

## 2021-10-22 DIAGNOSIS — E039 Hypothyroidism, unspecified: Secondary | ICD-10-CM | POA: Diagnosis not present

## 2021-10-22 LAB — TSH: TSH: 3.12 (ref 0.41–5.90)

## 2021-10-22 LAB — BASIC METABOLIC PANEL
BUN: 29 — AB (ref 4–21)
CO2: 34 — AB (ref 13–22)
Chloride: 103 (ref 99–108)
Creatinine: 1 (ref 0.6–1.3)
Glucose: 88
Potassium: 4.5 mEq/L (ref 3.5–5.1)
Sodium: 141 (ref 137–147)

## 2021-10-22 LAB — HEPATIC FUNCTION PANEL
ALT: 6 U/L — AB (ref 10–40)
AST: 13 — AB (ref 14–40)
Alkaline Phosphatase: 66 (ref 25–125)
Bilirubin, Total: 0.6

## 2021-10-22 LAB — COMPREHENSIVE METABOLIC PANEL
Albumin: 3.5 (ref 3.5–5.0)
Calcium: 9.4 (ref 8.7–10.7)
Globulin: 2.5
eGFR: 73

## 2021-10-22 LAB — CBC AND DIFFERENTIAL
HCT: 40 — AB (ref 41–53)
Hemoglobin: 13.1 — AB (ref 13.5–17.5)
Neutrophils Absolute: 1720
Platelets: 212 10*3/uL (ref 150–400)
WBC: 3.9

## 2021-10-22 LAB — LIPID PANEL
Cholesterol: 133 (ref 0–200)
HDL: 43 (ref 35–70)
LDL Cholesterol: 76
Triglycerides: 59 (ref 40–160)

## 2021-10-22 LAB — CBC: RBC: 4.28 (ref 3.87–5.11)

## 2021-10-27 DIAGNOSIS — Z23 Encounter for immunization: Secondary | ICD-10-CM | POA: Diagnosis not present

## 2021-11-09 ENCOUNTER — Other Ambulatory Visit: Payer: Self-pay | Admitting: Orthopedic Surgery

## 2021-11-11 ENCOUNTER — Encounter: Payer: Self-pay | Admitting: Orthopedic Surgery

## 2021-11-11 ENCOUNTER — Non-Acute Institutional Stay: Payer: Medicare Other | Admitting: Orthopedic Surgery

## 2021-11-11 DIAGNOSIS — L03113 Cellulitis of right upper limb: Secondary | ICD-10-CM | POA: Diagnosis not present

## 2021-11-11 NOTE — Progress Notes (Signed)
?Location:  [facility] ?Nursing Home Room Number: [id number]/[id number] ?Place of Service:  ALF (13) ?Provider: Yvonna Alanis, NP ? ?Patient Care Team: ?Virgie Dad, MD as PCP - General (Internal Medicine) ?Danella Sensing, MD as Consulting Physician (Dermatology) ?Mast, Man X, NP as Nurse Practitioner (Internal Medicine) ? ?Extended Emergency Contact Information ?Primary Emergency Contact: Filter,Bill ?Address: [address] ?         [place], [place] [address] [place] ?Home Phone: [phone] ?Mobile Phone: [phone] ?Relation: Brother ?Secondary Emergency Contact: Mollica,Barbara ? [place] ?Mobile Phone: [phone] ?Relation: Relative ? ?Code Status:  DNR ?Goals of care: Advanced Directive information ? ?  11/11/2021  ? 12:18 PM  ?Advanced Directives  ?Does Patient Have a Medical Advance Directive? Yes  ?Type of Paramedic of [place];Out of facility DNR (pink MOST or yellow form)  ?Does patient want to make changes to medical advance directive? No - Patient declined  ?Copy of [place] in Chart? Yes - validated most recent copy scanned in chart (See row information)  ?Pre-existing out of facility DNR order (yellow form or pink MOST form) Yellow form placed in chart (order not valid for inpatient use)  ? ? ? ?Chief Complaint  ?Patient presents with  ? Acute Visit  ?  Right elbow redness  ? ? ?HPI:  ?Pt is a 88 y.o. male seen today for an acute visit for right elbow redness.  ? ?05/01 he was noted to have a skin tear to right elbow. 05/15 his skin tear had not healed, was noted to be a little bigger in size. Prostat was started to help with healing. Today, nursing reports increased redness to right elbow. Tenderness and purulent drainage on bandage also reported. He denies pain or fever. He keeps touching wound with hands. He is able to move elbow without difficulty.  ? ? ?Past Medical History:  ?Diagnosis Date  ? Anginal pain ([place])   ?  nuclear stress 03/12/13 EPIC  ? Balance problem 07/17/2015  ? Basal cell carcinoma   ? left forearm  ? BPH (benign prostatic hyperplasia)   ? Cataract, nuclear 08/08/2014  ? 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  ? Cervical spondylosis without myelopathy 08/22/2015  ? Diverticulosis   ? Diverticulosis of colon without hemorrhage 08/22/2015  ? Edema 08/18/2015  ? Fall 07/17/2015  ? Gait disturbance 08/22/2015  ? GERD (gastroesophageal reflux disease)   ? occurs rarely  ? History of CVA (cerebrovascular accident) 07/17/2015  ? HLD (hyperlipidemia) 07/17/2015  ? Hyperlipidemia   ? Hypertension   ? Inguinal hernia   ? Left carotid bruit   ? Melanoma ([place])   ? left forearm  ? Osteopenia   ? Paroxysmal atrial fibrillation (HCC)   ? Physical deconditioning 08/22/2015  ? Retinal hemorrhage 04/24/2014  ? Rosacea blepharoconjunctivitis 11/20/2011  ? Stroke [facility]) 3/07  ? affected balance  ? Tubular adenoma of colon 2007  ? Weakness 07/17/2015  ? ?Past Surgical History:  ?Procedure Laterality Date  ? COLONOSCOPY  2008  ? EYE SURGERY Right   ? cataract extraction with IOL  ? INGUINAL HERNIA REPAIR Right 03/15/2013  ? Procedure: HERNIA REPAIR INGUINAL ADULT;  Surgeon: Odis Hollingshead, MD;  Location: [facility];  Service: General;  Laterality: Right;  ? INSERTION OF MESH Right 03/15/2013  ? Procedure: INSERTION OF MESH;  Surgeon: Odis Hollingshead, MD;  Location: [facility];  Service: General;  Laterality: Right;  ? left forearm melanoma    ?  PROSTATE BIOPSY    ? SKIN SURGERY  08/04/2020  ? TONSILLECTOMY  1933  ? ? ?Allergies  ?Allergen Reactions  ? Aspirin   ? Eggs Or Egg-Derived Products Swelling  ?  throat  ? ? ?Outpatient Encounter Medications as of 11/11/2021  ?Medication Sig  ? ammonium lactate (LAC-HYDRIN) 12 % lotion Apply 1 application. topically at bedtime.  ? cholecalciferol (VITAMIN D) 400 units TABS tablet Take 400 Units by mouth 2 (two) times daily.  ? clopidogrel (PLAVIX) 75 MG tablet Take 75 mg by mouth daily. Start PLAVIX from 07/24/15 if  no further hematuria is reported by patient and HB > 12.  ? digoxin (LANOXIN) 0.125 MG tablet Take 0.125 mg by mouth daily.  ? doxycycline (VIBRAMYCIN) 100 MG capsule Take 100 mg by mouth 2 (two) times daily.  ? dutasteride (AVODART) 0.5 MG capsule Take 0.5 mg by mouth daily.   ? furosemide (LASIX) 20 MG tablet Take 20 mg by mouth every other day.  ? isosorbide mononitrate (IMDUR) 30 MG 24 hr tablet Take 0.5 tablets (15 mg total) by mouth daily.  ? levothyroxine (SYNTHROID, LEVOTHROID) 50 MCG tablet Take 50 mcg by mouth daily before breakfast.  ? nitroGLYCERIN (NITROSTAT) 0.4 MG SL tablet Place 1 tablet under the tongue as needed.  ? pantoprazole (PROTONIX) 40 MG tablet Take 1 tablet (40 mg total) by mouth daily.  ? polyethylene glycol (MIRALAX / GLYCOLAX) 17 g packet Take 17 g by mouth daily as needed.  ? POTASSIUM CHLORIDE ER PO Take 1 capsule by mouth every other day.  ? pravastatin (PRAVACHOL) 20 MG tablet Take 1 tablet by mouth at bedtime.  ? saccharomyces boulardii (FLORASTOR) 250 MG capsule Take 250 mg by mouth 2 (two) times daily.  ? UNABLE TO FIND Take 30 mLs by mouth daily. Med Name: Prostate Liquid  ? ?No facility-administered encounter medications on file as of 11/11/2021.  ? ? ?Review of Systems  ?Constitutional:  Negative for activity change, appetite change, chills, fatigue and fever.  ?Respiratory:  Negative for cough, shortness of breath and wheezing.   ?Cardiovascular:  Negative for chest pain and leg swelling.  ?Skin:  Positive for color change and wound.  ?Psychiatric/Behavioral:  Negative for confusion and dysphoric mood. The patient is not nervous/anxious.   ? ?Immunization History  ?Administered Date(s) Administered  ? Moderna Sars-Covid-2 Vaccination 07/06/2019, 08/02/2019, 05/06/2020, 11/25/2020  ? PPD Test 07/21/2015, 08/04/2015  ? Tdap 07/16/2015  ? Unspecified SARS-COV-2 Vaccination 03/17/2021  ? Zoster Recombinat (Shingrix) 12/04/2013  ? ?Pertinent  Health Maintenance Due  ?Topic Date Due   ? INFLUENZA VACCINE  Discontinued  ? ? ?  08/17/2019  ? 10:13 AM 03/06/2020  ? 11:12 AM 07/04/2020  ? 11:15 AM 08/05/2020  ?  2:48 PM 09/12/2020  ? 10:19 AM  ?Fall Risk  ?Falls in the past year? 0 0 0 0 0  ?Was there an injury with Fall?    0   ?Fall Risk Category Calculator    0   ?Fall Risk Category    Low   ?Patient Fall Risk Level    Low fall risk   ? ?Functional Status Survey: ?  ? ?Vitals:  ? 11/11/21 1213  ?BP: 106/66  ?Pulse: 95  ?Resp: 18  ?Temp: 98 ?F (36.7 ?C)  ?SpO2: 96%  ?Weight: 127 lb 12.8 oz (58 kg)  ?Height: '6\' 1"'$  (1.854 m)  ? ?Body mass index is 16.86 kg/m?Marland Kitchen ?Physical Exam ?Vitals reviewed.  ?Constitutional:   ?   General:  He is not in acute distress. ?HENT:  ?   Head: Normocephalic.  ?Eyes:  ?   General:     ?   Right eye: No discharge.     ?   Left eye: No discharge.  ?Cardiovascular:  ?   Rate and Rhythm: Normal rate. Rhythm irregular.  ?   Pulses: Normal pulses.  ?   Heart sounds: Normal heart sounds.  ?Pulmonary:  ?   Effort: Pulmonary effort is normal. No respiratory distress.  ?   Breath sounds: Normal breath sounds. No wheezing.  ?Abdominal:  ?   General: Bowel sounds are normal. There is no distension.  ?   Palpations: Abdomen is soft.  ?   Tenderness: There is no abdominal tenderness.  ?Musculoskeletal:  ?   Right elbow: Swelling present. No deformity or lacerations. Normal range of motion. Tenderness present.  ?   Right lower leg: No edema.  ?   Left lower leg: No edema.  ?Skin: ?   General: Skin is warm and dry.  ?   Capillary Refill: Capillary refill takes less than 2 seconds.  ?   Findings: Erythema present.  ?   Comments: Redness and swelling over anterior portion of elbow (about 2-3 cm width) tender to touch, some skin breakdown, drainage appears to be serous, old dressing with purulent drainage, skin slightly warm to touch  ?Neurological:  ?   General: No focal deficit present.  ?   Mental Status: He is alert and oriented to person, place, and time.  ?   Motor: Weakness present.  ?    Gait: Gait abnormal.  ?   Comments: W/c  ?Psychiatric:     ?   Mood and Affect: Mood normal.     ?   Behavior: Behavior normal.  ? ? ?Labs reviewed: ?Recent Labs  ?  03/20/21 ?0000 06/23/21 ?0000 10/22/21 ?0

## 2021-11-13 DIAGNOSIS — Z23 Encounter for immunization: Secondary | ICD-10-CM | POA: Diagnosis not present

## 2021-12-08 DIAGNOSIS — I480 Paroxysmal atrial fibrillation: Secondary | ICD-10-CM | POA: Diagnosis not present

## 2021-12-24 DIAGNOSIS — C44622 Squamous cell carcinoma of skin of right upper limb, including shoulder: Secondary | ICD-10-CM | POA: Diagnosis not present

## 2021-12-24 DIAGNOSIS — D485 Neoplasm of uncertain behavior of skin: Secondary | ICD-10-CM | POA: Diagnosis not present

## 2021-12-24 DIAGNOSIS — Z85828 Personal history of other malignant neoplasm of skin: Secondary | ICD-10-CM | POA: Diagnosis not present

## 2021-12-24 DIAGNOSIS — D044 Carcinoma in situ of skin of scalp and neck: Secondary | ICD-10-CM | POA: Diagnosis not present

## 2021-12-24 DIAGNOSIS — Z85820 Personal history of malignant melanoma of skin: Secondary | ICD-10-CM | POA: Diagnosis not present

## 2021-12-24 DIAGNOSIS — L98499 Non-pressure chronic ulcer of skin of other sites with unspecified severity: Secondary | ICD-10-CM | POA: Diagnosis not present

## 2022-01-05 ENCOUNTER — Non-Acute Institutional Stay: Payer: Medicare Other | Admitting: Internal Medicine

## 2022-01-05 ENCOUNTER — Encounter: Payer: Self-pay | Admitting: Internal Medicine

## 2022-01-05 DIAGNOSIS — I480 Paroxysmal atrial fibrillation: Secondary | ICD-10-CM | POA: Diagnosis not present

## 2022-01-05 DIAGNOSIS — N1831 Chronic kidney disease, stage 3a: Secondary | ICD-10-CM | POA: Diagnosis not present

## 2022-01-05 DIAGNOSIS — K219 Gastro-esophageal reflux disease without esophagitis: Secondary | ICD-10-CM | POA: Diagnosis not present

## 2022-01-05 DIAGNOSIS — N400 Enlarged prostate without lower urinary tract symptoms: Secondary | ICD-10-CM | POA: Diagnosis not present

## 2022-01-05 DIAGNOSIS — E039 Hypothyroidism, unspecified: Secondary | ICD-10-CM

## 2022-01-05 DIAGNOSIS — R1312 Dysphagia, oropharyngeal phase: Secondary | ICD-10-CM | POA: Diagnosis not present

## 2022-01-05 DIAGNOSIS — R600 Localized edema: Secondary | ICD-10-CM

## 2022-01-05 DIAGNOSIS — E785 Hyperlipidemia, unspecified: Secondary | ICD-10-CM | POA: Diagnosis not present

## 2022-01-05 NOTE — Progress Notes (Signed)
Location:   [facility] Room Number: [place] of Service:  ALF [phone]) Provider:  Veleta Miners MD  Virgie Dad, MD  Patient Care Team: Virgie Dad, MD as PCP - General (Internal Medicine) Danella Sensing, MD as Consulting Physician (Dermatology) Mast, Man X, NP as Nurse Practitioner (Internal Medicine)  Extended Emergency Contact Information Primary Emergency Contact: Stenglein,Bill Address: [place]          [place], [place] [address] Johnnette Litter of [place] Phone: [phone] Mobile Phone: [phone] Relation: Brother Secondary Emergency Contact: Rosaland Lao [place] of [place] Mobile Phone: [phone] Relation: Relative  Code Status:  DNR Goals of care: Advanced Directive information    01/05/2022    2:26 PM  Advanced Directives  Does Patient Have a Medical Advance Directive? Yes  Type of Paramedic of [place];Out of facility DNR (pink MOST or yellow form)  Does patient want to make changes to medical advance directive? No - Patient declined  Copy of [place] in Chart? Yes - validated most recent copy scanned in chart (See row information)  Pre-existing out of facility DNR order (yellow form or pink MOST form) Yellow form placed in chart (order not valid for inpatient use)     Chief Complaint  Patient presents with   Acute Visit    LE edema     HPI:  Pt is a 88 y.o. male seen today for an acute visit for Bilateral LE edema  He has h/o PAF, Hypertension, Hyperlipidemia, BPH, H/o CVA Possible Oropharyngeal Dysphagia Refused work up  Now lives in [place]   Noticed some swelling in his legs when he got up this morning in [place] area. But then he says now it is only in Ankle when he is sitting in his Wheelchair Denies any pain. 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   Cervical spondylosis without myelopathy 08/22/2015   Diverticulosis    Diverticulosis of colon without hemorrhage 08/22/2015   Edema 08/18/2015   Fall 07/17/2015   Gait disturbance 08/22/2015   GERD (gastroesophageal reflux disease)    occurs rarely   History of CVA (cerebrovascular accident) 07/17/2015   HLD (hyperlipidemia) 07/17/2015   Hyperlipidemia    Hypertension    Inguinal hernia    Left carotid bruit    Melanoma ([place])    left forearm   Osteopenia    Paroxysmal atrial fibrillation ([place])    Physical deconditioning 08/22/2015   Retinal hemorrhage 04/24/2014   Rosacea blepharoconjunctivitis 11/20/2011   Stroke ([place]) 3/07   affected balance   Tubular adenoma of colon 2007   Weakness 07/17/2015   Past Surgical History:  Procedure Laterality Date   COLONOSCOPY  2008   EYE SURGERY Right    cataract extraction with IOL   INGUINAL HERNIA REPAIR Right 03/15/2013   Procedure: HERNIA  REPAIR INGUINAL ADULT;  Surgeon: Odis Hollingshead, MD;  Location: [facility];  Service: General;  Laterality: Right;   INSERTION OF MESH Right 03/15/2013   Procedure: INSERTION OF MESH;  Surgeon: Odis Hollingshead, MD;  Location: [facility];  Service: General;  Laterality: Right;   left forearm melanoma     PROSTATE BIOPSY     SKIN SURGERY  08/04/2020   TONSILLECTOMY  1933    Allergies  Allergen Reactions   Aspirin    Eggs Or Egg-Derived Products Swelling    throat    Allergies as of 01/05/2022       Reactions   Aspirin    Eggs Or Egg-derived Products Swelling   throat         Medication List        Accurate as of January 05, 2022  2:27 PM. If you have any questions, ask your nurse or doctor.          STOP taking these medications    doxycycline 100 MG capsule Commonly known as: VIBRAMYCIN Stopped by: Virgie Dad, MD   Florastor 250 MG capsule Generic drug: saccharomyces boulardii Stopped by: Virgie Dad, MD       TAKE these medications    ammonium lactate 12 % lotion Commonly known as: LAC-HYDRIN Apply 1 application. topically at bedtime.   cholecalciferol 10 MCG (400 UNIT) Tabs tablet Commonly known as: VITAMIN D3 Take 400 Units by mouth 2 (two) times daily.   clopidogrel 75 MG tablet Commonly known as: PLAVIX Take 75 mg by mouth daily. Start PLAVIX from 07/24/15 if no further hematuria is reported by patient and HB > 12.   digoxin 0.125 MG tablet Commonly known as: LANOXIN Take 0.125 mg by mouth daily.   dutasteride 0.5 MG capsule Commonly known as: AVODART Take 0.5 mg by mouth daily.   furosemide 20 MG tablet Commonly known as: LASIX Take 20 mg by mouth every other day.   isosorbide mononitrate 30 MG 24 hr tablet Commonly known as: IMDUR Take 0.5 tablets (15 mg total) by mouth daily.   levothyroxine 50 MCG tablet Commonly known as: SYNTHROID Take 50 mcg by mouth daily before breakfast.   nitroGLYCERIN 0.4 MG SL tablet Commonly known as: NITROSTAT Place 1 tablet under the tongue as needed.   pantoprazole 40 MG tablet Commonly known as: PROTONIX Take 1 tablet (40 mg total) by mouth daily.   polyethylene glycol 17 g packet Commonly known as: MIRALAX / GLYCOLAX Take 17 g by mouth daily as needed.   POTASSIUM CHLORIDE ER PO Take 1 capsule by mouth every other day.   pravastatin 20 MG tablet Commonly known as: PRAVACHOL Take 1 tablet by mouth at bedtime.   sodium fluoride 1.1 % Crea dental cream Commonly known as: PREVIDENT 5000 PLUS Place 1 Application onto teeth every evening.   UNABLE TO FIND Take 30 mLs  by mouth daily. Med Name: Prostate Liquid        Review of Systems  Constitutional:  Negative for activity change, appetite change and unexpected weight change.  HENT: Negative.    Respiratory:  Negative for cough and shortness of breath.   Cardiovascular:  Positive for leg swelling.  Gastrointestinal:  Negative for constipation.  Genitourinary:  Positive for frequency.  Musculoskeletal:  Positive for gait problem. Negative for arthralgias and myalgias.  Skin:  Positive for wound. Negative for rash.  Neurological:  Negative for dizziness and weakness.  Psychiatric/Behavioral:  Negative for confusion and sleep disturbance.  All other systems reviewed and are negative.   Immunization History  Administered Date(s) Administered   Moderna Sars-Covid-2 Vaccination 07/06/2019, 08/02/2019, 05/06/2020, 11/25/2020   PPD Test 07/21/2015, 08/04/2015   Tdap 07/16/2015   Unspecified SARS-COV-2 Vaccination 03/17/2021   Zoster Recombinat (Shingrix) 12/04/2013   Pertinent  Health Maintenance Due  Topic Date Due   INFLUENZA VACCINE  Discontinued      08/17/2019   10:13 AM 03/06/2020   11:12 AM 07/04/2020   11:15 AM 08/05/2020    2:48 PM 09/12/2020   10:19 AM  Fall Risk  Falls in the past year? 0 0 0 0 0  Was there an injury with Fall?    0   Fall Risk Category Calculator    0   Fall Risk Category    Low   Patient Fall Risk Level    Low fall risk    Functional Status Survey:    Vitals:   01/05/22 1417  BP: 111/70  Pulse: 64  Resp: 18  Temp: 97.7 F (36.5 C)  SpO2: 94%  Weight: 134 lb 3.2 oz (60.9 kg)  Height: '6\' 1"'$  (1.854 m)   Body mass index is 17.71 kg/m. Physical Exam Vitals reviewed.  Constitutional:      Appearance: Normal appearance.  HENT:     Head: Normocephalic.     Nose: Nose normal.     Mouth/Throat:     Mouth: Mucous membranes are moist.     Pharynx: Oropharynx is clear.  Eyes:     Pupils: Pupils are equal, round, and reactive to light.  Cardiovascular:      Rate and Rhythm: Normal rate. Rhythm irregular.     Pulses: Normal pulses.     Heart sounds: No murmur heard. Pulmonary:     Effort: Pulmonary effort is normal. No respiratory distress.     Breath sounds: Normal breath sounds. No rales.  Abdominal:     General: Abdomen is flat. Bowel sounds are normal.     Palpations: Abdomen is soft.  Musculoskeletal:        General: Swelling present.     Cervical back: Neck supple.     Comments: Bilateral Swelling around the Ankles No redness. Does have skin lesions in Right leg  Skin:    General: Skin is warm.  Neurological:     General: No focal deficit present.     Mental Status: He is alert and oriented to person, place, and time.  Psychiatric:        Mood and Affect: Mood normal.        Thought Content: Thought content normal.     Labs reviewed: Recent Labs    03/20/21 0000 06/23/21 0000 10/22/21 0000  NA 140 139 141  K 4.0 4.4 4.5  CL 100 101 103  CO2 33* 33* 34*  BUN 37* 34* 29*  CREATININE 1.2 1.1 1.0  CALCIUM 9.5 9.8 9.4   Recent Labs    03/20/21 0000 10/22/21 0000  AST 13* 13*  ALT 8* 6*  ALKPHOS 65 66  ALBUMIN 3.8 3.5   Recent Labs    10/22/21 0000  WBC 3.9  NEUTROABS 1,720.00  HGB 13.1*  HCT 40*  PLT 212   Lab Results  Component Value Date   TSH 3.12 10/22/2021   No results found for: "HGBA1C" Lab Results  Component Value Date   CHOL 133 10/22/2021   HDL 43 10/22/2021   LDLCALC 76 10/22/2021   TRIG 59 10/22/2021   CHOLHDL 2.6  07/10/2019    Significant Diagnostic Results in last 30 days:  No results found.  Assessment/Plan 1. Bilateral edema of lower extremity Will increase his lasix to 20 mg QD for 1 week and then back to 20 mg QOD Patient very worried about increase frequency  2. Paroxysmal atrial fibrillation (HCC) On Digoxin and Plavix Has Refused DOCA  3. Acquired hypothyroidism TSH normal in 04/23  4. Benign prostatic hyperplasia without lower urinary tract symptoms On  Avodart  5. Stage 3a chronic kidney disease (HCC) Creat stable  6. Gastroesophageal reflux disease without esophagitis On Protonix  7. Hyperlipidemia LDL goal <70 LDL 76  8. Oropharyngeal dysphagia Refuses Speech or Modified Diet 9 CAD On Imdur and Statin and Plavix 10 Skin Lesions Is going to see dermatology    Family/ staff Communication:   Labs/tests ordered:

## 2022-01-14 DIAGNOSIS — Z85820 Personal history of malignant melanoma of skin: Secondary | ICD-10-CM | POA: Diagnosis not present

## 2022-01-14 DIAGNOSIS — D485 Neoplasm of uncertain behavior of skin: Secondary | ICD-10-CM | POA: Diagnosis not present

## 2022-01-14 DIAGNOSIS — C44622 Squamous cell carcinoma of skin of right upper limb, including shoulder: Secondary | ICD-10-CM | POA: Diagnosis not present

## 2022-01-14 DIAGNOSIS — Z85828 Personal history of other malignant neoplasm of skin: Secondary | ICD-10-CM | POA: Diagnosis not present

## 2022-01-25 ENCOUNTER — Non-Acute Institutional Stay: Payer: Medicare Other | Admitting: Nurse Practitioner

## 2022-01-25 ENCOUNTER — Encounter: Payer: Self-pay | Admitting: Nurse Practitioner

## 2022-01-25 DIAGNOSIS — I739 Peripheral vascular disease, unspecified: Secondary | ICD-10-CM | POA: Diagnosis not present

## 2022-01-25 DIAGNOSIS — E039 Hypothyroidism, unspecified: Secondary | ICD-10-CM

## 2022-01-25 DIAGNOSIS — I209 Angina pectoris, unspecified: Secondary | ICD-10-CM | POA: Diagnosis not present

## 2022-01-25 DIAGNOSIS — E785 Hyperlipidemia, unspecified: Secondary | ICD-10-CM

## 2022-01-25 DIAGNOSIS — L89312 Pressure ulcer of right buttock, stage 2: Secondary | ICD-10-CM

## 2022-01-25 DIAGNOSIS — I480 Paroxysmal atrial fibrillation: Secondary | ICD-10-CM

## 2022-01-25 DIAGNOSIS — K219 Gastro-esophageal reflux disease without esophagitis: Secondary | ICD-10-CM

## 2022-01-25 DIAGNOSIS — N1831 Chronic kidney disease, stage 3a: Secondary | ICD-10-CM

## 2022-01-25 DIAGNOSIS — N400 Enlarged prostate without lower urinary tract symptoms: Secondary | ICD-10-CM | POA: Diagnosis not present

## 2022-01-25 NOTE — Assessment & Plan Note (Signed)
stable, on Levothyroxine 50mcg qd. TSH 3.12 10/22/21 

## 2022-01-25 NOTE — Assessment & Plan Note (Signed)
Stable, takes Plavix, Isosorbide

## 2022-01-25 NOTE — Assessment & Plan Note (Signed)
The right buttock pressure ulcer  is healed with a scar tissue, short lived pain in the area come and goes, positional

## 2022-01-25 NOTE — Assessment & Plan Note (Signed)
takes Pravastatin, LDL 76 10/22/21 

## 2022-01-25 NOTE — Assessment & Plan Note (Signed)
Chronic edema BLE, improved, on Furosemide

## 2022-01-25 NOTE — Assessment & Plan Note (Signed)
Stable,  takes Pantoprazole, Hgb 13.1 10/22/21 

## 2022-01-25 NOTE — Progress Notes (Signed)
Location:   [facility] Room Number: [place] of Service:  ALF [phone]) Provider:  Uday Jantz X, NP  Virgie Dad, MD  Patient Care Team: Virgie Dad, MD as PCP - General (Internal Medicine) Danella Sensing, MD as Consulting Physician (Dermatology) Zanae Kuehnle X, NP as Nurse Practitioner (Internal Medicine)  Extended Emergency Contact Information Primary Emergency Contact: Ponder,Bill Address: [address]          [place], [place] [address] Johnnette Litter of [place] Phone: [phone] Mobile Phone: [phone] Relation: Brother Secondary Emergency Contact: Rosaland [place] Mobile Phone: [phone] Relation: Relative  Code Status:  DNR Goals of care: Advanced Directive information    01/25/2022    3:07 PM  Advanced Directives  Does Patient Have a Medical Advance Directive? Yes  Type of Paramedic of [place];Out of facility DNR (pink MOST or yellow form)  Does patient want to make changes to medical advance directive? No - Patient declined  Copy of [place] in Chart? Yes - validated most recent copy scanned in chart (See row information)  Pre-existing out of facility DNR order (yellow form or pink MOST form) Yellow form placed in chart (order not valid for inpatient use)     Chief Complaint  Patient presents with   Medical Management of Chronic Issues    Routine follow up   Immunizations    Pneumonia vaccine and shingrix vaccine due    HPI:  Pt is a 88 y.o. male seen today for medical management of chronic diseases.   The right buttock pressure ulcer  is healed with a scar tissue, short lived pain in the area come and goes, positional              Chronic edema BLE, improved, on Furosemide             Hx of Afib, heart rate is in 50s, on Digoxin. Dig level 1.2 07/09/21, takes Plavix             Hypothyroidism, stable, on Levothyroxine 70mg qd. 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   Cervical spondylosis without myelopathy 08/22/2015   Diverticulosis    Diverticulosis of colon without hemorrhage 08/22/2015   Edema 08/18/2015   Fall 07/17/2015   Gait disturbance 08/22/2015   GERD (gastroesophageal reflux disease)    occurs rarely   History of CVA (cerebrovascular accident) 07/17/2015   HLD (hyperlipidemia) 07/17/2015   Hyperlipidemia    Hypertension    Inguinal hernia    Left carotid bruit    Melanoma ([facility]    left forearm   Osteopenia    Paroxysmal atrial fibrillation ([facility]  Physical deconditioning 08/22/2015   Retinal hemorrhage 04/24/2014   Rosacea blepharoconjunctivitis 11/20/2011   Stroke ([place]) 3/07   affected balance   Tubular adenoma of colon 2007   Weakness 07/17/2015   Past Surgical History:  Procedure Laterality Date   COLONOSCOPY  2008   EYE SURGERY Right    cataract extraction with IOL   INGUINAL HERNIA REPAIR Right 03/15/2013   Procedure: HERNIA REPAIR INGUINAL ADULT;  Surgeon: Odis Hollingshead, MD;  [facility];  Service: General;  Laterality: Right;   INSERTION OF MESH Right 03/15/2013   Procedure: INSERTION OF MESH;  Surgeon: Odis Hollingshead, MD;  [facility];  Service: General;  Laterality: Right;   left forearm melanoma     PROSTATE BIOPSY     SKIN SURGERY  08/04/2020   TONSILLECTOMY  1933    Allergies  Allergen Reactions   Aspirin    Eggs Or Egg-Derived Products Swelling    throat     Allergies as of 01/25/2022       Reactions   Aspirin    Eggs Or Egg-derived Products Swelling   throat        Medication List        Accurate as of January 25, 2022  4:04 PM. If you have any questions, ask your nurse or doctor.          STOP taking these medications    UNABLE TO FIND Stopped by: Karan Inclan X Elliemae Braman, NP       TAKE these medications    ammonium lactate 12 % lotion Commonly known as: LAC-HYDRIN Apply 1 application. topically at bedtime.   cholecalciferol 10 MCG (400 UNIT) Tabs tablet Commonly known as: VITAMIN D3 Take 400 Units by mouth 2 (two) times daily.   clopidogrel 75 MG tablet Commonly known as: PLAVIX Take 75 mg by mouth daily. Start PLAVIX from 07/24/15 if no further hematuria is reported by patient and HB > 12.   digoxin 0.125 MG tablet Commonly known as: LANOXIN Take 0.125 mg by mouth daily.   dutasteride 0.5 MG capsule Commonly known as: AVODART Take 0.5 mg by mouth daily.   furosemide 20 MG tablet Commonly known as: LASIX Take 20 mg by mouth every other day.   isosorbide mononitrate 30 MG 24 hr tablet Commonly known as: IMDUR Take 0.5 tablets (15 mg total) by mouth daily.   levothyroxine 50 MCG tablet Commonly known as: SYNTHROID Take 50 mcg by mouth daily before breakfast.   nitroGLYCERIN 0.4 MG SL tablet Commonly known as: NITROSTAT Place 1 tablet under the tongue as needed.   pantoprazole 40 MG tablet Commonly known as: PROTONIX Take 1 tablet (40 mg total) by mouth daily.   polyethylene glycol 17 g packet Commonly known as: MIRALAX / GLYCOLAX Take 17 g by mouth daily as needed.   POTASSIUM CHLORIDE ER PO Take 1 capsule by mouth every other day.   pravastatin 20 MG tablet Commonly known as: PRAVACHOL Take 1 tablet by mouth at bedtime.   sodium fluoride 1.1 % Crea dental cream Commonly known as: PREVIDENT 5000 PLUS Place 1 Application onto teeth every evening.        Review of Systems  Constitutional:   Negative for fatigue, fever and unexpected weight change.  HENT:  Positive for hearing loss and trouble swallowing. Negative for congestion.        Cough while eating.   Eyes:  Negative for visual disturbance.  Respiratory:  Negative for cough and shortness of breath.  Cardiovascular:  Positive for leg swelling. Negative for chest pain and palpitations.  Gastrointestinal:  Negative for abdominal pain and constipation.  Genitourinary:  Positive for frequency. Negative for dysuria and urgency.  Musculoskeletal:  Positive for arthralgias and gait problem.  Skin:  Negative for color change.       Right buttock scar tissue from healed previous pressure ulcer   Neurological:  Negative for speech difficulty, weakness and headaches.  Psychiatric/Behavioral:  Negative for behavioral problems and sleep disturbance. The patient is not nervous/anxious.     Immunization History  Administered Date(s) Administered   Moderna Sars-Covid-2 Vaccination 07/06/2019, 08/02/2019, 05/06/2020, 11/25/2020   PPD Test 07/21/2015, 08/04/2015   Tdap 07/16/2015   Unspecified SARS-COV-2 Vaccination 03/17/2021   Zoster Recombinat (Shingrix) 12/04/2013   Pertinent  Health Maintenance Due  Topic Date Due   INFLUENZA VACCINE  Discontinued      08/17/2019   10:13 AM 03/06/2020   11:12 AM 07/04/2020   11:15 AM 08/05/2020    2:48 PM 09/12/2020   10:19 AM  Fall Risk  Falls in the past year? 0 0 0 0 0  Was there an injury with Fall?    0   Fall Risk Category Calculator    0   Fall Risk Category    Low   Patient Fall Risk Level    Low fall risk    Functional Status Survey:    Vitals:   01/25/22 1503  BP: (!) 96/54  Pulse: 62  Resp: 16  Temp: (!) 97.2 F (36.2 C)  SpO2: 94%  Weight: 134 lb 3.2 oz (60.9 kg)  Height: '6\' 1"'$  (1.854 m)   Body mass index is 17.71 kg/m. Physical Exam Vitals and nursing note reviewed.  Constitutional:      Appearance: Normal appearance.  HENT:     Head: Normocephalic and  atraumatic.     Mouth/Throat:     Mouth: Mucous membranes are moist.  Eyes:     Extraocular Movements: Extraocular movements intact.     Conjunctiva/sclera: Conjunctivae normal.     Pupils: Pupils are equal, round, and reactive to light.  Cardiovascular:     Rate and Rhythm: Normal rate. Rhythm irregular.     Heart sounds: No murmur heard. Pulmonary:     Breath sounds: No rales.  Abdominal:     Palpations: Abdomen is soft.     Tenderness: There is no abdominal tenderness.     Comments: Irregular schedule BM, but no constipation.   Musculoskeletal:     Cervical back: Normal range of motion and neck supple.     Right lower leg: No edema.     Left lower leg: Edema present.     Comments: Trace edema LLE  Skin:    General: Skin is warm and dry.     Comments: Chronic venous insufficiency skin changes. Right buttock scar tissue from healed previous pressure ulcer. Lateral left lower leg skin lesion.    Neurological:     General: No focal deficit present.     Mental Status: He is alert and oriented to person, place, and time. Mental status is at baseline.     Gait: Gait abnormal.  Psychiatric:        Mood and Affect: Mood normal.        Behavior: Behavior normal.        Thought Content: Thought content normal.        Judgment: Judgment normal.     Labs reviewed: Recent Labs  03/20/21 0000 06/23/21 0000 10/22/21 0000  NA 140 139 141  K 4.0 4.4 4.5  CL 100 101 103  CO2 33* 33* 34*  BUN 37* 34* 29*  CREATININE 1.2 1.1 1.0  CALCIUM 9.5 9.8 9.4   Recent Labs    03/20/21 0000 10/22/21 0000  AST 13* 13*  ALT 8* 6*  ALKPHOS 65 66  ALBUMIN 3.8 3.5   Recent Labs    10/22/21 0000  WBC 3.9  NEUTROABS 1,720.00  HGB 13.1*  HCT 40*  PLT 212   Lab Results  Component Value Date   TSH 3.12 10/22/2021   No results found for: "HGBA1C" Lab Results  Component Value Date   CHOL 133 10/22/2021   HDL 43 10/22/2021   LDLCALC 76 10/22/2021   TRIG 59 10/22/2021   CHOLHDL  2.6 07/10/2019    Significant Diagnostic Results in last 30 days:  No results found.  Assessment/Plan Paroxysmal atrial fibrillation (HCC) heart rate is in 50s, on Digoxin. Dig level 1.2 07/09/21, takes Plavix  Acquired hypothyroidism stable, on Levothyroxine 58mg qd. TSH 3.12 10/22/21  BPH (benign prostatic hyperplasia)  stable, on Dutasteride 0.'5mg'$  qd  CKD (chronic kidney disease) stage 3, GFR 30-59 ml/min (HCC) Bun/cret 29/1.0 10/22/21  GERD (gastroesophageal reflux disease) Stable, takes Pantoprazole, Hgb 13.1 10/22/21  Hyperlipidemia LDL goal <70 takes Pravastatin, LDL 76 10/22/21  Angina pectoris (HCC) Stable, takes Plavix, Isosorbide  PVD (peripheral vascular disease) (HCC) Chronic edema BLE, improved, on Furosemide  Pressure ulcer of right buttock, stage 2 (HCC) The right buttock pressure ulcer  is healed with a scar tissue, short lived pain in the area come and goes, positional     Family/ staff Communication: plan of care reviewed with the patient and charge nurse.   Labs/tests ordered:  none  Time spend 40 minutes.

## 2022-01-25 NOTE — Assessment & Plan Note (Signed)
heart rate is in 50s, on Digoxin. Dig level 1.2 07/09/21, takes Plavix

## 2022-01-25 NOTE — Assessment & Plan Note (Signed)
stable, on Dutasteride 0.5mg qd 

## 2022-01-25 NOTE — Assessment & Plan Note (Signed)
Bun/cret 29/1.0 10/22/21 

## 2022-02-01 ENCOUNTER — Encounter: Payer: Self-pay | Admitting: Nurse Practitioner

## 2022-02-01 ENCOUNTER — Non-Acute Institutional Stay: Payer: Medicare Other | Admitting: Nurse Practitioner

## 2022-02-01 DIAGNOSIS — E785 Hyperlipidemia, unspecified: Secondary | ICD-10-CM | POA: Diagnosis not present

## 2022-02-01 DIAGNOSIS — I209 Angina pectoris, unspecified: Secondary | ICD-10-CM | POA: Diagnosis not present

## 2022-02-01 DIAGNOSIS — R609 Edema, unspecified: Secondary | ICD-10-CM

## 2022-02-01 DIAGNOSIS — K219 Gastro-esophageal reflux disease without esophagitis: Secondary | ICD-10-CM

## 2022-02-01 DIAGNOSIS — N1831 Chronic kidney disease, stage 3a: Secondary | ICD-10-CM

## 2022-02-01 DIAGNOSIS — N400 Enlarged prostate without lower urinary tract symptoms: Secondary | ICD-10-CM

## 2022-02-01 DIAGNOSIS — E039 Hypothyroidism, unspecified: Secondary | ICD-10-CM

## 2022-02-01 DIAGNOSIS — I480 Paroxysmal atrial fibrillation: Secondary | ICD-10-CM

## 2022-02-01 NOTE — Assessment & Plan Note (Signed)
No reported chest pain since last seen,  takes Plavix, Isosorbide

## 2022-02-01 NOTE — Assessment & Plan Note (Signed)
stable, on Levothyroxine 50mcg qd. TSH 3.12 10/22/21 

## 2022-02-01 NOTE — Progress Notes (Unsigned)
Location:  [place] Room Number: AL/813/A Place of Service:  ALF (13) Provider:  Patrisia Faeth X, NP Virgie Dad, MD  Patient Care Team: Virgie Dad, MD as PCP - General (Internal Medicine) Danella Sensing, MD as Consulting Physician (Dermatology) Anah Billard X, NP as Nurse Practitioner (Internal Medicine)  Extended Emergency Contact Information Primary Emergency Contact: Edell,Bill Address: [address]          [place], [place] [address] Johnnette Litter of [place] Phone: [phone] Mobile Phone: [phone] Relation: Brother Secondary Emergency Contact: Rosaland Lao [place] of [place] Mobile Phone: [phone] Relation: Relative  Code Status:  DNR Goals of care: Advanced Directive information    01/25/2022    3:07 PM  Advanced Directives  Does Patient Have a Medical Advance Directive? Yes  Type of Paramedic of [place];Out of facility DNR (pink MOST or yellow form)  Does patient want to make changes to medical advance directive? No - Patient declined  Copy of Story in Chart? Yes - validated most recent copy scanned in chart (See row information)  Pre-existing out of facility DNR order (yellow form or pink MOST form) Yellow form placed in chart (order not valid for inpatient use)     Chief Complaint  Patient presents with  . Acute Visit    Patient is being seen for edema of lower extremities    HPI:  Pt is a 88 y.o. male seen today for an acute visit for increased edema BLE, denied PND, cough, phlegm production, chest pain, palpitation, or O2 desaturation. Weight gained #136 today from #130 11/26/21   The right buttock pressure ulcer  is healed with a scar tissue, short lived pain in the area come and goes, positional             Chronic edema BLE, on Furosemide             Hx of Afib, heart rate is in control, on Digoxin. Dig level 1.2 07/09/21, takes Plavix              Hypothyroidism, stable, on Levothyroxine 3mg qd. TSH 3.12 10/22/21             Urinary frequency, stable, on Dutasteride 0.'5mg'$  qd             CKD Bun/cret 29/1.0 10/22/21             GERD takes Pantoprazole, Hgb 13.1 10/22/21             Hyperlipidemia, takes Pravastatin, LDL 76 10/22/21             CAD/angina pectoris, takes Plavix, Isosorbide      Past Medical History:  Diagnosis Date  . Anginal pain ([facility]    nuclear stress 03/12/13 EPIC  . Balance problem 07/17/2015  . Basal cell carcinoma    left forearm  . BPH (benign prostatic hyperplasia)   . Cataract, nuclear 08/08/2014  . 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 Cervical spondylosis without myelopathy 08/22/2015  . Diverticulosis   . Diverticulosis of colon without hemorrhage 08/22/2015  . Edema 08/18/2015  . Fall 07/17/2015  . Gait disturbance 08/22/2015  . GERD (gastroesophageal reflux disease)    occurs rarely  . History of CVA (cerebrovascular accident) 07/17/2015  . HLD (hyperlipidemia) 07/17/2015  . Hyperlipidemia   . Hypertension   . Inguinal hernia   . Left carotid bruit   . Melanoma ([facility]    left forearm  .  Osteopenia   . Paroxysmal atrial fibrillation (HCC)   . Physical deconditioning 08/22/2015  . Retinal hemorrhage 04/24/2014  . Rosacea blepharoconjunctivitis 11/20/2011  . Stroke ([place]) 3/07   affected balance  . Tubular adenoma of colon 2007  . Weakness 07/17/2015   Past Surgical History:  Procedure Laterality Date  . COLONOSCOPY  2008  . EYE SURGERY Right    cataract extraction with IOL  . INGUINAL HERNIA REPAIR Right 03/15/2013   Procedure: HERNIA REPAIR INGUINAL ADULT;  Surgeon: Odis Hollingshead, MD;  Location: [facility];  Service: General;  Laterality: Right;  . INSERTION OF MESH Right 03/15/2013   Procedure: INSERTION OF MESH;  Surgeon: Odis Hollingshead, MD;  Location: [facility];  Service: General;  Laterality: Right;  . left forearm melanoma    . PROSTATE BIOPSY    . SKIN SURGERY  08/04/2020  . TONSILLECTOMY   1933    Allergies  Allergen Reactions  . Aspirin   . Eggs Or Egg-Derived Products Swelling    throat    Outpatient Encounter Medications as of 02/01/2022  Medication Sig  . ammonium lactate (LAC-HYDRIN) 12 % lotion Apply 1 application. topically at bedtime.  . cholecalciferol (VITAMIN D) 400 units TABS tablet Take 400 Units by mouth 2 (two) times daily.  . clopidogrel (PLAVIX) 75 MG tablet Take 75 mg by mouth daily. Start PLAVIX from 07/24/15 if no further hematuria is reported by patient and HB > 12.  . digoxin (LANOXIN) 0.125 MG tablet Take 0.125 mg by mouth daily.  Marland Kitchen dutasteride (AVODART) 0.5 MG capsule Take 0.5 mg by mouth daily.   . furosemide (LASIX) 20 MG tablet Take 20 mg by mouth every other day.  . isosorbide mononitrate (IMDUR) 30 MG 24 hr tablet Take 0.5 tablets (15 mg total) by mouth daily.  Marland Kitchen levothyroxine (SYNTHROID, LEVOTHROID) 50 MCG tablet Take 50 mcg by mouth daily before breakfast.  . nitroGLYCERIN (NITROSTAT) 0.4 MG SL tablet Place 1 tablet under the tongue as needed.  . pantoprazole (PROTONIX) 40 MG tablet Take 1 tablet (40 mg total) by mouth daily.  . polyethylene glycol (MIRALAX / GLYCOLAX) 17 g packet Take 17 g by mouth daily as needed.  Marland Kitchen POTASSIUM CHLORIDE ER PO Take 1 capsule by mouth every other day.  . pravastatin (PRAVACHOL) 20 MG tablet Take 1 tablet by mouth at bedtime.  . sodium fluoride (PREVIDENT 5000 PLUS) 1.1 % CREA dental cream Place 1 Application onto teeth every evening.   No facility-administered encounter medications on file as of 02/01/2022.    Review of Systems  Constitutional:  Negative for fatigue, fever and unexpected weight change.  HENT:  Positive for hearing loss and trouble swallowing. Negative for congestion.        Cough while eating.   Eyes:  Negative for visual disturbance.  Respiratory:  Negative for cough and shortness of breath.   Cardiovascular:  Positive for leg swelling. Negative for chest pain and palpitations.   Gastrointestinal:  Negative for abdominal pain and constipation.  Genitourinary:  Positive for frequency. Negative for dysuria and urgency.  Musculoskeletal:  Positive for arthralgias and gait problem.  Skin:  Negative for color change.       Right buttock scar tissue from healed previous pressure ulcer   Neurological:  Negative for speech difficulty, weakness and headaches.  Psychiatric/Behavioral:  Negative for behavioral problems and sleep disturbance. The patient is not nervous/anxious.     Immunization History  Administered Date(s) Administered  . Moderna Sars-Covid-2 Vaccination 07/06/2019,  08/02/2019, 05/06/2020, 11/25/2020  . PPD Test 07/21/2015, 08/04/2015  . Tdap 07/16/2015  . Unspecified SARS-COV-2 Vaccination 03/17/2021  . Zoster Recombinat (Shingrix) 12/04/2013   Pertinent  Health Maintenance Due  Topic Date Due  . INFLUENZA VACCINE  Discontinued      08/17/2019   10:13 AM 03/06/2020   11:12 AM 07/04/2020   11:15 AM 08/05/2020    2:48 PM 09/12/2020   10:19 AM  Fall Risk  Falls in the past year? 0 0 0 0 0  Was there an injury with Fall?    0   Fall Risk Category Calculator    0   Fall Risk Category    Low   Patient Fall Risk Level    Low fall risk    Functional Status Survey:    Vitals:   02/01/22 1107  BP: 138/84  Pulse: 66  Resp: 18  Temp: 98.1 F (36.7 C)  SpO2: 96%  Weight: 135 lb 9.6 oz (61.5 kg)  Height: '6\' 1"'$  (1.854 m)   Body mass index is 17.89 kg/m. Physical Exam Vitals and nursing note reviewed.  Constitutional:      Appearance: Normal appearance.  HENT:     Head: Normocephalic and atraumatic.     Mouth/Throat:     Mouth: Mucous membranes are moist.  Eyes:     Extraocular Movements: Extraocular movements intact.     Conjunctiva/sclera: Conjunctivae normal.     Pupils: Pupils are equal, round, and reactive to light.  Cardiovascular:     Rate and Rhythm: Normal rate. Rhythm irregular.     Heart sounds: No murmur heard. Pulmonary:      Effort: Pulmonary effort is normal.     Breath sounds: Rales present.     Comments: Bibasilar rales.  Abdominal:     Palpations: Abdomen is soft.     Tenderness: There is no abdominal tenderness.     Comments: Irregular schedule BM, but no constipation.   Musculoskeletal:     Cervical back: Normal range of motion and neck supple.     Right lower leg: Edema present.     Left lower leg: Edema present.     Comments: 2+edema BLE  Skin:    General: Skin is warm and dry.     Comments: Chronic venous insufficiency skin changes. Right buttock scar tissue from healed previous pressure ulcer. Lateral left lower leg skin lesion.    Neurological:     General: No focal deficit present.     Mental Status: He is alert and oriented to person, place, and time. Mental status is at baseline.     Gait: Gait abnormal.  Psychiatric:        Mood and Affect: Mood normal.        Behavior: Behavior normal.        Thought Content: Thought content normal.        Judgment: Judgment normal.    Labs reviewed: Recent Labs    03/20/21 0000 06/23/21 0000 10/22/21 0000  NA 140 139 141  K 4.0 4.4 4.5  CL 100 101 103  CO2 33* 33* 34*  BUN 37* 34* 29*  CREATININE 1.2 1.1 1.0  CALCIUM 9.5 9.8 9.4   Recent Labs    03/20/21 0000 10/22/21 0000  AST 13* 13*  ALT 8* 6*  ALKPHOS 65 66  ALBUMIN 3.8 3.5   Recent Labs    10/22/21 0000  WBC 3.9  NEUTROABS 1,720.00  HGB 13.1*  HCT 40*  PLT 212  Lab Results  Component Value Date   TSH 3.12 10/22/2021   No results found for: "HGBA1C" Lab Results  Component Value Date   CHOL 133 10/22/2021   HDL 43 10/22/2021   LDLCALC 76 10/22/2021   TRIG 59 10/22/2021   CHOLHDL 2.6 07/10/2019    Significant Diagnostic Results in last 30 days:  No results found.  Assessment/Plan Edema  increased edema BLE, denied PND, cough, phlegm production, chest pain, palpitation, or O2 desaturation. Weight gained #136 today from #130 11/26/21 Will increase Furosemide  '20mg'$ , Kcl 72mq daily x 1week, then resume qod, BMP one week.   Paroxysmal atrial fibrillation (HCC) heart rate is in control, on Digoxin. Dig level 1.2 07/09/21, takes Plavix  Acquired hypothyroidism stable, on Levothyroxine 541m qd. TSH 3.12 10/22/21  BPH (benign prostatic hyperplasia) stable, on Dutasteride 0.'5mg'$  qd  CKD (chronic kidney disease) stage 3, GFR 30-59 ml/min (HCC) Bun/cret 29/1.0 10/22/21  GERD (gastroesophageal reflux disease)  takes Pantoprazole, Hgb 13.1 10/22/21  Hyperlipidemia LDL goal <70 takes Pravastatin, LDL 76 10/22/21  [place]ina pectoris [place] [place] pain since last seen,  takes Plavix, Isosorbide     Family/ staff Communication: plan of care reviewed with the patient and charge nurse.   Labs/tests ordered:  BMP one week  Time spend 40 minutes.

## 2022-02-01 NOTE — Assessment & Plan Note (Signed)
increased edema BLE, denied PND, cough, phlegm production, chest pain, palpitation, or O2 desaturation. Weight gained #136 today from #130 11/26/21 Will increase Furosemide '20mg'$ , Kcl 28mq daily x 1week, then resume qod, BMP one week.

## 2022-02-01 NOTE — Assessment & Plan Note (Signed)
heart rate is in control, on Digoxin. Dig level 1.2 07/09/21, takes Plavix 

## 2022-02-01 NOTE — Assessment & Plan Note (Signed)
stable, on Dutasteride 0.5mg qd 

## 2022-02-01 NOTE — Assessment & Plan Note (Signed)
takes Pantoprazole, Hgb 13.1 10/22/21 

## 2022-02-01 NOTE — Assessment & Plan Note (Signed)
takes Pravastatin, LDL 76 10/22/21 

## 2022-02-01 NOTE — Assessment & Plan Note (Signed)
Bun/cret 29/1.0 10/22/21 

## 2022-02-02 ENCOUNTER — Encounter: Payer: Self-pay | Admitting: Nurse Practitioner

## 2022-02-09 DIAGNOSIS — R609 Edema, unspecified: Secondary | ICD-10-CM | POA: Diagnosis not present

## 2022-02-18 DIAGNOSIS — L570 Actinic keratosis: Secondary | ICD-10-CM | POA: Diagnosis not present

## 2022-02-18 DIAGNOSIS — Z85820 Personal history of malignant melanoma of skin: Secondary | ICD-10-CM | POA: Diagnosis not present

## 2022-02-18 DIAGNOSIS — L821 Other seborrheic keratosis: Secondary | ICD-10-CM | POA: Diagnosis not present

## 2022-02-18 DIAGNOSIS — D485 Neoplasm of uncertain behavior of skin: Secondary | ICD-10-CM | POA: Diagnosis not present

## 2022-02-18 DIAGNOSIS — Z85828 Personal history of other malignant neoplasm of skin: Secondary | ICD-10-CM | POA: Diagnosis not present

## 2022-02-18 DIAGNOSIS — C44319 Basal cell carcinoma of skin of other parts of face: Secondary | ICD-10-CM | POA: Diagnosis not present

## 2022-02-25 ENCOUNTER — Encounter: Payer: Self-pay | Admitting: Nurse Practitioner

## 2022-02-25 ENCOUNTER — Non-Acute Institutional Stay: Payer: Medicare Other | Admitting: Nurse Practitioner

## 2022-02-25 DIAGNOSIS — R0602 Shortness of breath: Secondary | ICD-10-CM | POA: Diagnosis not present

## 2022-02-25 DIAGNOSIS — R0789 Other chest pain: Secondary | ICD-10-CM | POA: Diagnosis not present

## 2022-02-25 DIAGNOSIS — I209 Angina pectoris, unspecified: Secondary | ICD-10-CM

## 2022-02-25 DIAGNOSIS — N400 Enlarged prostate without lower urinary tract symptoms: Secondary | ICD-10-CM

## 2022-02-25 DIAGNOSIS — I480 Paroxysmal atrial fibrillation: Secondary | ICD-10-CM | POA: Diagnosis not present

## 2022-02-25 DIAGNOSIS — K219 Gastro-esophageal reflux disease without esophagitis: Secondary | ICD-10-CM | POA: Diagnosis not present

## 2022-02-25 DIAGNOSIS — L89312 Pressure ulcer of right buttock, stage 2: Secondary | ICD-10-CM | POA: Diagnosis not present

## 2022-02-25 DIAGNOSIS — R609 Edema, unspecified: Secondary | ICD-10-CM | POA: Diagnosis not present

## 2022-02-25 DIAGNOSIS — E039 Hypothyroidism, unspecified: Secondary | ICD-10-CM

## 2022-02-25 DIAGNOSIS — E785 Hyperlipidemia, unspecified: Secondary | ICD-10-CM | POA: Diagnosis not present

## 2022-02-25 DIAGNOSIS — N1831 Chronic kidney disease, stage 3a: Secondary | ICD-10-CM | POA: Diagnosis not present

## 2022-02-25 NOTE — Assessment & Plan Note (Signed)
heart rate is in control, on Digoxin. Dig level 1.2 07/09/21, takes Plavix 

## 2022-02-25 NOTE — Assessment & Plan Note (Signed)
The right buttock pressure ulcer  is healed with a scar tissue, short lived pain in the area come and goes, positional

## 2022-02-25 NOTE — Assessment & Plan Note (Addendum)
c/o front lower chest R+L pain upon getting up this morning, lasted about 30 minutes, resolved without intervention, associated with breathing, not body movement. Denied palpitation, SOB, diaphoresis, sense of impending doom, he is afebrile, no O2 desaturation. Hx of angina, but the patient stated it's not what he experienced it in the past.  Will obtain CXR ap/lateral to evaluate further-02/26/22 CXR no acute cardiopulmonary disease Monitor s/s of angina.

## 2022-02-25 NOTE — Assessment & Plan Note (Signed)
CAD/angina pectoris, takes Plavix, Isosorbide

## 2022-02-25 NOTE — Assessment & Plan Note (Signed)
on Furosemide, minimal swelling.

## 2022-02-25 NOTE — Assessment & Plan Note (Signed)
Bun/cret 29/1.0 10/22/21 

## 2022-02-25 NOTE — Assessment & Plan Note (Signed)
takes Pravastatin, LDL 76 10/22/21 

## 2022-02-25 NOTE — Assessment & Plan Note (Signed)
stable, on Levothyroxine 50mcg qd. TSH 3.12 10/22/21 

## 2022-02-25 NOTE — Progress Notes (Signed)
Location:   [facility] [place] Room Number: AL/813/A Place of Service:  ALF (13) Provider: Lennie Odor Altan Kraai NP  Virgie Dad, MD  Patient Care Team: Virgie Dad, MD as PCP - General (Internal Medicine) Danella Sensing, MD as Consulting Physician (Dermatology) Kupono Marling X, NP as Nurse Practitioner (Internal Medicine)  Extended Emergency Contact Information Primary Emergency Contact: Klaiber,Bill Address: [address]          [place], [place] [address] Johnnette Litter of [place] Phone: [phone] Mobile Phone: [phone] Relation: Brother Secondary Emergency Contact: Rosaland Lao [place] of [place] Mobile Phone: [phone] Relation: Relative  Code Status: DNR Goals of care: Advanced Directive information    01/25/2022    3:07 PM  Advanced Directives  Does Patient Have a Medical Advance Directive? Yes  Type of Paramedic of [place];Out of facility DNR (pink MOST or yellow form)  Does patient want to make changes to medical advance directive? No - Patient declined  Copy of [place] in Chart? Yes - validated most recent copy scanned in chart (See row information)  Pre-existing out of facility DNR order (yellow form or pink MOST form) Yellow form placed in chart (order not valid for inpatient use)     Chief Complaint  Patient presents with   Acute Visit    Patient is being seen for chest pain    HPI:  Pt is a 88 y.o. male seen today for an acute visit for c/o front lower chest R+L pain upon getting up this morning, lasted about 30 minutes, resolved without intervention, associated with breathing, not body movement. Denied palpitation, SOB, diaphoresis, sense of impending doom, he is afebrile, no O2 desaturation. Hx of angina, but the patient stated it's not what he experienced it in the past.     Edema BLE, on Furosemide, minimal swelling.              The right buttock pressure ulcer  is healed with a scar  tissue, short lived pain in the area come and goes, positional             Hx of Afib, heart rate is in control, on Digoxin. Dig level 1.2 07/09/21, takes Plavix             Hypothyroidism, stable, on Levothyroxine 29mg qd. 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   Cervical spondylosis without myelopathy 08/22/2015   Diverticulosis    Diverticulosis of colon without hemorrhage 08/22/2015   Edema 08/18/2015   Fall 07/17/2015   Gait disturbance 08/22/2015   GERD (gastroesophageal reflux disease)    occurs rarely   History of CVA (cerebrovascular accident) 07/17/2015   HLD (hyperlipidemia) 07/17/2015  Hyperlipidemia    Hypertension    Inguinal hernia    Left carotid bruit    Melanoma (HCC)    left forearm   Osteopenia    Paroxysmal atrial fibrillation ([place])    Physical deconditioning 08/22/2015   Retinal hemorrhage 04/24/2014   Rosacea blepharoconjunctivitis 11/20/2011   Stroke ([place]) 3/07   affected balance   Tubular adenoma of colon 2007   Weakness 07/17/2015   Past Surgical History:  Procedure Laterality Date   COLONOSCOPY  2008   EYE SURGERY Right    cataract extraction with IOL   INGUINAL HERNIA REPAIR Right 03/15/2013   Procedure: HERNIA REPAIR INGUINAL ADULT;  Surgeon: Odis Hollingshead, MD;  Location: [facility];  Service: General;  Laterality: Right;   INSERTION OF MESH Right 03/15/2013   Procedure: INSERTION OF MESH;  Surgeon: Odis Hollingshead, MD;  Location: [facility];  Service: General;  Laterality: Right;   left forearm melanoma     PROSTATE BIOPSY     SKIN SURGERY  08/04/2020   TONSILLECTOMY  1933    Allergies  Allergen Reactions   Aspirin    Eggs Or Egg-Derived Products Swelling    throat    Allergies as of 02/25/2022       Reactions   Aspirin    Eggs Or Egg-derived Products Swelling   throat        Medication List        Accurate as of February 25, 2022 11:59 PM. If you have any questions, ask your nurse or doctor.          ammonium lactate 12 % lotion Commonly known as: LAC-HYDRIN Apply 1 application. topically at bedtime.   cholecalciferol 10 MCG (400 UNIT) Tabs tablet Commonly known as: VITAMIN D3 Take 400 Units by mouth 2 (two) times daily.   clopidogrel 75 MG tablet Commonly known as: PLAVIX Take 75 mg by mouth daily. Start PLAVIX from 07/24/15 if no further hematuria is reported by patient and HB > 12.   digoxin 0.125 MG tablet Commonly known as: LANOXIN Take 0.125 mg by mouth daily.   dutasteride 0.5 MG capsule Commonly known as: AVODART Take 0.5 mg by mouth daily.   furosemide 20 MG tablet Commonly known as: LASIX Take 20 mg by mouth every other day.   isosorbide mononitrate 30 MG 24 hr tablet Commonly known as: IMDUR Take 0.5 tablets (15 mg total) by mouth daily.   levothyroxine 50 MCG tablet Commonly known as: SYNTHROID Take 50 mcg by mouth daily before breakfast.   nitroGLYCERIN 0.4 MG SL tablet Commonly known as: NITROSTAT Place 1 tablet under the tongue as needed.   pantoprazole 40 MG tablet Commonly known as: PROTONIX Take 1 tablet (40 mg total) by mouth daily.   polyethylene glycol 17 g packet Commonly known as: MIRALAX / GLYCOLAX Take 17 g by mouth daily as needed.   POTASSIUM CHLORIDE ER PO Take 1 capsule by mouth every other day.   pravastatin 20 MG tablet Commonly known as: PRAVACHOL Take 1 tablet by mouth at bedtime.   sodium fluoride 1.1 % Crea dental cream Commonly  known as: PREVIDENT 5000 PLUS Place 1 Application onto teeth every evening.        Review of Systems  Constitutional:  Negative for fatigue, fever and unexpected weight change.  HENT:  Positive for hearing loss and trouble swallowing. Negative for congestion.        Cough while eating.   Eyes:  Negative for visual disturbance.  Respiratory:  Negative for cough, chest tightness, shortness of breath and wheezing.   Cardiovascular:  Positive for chest pain and leg swelling. Negative for palpitations.  Gastrointestinal:  Negative for abdominal pain and constipation.  Genitourinary:  Positive for frequency. Negative for dysuria and urgency.  Musculoskeletal:  Positive for arthralgias and gait problem.  Skin:  Negative for color change.       Right buttock scar tissue from healed previous pressure ulcer   Neurological:  Negative for speech difficulty, weakness and headaches.  Psychiatric/Behavioral:  Negative for behavioral problems and sleep disturbance. The patient is not nervous/anxious.     Immunization History  Administered Date(s) Administered   Moderna Sars-Covid-2 Vaccination 07/06/2019, 08/02/2019, 05/06/2020, 11/25/2020   PPD Test 07/21/2015, 08/04/2015   Tdap 07/16/2015   Unspecified SARS-COV-2 Vaccination 03/17/2021   Zoster Recombinat (Shingrix) 12/04/2013   Pertinent  Health Maintenance Due  Topic Date Due   INFLUENZA VACCINE  Discontinued      08/17/2019   10:13 AM 03/06/2020   11:12 AM 07/04/2020   11:15 AM 08/05/2020    2:48 PM 09/12/2020   10:19 AM  Fall Risk  Falls in the past year? 0 0 0 0 0  Was there an injury with Fall?    0   Fall Risk Category Calculator    0   Fall Risk Category    Low   Patient Fall Risk Level    Low fall risk    Functional Status Survey:    Vitals:   02/25/22 1628  BP: 122/60  Pulse: 75  Resp: 18  Temp: 98 F (36.7 C)  SpO2: 94%  Weight: 135 lb (61.2 kg)  Height: '6\' 1"'$  (1.854 m)   Body mass index is 17.81 kg/m. Physical  Exam Vitals and nursing note reviewed.  Constitutional:      Appearance: Normal appearance.  HENT:     Head: Normocephalic and atraumatic.     Mouth/Throat:     Mouth: Mucous membranes are moist.  Eyes:     Extraocular Movements: Extraocular movements intact.     Conjunctiva/sclera: Conjunctivae normal.     Pupils: Pupils are equal, round, and reactive to light.  Cardiovascular:     Rate and Rhythm: Normal rate. Rhythm irregular.     Heart sounds: No murmur heard. Pulmonary:     Effort: Pulmonary effort is normal.     Breath sounds: Rales present.     Comments: Bibasilar rales, L>R Abdominal:     Palpations: Abdomen is soft.     Tenderness: There is no abdominal tenderness.     Comments: Irregular schedule BM, but no constipation.   Musculoskeletal:     Cervical back: Normal range of motion and neck supple.     Right lower leg: Edema present.     Left lower leg: Edema present.     Comments: 2+edema BLE  Skin:    General: Skin is warm and dry.     Comments: Chronic venous insufficiency skin changes. Right buttock scar tissue from healed previous pressure ulcer. Lateral left lower leg skin lesion.    Neurological:     General: No focal deficit present.     Mental Status: He is alert and oriented to person, place, and time. Mental status is at baseline.     Gait: Gait abnormal.  Psychiatric:        Mood and Affect: Mood normal.        Behavior: Behavior normal.  Thought Content: Thought content normal.        Judgment: Judgment normal.     Labs reviewed: Recent Labs    03/20/21 0000 06/23/21 0000 10/22/21 0000  NA 140 139 141  K 4.0 4.4 4.5  CL 100 101 103  CO2 33* 33* 34*  BUN 37* 34* 29*  CREATININE 1.2 1.1 1.0  CALCIUM 9.5 9.8 9.4   Recent Labs    03/20/21 0000 10/22/21 0000  AST 13* 13*  ALT 8* 6*  ALKPHOS 65 66  ALBUMIN 3.8 3.5   Recent Labs    10/22/21 0000  WBC 3.9  NEUTROABS 1,720.00  HGB 13.1*  HCT 40*  PLT 212   Lab Results   Component Value Date   TSH 3.12 10/22/2021   No results found for: "HGBA1C" Lab Results  Component Value Date   CHOL 133 10/22/2021   HDL 43 10/22/2021   LDLCALC 76 10/22/2021   TRIG 59 10/22/2021   CHOLHDL 2.6 07/10/2019    Significant Diagnostic Results in last 30 days:  No results found.  Assessment/Plan: Atypical chest pain c/o front lower chest R+L pain upon getting up this morning, lasted about 30 minutes, resolved without intervention, associated with breathing, not body movement. Denied palpitation, SOB, diaphoresis, sense of impending doom, he is afebrile, no O2 desaturation. Hx of angina, but the patient stated it's not what he experienced it in the past.  Will obtain CXR ap/lateral to evaluate further-02/26/22 CXR no acute cardiopulmonary disease Monitor s/s of angina.   Edema on Furosemide, minimal swelling.   Pressure ulcer of right buttock, stage 2 (HCC) The right buttock pressure ulcer  is healed with a scar tissue, short lived pain in the area come and goes, positional  Paroxysmal atrial fibrillation ([place])  heart rate is in control, on Digoxin. Dig level 1.2 07/09/21, takes Plavix  Acquired hypothyroidism stable, on Levothyroxine 35mg qd. TSH 3.12 10/22/21  BPH (benign prostatic hyperplasia) stable, on Dutasteride 0.'5mg'$  qd  CKD (chronic kidney disease) stage 3, GFR 30-59 ml/min (HCC) Bun/cret 29/1.0 10/22/21  GERD (gastroesophageal reflux disease)  takes Pantoprazole, Hgb 13.1 10/22/21  Hyperlipidemia LDL goal <70  takes Pravastatin, LDL 76 10/22/21  Angina pectoris ([facility]  CAD/angina pectoris, takes Plavix, Isosorbide    Family/ staff Communication: plan of care reviewed with the patient and charge nurse.   Labs/tests ordered: CXR ap/lateral.   Time spend 40 minutes.

## 2022-02-25 NOTE — Assessment & Plan Note (Signed)
takes Pantoprazole, Hgb 13.1 10/22/21 

## 2022-02-25 NOTE — Assessment & Plan Note (Signed)
stable, on Dutasteride 0.5mg qd 

## 2022-02-26 ENCOUNTER — Encounter: Payer: Self-pay | Admitting: Nurse Practitioner

## 2022-03-30 ENCOUNTER — Non-Acute Institutional Stay: Payer: Medicare Other | Admitting: Family Medicine

## 2022-03-30 ENCOUNTER — Encounter: Payer: Self-pay | Admitting: Family Medicine

## 2022-03-30 DIAGNOSIS — I209 Angina pectoris, unspecified: Secondary | ICD-10-CM

## 2022-03-30 DIAGNOSIS — I10 Essential (primary) hypertension: Secondary | ICD-10-CM | POA: Diagnosis not present

## 2022-03-30 DIAGNOSIS — E039 Hypothyroidism, unspecified: Secondary | ICD-10-CM | POA: Diagnosis not present

## 2022-03-30 DIAGNOSIS — N1831 Chronic kidney disease, stage 3a: Secondary | ICD-10-CM | POA: Diagnosis not present

## 2022-03-30 NOTE — Progress Notes (Signed)
Location:      Place of Service:     Provider: Alain Honey MD  Code Status:  Goals of Care:     01/25/2022    3:07 PM  Advanced Directives  Does Patient Have a Medical Advance Directive? Yes  Type of Paramedic of [place];Out of facility DNR (pink MOST or yellow form)  Does patient want to make changes to medical advance directive? No - Patient declined  Copy of [place] in Chart? Yes - validated most recent copy scanned in chart (See row information)  Pre-existing out of facility DNR order (yellow form or pink MOST form) Yellow form placed in chart (order not valid for inpatient use)     No chief complaint on file.   HPI: PatieDid agree to quality rather than quanity of lifent is a 88 y.o. male seen today for medical management of chronic diseases, including hypothyroidism, angina pectoris, CKD, dysphagia,hypertension. No new problems today. Seems intelligent but content to keep to himself and not relate to other residents. Impression is he would rather die than live current existence. He does C/O diminished voice but also has dysphagia. Has occasional 'mild" chest pain. There is some edema; no SOB Over-all not very communicative but did become more animated when we talked some politics.     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   Cervical spondylosis without myelopathy 08/22/2015   Diverticulosis    Diverticulosis of colon without hemorrhage 08/22/2015   Edema 08/18/2015   Fall 07/17/2015   Gait disturbance 08/22/2015   GERD (gastroesophageal reflux disease)    occurs rarely   History of CVA (cerebrovascular accident) 07/17/2015   HLD (hyperlipidemia) 07/17/2015   Hyperlipidemia    Hypertension     Inguinal hernia    Left carotid bruit    Melanoma ([place])    left forearm   Osteopenia    Paroxysmal atrial fibrillation ([place])    Physical deconditioning 08/22/2015   Retinal hemorrhage 04/24/2014   Rosacea blepharoconjunctivitis 11/20/2011   Stroke ([place]) 3/07   affected balance   Tubular adenoma of colon 2007   Weakness 07/17/2015    Past Surgical History:  Procedure Laterality Date   COLONOSCOPY  2008   EYE SURGERY Right    cataract extraction with IOL   INGUINAL HERNIA REPAIR Right 03/15/2013   Procedure: HERNIA REPAIR INGUINAL ADULT;  Surgeon: Odis Hollingshead, MD;  Location: [facility];  Service: General;  Laterality: Right;   INSERTION OF MESH Right 03/15/2013   Procedure: INSERTION OF MESH;  Surgeon: Odis Hollingshead, MD;  Location: [facility];  Service: General;  Laterality: Right;   left forearm melanoma     PROSTATE BIOPSY     SKIN SURGERY  08/04/2020   TONSILLECTOMY  1933    Allergies  Allergen Reactions   Aspirin    Eggs Or Egg-Derived Products Swelling    throat    Outpatient Encounter Medications as of 03/30/2022  Medication Sig   ammonium lactate (LAC-HYDRIN) 12 % lotion Apply 1 application. topically at bedtime.   cholecalciferol (VITAMIN D) 400 units TABS tablet Take 400 Units by mouth 2 (two) times daily.   clopidogrel (PLAVIX) 75 MG tablet Take 75 mg by mouth daily.  Start PLAVIX from 07/24/15 if no further hematuria is reported by patient and HB > 12.   digoxin (LANOXIN) 0.125 MG tablet Take 0.125 mg by mouth daily.   dutasteride (AVODART) 0.5 MG capsule Take 0.5 mg by mouth daily.    furosemide (LASIX) 20 MG tablet Take 20 mg by mouth every other day.   isosorbide mononitrate (IMDUR) 30 MG 24 hr tablet Take 0.5 tablets (15 mg total) by mouth daily.   levothyroxine (SYNTHROID, LEVOTHROID) 50 MCG tablet Take 50 mcg by mouth daily before breakfast.   nitroGLYCERIN (NITROSTAT) 0.4 MG SL tablet Place 1 tablet under the tongue as needed.   pantoprazole (PROTONIX) 40 MG  tablet Take 1 tablet (40 mg total) by mouth daily.   polyethylene glycol (MIRALAX / GLYCOLAX) 17 g packet Take 17 g by mouth daily as needed.   POTASSIUM CHLORIDE ER PO Take 1 capsule by mouth every other day.   pravastatin (PRAVACHOL) 20 MG tablet Take 1 tablet by mouth at bedtime.   sodium fluoride (PREVIDENT 5000 PLUS) 1.1 % CREA dental cream Place 1 Application onto teeth every evening.   No facility-administered encounter medications on file as of 03/30/2022.    Review of Systems:  Review of Systems  Constitutional: Negative.   HENT: Negative.    Respiratory:  Positive for chest tightness.   Cardiovascular:  Positive for chest pain and leg swelling.  Gastrointestinal: Negative.   Musculoskeletal:  Positive for gait problem.  Psychiatric/Behavioral:  Negative for suicidal ideas.   All other systems reviewed and are negative.   Health Maintenance  Topic Date Due   Pneumonia Vaccine 7+ Years old (1 - PCV) Never done   Zoster Vaccines- Shingrix (2 of 2) 01/29/2014   COVID-19 Vaccine (6 - Moderna risk series) 05/19/2022 (Originally 05/12/2021)   TETANUS/TDAP  07/15/2025   HPV VACCINES  Aged Out   INFLUENZA VACCINE  Discontinued    Physical Exam: There were no vitals filed for this visit. There is no height or weight on file to calculate BMI. Physical Exam Vitals and nursing note reviewed.  Constitutional:      Appearance: Normal appearance.  HENT:     Mouth/Throat:     Mouth: Mucous membranes are moist.     Pharynx: Oropharynx is clear.  Eyes:     Pupils: Pupils are equal, round, and reactive to light.  Cardiovascular:     Rate and Rhythm: Normal rate and regular rhythm.  Pulmonary:     Effort: Pulmonary effort is normal.     Breath sounds: Normal breath sounds.  Abdominal:     General: Abdomen is flat. Bowel sounds are normal.  Musculoskeletal:     Cervical back: Normal range of motion.     Right lower leg: Edema present.     Left lower leg: Edema present.   Skin:    General: Skin is warm.  Neurological:     General: No focal deficit present.     Mental Status: He is alert and oriented to person, place, and time.     Gait: Gait abnormal.  Psychiatric:        Mood and Affect: Mood normal.        Behavior: Behavior normal.     Labs reviewed: Basic Metabolic Panel: Recent Labs    06/23/21 0000 10/22/21 0000  NA 139 141  K 4.4 4.5  CL 101 103  CO2 33* 34*  BUN 34* 29*  CREATININE 1.1 1.0  CALCIUM 9.8 9.4  TSH  --  3.12   Liver Function Tests: Recent Labs    10/22/21 0000  AST 13*  ALT 6*  ALKPHOS 66  ALBUMIN 3.5   No results for input(s): "LIPASE", "AMYLASE" in the last 8760 hours. No results for input(s): "AMMONIA" in the last 8760 hours. CBC: Recent Labs    10/22/21 0000  WBC 3.9  NEUTROABS 1,720.00  HGB 13.1*  HCT 40*  PLT 212   Lipid Panel: Recent Labs    10/22/21 0000  CHOL 133  HDL 43  LDLCALC 76  TRIG 59   No results found for: "HGBA1C"  Procedures since last visit: No results found.  Assessment/Plan 1. Acquired hypothyroidism Takes thyroid supplement.  Most recent TSH was 3.27  2. Stage 3a chronic kidney disease (HCC) Creatinine was 1.1 on April 27  3. Primary hypertension Blood pressures currently well controlled.  Today 122/60.  Was placed on Lasix recently for dependent edema  Labs/tests ordered:  none  Lillette Boxer. Sabra Heck, [place] [address] [place], [place] Office [phone]

## 2022-03-31 NOTE — Addendum Note (Signed)
Addended by: Wardell Honour on: 03/31/2022 02:38 PM   Modules accepted: Level of Service

## 2022-04-16 ENCOUNTER — Encounter: Payer: Self-pay | Admitting: Nurse Practitioner

## 2022-04-16 ENCOUNTER — Non-Acute Institutional Stay: Payer: Medicare Other | Admitting: Nurse Practitioner

## 2022-04-16 DIAGNOSIS — B372 Candidiasis of skin and nail: Secondary | ICD-10-CM

## 2022-04-16 DIAGNOSIS — E785 Hyperlipidemia, unspecified: Secondary | ICD-10-CM | POA: Diagnosis not present

## 2022-04-16 DIAGNOSIS — N1831 Chronic kidney disease, stage 3a: Secondary | ICD-10-CM

## 2022-04-16 DIAGNOSIS — I480 Paroxysmal atrial fibrillation: Secondary | ICD-10-CM | POA: Diagnosis not present

## 2022-04-16 DIAGNOSIS — E039 Hypothyroidism, unspecified: Secondary | ICD-10-CM | POA: Diagnosis not present

## 2022-04-16 DIAGNOSIS — N400 Enlarged prostate without lower urinary tract symptoms: Secondary | ICD-10-CM

## 2022-04-16 DIAGNOSIS — R609 Edema, unspecified: Secondary | ICD-10-CM | POA: Diagnosis not present

## 2022-04-16 DIAGNOSIS — K219 Gastro-esophageal reflux disease without esophagitis: Secondary | ICD-10-CM | POA: Diagnosis not present

## 2022-04-16 DIAGNOSIS — I209 Angina pectoris, unspecified: Secondary | ICD-10-CM | POA: Diagnosis not present

## 2022-04-16 NOTE — Progress Notes (Unsigned)
Location:   [place] Room Number: 845 Place of Service:  ALF (13) Provider: Lennie Odor Oswin Johal NP  Virgie Dad, MD  Patient Care Team: Virgie Dad, MD as PCP - General (Internal Medicine) Danella Sensing, MD as Consulting Physician (Dermatology) Gagandeep Kossman X, NP as Nurse Practitioner (Internal Medicine)  Extended Emergency Contact Information Primary Emergency Contact: Siers,Bill Address: [address]          [place],  [address] [place] Phone: [phone] Mobile Phone: [phone] Relation: Brother Secondary Emergency Contact: [place] Mobile Phone: [phone] Relation: Relative  Code Status: DNR Goals of care: Advanced Directive information    01/25/2022    3:07 PM  Advanced Directives  Does Patient Have a Medical Advance Directive? Yes  Type of Paramedic of [place];Out of facility DNR (pink MOST or yellow form)  Does patient want to make changes to medical advance directive? No - Patient declined  Copy of [place] in Chart? Yes - validated most recent copy scanned in chart (See row information)  Pre-existing out of facility DNR order (yellow form or pink MOST form) Yellow form placed in chart (order not valid for inpatient use)     Chief Complaint  Patient presents with   Acute Visit    Abd skin fold rash    HPI:  Pt is a 88 y.o. male seen today for an acute visit for abd skin fold rash, failed Nystatin powder, state its sensitive.       Edema BLE, on Furosemide, chronic swelling.              The right buttock pressure ulcer  is healed with a scar tissue, short lived pain in the area come and goes, positional             Hx of Afib, heart rate is in control, on Digoxin. Dig level 1.2 07/09/21, takes Plavix             Hypothyroidism, stable, on Levothyroxine 75mg qd. 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   Cervical spondylosis without myelopathy 08/22/2015   Diverticulosis    Diverticulosis of colon without hemorrhage 08/22/2015   Edema 08/18/2015   Fall 07/17/2015   Gait disturbance 08/22/2015   GERD (gastroesophageal reflux disease)    occurs rarely   History of CVA (cerebrovascular accident) 07/17/2015   HLD (hyperlipidemia) 07/17/2015   Hyperlipidemia    Hypertension    Inguinal hernia    Left carotid bruit    [place]an[place] ([place]    left [place]   Osteopenia    Paroxysmal atrial fibrillation ([place]    Physical  deconditioning 08/22/2015   Retinal hemorrhage 04/24/2014   Rosacea blepharoconjunctivitis 11/20/2011   Stroke ([place]) 3/07   affected balance   Tubular adenoma of colon 2007   Weakness 07/17/2015   Past Surgical History:  Procedure Laterality Date   COLONOSCOPY  2008   EYE SURGERY Right    cataract extraction with IOL   INGUINAL HERNIA REPAIR Right 03/15/2013   Procedure: HERNIA REPAIR INGUINAL ADULT;  Surgeon: Odis Hollingshead, MD;  [facility];  Service: General;  Laterality: Right;   INSERTION OF MESH Right 03/15/2013   Procedure: INSERTION OF MESH;  Surgeon: Odis Hollingshead, MD;  [facility];  Service: General;  Laterality: Right;   left forearm melanoma     PROSTATE BIOPSY     SKIN SURGERY  08/04/2020   TONSILLECTOMY  1933    Allergies  Allergen Reactions   Aspirin    Eggs Or Egg-Derived Products Swelling    throat    Allergies as of 04/16/2022       Reactions    Aspirin    Eggs Or Egg-derived Products Swelling   throat        Medication List        Accurate as of April 16, 2022 12:45 PM. If you have any questions, ask your nurse or doctor.          ammonium lactate 12 % lotion Commonly known as: LAC-HYDRIN Apply 1 application. topically at bedtime.   cholecalciferol 10 MCG (400 UNIT) Tabs tablet Commonly known as: VITAMIN D3 Take 400 Units by mouth 2 (two) times daily.   clopidogrel 75 MG tablet Commonly known as: PLAVIX Take 75 mg by mouth daily. Start PLAVIX from 07/24/15 if no further hematuria is reported by patient and HB > 12.   digoxin 0.125 MG tablet Commonly known as: LANOXIN Take 0.125 mg by mouth daily.   dutasteride 0.5 MG capsule Commonly known as: AVODART Take 0.5 mg by mouth daily.   furosemide 20 MG tablet Commonly known as: LASIX Take 20 mg by mouth every other day.   isosorbide mononitrate 30 MG 24 hr tablet Commonly known as: IMDUR Take 0.5 tablets (15 mg total) by mouth daily.   levothyroxine 50 MCG tablet Commonly known as: SYNTHROID Take 50 mcg by mouth daily before breakfast.   nitroGLYCERIN 0.4 MG SL tablet Commonly known as: NITROSTAT Place 1 tablet under the tongue as needed.   pantoprazole 40 MG tablet Commonly known as: PROTONIX Take 1 tablet (40 mg total) by mouth daily.   polyethylene glycol 17 g packet Commonly known as: MIRALAX / GLYCOLAX Take 17 g by mouth daily as needed.   POTASSIUM CHLORIDE ER PO Take 1 capsule by mouth every other day.   pravastatin 20 MG tablet Commonly known as: PRAVACHOL Take 1 tablet by mouth at bedtime.   sodium fluoride 1.1 % Crea dental cream Commonly known as: PREVIDENT 5000 PLUS Place 1 Application onto teeth every evening.        Review of Systems  Constitutional:  Negative for fatigue, fever and unexpected weight change.  HENT:  Positive for hearing loss and trouble swallowing. Negative for congestion.        Cough while eating.    Eyes:  Negative for visual disturbance.  Respiratory:  Negative for cough, chest tightness, shortness of breath and wheezing.   Cardiovascular:  Positive for chest pain and leg swelling. Negative for palpitations.  Gastrointestinal:  Negative for abdominal pain and constipation.  Genitourinary:  Positive  for frequency. Negative for dysuria and urgency.  Musculoskeletal:  Positive for arthralgias and gait problem.  Skin:  Positive for rash.       Right buttock scar tissue from healed previous pressure ulcer   Neurological:  Negative for speech difficulty, weakness and headaches.  Psychiatric/Behavioral:  Negative for behavioral problems and sleep disturbance. The patient is not nervous/anxious.     Immunization History  Administered Date(s) Administered   Moderna Sars-Covid-2 Vaccination 07/06/2019, 08/02/2019, 05/06/2020, 11/25/2020   PPD Test 07/21/2015, 08/04/2015   Tdap 07/16/2015   Unspecified SARS-COV-2 Vaccination 03/17/2021   Zoster Recombinat (Shingrix) 12/04/2013   Pertinent  Health Maintenance Due  Topic Date Due   INFLUENZA VACCINE  Discontinued      08/17/2019   10:13 AM 03/06/2020   11:12 AM 07/04/2020   11:15 AM 08/05/2020    2:48 PM 09/12/2020   10:19 AM  Fall Risk  Falls in the past year? 0 0 0 0 0  Was there an injury with Fall?    0   Fall Risk Category Calculator    0   Fall Risk Category    Low   Patient Fall Risk Level    Low fall risk    Functional Status Survey:    There were no vitals filed for this visit. There is no height or weight on file to calculate BMI. Physical Exam Vitals and nursing note reviewed.  Constitutional:      Appearance: Normal appearance.  HENT:     Head: Normocephalic and atraumatic.     Mouth/Throat:     Mouth: Mucous membranes are moist.  Eyes:     Extraocular Movements: Extraocular movements intact.     Conjunctiva/sclera: Conjunctivae normal.     Pupils: Pupils are equal, round, and reactive to light.  Cardiovascular:      Rate and Rhythm: Normal rate. Rhythm irregular.     Heart sounds: No murmur heard. Pulmonary:     Effort: Pulmonary effort is normal.     Breath sounds: Rales present.     Comments: Bibasilar rales, L>R Abdominal:     Palpations: Abdomen is soft.     Tenderness: There is no abdominal tenderness.     Comments: Irregular schedule BM, but no constipation.   Musculoskeletal:     Cervical back: Normal range of motion and neck supple.     Right lower leg: Edema present.     Left lower leg: Edema present.     Comments: 1+ edema BLE  Skin:    General: Skin is warm and dry.     Findings: Erythema and rash present.     Comments: Chronic venous insufficiency skin changes. Right buttock scar tissue from healed previous pressure ulcer. Lateral left lower leg skin lesion.  Abd skin fold rash,bright redness with superficial skin excoriation,  failed Nystatin powder, state its sensitive.    Neurological:     General: No focal deficit present.     Mental Status: He is alert and oriented to person, place, and time. Mental status is at baseline.     Gait: Gait abnormal.  Psychiatric:        Mood and Affect: Mood normal.        Behavior: Behavior normal.        Thought Content: Thought content normal.        Judgment: Judgment normal.     Labs reviewed: Recent Labs    06/23/21 0000 10/22/21 0000  NA 139 141  K 4.4  4.5  CL 101 103  CO2 33* 34*  BUN 34* 29*  CREATININE 1.1 1.0  CALCIUM 9.8 9.4   Recent Labs    10/22/21 0000  AST 13*  ALT 6*  ALKPHOS 66  ALBUMIN 3.5   Recent Labs    10/22/21 0000  WBC 3.9  NEUTROABS 1,720.00  HGB 13.1*  HCT 40*  PLT 212   Lab Results  Component Value Date   TSH 3.12 10/22/2021   No results found for: "HGBA1C" Lab Results  Component Value Date   CHOL 133 10/22/2021   HDL 43 10/22/2021   LDLCALC 76 10/22/2021   TRIG 59 10/22/2021   CHOLHDL 2.6 07/10/2019    Significant Diagnostic Results in last 30 days:  No results  found.  Assessment/Plan: Candidal skin infection abd skin fold, bright redness with superficial skin excoriation noted, will treat with Diflucan '100mg'$  qd x 3 days, apply Nystatin cream bid/0.5% Triamcinolone cream bid x 10 days. Assist with personal hygiene, maintain the area dry if possible.   Edema BLE, on Furosemide, chronic swelling.   Paroxysmal atrial fibrillation (HCC) heart rate is in control, on Digoxin. Dig level 1.2 07/09/21, takes Plavix  Acquired hypothyroidism stable, on Levothyroxine 13mg qd. TSH 3.12 10/22/21  BPH (benign prostatic hyperplasia)    Urinary frequency, stable, on Dutasteride 0.'5mg'$  qd  CKD (chronic kidney disease) stage 3, GFR 30-59 ml/min (HCC) Bun/cret 29/1.0 10/22/21  GERD (gastroesophageal reflux disease) Stable,  takes Pantoprazole, Hgb 13.1 10/22/21  Hyperlipidemia LDL goal <70  takes Pravastatin, LDL 76 10/22/21  Angina pectoris ([place]   CAD/angina pectoris, takes Plavix, Isosorbide, prn NTG    Family/ staff Communication: plan of care reviewed with the patient and charge nurse.   Labs/tests ordered:  none  Time spend 40 minutes.

## 2022-04-16 NOTE — Assessment & Plan Note (Addendum)
BLE, on Furosemide, chronic swelling.

## 2022-04-16 NOTE — Assessment & Plan Note (Signed)
takes Pravastatin, LDL 76 10/22/21 

## 2022-04-16 NOTE — Assessment & Plan Note (Signed)
Urinary frequency, stable, on Dutasteride 0.'5mg'$  qd

## 2022-04-16 NOTE — Assessment & Plan Note (Signed)
abd skin fold, bright redness with superficial skin excoriation noted, will treat with Diflucan '100mg'$  qd x 3 days, apply Nystatin cream bid/0.5% Triamcinolone cream bid x 10 days. Assist with personal hygiene, maintain the area dry if possible.

## 2022-04-16 NOTE — Assessment & Plan Note (Signed)
CAD/angina pectoris, takes Plavix, Isosorbide, prn NTG 

## 2022-04-16 NOTE — Assessment & Plan Note (Signed)
stable, on Levothyroxine 50mcg qd. TSH 3.12 10/22/21 

## 2022-04-16 NOTE — Assessment & Plan Note (Signed)
Bun/cret 29/1.0 10/22/21 

## 2022-04-16 NOTE — Assessment & Plan Note (Signed)
Stable,  takes Pantoprazole, Hgb 13.1 10/22/21 

## 2022-04-16 NOTE — Assessment & Plan Note (Signed)
heart rate is in control, on Digoxin. Dig level 1.2 07/09/21, takes Plavix 

## 2022-04-19 ENCOUNTER — Encounter: Payer: Self-pay | Admitting: Nurse Practitioner

## 2022-04-29 ENCOUNTER — Encounter: Payer: Self-pay | Admitting: Nurse Practitioner

## 2022-04-29 ENCOUNTER — Non-Acute Institutional Stay: Payer: Medicare Other | Admitting: Nurse Practitioner

## 2022-04-29 DIAGNOSIS — N400 Enlarged prostate without lower urinary tract symptoms: Secondary | ICD-10-CM | POA: Diagnosis not present

## 2022-04-29 DIAGNOSIS — R1312 Dysphagia, oropharyngeal phase: Secondary | ICD-10-CM | POA: Diagnosis not present

## 2022-04-29 DIAGNOSIS — Z23 Encounter for immunization: Secondary | ICD-10-CM | POA: Diagnosis not present

## 2022-04-29 DIAGNOSIS — N1831 Chronic kidney disease, stage 3a: Secondary | ICD-10-CM | POA: Diagnosis not present

## 2022-04-29 DIAGNOSIS — B372 Candidiasis of skin and nail: Secondary | ICD-10-CM | POA: Diagnosis not present

## 2022-04-29 DIAGNOSIS — E039 Hypothyroidism, unspecified: Secondary | ICD-10-CM | POA: Diagnosis not present

## 2022-04-29 DIAGNOSIS — I739 Peripheral vascular disease, unspecified: Secondary | ICD-10-CM

## 2022-04-29 DIAGNOSIS — K219 Gastro-esophageal reflux disease without esophagitis: Secondary | ICD-10-CM | POA: Diagnosis not present

## 2022-04-29 DIAGNOSIS — I209 Angina pectoris, unspecified: Secondary | ICD-10-CM

## 2022-04-29 DIAGNOSIS — I480 Paroxysmal atrial fibrillation: Secondary | ICD-10-CM

## 2022-04-29 DIAGNOSIS — E785 Hyperlipidemia, unspecified: Secondary | ICD-10-CM

## 2022-04-29 NOTE — Assessment & Plan Note (Signed)
heart rate is in control, on Digoxin. Dig level 1.2 07/09/21, takes Plavix

## 2022-04-29 NOTE — Assessment & Plan Note (Signed)
Improved, but  persisted redness in abd skin fold, treated with oral Diflucan and topical Nystatin/Triamcinolone cream. Will continue Nystatin/0.5% Triamcinolone cream bid x 10 days.

## 2022-04-29 NOTE — Assessment & Plan Note (Signed)
on Furosemide, chronic swelling.

## 2022-04-29 NOTE — Assessment & Plan Note (Signed)
stable, on Levothyroxine 50mcg qd. TSH 3.12 10/22/21 

## 2022-04-29 NOTE — Assessment & Plan Note (Signed)
stable, on Dutasteride 0.5mg qd 

## 2022-04-29 NOTE — Assessment & Plan Note (Signed)
declined ST or MBSS, cough associated with swallowing.

## 2022-04-29 NOTE — Assessment & Plan Note (Signed)
Bun/cret 29/1.0 10/22/21

## 2022-04-29 NOTE — Assessment & Plan Note (Signed)
takes Pantoprazole, Hgb 13.1 10/22/21 

## 2022-04-29 NOTE — Assessment & Plan Note (Signed)
CAD/angina pectoris, takes Plavix, Isosorbide, prn NTG 

## 2022-04-29 NOTE — Progress Notes (Signed)
Location:   [place] Room Number: NO/813/A Place of Service:  ALF (13) Provider: Lennie Odor Blondina Coderre NP  Virgie Dad, MD  Patient Care Team: Virgie Dad, MD as PCP - General (Internal Medicine) Danella Sensing, MD as Consulting Physician (Dermatology) Antonietta Lansdowne X, NP as Nurse Practitioner (Internal Medicine)  Extended Emergency Contact Information Primary Emergency Contact: Deckman,Bill Address: [address]          [place],  [address] Johnnette Litter of [place] Phone: [phone] Mobile Phone: [phone] Relation: Brother Secondary Emergency Contact: Rosaland [place] Mobile Phone: [phone] Relation: Relative  Code Status: DNR Goals of care: Advanced Directive information    04/16/2022    2:49 PM  Advanced Directives  Does Patient Have a Medical Advance Directive? Yes  Type of Paramedic of [place];Out of facility DNR (pink MOST or yellow form)  Does patient want to make changes to medical advance directive? No - Patient declined  Copy of [place] in Chart? Yes - validated most recent copy scanned in chart (See row information)     Chief Complaint  Patient presents with   Acute Visit    Patient is being seen for persistent rash in abdominal fold    HPI:  Pt is a 88 y.o. male seen today for an acute visit for persisted redness in abd skin fold, treated with oral Diflucan and topical Nystatin/Triamcinolone cream.   Edema BLE, on Furosemide, chronic swelling.              The right buttock pressure ulcer  is healed with a scar tissue, short lived pain in the area come and goes, positional             Hx of Afib, heart rate is in control, on Digoxin. Dig level 1.2 07/09/21, takes Plavix             Hypothyroidism, stable, on Levothyroxine 36mg qd. TSH 3.12 10/22/21             Urinary frequency, stable, on Dutasteride 0.'5mg'$  qd             CKD Bun/cret 29/1.0 10/22/21  Dysphagia,  declined ST or MBSS, cough associated with swallowing.              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    left forearm   Osteopenia    Paroxysmal atrial fibrillation ([facility]    Physical deconditioning 08/22/2015   Retinal hemorrhage 04/24/2014  Rosacea blepharoconjunctivitis 11/20/2011   Stroke ([place]) 3/07   affected balance   Tubular adenoma of colon 2007   Weakness 07/17/2015   Past Surgical History:  Procedure Laterality Date   COLONOSCOPY  2008   EYE SURGERY Right    cataract extraction with IOL   INGUINAL HERNIA REPAIR Right 03/15/2013   Procedure: HERNIA REPAIR INGUINAL ADULT;  Surgeon: Odis Hollingshead, MD;  Location: [facility];  Service: General;  Laterality: Right;   INSERTION OF MESH Right 03/15/2013   Procedure: INSERTION OF MESH;  Surgeon: Odis Hollingshead, MD;  Location: [facility];  Service: General;  Laterality: Right;   left forearm melanoma     PROSTATE BIOPSY     SKIN SURGERY  08/04/2020   TONSILLECTOMY  1933    Allergies  Allergen Reactions   Aspirin    Eggs Or Egg-Derived Products Swelling    throat    Allergies as of 04/29/2022        Reactions   Aspirin    Eggs Or Egg-derived Products Swelling   throat        Medication List        Accurate as of April 29, 2022 11:59 PM. If you have any questions, ask your nurse or doctor.          ammonium lactate 12 % lotion Commonly known as: LAC-HYDRIN Apply 1 application. topically at bedtime.   cholecalciferol 10 MCG (400 UNIT) Tabs tablet Commonly known as: VITAMIN D3 Take 400 Units by mouth 2 (two) times daily.   clopidogrel 75 MG tablet Commonly known as: PLAVIX Take 75 mg by mouth daily. Start PLAVIX from 07/24/15 if no further hematuria is reported by patient and HB > 12.   digoxin 0.125 MG tablet Commonly known as: LANOXIN Take 0.125 mg by mouth daily.   dutasteride 0.5 MG capsule Commonly known as: AVODART Take 0.5 mg by mouth daily.   furosemide 20 MG tablet Commonly known as: LASIX Take 20 mg by mouth every other day.   isosorbide mononitrate 30 MG 24 hr tablet Commonly known as: IMDUR Take 0.5 tablets (15 mg total) by mouth daily.   levothyroxine 50 MCG tablet Commonly known as: SYNTHROID Take 50 mcg by mouth daily before breakfast.   nitroGLYCERIN 0.4 MG SL tablet Commonly known as: NITROSTAT Place 1 tablet under the tongue as needed.   pantoprazole 40 MG tablet Commonly known as: PROTONIX Take 1 tablet (40 mg total) by mouth daily.   polyethylene glycol 17 g packet Commonly known as: MIRALAX / GLYCOLAX Take 17 g by mouth daily as needed.   POTASSIUM CHLORIDE ER PO Take 1 capsule by mouth every other day.   pravastatin 20 MG tablet Commonly known as: PRAVACHOL Take 1 tablet by mouth at bedtime.   sodium fluoride 1.1 % Crea dental cream Commonly known as: PREVIDENT 5000 PLUS Place 1 Application onto teeth every evening.        Review of Systems  Constitutional:  Negative for fatigue, fever and unexpected weight change.  HENT:  Positive for hearing loss and trouble swallowing. Negative for congestion.        Cough  while eating.   Eyes:  Negative for visual disturbance.  Respiratory:  Negative for cough, chest tightness, shortness of breath and wheezing.   Cardiovascular:  Positive for chest pain and leg swelling. Negative for palpitations.  Gastrointestinal:  Negative for abdominal pain and constipation.  Genitourinary:  Positive for frequency. Negative for dysuria and urgency.  Musculoskeletal:  Positive for arthralgias and gait problem.  Skin:  Positive for rash.       Right buttock scar tissue from healed previous pressure ulcer   Neurological:  Negative for speech difficulty, weakness and headaches.  Psychiatric/Behavioral:  Negative for behavioral problems and sleep disturbance. The patient is not nervous/anxious.     Immunization History  Administered Date(s) Administered   Moderna Sars-Covid-2 Vaccination 07/06/2019, 08/02/2019, 05/06/2020, 11/25/2020   PPD Test 07/21/2015, 08/04/2015   Tdap 07/16/2015   Unspecified SARS-COV-2 Vaccination 03/17/2021   Zoster Recombinat (Shingrix) 12/04/2013   Pertinent  Health Maintenance Due  Topic Date Due   INFLUENZA VACCINE  Discontinued      03/06/2020   11:12 AM 07/04/2020   11:15 AM 08/05/2020    2:48 PM 09/12/2020   10:19 AM 04/16/2022    2:46 PM  [place] in the past year? 0 0 0 0 0  Was there an injury with Fall?   0  0  Fall Risk Category Calculator   0  0  Fall Risk Category   Low  Low  Patient Fall Risk Level   Low fall risk  Low fall risk  Patient at Risk for Falls Due to     No Fall Risks  Fall risk Follow up     Falls evaluation completed   Functional Status Survey:    Vitals:   04/29/22 1301  BP: (!) 144/72  Pulse: 78  Resp: 18  Temp: 97.7 F (36.5 C)  SpO2: 98%  Weight: 132 lb 9.6 oz (60.1 kg)   Body mass index is 17.49 kg/m. Physical Exam Vitals and nursing note reviewed.  Constitutional:      Appearance: Normal appearance.  HENT:     Head: Normocephalic and atraumatic.     Mouth/Throat:     Mouth:  Mucous membranes are moist.  Eyes:     Extraocular Movements: Extraocular movements intact.     Conjunctiva/sclera: Conjunctivae normal.     Pupils: Pupils are equal, round, and reactive to light.  Cardiovascular:     Rate and Rhythm: Normal rate. Rhythm irregular.     Heart sounds: No murmur heard. Pulmonary:     Effort: Pulmonary effort is normal.     Breath sounds: Rales present.     Comments: Bibasilar rales, L>R Abdominal:     Palpations: Abdomen is soft.     Tenderness: There is no abdominal tenderness.     Comments: Irregular schedule BM, but no constipation.   Musculoskeletal:     Cervical back: Normal range of motion and neck supple.     Right lower leg: Edema present.     Left lower leg: Edema present.     Comments: 1+ edema BLE  Skin:    General: Skin is warm and dry.     Findings: Erythema and rash present.     Comments: Chronic venous insufficiency skin changes. Right buttock scar tissue from healed previous pressure ulcer. Lateral left lower leg skin lesion.  Abd skin fold rash,bright redness with superficial skin excoriation-improved, redness persisted, denied pain, itching, or sensitive.    Neurological:     General: No focal deficit present.     Mental Status: He is alert and oriented to person, place, and time. Mental status is at baseline.     Gait: Gait abnormal.  Psychiatric:        Mood and Affect: Mood normal.        Behavior: Behavior normal.  Thought Content: Thought content normal.        Judgment: Judgment normal.     Labs reviewed: Recent Labs    06/23/21 0000 10/22/21 0000  NA 139 141  K 4.4 4.5  CL 101 103  CO2 33* 34*  BUN 34* 29*  CREATININE 1.1 1.0  CALCIUM 9.8 9.4   Recent Labs    10/22/21 0000  AST 13*  ALT 6*  ALKPHOS 66  ALBUMIN 3.5   Recent Labs    10/22/21 0000  WBC 3.9  NEUTROABS 1,720.00  HGB 13.1*  HCT 40*  PLT 212   Lab Results  Component Value Date   TSH 3.12 10/22/2021   No results found for:  "HGBA1C" Lab Results  Component Value Date   CHOL 133 10/22/2021   HDL 43 10/22/2021   LDLCALC 76 10/22/2021   TRIG 59 10/22/2021   CHOLHDL 2.6 07/10/2019    Significant Diagnostic Results in last 30 days:  No results found.  Assessment/Plan: Candidal skin infection Improved, but  persisted redness in abd skin fold, treated with oral Diflucan and topical Nystatin/Triamcinolone cream. Will continue Nystatin/0.5% Triamcinolone cream bid x 10 days.   PVD (peripheral vascular disease) (HCC) on Furosemide, chronic swelling.   Paroxysmal atrial fibrillation (HCC) heart rate is in control, on Digoxin. Dig level 1.2 07/09/21, takes Plavix  Acquired hypothyroidism  stable, on Levothyroxine 101mg qd. TSH 3.12 10/22/21  BPH (benign prostatic hyperplasia) stable, on Dutasteride 0.'5mg'$  qd  CKD (chronic kidney disease) stage 3, GFR 30-59 ml/min (HCC) Bun/cret 29/1.0 10/22/21  Dysphagia declined ST or MBSS, cough associated with swallowing.   GERD (gastroesophageal reflux disease) takes Pantoprazole, Hgb 13.1 10/22/21  Hyperlipidemia LDL goal <70 takes Pravastatin, LDL 76 10/22/21  Angina pectoris ([facility]  CAD/angina pectoris, takes Plavix, Isosorbide, prn NTG    Family/ staff Communication: plan of care reviewed with the patient and charge nurse.   Labs/tests ordered:  none  Time spend 40 minutes.

## 2022-04-29 NOTE — Assessment & Plan Note (Signed)
takes Pravastatin, LDL 76 10/22/21

## 2022-05-03 ENCOUNTER — Encounter: Payer: Self-pay | Admitting: Nurse Practitioner

## 2022-06-04 ENCOUNTER — Encounter: Payer: Self-pay | Admitting: Nurse Practitioner

## 2022-06-04 ENCOUNTER — Non-Acute Institutional Stay: Payer: Medicare Other | Admitting: Nurse Practitioner

## 2022-06-04 DIAGNOSIS — I480 Paroxysmal atrial fibrillation: Secondary | ICD-10-CM

## 2022-06-04 DIAGNOSIS — I872 Venous insufficiency (chronic) (peripheral): Secondary | ICD-10-CM | POA: Diagnosis not present

## 2022-06-04 DIAGNOSIS — N1831 Chronic kidney disease, stage 3a: Secondary | ICD-10-CM | POA: Diagnosis not present

## 2022-06-04 DIAGNOSIS — N400 Enlarged prostate without lower urinary tract symptoms: Secondary | ICD-10-CM

## 2022-06-04 DIAGNOSIS — E039 Hypothyroidism, unspecified: Secondary | ICD-10-CM

## 2022-06-04 DIAGNOSIS — I209 Angina pectoris, unspecified: Secondary | ICD-10-CM

## 2022-06-04 DIAGNOSIS — R609 Edema, unspecified: Secondary | ICD-10-CM

## 2022-06-04 NOTE — Assessment & Plan Note (Signed)
Bun/cret 29/1.0 10/22/21

## 2022-06-04 NOTE — Assessment & Plan Note (Signed)
Better diurese with increasing Furosemide '40mg'$ /'20mg'$  qd, apply Nystatin cream bid/Triamcinolone 0.5% cream bid to BLE x 2 weeks. Observe.

## 2022-06-04 NOTE — Assessment & Plan Note (Signed)
stable, on Levothyroxine 50mcg qd. TSH 3.12 10/22/21 

## 2022-06-04 NOTE — Assessment & Plan Note (Signed)
Worsened, denied SOB or cough, increase Furosemide '40mg'$ /'20mg'$  qd, Kcl 70mq/10meq qd, update BMP one week.

## 2022-06-04 NOTE — Progress Notes (Signed)
Location:   [place] Room Number: 299 Place of Service:  SNF (31) Provider: [facility] Harkirat Orozco NP  Virgie Dad, MD  Patient Care Team: Virgie Dad, MD as PCP - General (Internal Medicine) Danella Sensing, MD as Consulting Physician (Dermatology) Duwayne Matters X, NP as Nurse Practitioner (Internal Medicine)  Extended Emergency Contact Information Primary Emergency Contact: Palazzola,Bill Address: [address]          [place], [place] [address] Johnnette Litter of [place] Phone: [phone] Mobile Phone: [phone] Relation: Brother Secondary Emergency Contact: [place] Mobile Phone: [phone] Relation: Relative  Code Status: DNR Goals of care: Advanced Directive information    04/16/2022    2:49 PM  Advanced Directives  Does Patient Have a Medical Advance Directive? Yes  Type of Paramedic of [place];Out of facility DNR (pink MOST or yellow form)  Does patient want to make changes to medical advance directive? No - Patient declined  Copy of [place] in Chart? Yes - validated most recent copy scanned in chart (See row information)     Chief Complaint  Patient presents with   Acute Visit    Edema BLE, stasis dermatitis BLE    HPI:  Pt is a 88 y.o. male seen today for an acute visit for BLE erythema, thick scaly skin BLE, sensitive to touch, denied pain with walking upon my visit.      Edema BLE, on Furosemide, chronic swelling.              The right buttock pressure ulcer  is healed with a scar tissue, short lived pain in the area come and goes, positional             Hx of Afib, heart rate is in control, on Digoxin. Dig level 1.2 07/09/21, takes Plavix             Hypothyroidism, stable, on Levothyroxine 60mg qd. TSH 3.12 10/22/21             Urinary frequency, stable, on Dutasteride 0.'5mg'$  qd             CKD Bun/cret 29/1.0 10/22/21             Dysphagia, declined ST or MBSS, cough  associated with swallowing.              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   Cervical spondylosis without myelopathy 08/22/2015   Diverticulosis    Diverticulosis of colon without hemorrhage 08/22/2015   Edema 08/18/2015   Fall 07/17/2015   Gait disturbance 08/22/2015   GERD (gastroesophageal reflux disease)    occurs rarely   History of CVA (cerebrovascular accident) 07/17/2015   HLD (hyperlipidemia) 07/17/2015   Hyperlipidemia    Hypertension    Inguinal hernia    Left carotid bruit    Melanoma ([facility]    left forearm   Osteopenia    [place]  Physical deconditioning 08/22/2015   Retinal hemorrhage 04/24/2014   Rosacea blepharoconjunctivitis 11/20/2011   Stroke ([place]) 3/07   affected balance   Tubular adenoma of colon 2007   Weakness 07/17/2015   Past Surgical History:  Procedure Laterality Date   COLONOSCOPY  2008   EYE SURGERY Right    cataract extraction with IOL   INGUINAL HERNIA REPAIR Right 03/15/2013   Procedure: HERNIA REPAIR INGUINAL ADULT;  Surgeon: Odis Hollingshead, MD;  [facility];  Service: General;  Laterality: Right;   INSERTION OF MESH Right 03/15/2013   Procedure: INSERTION OF MESH;  Surgeon: Odis Hollingshead, MD;  [facility];  Service: General;  Laterality: Right;   left forearm melanoma     PROSTATE BIOPSY     SKIN SURGERY  08/04/2020   TONSILLECTOMY  1933    Allergies  Allergen Reactions   Aspirin    Eggs Or Egg-Derived Products Swelling    throat    Allergies as of 06/04/2022       Reactions   Aspirin    Eggs  Or Egg-derived Products Swelling   throat        Medication List        Accurate as of June 04, 2022  1:51 PM. If you have any questions, ask your nurse or doctor.          ammonium lactate 12 % lotion Commonly known as: LAC-HYDRIN Apply 1 application. topically at bedtime.   cholecalciferol 10 MCG (400 UNIT) Tabs tablet Commonly known as: VITAMIN D3 Take 400 Units by mouth 2 (two) times daily.   clopidogrel 75 MG tablet Commonly known as: PLAVIX Take 75 mg by mouth daily. Start PLAVIX from 07/24/15 if no further hematuria is reported by patient and HB > 12.   digoxin 0.125 MG tablet Commonly known as: LANOXIN Take 0.125 mg by mouth daily.   dutasteride 0.5 MG capsule Commonly known as: AVODART Take 0.5 mg by mouth daily.   furosemide 20 MG tablet Commonly known as: LASIX Take 20 mg by mouth every other day.   isosorbide mononitrate 30 MG 24 hr tablet Commonly known as: IMDUR Take 0.5 tablets (15 mg total) by mouth daily.   levothyroxine 50 MCG tablet Commonly known as: SYNTHROID Take 50 mcg by mouth daily before breakfast.   nitroGLYCERIN 0.4 MG SL tablet Commonly known as: NITROSTAT Place 1 tablet under the tongue as needed.   pantoprazole 40 MG tablet Commonly known as: PROTONIX Take 1 tablet (40 mg total) by mouth daily.   polyethylene glycol 17 g packet Commonly known as: MIRALAX / GLYCOLAX Take 17 g by mouth daily as needed.   POTASSIUM CHLORIDE ER PO Take 1 capsule by mouth every other day.   pravastatin 20 MG tablet Commonly known as: PRAVACHOL Take 1 tablet by mouth at bedtime.   sodium fluoride 1.1 % Crea dental cream Commonly known as: PREVIDENT 5000 PLUS Place 1 Application onto teeth every evening.        Review of Systems  Constitutional:  Negative for fatigue, fever and unexpected weight change.  HENT:  Positive for hearing loss and trouble swallowing. Negative for congestion.        Cough while eating.   Eyes:  Negative  for visual disturbance.  Respiratory:  Negative for cough, chest tightness, shortness of breath and wheezing.   Cardiovascular:  Positive for leg swelling. Negative for chest pain and palpitations.  Gastrointestinal:  Negative for abdominal pain and constipation.  Genitourinary:  Positive for frequency. Negative for dysuria and urgency.  Musculoskeletal:  Positive for arthralgias and gait problem.  Skin:        Right buttock scar tissue from healed previous pressure ulcer. Thick scaly skin, erythematous swelling BLE  Neurological:  Negative for speech difficulty, weakness and headaches.  Psychiatric/Behavioral:  Negative for behavioral problems and sleep disturbance. The patient is not nervous/anxious.     Immunization History  Administered Date(s) Administered   Moderna Sars-Covid-2 Vaccination 07/06/2019, 08/02/2019, 05/06/2020, 11/25/2020   PPD Test 07/21/2015, 08/04/2015   Tdap 07/16/2015   Unspecified SARS-COV-2 Vaccination 03/17/2021   Zoster Recombinat (Shingrix) 12/04/2013   Pertinent  Health Maintenance Due  Topic Date Due   INFLUENZA VACCINE  Discontinued      03/06/2020   11:12 AM 07/04/2020   11:15 AM 08/05/2020    2:48 PM 09/12/2020   10:19 AM 04/16/2022    2:46 PM  [place] in the past year? 0 0 0 0 0  Was there an injury with Fall?   0  0  Fall Risk Category Calculator   0  0  Fall Risk Category   Low  Low  Patient Fall Risk Level   Low fall risk  Low fall risk  Patient at Risk for Falls Due to     No Fall Risks  Fall risk Follow up     Falls evaluation completed   Functional Status Survey:    Vitals:   06/04/22 1331  BP: 132/76  Pulse: 72  Resp: 16  Temp: 97.9 F (36.6 C)  SpO2: 96%  Weight: 136 lb 6.4 oz (61.9 kg)   Body mass index is 18 kg/m. Physical Exam Vitals and nursing note reviewed.  Constitutional:      Appearance: Normal appearance.  HENT:     Head: Normocephalic and atraumatic.     Mouth/Throat:     Mouth: Mucous membranes are  moist.  Eyes:     Extraocular Movements: Extraocular movements intact.     Conjunctiva/sclera: Conjunctivae normal.     Pupils: Pupils are equal, round, and reactive to light.  Cardiovascular:     Rate and Rhythm: Normal rate. Rhythm irregular.     Heart sounds: No murmur heard. Pulmonary:     Effort: Pulmonary effort is normal.     Breath sounds: Rales present.     Comments: Bibasilar rales, L>R Abdominal:     Palpations: Abdomen is soft.     Tenderness: There is no abdominal tenderness.     Comments: Irregular schedule BM, but no constipation.   Musculoskeletal:     Cervical back: Normal range of motion and neck supple.     Right lower leg: Edema present.     Left lower leg: Edema present.     Comments: 1-2+ edema BLE  Skin:    General: Skin is warm and dry.     Findings: Erythema present.     Comments: Chronic venous insufficiency skin changes. Thick scaly skin, erythematous swelling BLE, mild warmth and sensitive to touch Right buttock scar tissue from healed previous pressure ulcer. Lateral left lower leg skin lesion.     Neurological:     General: No focal deficit present.     Mental Status: He is alert and oriented to person, place, and time. Mental status is at baseline.     Gait: Gait abnormal.  Psychiatric:        Mood and Affect: Mood normal.  Behavior: Behavior normal.        Thought Content: Thought content normal.        Judgment: Judgment normal.     Labs reviewed: Recent Labs    06/23/21 0000 10/22/21 0000  NA 139 141  K 4.4 4.5  CL 101 103  CO2 33* 34*  BUN 34* 29*  CREATININE 1.1 1.0  CALCIUM 9.8 9.4   Recent Labs    10/22/21 0000  AST 13*  ALT 6*  ALKPHOS 66  ALBUMIN 3.5   Recent Labs    10/22/21 0000  WBC 3.9  NEUTROABS 1,720.00  HGB 13.1*  HCT 40*  PLT 212   Lab Results  Component Value Date   TSH 3.12 10/22/2021   No results found for: "HGBA1C" Lab Results  Component Value Date   CHOL 133 10/22/2021   HDL 43  10/22/2021   LDLCALC 76 10/22/2021   TRIG 59 10/22/2021   CHOLHDL 2.6 07/10/2019    Significant Diagnostic Results in last 30 days:  No results found.  Assessment/Plan: Venous stasis dermatitis of both lower extremities Better diurese with increasing Furosemide '40mg'$ /'20mg'$  qd, apply Nystatin cream bid/Triamcinolone 0.5% cream bid to BLE x 2 weeks. Observe.   Edema Worsened, denied SOB or cough, increase Furosemide '40mg'$ /'20mg'$  qd, Kcl 61mq/10meq qd, update BMP one week.   Paroxysmal atrial fibrillation (HCC) heart rate is in control, on Digoxin. Dig level 1.2 07/09/21, takes Plavix  Acquired hypothyroidism stable, on Levothyroxine 573m qd. TSH 3.12 10/22/21  BPH (benign prostatic hyperplasia) stable, on Dutasteride 0.'5mg'$  qd  CKD (chronic kidney disease) stage 3, GFR 30-59 ml/min (HCC) Bun/cret 29/1.0 10/22/21  Angina pectoris ([place] reported chest pain since last seen,  CAD/angina pectoris, takes Plavix, Isosorbide, prn NTG    Family/ staff Communication: plan of care reviewed with the patient and charge nurse.   Labs/tests ordered:  BMP one week  Time spend 40 minutes.

## 2022-06-04 NOTE — Assessment & Plan Note (Signed)
stable, on Dutasteride 0.5mg qd 

## 2022-06-04 NOTE — Assessment & Plan Note (Signed)
No reported chest pain since last seen,  CAD/angina pectoris, takes Plavix, Isosorbide, prn NTG

## 2022-06-04 NOTE — Assessment & Plan Note (Signed)
heart rate is in control, on Digoxin. Dig level 1.2 07/09/21, takes Plavix

## 2022-06-15 DIAGNOSIS — I10 Essential (primary) hypertension: Secondary | ICD-10-CM | POA: Diagnosis not present

## 2022-06-15 DIAGNOSIS — I480 Paroxysmal atrial fibrillation: Secondary | ICD-10-CM | POA: Diagnosis not present

## 2022-06-15 LAB — BASIC METABOLIC PANEL
BUN: 38 — AB (ref 4–21)
CO2: 35 — AB (ref 13–22)
Chloride: 101 (ref 99–108)
Creatinine: 1.4 — AB (ref 0.6–1.3)
Glucose: 95
Potassium: 4.5 mEq/L (ref 3.5–5.1)
Sodium: 139 (ref 137–147)

## 2022-06-15 LAB — COMPREHENSIVE METABOLIC PANEL
Calcium: 9.3 (ref 8.7–10.7)
eGFR: 48

## 2022-06-17 ENCOUNTER — Non-Acute Institutional Stay: Payer: Medicare Other | Admitting: Nurse Practitioner

## 2022-06-17 DIAGNOSIS — E039 Hypothyroidism, unspecified: Secondary | ICD-10-CM

## 2022-06-17 DIAGNOSIS — N400 Enlarged prostate without lower urinary tract symptoms: Secondary | ICD-10-CM

## 2022-06-17 DIAGNOSIS — E785 Hyperlipidemia, unspecified: Secondary | ICD-10-CM

## 2022-06-17 DIAGNOSIS — K219 Gastro-esophageal reflux disease without esophagitis: Secondary | ICD-10-CM

## 2022-06-17 DIAGNOSIS — N1831 Chronic kidney disease, stage 3a: Secondary | ICD-10-CM

## 2022-06-17 DIAGNOSIS — I480 Paroxysmal atrial fibrillation: Secondary | ICD-10-CM | POA: Diagnosis not present

## 2022-06-17 DIAGNOSIS — I209 Angina pectoris, unspecified: Secondary | ICD-10-CM | POA: Diagnosis not present

## 2022-06-17 DIAGNOSIS — I872 Venous insufficiency (chronic) (peripheral): Secondary | ICD-10-CM

## 2022-06-17 DIAGNOSIS — R1312 Dysphagia, oropharyngeal phase: Secondary | ICD-10-CM

## 2022-06-17 NOTE — Assessment & Plan Note (Signed)
Bun/cret 38/1.37 06/15/22 

## 2022-06-17 NOTE — Progress Notes (Signed)
[facility]   [facility]   Place of Service:  [facility] ([facility]) Provider: Lennie Odor Shakora Nordquist NP  Virgie Dad, MD  Patient Care Team: Virgie Dad, MD as PCP - General (Internal Medicine) Danella Sensing, MD as Consulting Physician (Dermatology) Ima Hafner X, NP as Nurse Practitioner (Internal Medicine)  Extended Emergency Contact Information Primary Emergency Contact: Palladino,Bill Address: [address]          [place], [place] [address] Johnnette Litter of [place] Phone: [phone] Mobile Phone: [phone] Relation: Brother Secondary Emergency Contact: Rosaland [place]o [place] of [place] Mobile Phone: [phone] Relation: Relative  Code Status:  DNR Goals of care: Advanced Directive information    04/16/2022    2:49 PM  Advanced Directives  Does Patient Have a Medical Advance Directive? Yes  Type of Paramedic of [place];Out of facility DNR (pink MOST or yellow form)  Does patient want to make changes to medical advance directive? No - Patient declined  Copy of [place] in Chart? Yes - validated most recent copy scanned in chart (See row information)     Chief Complaint  Patient presents with   Medical Management of Chronic Issues    HPI:  Pt is a 88 y.o. male seen today for medical management of chronic diseases.    Venous dermatitis, BLE erythema, thick scaly skin BLE, sensitive to touch, persisted since Furosemide increased along with Nystatin/Triamcinolone cream             Edema BLE, on Furosemide, chronic swelling.              The right buttock pressure ulcer  is healed with a scar tissue, short lived pain in the area come and goes, positional             Hx of Afib, heart rate is in control, on Digoxin. Dig level 1.2 07/09/21, takes Plavix             Hypothyroidism, stable, on Levothyroxine 70mg qd. TSH 3.12 10/22/21             Urinary frequency, stable, on Dutasteride 0.'5mg'$  qd             CKD Bun/cret 38/1.37  06/15/22             Dysphagia, declined ST or MBSS, cough associated with swallowing.              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   Cervical spondylosis without myelopathy 08/22/2015   Diverticulosis    Diverticulosis of colon without hemorrhage 08/22/2015   Edema 08/18/2015   Fall 07/17/2015   Gait disturbance 08/22/2015   GERD (gastroesophageal reflux disease)    occurs rarely   History of CVA (cerebrovascular accident) 07/17/2015   HLD (hyperlipidemia) 07/17/2015   Hyperlipidemia    Hypertension    Inguinal hernia    Left carotid bruit    Melanoma ([place]    left forearm   Osteopenia  Paroxysmal atrial fibrillation (HCC)    Physical deconditioning 08/22/2015   Retinal hemorrhage 04/24/2014   Rosacea blepharoconjunctivitis 11/20/2011   Stroke ([place]) 3/07   affected balance   Tubular adenoma of colon 2007   Weakness 07/17/2015   Past Surgical History:  Procedure Laterality Date   COLONOSCOPY  2008   EYE SURGERY Right    cataract extraction with IOL   INGUINAL HERNIA REPAIR Right 03/15/2013   Procedure: HERNIA REPAIR INGUINAL ADULT;  Surgeon: Odis Hollingshead, MD;  Location[facility];  Service: General;  Laterality: Right;   INSERTION OF MESH Right 03/15/2013   Procedure: INSERTION OF MESH;  Surgeon: Odis Hollingshead, MD;  [facility];  Service: General;  Laterality: Right;   left forearm melanoma     PROSTATE BIOPSY     SKIN SURGERY  08/04/2020   TONSILLECTOMY  1933    Allergies  Allergen Reactions   Aspirin    Eggs Or Egg-Derived Products Swelling    throat     Allergies as of 06/17/2022       Reactions   Aspirin    Eggs Or Egg-derived Products Swelling   throat        Medication List        Accurate as of June 17, 2022 11:59 PM. If you have any questions, ask your nurse or doctor.          ammonium lactate 12 % lotion Commonly known as: LAC-HYDRIN Apply 1 application. topically at bedtime.   cholecalciferol 10 MCG (400 UNIT) Tabs tablet Commonly known as: VITAMIN D3 Take 400 Units by mouth 2 (two) times daily.   clopidogrel 75 MG tablet Commonly known as: PLAVIX Take 75 mg by mouth daily. Start PLAVIX from 07/24/15 if no further hematuria is reported by patient and HB > 12.   digoxin 0.125 MG tablet Commonly known as: LANOXIN Take 0.125 mg by mouth daily.   dutasteride 0.5 MG capsule Commonly known as: AVODART Take 0.5 mg by mouth daily.   furosemide 20 MG tablet Commonly known as: LASIX Take 20 mg by mouth every other day.   isosorbide mononitrate 30 MG 24 hr tablet Commonly known as: IMDUR Take 0.5 tablets (15 mg total) by mouth daily.   levothyroxine 50 MCG tablet Commonly known as: SYNTHROID Take 50 mcg by mouth daily before breakfast.   nitroGLYCERIN 0.4 MG SL tablet Commonly known as: NITROSTAT Place 1 tablet under the tongue as needed.   pantoprazole 40 MG tablet Commonly known as: PROTONIX Take 1 tablet (40 mg total) by mouth daily.   polyethylene glycol 17 g packet Commonly known as: MIRALAX / GLYCOLAX Take 17 g by mouth daily as needed.   POTASSIUM CHLORIDE ER PO Take 1 capsule by mouth every other day.   pravastatin 20 MG tablet Commonly known as: PRAVACHOL Take 1 tablet by mouth at bedtime.   sodium fluoride 1.1 % Crea dental cream Commonly known as: PREVIDENT 5000 PLUS Place 1 Application onto teeth every evening.        Review of Systems  Constitutional:  Negative for fatigue, fever and unexpected weight change.  HENT:  Positive for hearing loss and trouble swallowing.  Negative for congestion.        Cough while eating.   Eyes:  Negative for visual disturbance.  Respiratory:  Negative for cough, chest tightness, shortness of breath and wheezing.   Cardiovascular:  Positive for leg swelling. Negative for chest pain and palpitations.  Gastrointestinal:  Negative for  abdominal pain and constipation.  Genitourinary:  Positive for frequency. Negative for dysuria and urgency.  Musculoskeletal:  Positive for arthralgias and gait problem.  Skin:        Right buttock scar tissue from healed previous pressure ulcer. Thick scaly skin, erythematous swelling BLE  Neurological:  Negative for speech difficulty, weakness and headaches.  Psychiatric/Behavioral:  Negative for behavioral problems and sleep disturbance. The patient is not nervous/anxious.     Immunization History  Administered Date(s) Administered   Moderna Sars-Covid-2 Vaccination 07/06/2019, 08/02/2019, 05/06/2020, 11/25/2020   PPD Test 07/21/2015, 08/04/2015   Pfizer Covid-19 Vaccine Bivalent Booster 55yr & up 04/29/2022   Tdap 07/16/2015   Unspecified SARS-COV-2 Vaccination 03/17/2021   Zoster Recombinat (Shingrix) 12/04/2013   Pertinent  Health Maintenance Due  Topic Date Due   INFLUENZA VACCINE  Discontinued      03/06/2020   11:12 AM 07/04/2020   11:15 AM 08/05/2020    2:48 PM 09/12/2020   10:19 AM 04/16/2022    2:46 PM  FC[place] the past year? 0 0 0 0 0  Was there an injury with Fall?   0  0  Fall Risk Category Calculator   0  0  Fall Risk Category   Low  Low  Patient Fall Risk Level   Low fall risk  Low fall risk  Patient at Risk for Falls Due to     No Fall Risks  Fall risk Follow up     Falls evaluation completed   Functional Status Survey:    Vitals:   06/17/22 1456  BP: 132/69  Pulse: 67  Resp: 18  Temp: (!) 97 F (36.1 C)  SpO2: 98%  Weight: 136 lb 6.4 oz (61.9 kg)   Body mass index is 18 kg/m. Physical Exam Vitals and nursing note reviewed.   Constitutional:      Appearance: Normal appearance.  HENT:     Head: Normocephalic and atraumatic.     Mouth/Throat:     Mouth: Mucous membranes are moist.  Eyes:     Extraocular Movements: Extraocular movements intact.     Conjunctiva/sclera: Conjunctivae normal.     Pupils: Pupils are equal, round, and reactive to light.  Cardiovascular:     Rate and Rhythm: Normal rate. Rhythm irregular.     Heart sounds: No murmur heard. Pulmonary:     Effort: Pulmonary effort is normal.     Breath sounds: Rales present.     Comments: Bibasilar rales, L>R Abdominal:     Palpations: Abdomen is soft.     Tenderness: There is no abdominal tenderness.     Comments: Irregular schedule BM, but no constipation.   Musculoskeletal:     Cervical back: Normal range of motion and neck supple.     Right lower leg: Edema present.     Left lower leg: Edema present.     Comments: 1-2+ edema BLE  Skin:    General: Skin is warm and dry.     Findings: Erythema present.     Comments: Chronic venous insufficiency skin changes. Thick scaly skin, erythematous swelling BLE, mild warmth and sensitive to touch Right buttock scar tissue from healed previous pressure ulcer. Lateral left lower leg skin lesion.     Neurological:     General: No focal deficit present.     Mental Status: He is alert and oriented to person, place, and time. Mental status is at baseline.     Gait: Gait abnormal.  Psychiatric:  Mood and Affect: Mood normal.        Behavior: Behavior normal.        Thought Content: Thought content normal.        Judgment: Judgment normal.     Labs reviewed: Recent Labs    06/23/21 0000 10/22/21 0000  NA 139 141  K 4.4 4.5  CL 101 103  CO2 33* 34*  BUN 34* 29*  CREATININE 1.1 1.0  CALCIUM 9.8 9.4   Recent Labs    10/22/21 0000  AST 13*  ALT 6*  ALKPHOS 66  ALBUMIN 3.5   Recent Labs    10/22/21 0000  WBC 3.9  NEUTROABS 1,720.00  HGB 13.1*  HCT 40*  PLT 212   Lab  Results  Component Value Date   TSH 3.12 10/22/2021   No results found for: "HGBA1C" Lab Results  Component Value Date   CHOL 133 10/22/2021   HDL 43 10/22/2021   LDLCALC 76 10/22/2021   TRIG 59 10/22/2021   CHOLHDL 2.6 07/10/2019    Significant Diagnostic Results in last 30 days:  No results found.  Assessment/Plan  Paroxysmal atrial fibrillation (HCC) heart rate is in control, on Digoxin. Dig level 1.2 07/09/21, takes Plavix  Acquired hypothyroidism stable, on Levothyroxine 41mg qd. TSH 3.12 10/22/21  Venous stasis dermatitis of both lower extremities BLE erythema, thick scaly skin BLE, sensitive to touch, persisted, dc Nystatin/Triamcinolone cream, [facility]   BPH (benign prostatic hyperplasia)  stable, on Dutasteride 0.'5mg'$  qd  CKD (chronic kidney disease) stage 3, GFR 30-59 ml/min (HCC) Bun/cret 38/1.37 06/15/22  Dysphagia  declined ST or MBSS, cough associated with swallowing.   GERD (gastroesophageal reflux disease) Stable,  takes Pantoprazole, Hgb 13.1 10/22/21  Hyperlipidemia LDL goal <70  takes Pravastatin, LDL 76 10/22/21  Angina pectoris ([place] No chest pain since last visited, takes Plavix, Isosorbide, prn NTG   Family/ staff Communication: plan of care reviewed with the patient and charge nurse.   Labs/tests ordered:  none   Time spend 40 minutes.

## 2022-06-17 NOTE — Assessment & Plan Note (Signed)
declined ST or MBSS, cough associated with swallowing.

## 2022-06-17 NOTE — Assessment & Plan Note (Signed)
heart rate is in control, on Digoxin. Dig level 1.2 07/09/21, takes Plavix

## 2022-06-17 NOTE — Assessment & Plan Note (Signed)
takes Pravastatin, LDL 76 10/22/21

## 2022-06-17 NOTE — Assessment & Plan Note (Signed)
No chest pain since last visited, takes Plavix, Isosorbide, prn NTG

## 2022-06-17 NOTE — Assessment & Plan Note (Signed)
stable, on Dutasteride 0.5mg qd 

## 2022-06-17 NOTE — Assessment & Plan Note (Signed)
stable, on Levothyroxine 50mcg qd. TSH 3.12 10/22/21 

## 2022-06-17 NOTE — Assessment & Plan Note (Signed)
Stable,  takes Pantoprazole, Hgb 13.1 10/22/21

## 2022-06-17 NOTE — Assessment & Plan Note (Addendum)
BLE erythema, thick scaly skin BLE, sensitive to touch, persisted, dc Nystatin/Triamcinolone cream, f/u [facility].

## 2022-06-19 ENCOUNTER — Encounter: Payer: Self-pay | Admitting: Nurse Practitioner

## 2022-06-29 DIAGNOSIS — I480 Paroxysmal atrial fibrillation: Secondary | ICD-10-CM | POA: Diagnosis not present

## 2022-08-11 ENCOUNTER — Encounter: Payer: Self-pay | Admitting: Adult Health

## 2022-08-11 ENCOUNTER — Non-Acute Institutional Stay: Payer: Medicare Other | Admitting: Adult Health

## 2022-08-11 DIAGNOSIS — B372 Candidiasis of skin and nail: Secondary | ICD-10-CM

## 2022-08-11 DIAGNOSIS — Z8673 Personal history of transient ischemic attack (TIA), and cerebral infarction without residual deficits: Secondary | ICD-10-CM | POA: Diagnosis not present

## 2022-08-11 MED ORDER — NYSTATIN 100000 UNIT/GM EX POWD: 1.0000 | Freq: Three times a day (TID) | CUTANEOUS | 3 refills | Status: DC

## 2022-08-11 NOTE — Progress Notes (Signed)
Location:  [facility] Room Number: NO/813/A Place of Service:  [facility] (13) Provider:  Durenda Age, DNP, FNP-BC  Patient Care Team: Virgie Dad, MD as PCP - General (Internal Medicine) Danella Sensing, MD as Consulting Physician (Dermatology) Mast, Man X, NP as Nurse Practitioner (Internal Medicine)  Extended Emergency Contact Information Primary Emergency Contact: Hession,Bill Address: [address]          [place], [place] [address] [address]nette Litter [place] Phone: [phone] Mobile Phone: [phone] Relation: Brother Secondary Emergency Contact: Rosaland [place] of [place] Mobile Phone: [phone] Relation: Relative  Code Status:  DNR  Goals of care: Advanced Directive information    04/16/2022    2:49 PM  Advanced Directives  Does Patient Have a Medical Advance Directive? Yes  Type of Paramedic of [place];Out of facility DNR (pink MOST or yellow form)  Does patient want to make changes to medical advance directive? No - Patient declined  Copy of [place] in Chart? Yes - validated most recent copy scanned in chart (See row information)     Chief Complaint  Patient presents with   Acute Visit    Patient is being seen for rashes    HPI:  Cody [name] is a 89 y.o. male seen today for an acute visit regarding rashes on his right upper abdominal folds. He was seen in his room today sitting on his wheelchair. Rashes on right upper abdominal folds are erythematous and moist. He has a PMH of hypertension, BPH and CVA. He is a resident of [facility].   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   Cervical spondylosis without myelopathy 08/22/2015    Diverticulosis    Diverticulosis of colon without hemorrhage 08/22/2015   Edema 08/18/2015   Fall 07/17/2015   Gait disturbance 08/22/2015   GERD (gastroesophageal reflux disease)    occurs rarely   History of CVA (cerebrovascular accident) 07/17/2015   HLD (hyperlipidemia) 07/17/2015   Hyperlipidemia    Hypertension    Inguinal hernia    Left carotid bruit    Melanoma ([place])    left forearm   Osteopenia    Paroxysmal atrial fibrillation ([place])    Physical deconditioning 08/22/2015   Retinal hemorrhage 04/24/2014   Rosacea blepharoconjunctivitis 11/20/2011   Stroke ([place]) 3/07   affected balance   Tubular adenoma of colon 2007   Weakness 07/17/2015   Past Surgical History:  Procedure Laterality Date   COLONOSCOPY  2008   EYE SURGERY Right    cataract extraction with IOL   INGUINAL HERNIA REPAIR Right 03/15/2013   Procedure: HERNIA REPAIR INGUINAL ADULT;  Surgeon: Odis Hollingshead, MD;  [facility];  Service: General;  Laterality: Right;   INSERTION OF MESH Right 03/15/2013   Procedure: INSERTION OF MESH;  Surgeon: Odis Hollingshead, MD;  [facility];  Service: General;  Laterality: Right;   left forearm melanoma     PROSTATE BIOPSY     SKIN SURGERY  08/04/2020   TONSILLECTOMY  1933    Allergies  Allergen Reactions   Aspirin    Eggs Or Egg-Derived Products Swelling    throat    Outpatient Encounter Medications as of 08/11/2022  Medication Sig   ammonium lactate (LAC-HYDRIN) 12 % lotion Apply 1 application. topically at bedtime.   cholecalciferol (VITAMIN D) 400 units TABS tablet Take 400 Units by mouth 2 (two) times daily.   clopidogrel (PLAVIX) 75 MG tablet Take 75 mg by mouth daily. Start PLAVIX from 07/24/15 if no further hematuria is reported by patient and HB > 12.   digoxin (LANOXIN) 0.125 MG tablet Take 0.125 mg by mouth daily.   dutasteride (AVODART) 0.5 MG capsule Take 0.5 mg by mouth daily.    furosemide (LASIX) 20 MG tablet Take 20 mg by mouth every other  day.   isosorbide mononitrate (IMDUR) 30 MG 24 hr tablet Take 0.5 tablets (15 mg total) by mouth daily.   levothyroxine (SYNTHROID, LEVOTHROID) 50 MCG tablet Take 50 mcg by mouth daily before breakfast.   nitroGLYCERIN (NITROSTAT) 0.4 MG SL tablet Place 1 tablet under the tongue as needed.   pantoprazole (PROTONIX) 40 MG tablet Take 1 tablet (40 mg total) by mouth daily.   polyethylene glycol (MIRALAX / GLYCOLAX) 17 g packet Take 17 g by mouth daily as needed.   POTASSIUM CHLORIDE ER PO Take 1 capsule by mouth every other day.   pravastatin (PRAVACHOL) 20 MG tablet Take 1 tablet by mouth at bedtime.   sodium fluoride (PREVIDENT 5000 PLUS) 1.1 % CREA dental cream Place 1 Application onto teeth every evening.   No facility-administered encounter medications on file as of 08/11/2022.    Review of Systems  Constitutional:  Negative for activity change, appetite change and fever.  HENT:  Negative for sore throat.   Eyes: Negative.   Cardiovascular:  Negative for chest pain and leg swelling.  Gastrointestinal:  Negative for abdominal distention, diarrhea and vomiting.  Genitourinary:  Negative for dysuria, frequency and urgency.  Skin:  Positive for rash. Negative for color change.  Neurological:  Negative for dizziness and headaches.  Psychiatric/Behavioral:  Negative for behavioral problems and sleep disturbance. The patient is not nervous/anxious.       Immunization History  Administered Date(s) Administered   Moderna Sars-Covid-2 Vaccination 07/06/2019, 08/02/2019, 05/06/2020, 11/25/2020   PPD Test 07/21/2015, 08/04/2015   Pfizer Covid-19 Vaccine Bivalent Booster 74yr & up 04/29/2022   Tdap 07/16/2015   Unspecified SARS-COV-2 Vaccination 03/17/2021   Zoster Recombinat (Shingrix) 12/04/2013   Pertinent  Health Maintenance Due  Topic Date Due   INFLUENZA VACCINE  Discontinued      03/06/2020   11:12 AM 07/04/2020   11:15 AM 08/05/2020    2:48 PM 09/12/2020   10:19 AM 04/16/2022     2:46 PM  [place] the past year? 0 0 0 0 0  Was there an injury with Fall?   0  0  Fall Risk Category Calculator   0  0  Fall Risk Category (Retired)   Low  Low  (RETIRED) Patient Fall Risk Level   Low fall risk  Low fall risk  Patient at Risk for Falls Due to     No Fall Risks  Fall risk Follow up     Falls evaluation completed     Vitals:   08/11/22 1357  BP: 113/63  Pulse: 62  Resp: 16  Temp: 97.8 F (36.6 C)  SpO2: 94%  Weight: 138 lb 6.4 oz (62.8 kg)  Height: 6' 1"$  (1.854 m)   Body mass index is 18.26 kg/m.  Physical Exam Constitutional:      Appearance: Normal appearance.  HENT:     Head:  Normocephalic and atraumatic.     Mouth/Throat:     Mouth: Mucous membranes are moist.  Eyes:     Conjunctiva/sclera: Conjunctivae normal.  Cardiovascular:     Rate and Rhythm: Normal rate. Rhythm irregular.     Pulses: Normal pulses.     Heart sounds: Normal heart sounds.  Pulmonary:     Effort: Pulmonary effort is normal.     Breath sounds: Normal breath sounds.  Abdominal:     General: Bowel sounds are normal.     Palpations: Abdomen is soft.  Musculoskeletal:        General: Swelling present. Normal range of motion.     Cervical back: Normal range of motion.     Right lower leg: Edema present.     Left lower leg: Edema present.     Comments: BLE 1+edema  Skin:    General: Skin is warm and dry.     Findings: Rash present.     Comments: Moist erythematous rashes on Right upper abdominal folds  Neurological:     General: No focal deficit present.     Mental Status: He is alert and oriented to person, place, and time.  Psychiatric:        Mood and Affect: Mood normal.        Behavior: Behavior normal.        Thought Content: Thought content normal.        Judgment: Judgment normal.      Labs reviewed: Recent Labs    10/22/21 0000  NA 141  K 4.5  CL 103  CO2 34*  BUN 29*  CREATININE 1.0  CALCIUM 9.4   Recent Labs    10/22/21 0000  AST  13*  ALT 6*  ALKPHOS 66  ALBUMIN 3.5   Recent Labs    10/22/21 0000  WBC 3.9  NEUTROABS 1,720.00  HGB 13.1*  HCT 40*  PLT 212   Lab Results  Component Value Date   TSH 3.12 10/22/2021   No results found for: "HGBA1C" Lab Results  Component Value Date   CHOL 133 10/22/2021   HDL 43 10/22/2021   LDLCALC 76 10/22/2021   TRIG 59 10/22/2021   CHOLHDL 2.6 07/10/2019    Significant Diagnostic Results in last 30 days:  No results found.  Assessment/Plan  1. Candidal skin infection -  keep skin clean and dry - nystatin (MYCOSTATIN/NYSTOP) powder; Apply 1 Application topically 3 (three) times daily.  Dispense: 60 g; Refill: 3  2. History of CVA (cerebrovascular accident) -  stable, -  continue Plavix and Simvastatin    Family/ staff Communication: Discussed plan of care with resident and charge nurse.  Labs/tests ordered: None    Durenda Age, DNP, MSN, FNP-BC [facility] [phone] (Monday-Friday 8:00 a.m. - 5:00 p.m.) [phone] (after hours)

## 2022-08-16 ENCOUNTER — Non-Acute Institutional Stay: Payer: Medicare Other | Admitting: Nurse Practitioner

## 2022-08-16 ENCOUNTER — Encounter: Payer: Self-pay | Admitting: Nurse Practitioner

## 2022-08-16 DIAGNOSIS — R609 Edema, unspecified: Secondary | ICD-10-CM

## 2022-08-16 DIAGNOSIS — I739 Peripheral vascular disease, unspecified: Secondary | ICD-10-CM | POA: Diagnosis not present

## 2022-08-16 DIAGNOSIS — E039 Hypothyroidism, unspecified: Secondary | ICD-10-CM | POA: Diagnosis not present

## 2022-08-16 DIAGNOSIS — K219 Gastro-esophageal reflux disease without esophagitis: Secondary | ICD-10-CM | POA: Diagnosis not present

## 2022-08-16 DIAGNOSIS — N1831 Chronic kidney disease, stage 3a: Secondary | ICD-10-CM | POA: Diagnosis not present

## 2022-08-16 DIAGNOSIS — I209 Angina pectoris, unspecified: Secondary | ICD-10-CM | POA: Diagnosis not present

## 2022-08-16 DIAGNOSIS — B372 Candidiasis of skin and nail: Secondary | ICD-10-CM

## 2022-08-16 DIAGNOSIS — N400 Enlarged prostate without lower urinary tract symptoms: Secondary | ICD-10-CM | POA: Diagnosis not present

## 2022-08-16 DIAGNOSIS — I480 Paroxysmal atrial fibrillation: Secondary | ICD-10-CM

## 2022-08-16 NOTE — Assessment & Plan Note (Signed)
Edema BLE, on Furosemide, chronic swelling.

## 2022-08-16 NOTE — Progress Notes (Signed)
Location:   [facility] Room Number: [id number] Place of Service:  ALF (13) Provider: Lennie Odor Tashauna Caisse NP  Virgie Dad, MD  Patient Care Team: Virgie Dad, MD as PCP - General (Internal Medicine) Danella Sensing, MD as Consulting Physician (Dermatology) Chryl Holten X, NP as Nurse Practitioner (Internal Medicine)  Extended Emergency Contact Information Primary Emergency Contact: Rieger,Bill Address: [address]          [place], [place] [address] Johnnette Litter of [place] Phone: [phone] Mobile Phone: [phone] Relation: Brother Secondary Emergency Contact: [place] of [place] Mobile Phone: [phone] Relation: Relative  Code Status: DNR Goals of care: Advanced Directive information    04/16/2022    2:49 PM  Advanced Directives  Does Patient Have a Medical Advance Directive? Yes  Type of Paramedic of [place];Out of facility DNR (pink MOST or yellow form)  Does patient want to make changes to medical advance directive? No - Patient declined  Copy of [place] in Chart? Yes - validated most recent copy scanned in chart (See row information)     Chief Complaint  Patient presents with   Acute Visit    rash    HPI:  Pt is a 89 y.o. male seen today for an acute visit for rash under the breasts skin folds and in the groins folds, beefy redness, satellite pattern at the margin of reddened area.   Venous dermatitis, BLE erythema, thick scaly skin BLE, sensitive to touch, persisted             Edema BLE, on Furosemide, chronic swelling.              The right buttock pressure ulcer  is healed with a scar tissue, short lived pain in the area come and goes, positional             Hx of Afib, heart rate is in control, on Digoxin. Dig level 1.2 06/29/22 , takes Plavix             Hypothyroidism, stable, on Levothyroxine 16mg qd. TSH 3.12 10/22/21             Urinary frequency, stable, on Dutasteride 0.57m qd             CKD Bun/cret 38/1.37 06/15/22             Dysphagia, declined ST or MBSS, cough associated with swallowing.              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   Cervical spondylosis without myelopathy 08/22/2015   Diverticulosis    Diverticulosis of colon without hemorrhage 08/22/2015   Edema 08/18/2015   Fall 07/17/2015   Gait disturbance 08/22/2015   GERD (gastroesophageal reflux disease)    occurs rarely   History of CVA (cerebrovascular accident) 07/17/2015   HLD (hyperlipidemia) 07/17/2015   Hyperlipidemia    Hypertension    Inguinal hernia    Left  carotid bruit    Melanoma ([place])    left forearm   Osteopenia    Paroxysmal atrial fibrillation ([place])    Physical deconditioning 08/22/2015   Retinal hemorrhage 04/24/2014   Rosacea blepharoconjunctivitis 11/20/2011   Stroke ([facility]) 3/07   affected balance   Tubular adenoma of colon 2007   Weakness 07/17/2015   Past Surgical History:  Procedure Laterality Date   COLONOSCOPY  2008   EYE SURGERY Right    cataract extraction with IOL   INGUINAL HERNIA REPAIR Right 03/15/2013   Procedure: HERNIA REPAIR INGUINAL ADULT;  Surgeon: Odis Hollingshead, MD;  Location: [facility];  Service: General;  Laterality: Right;   INSERTION OF MESH Right 03/15/2013   Procedure: INSERTION OF MESH;  Surgeon: Odis Hollingshead, MD;  Location: [facility];  Service: General;  Laterality: Right;   left forearm melanoma     PROSTATE BIOPSY     SKIN SURGERY  08/04/2020   TONSILLECTOMY  1933    Allergies  Allergen Reactions   Aspirin    Eggs Or Egg-Derived  Products Swelling    throat    Allergies as of 08/16/2022       Reactions   Aspirin    Eggs Or Egg-derived Products Swelling   throat        Medication List        Accurate as of August 16, 2022 12:12 PM. If you have any questions, ask your nurse or doctor.          ammonium lactate 12 % lotion Commonly known as: LAC-HYDRIN Apply 1 application. topically at bedtime.   cholecalciferol 10 MCG (400 UNIT) Tabs tablet Commonly known as: VITAMIN D3 Take 400 Units by mouth 2 (two) times daily.   clopidogrel 75 MG tablet Commonly known as: PLAVIX Take 75 mg by mouth daily. Start PLAVIX from 07/24/15 if no further hematuria is reported by patient and HB > 12.   digoxin 0.125 MG tablet Commonly known as: LANOXIN Take 0.125 mg by mouth daily.   dutasteride 0.5 MG capsule Commonly known as: AVODART Take 0.5 mg by mouth daily.   furosemide 20 MG tablet Commonly known as: LASIX Take 20 mg by mouth every other day.   isosorbide mononitrate 30 MG 24 hr tablet Commonly known as: IMDUR Take 0.5 tablets (15 mg total) by mouth daily.   levothyroxine 50 MCG tablet Commonly known as: SYNTHROID Take 50 mcg by mouth daily before breakfast.   nitroGLYCERIN 0.4 MG SL tablet Commonly known as: NITROSTAT Place 1 tablet under the tongue as needed.   nystatin powder Commonly known as: MYCOSTATIN/NYSTOP Apply 1 Application topically 3 (three) times daily.   pantoprazole 40 MG tablet Commonly known as: PROTONIX Take 1 tablet (40 mg total) by mouth daily.   polyethylene glycol 17 g packet Commonly known as: MIRALAX / GLYCOLAX Take 17 g by mouth daily as needed.   POTASSIUM CHLORIDE ER PO Take 1 capsule by mouth every other day.   pravastatin 20 MG tablet Commonly known as: PRAVACHOL Take 1 tablet by mouth at bedtime.   sodium fluoride 1.1 % Crea dental cream Commonly known as: PREVIDENT 5000 PLUS Place 1 Application onto teeth every evening.        Review of  Systems  Constitutional:  Negative for fatigue, fever and unexpected weight change.  HENT:  Positive for hearing loss and trouble swallowing. Negative for congestion.        Cough while eating.   Eyes:  Negative for  visual disturbance.  Respiratory:  Negative for cough, chest tightness, shortness of breath and wheezing.   Cardiovascular:  Positive for leg swelling. Negative for chest pain and palpitations.  Gastrointestinal:  Negative for abdominal pain and constipation.  Genitourinary:  Positive for frequency. Negative for dysuria and urgency.  Musculoskeletal:  Positive for arthralgias and gait problem.  Skin:  Positive for rash.       Right buttock scar tissue from healed previous pressure ulcer. Thick scaly skin, erythematous swelling BLE  Neurological:  Negative for speech difficulty, weakness and headaches.  Psychiatric/Behavioral:  Negative for behavioral problems and sleep disturbance. The patient is not nervous/anxious.     Immunization History  Administered Date(s) Administered   Moderna Sars-Covid-2 Vaccination 07/06/2019, 08/02/2019, 05/06/2020, 11/25/2020   PPD Test 07/21/2015, 08/04/2015   Pfizer Covid-19 Vaccine Bivalent Booster 58yr & up 04/29/2022   Tdap 07/16/2015   Unspecified SARS-COV-2 Vaccination 03/17/2021   Zoster Recombinat (Shingrix) 12/04/2013   Pertinent  Health Maintenance Due  Topic Date Due   INFLUENZA VACCINE  Discontinued      03/06/2020   11:12 AM 07/04/2020   11:15 AM 08/05/2020    2:48 PM 09/12/2020   10:19 AM 04/16/2022    2:46 PM  FDevolain the past year? 0 0 0 0 0  Was there an injury with Fall?   0  0  Fall Risk Category Calculator   0  0  Fall Risk Category (Retired)   Low  Low  (RETIRED) Patient Fall Risk Level   Low fall risk  Low fall risk  Patient at Risk for Falls Due to     No Fall Risks  Fall risk Follow up     Falls evaluation completed   Functional Status Survey:    Vitals:   08/16/22 1159  BP: (!) 131/57  Pulse:  65  Resp: 18  Temp: (!) 97.5 F (36.4 C)  SpO2: 96%  Weight: 138 lb 6.4 oz (62.8 kg)   Body mass index is 18.26 kg/m. Physical Exam Vitals and nursing note reviewed.  Constitutional:      Appearance: Normal appearance.  HENT:     Head: Normocephalic and atraumatic.     Mouth/Throat:     Mouth: Mucous membranes are moist.  Eyes:     Extraocular Movements: Extraocular movements intact.     Conjunctiva/sclera: Conjunctivae normal.     Pupils: Pupils are equal, round, and reactive to light.  Cardiovascular:     Rate and Rhythm: Normal rate. Rhythm irregular.     Heart sounds: No murmur heard. Pulmonary:     Effort: Pulmonary effort is normal.     Breath sounds: Rales present.     Comments: Bibasilar rales, L>R Abdominal:     Palpations: Abdomen is soft.     Tenderness: There is no abdominal tenderness.     Comments: Irregular schedule BM, but no constipation.   Musculoskeletal:     Cervical back: Normal range of motion and neck supple.     Right lower leg: Edema present.     Left lower leg: Edema present.     Comments: 1+ edema BLE  Skin:    General: Skin is warm and dry.     Findings: Erythema and rash present.     Comments: Chronic venous insufficiency skin changes. Thick scaly skin, erythematous swelling BLE, mild warmth and sensitive to touch Right buttock scar tissue from healed previous pressure ulcer. Lateral left lower leg skin lesion.  rash under the breasts skin folds and in the groins folds, beefy redness, satellite pattern at the margin of reddened area.   Neurological:     General: No focal deficit present.     Mental Status: He is alert and oriented to person, place, and time. Mental status is at baseline.     Gait: Gait abnormal.  Psychiatric:        Mood and Affect: Mood normal.        Behavior: Behavior normal.        Thought Content: Thought content normal.        Judgment: Judgment normal.     Labs reviewed: Recent Labs    10/22/21 0000  NA  141  K 4.5  CL 103  CO2 34*  BUN 29*  CREATININE 1.0  CALCIUM 9.4   Recent Labs    10/22/21 0000  AST 13*  ALT 6*  ALKPHOS 66  ALBUMIN 3.5   Recent Labs    10/22/21 0000  WBC 3.9  NEUTROABS 1,720.00  HGB 13.1*  HCT 40*  PLT 212   Lab Results  Component Value Date   TSH 3.12 10/22/2021   No results found for: "HGBA1C" Lab Results  Component Value Date   CHOL 133 10/22/2021   HDL 43 10/22/2021   LDLCALC 76 10/22/2021   TRIG 59 10/22/2021   CHOLHDL 2.6 07/10/2019    Significant Diagnostic Results in last 30 days:  No results found.  Assessment/Plan: Candidal skin infection rash under the breasts skin folds and in the groins folds, beefy redness, satellite pattern at the margin of reddened area.  Apply 0.5% Triamcinolone and Nystatin cream bid to affected areas until healed. Diflucan 120m qd po x 3 days, assist the patient and personal hygiene.   PVD (peripheral vascular disease) (HCC) Venous dermatitis, BLE erythema, thick scaly skin BLE, sensitive to touch, persisted, pending dermatology evaluation.   Edema Edema BLE, on Furosemide, chronic swelling.   Paroxysmal atrial fibrillation (HCC) Hx of Afib, heart rate is in control, on Digoxin. Dig level 1.2 06/29/22 , takes Plavix  Acquired hypothyroidism stable, on Levothyroxine 590m qd. TSH 3.12 10/22/21  BPH (benign prostatic hyperplasia) stable, on Dutasteride 0.17m38md  CKD (chronic kidney disease) stage 3, GFR 30-59 ml/min (HCC)  Bun/cret 38/1.37 06/15/22  GERD (gastroesophageal reflux disease) takes Pantoprazole, Hgb 13.1 10/22/21  Angina pectoris (HCCSedilloCAD/angina pectoris, takes Plavix, Isosorbide, prn NTG    Family/ staff Communication: plan of care reviewed with the patient and charge nurse   Labs/tests ordered:  none  Time spend 40 minutes.

## 2022-08-16 NOTE — Assessment & Plan Note (Signed)
rash under the breasts skin folds and in the groins folds, beefy redness, satellite pattern at the margin of reddened area.  Apply 0.5% Triamcinolone and Nystatin cream bid to affected areas until healed. Diflucan 177m qd po x 3 days, assist the patient and personal hygiene.

## 2022-08-16 NOTE — Assessment & Plan Note (Signed)
takes Pantoprazole, Hgb 13.1 10/22/21

## 2022-08-16 NOTE — Assessment & Plan Note (Signed)
stable, on Levothyroxine 56mg qd. TSH 3.12 10/22/21

## 2022-08-16 NOTE — Assessment & Plan Note (Signed)
Bun/cret 38/1.37 06/15/22

## 2022-08-16 NOTE — Assessment & Plan Note (Signed)
Venous dermatitis, BLE erythema, thick scaly skin BLE, sensitive to touch, persisted, pending dermatology evaluation.

## 2022-08-16 NOTE — Assessment & Plan Note (Signed)
Hx of Afib, heart rate is in control, on Digoxin. Dig level 1.2 06/29/22 , takes Plavix

## 2022-08-16 NOTE — Assessment & Plan Note (Signed)
stable, on Dutasteride 0.58m qd

## 2022-08-16 NOTE — Assessment & Plan Note (Signed)
CAD/angina pectoris, takes Plavix, Isosorbide, prn NTG

## 2022-09-08 DIAGNOSIS — D485 Neoplasm of uncertain behavior of skin: Secondary | ICD-10-CM | POA: Diagnosis not present

## 2022-09-08 DIAGNOSIS — I8312 Varicose veins of left lower extremity with inflammation: Secondary | ICD-10-CM | POA: Diagnosis not present

## 2022-09-08 DIAGNOSIS — L905 Scar conditions and fibrosis of skin: Secondary | ICD-10-CM | POA: Diagnosis not present

## 2022-09-08 DIAGNOSIS — I8311 Varicose veins of right lower extremity with inflammation: Secondary | ICD-10-CM | POA: Diagnosis not present

## 2022-09-08 DIAGNOSIS — L738 Other specified follicular disorders: Secondary | ICD-10-CM | POA: Diagnosis not present

## 2022-09-08 DIAGNOSIS — C44229 Squamous cell carcinoma of skin of left ear and external auricular canal: Secondary | ICD-10-CM | POA: Diagnosis not present

## 2022-09-08 DIAGNOSIS — Z85820 Personal history of malignant melanoma of skin: Secondary | ICD-10-CM | POA: Diagnosis not present

## 2022-09-08 DIAGNOSIS — I872 Venous insufficiency (chronic) (peripheral): Secondary | ICD-10-CM | POA: Diagnosis not present

## 2022-09-08 DIAGNOSIS — L570 Actinic keratosis: Secondary | ICD-10-CM | POA: Diagnosis not present

## 2022-09-08 DIAGNOSIS — Z85828 Personal history of other malignant neoplasm of skin: Secondary | ICD-10-CM | POA: Diagnosis not present

## 2022-10-05 ENCOUNTER — Non-Acute Institutional Stay: Payer: Medicare Other | Admitting: Family Medicine

## 2022-10-05 DIAGNOSIS — E039 Hypothyroidism, unspecified: Secondary | ICD-10-CM | POA: Diagnosis not present

## 2022-10-05 DIAGNOSIS — R609 Edema, unspecified: Secondary | ICD-10-CM

## 2022-10-05 DIAGNOSIS — R1312 Dysphagia, oropharyngeal phase: Secondary | ICD-10-CM

## 2022-10-05 DIAGNOSIS — I480 Paroxysmal atrial fibrillation: Secondary | ICD-10-CM

## 2022-10-05 DIAGNOSIS — N400 Enlarged prostate without lower urinary tract symptoms: Secondary | ICD-10-CM | POA: Diagnosis not present

## 2022-10-05 DIAGNOSIS — N1831 Chronic kidney disease, stage 3a: Secondary | ICD-10-CM

## 2022-10-05 DIAGNOSIS — I872 Venous insufficiency (chronic) (peripheral): Secondary | ICD-10-CM

## 2022-10-05 DIAGNOSIS — R413 Other amnesia: Secondary | ICD-10-CM

## 2022-10-05 NOTE — Progress Notes (Signed)
Provider:  Jacalyn Lefevre, MD Location:      Place of Service:     PCP: Mahlon Gammon, MD Patient Care Team: Mahlon Gammon, MD as PCP - General (Internal Medicine) Arminda Resides, MD as Consulting Physician (Dermatology) Mast, Man X, NP as Nurse Practitioner (Internal Medicine)  Extended Emergency Contact Information Primary Emergency Contact: Manuelito,Bill Address: [address]          [place], [place] [address] Darden [place] of [place] Home Phone: [phone] Mobile Phone: [phone] Relation: Brother Secondary Emergency Contact: Isla Pence [place] of [place] Mobile Phone: [phone] Relation: Relative  Code Status:  Goals of Care: Advanced Directive information    04/16/2022    2:49 PM  Advanced Directives  Does Patient Have a Medical Advance Directive? Yes  Type of Estate agent of [place];Out of facility DNR (pink MOST or yellow form)  Does patient want to make changes to medical advance directive? No - Patient declined  Copy of Healthcare Power of Attorney in Chart? Yes - validated most recent copy scanned in chart (See row information)      No chief complaint on file.   HPI: Patient is a 89 y.o. male seen today for medical management of chronic problems including A-fib, senile dementia, BPH with urinary frequency, dysphagia, and coronary artery disease. Patient continues to spend almost all his time in his room.  He does not take meals in the dining room as he had a severe coughing episode after getting choked 1 time and he does not want to embarrass himself or others with symptoms like that.  He has discovered that if he eats more slowly he has less problems with swallowing.  I suggested tucking his chin and following each bite of food with a sip of water as well.  He has probably heard that before but did not remember. He continues to express desire to not live longer.  He does enjoy staff and assisted living.  He made the  comment that he is not afraid of dying, but he is afraid of living.  His family includes his son who lives in [place] and a daughter who no longer speaks to him for reasons that he does not understand or recall. He seemed to get a little frustrated today as he had some things that he knew he wanted to talk about but could not recall what they were.  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   Cervical spondylosis without myelopathy 08/22/2015   Diverticulosis    Diverticulosis of colon without hemorrhage 08/22/2015   Edema 08/18/2015   Fall 07/17/2015   Gait disturbance 08/22/2015   GERD (gastroesophageal reflux disease)    occurs rarely   History of CVA (cerebrovascular accident) 07/17/2015   HLD (hyperlipidemia) 07/17/2015   Hyperlipidemia    Hypertension    Inguinal hernia    Left carotid bruit    Melanoma (HCC)    left forearm   Osteopenia    Paroxysmal atrial fibrillation (HCC)    Physical deconditioning 08/22/2015   Retinal hemorrhage 04/24/2014   Rosacea blepharoconjunctivitis 11/20/2011   Stroke (HCC) 3/07   affected balance   Tubular adenoma of colon 2007   Weakness 07/17/2015   Past Surgical History:  Procedure  Laterality Date   COLONOSCOPY  2008   EYE SURGERY Right    cataract extraction with IOL   INGUINAL HERNIA REPAIR Right 03/15/2013   Procedure: HERNIA REPAIR INGUINAL ADULT;  Surgeon: Adolph Pollack, MD;  Location: [facility];  Service: General;  Laterality: Right;   INSERTION OF MESH Right 03/15/2013   Procedure: INSERTION OF MESH;  Surgeon: Adolph Pollack, MD;  Location: [facility];  Service: General;  Laterality: Right;   left forearm melanoma     PROSTATE BIOPSY     SKIN SURGERY  08/04/2020   TONSILLECTOMY  1933    reports that he quit smoking  about 49 years ago. His smoking use included cigarettes. He has never used smokeless tobacco. He reports that he does not drink alcohol and does not use drugs. Social History   Socioeconomic History   Marital status: Divorced    Spouse name: Not on file   Number of children: Not on file   Years of education: Not on file   Highest education level: Not on file  Occupational History   Occupation: Retired Airline pilot    Comment: Sales  Tobacco Use   Smoking status: Former    Types: Cigarettes    Quit date: 11/05/1972    Years since quitting: 49.9   Smokeless tobacco: Never  Vaping Use   Vaping Use: Never used  Substance and Sexual Activity   Alcohol use: No    Comment:  3 ounces wine nightly   Drug use: No   Sexual activity: Not on file  Other Topics Concern   Not on file  Social History Narrative   Diet? Regular/normal      Do you drink/eat things with caffeine? no      Marital status?      divorced                              What year were you married? 1957      Do you live in a house, apartment, assisted living, condo, trailer, etc.? Apartment. Moved to [place] 08/21/2015      Is it one or more stories? 2      How many persons live in your home? Live alone      Do you have any pets in your home? (please list) no      Current or past profession: Sales      Do you exercise?      "I do now."                            Type & how often? Exercise bike, 3 times weekly      Do you have a living will? no      Do you have a DNR form?    yes                              If not, do you want to discuss one?      Do you have signed POA/HPOA for forms?                Social Determinants of Health   Financial Resource Strain: Not on file  Food Insecurity: Not on file  Transportation Needs: Not on file  Physical Activity: Not on file  Stress: Not on file  Social Connections: Not on  file  Intimate Partner Violence: Not on file    Functional Status Survey:    Family History   Problem Relation Age of Onset   Cancer Mother        lung and breast   Heart disease Father        MI   Cancer Sister        pancreatic    Health Maintenance  Topic Date Due   Pneumonia Vaccine 7365+ Years old (1 of 1 - PCV) Never done   Zoster Vaccines- Shingrix (2 of 2) 01/29/2014   Medicare Annual Wellness (AWV)  08/05/2021   COVID-19 Vaccine (7 - 2023-24 season) 06/24/2022   DTaP/Tdap/Td (2 - Td or Tdap) 07/15/2025   HPV VACCINES  Aged Out   INFLUENZA VACCINE  Discontinued    Allergies  Allergen Reactions   Aspirin    Egg-Derived Products Swelling    throat    Outpatient Encounter Medications as of 10/05/2022  Medication Sig   ammonium lactate (LAC-HYDRIN) 12 % lotion Apply 1 application. topically at bedtime.   cholecalciferol (VITAMIN D) 400 units TABS tablet Take 400 Units by mouth 2 (two) times daily.   clopidogrel (PLAVIX) 75 MG tablet Take 75 mg by mouth daily. Start PLAVIX from 07/24/15 if no further hematuria is reported by patient and HB > 12.   digoxin (LANOXIN) 0.125 MG tablet Take 0.125 mg by mouth daily.   dutasteride (AVODART) 0.5 MG capsule Take 0.5 mg by mouth daily.    furosemide (LASIX) 20 MG tablet Take 20 mg by mouth every other day.   isosorbide mononitrate (IMDUR) 30 MG 24 hr tablet Take 0.5 tablets (15 mg total) by mouth daily.   levothyroxine (SYNTHROID, LEVOTHROID) 50 MCG tablet Take 50 mcg by mouth daily before breakfast.   nitroGLYCERIN (NITROSTAT) 0.4 MG SL tablet Place 1 tablet under the tongue as needed.   nystatin (MYCOSTATIN/NYSTOP) powder Apply 1 Application topically 3 (three) times daily.   pantoprazole (PROTONIX) 40 MG tablet Take 1 tablet (40 mg total) by mouth daily.   polyethylene glycol (MIRALAX / GLYCOLAX) 17 g packet Take 17 g by mouth daily as needed.   POTASSIUM CHLORIDE ER PO Take 1 capsule by mouth every other day.   pravastatin (PRAVACHOL) 20 MG tablet Take 1 tablet by mouth at bedtime.   sodium fluoride (PREVIDENT 5000 PLUS)  1.1 % CREA dental cream Place 1 Application onto teeth every evening.   No facility-administered encounter medications on file as of 10/05/2022.    Review of Systems  Constitutional: Negative.   HENT: Negative.    Respiratory: Negative.    Cardiovascular:  Positive for leg swelling.  Gastrointestinal:        History of dysphagia  Genitourinary:  Positive for frequency.  Musculoskeletal:  Positive for gait problem.  Neurological:  Positive for speech difficulty.  All other systems reviewed and are negative.   There were no vitals filed for this visit. There is no height or weight on file to calculate BMI. Physical Exam Vitals and nursing note reviewed.  Constitutional:      Appearance: Normal appearance.  HENT:     Mouth/Throat:     Mouth: Mucous membranes are moist.     Pharynx: Oropharynx is clear.  Eyes:     Conjunctiva/sclera: Conjunctivae normal.     Pupils: Pupils are equal, round, and reactive to light.  Cardiovascular:     Rate and Rhythm: Normal rate and regular rhythm.  Pulmonary:     Effort:  Pulmonary effort is normal. No respiratory distress.     Breath sounds: Normal breath sounds.  Abdominal:     General: Bowel sounds are normal.     Palpations: Abdomen is soft.  Musculoskeletal:     Comments: Uses wheelchair for ambulation  Neurological:     General: No focal deficit present.     Mental Status: He is alert and oriented to person, place, and time.     Sensory: Sensory deficit present.  Psychiatric:        Mood and Affect: Mood normal.        Behavior: Behavior normal.     Labs reviewed: Basic Metabolic Panel: Recent Labs    10/22/21 0000  NA 141  K 4.5  CL 103  CO2 34*  BUN 29*  CREATININE 1.0  CALCIUM 9.4   Liver Function Tests: Recent Labs    10/22/21 0000  AST 13*  ALT 6*  ALKPHOS 66  ALBUMIN 3.5   No results for input(s): "LIPASE", "AMYLASE" in the last 8760 hours. No results for input(s): "AMMONIA" in the last 8760  hours. CBC: Recent Labs    10/22/21 0000  WBC 3.9  NEUTROABS 1,720.00  HGB 13.1*  HCT 40*  PLT 212   Cardiac Enzymes: No results for input(s): "CKTOTAL", "CKMB", "CKMBINDEX", "TROPONINI" in the last 8760 hours. BNP: Invalid input(s): "POCBNP" No results found for: "HGBA1C" Lab Results  Component Value Date   TSH 3.12 10/22/2021   No results found for: "VITAMINB12" No results found for: "FOLATE" No results found for: "IRON", "TIBC", "FERRITIN"  Imaging and Procedures obtained prior to SNF admission: No results found.  Assessment/Plan 1. Acquired hypothyroidism Continues with levothyroxine 50 mcg.  TSH was in the normal range last April  2. Benign prostatic hyperplasia without lower urinary tract symptoms Continues to take dutasteride for BPH, but also thankful for depends as he cannot get to the bathroom every time he needs to void  3. Stage 3a chronic kidney disease Creatinine was 1.0 about 1 year ago.  He tells me that he drinks adequate amounts of water  4. Oropharyngeal dysphagia Had declined speech therapy consult previously but willing to see speech therapy now  5. Edema, unspecified type Continues with Lasix every other day as well as potassium  6. Impaired memory Patient's memory seems appropriate for age.  He does get frustrated sometimes trying to find the right word or recall something from the past  7. Paroxysmal atrial fibrillation Appears to be in sinus rhythm today.  He asked me what would happen if he stopped Lanoxin I explained that I could not say for sure but it would be safer to continue, although I do get the price and he would be interested in discontinuing the medicine in hopes that it might hasten his demise  8. Venous stasis dermatitis of both lower extremities Is some erythema today but edema is minimal    Family/ staff Communication:   Labs/tests ordered:  .[email or web address]

## 2022-10-29 ENCOUNTER — Non-Acute Institutional Stay: Payer: Medicare Other | Admitting: Nurse Practitioner

## 2022-10-29 ENCOUNTER — Encounter: Payer: Self-pay | Admitting: Nurse Practitioner

## 2022-10-29 DIAGNOSIS — K219 Gastro-esophageal reflux disease without esophagitis: Secondary | ICD-10-CM | POA: Diagnosis not present

## 2022-10-29 DIAGNOSIS — N400 Enlarged prostate without lower urinary tract symptoms: Secondary | ICD-10-CM | POA: Diagnosis not present

## 2022-10-29 DIAGNOSIS — E785 Hyperlipidemia, unspecified: Secondary | ICD-10-CM | POA: Diagnosis not present

## 2022-10-29 DIAGNOSIS — I872 Venous insufficiency (chronic) (peripheral): Secondary | ICD-10-CM

## 2022-10-29 DIAGNOSIS — R1312 Dysphagia, oropharyngeal phase: Secondary | ICD-10-CM | POA: Diagnosis not present

## 2022-10-29 DIAGNOSIS — N1831 Chronic kidney disease, stage 3a: Secondary | ICD-10-CM

## 2022-10-29 DIAGNOSIS — E039 Hypothyroidism, unspecified: Secondary | ICD-10-CM | POA: Diagnosis not present

## 2022-10-29 DIAGNOSIS — I209 Angina pectoris, unspecified: Secondary | ICD-10-CM | POA: Diagnosis not present

## 2022-10-29 DIAGNOSIS — I480 Paroxysmal atrial fibrillation: Secondary | ICD-10-CM

## 2022-10-29 DIAGNOSIS — B372 Candidiasis of skin and nail: Secondary | ICD-10-CM

## 2022-10-29 NOTE — Progress Notes (Unsigned)
Location:  [facility] Room Number: 813-A Place of Service:  ALF [phone]) Provider:  Caylan Chenard Dicie Beam    Patient Care Team: Mahlon Gammon, MD as PCP - General (Internal Medicine) Arminda Resides, MD as Consulting Physician (Dermatology) Ege Muckey X, NP as Nurse Practitioner (Internal Medicine)  Extended Emergency Contact Information Primary Emergency Contact: Didio,Bill Address: [address]          [place], [place] [address] [place] Home Phone: [phone] Mobile Phone: [phone] Relation: Brother Secondary Emergency Contact: Isla [place] of [place] Mobile Phone: [phone] Relation: Relative  Code Status:  DNR Goals of care: Advanced Directive information    10/29/2022   11:27 AM  Advanced Directives  Does Patient Have a Medical Advance Directive? Yes  Type of Estate agent of [place];Out of facility DNR (pink MOST or yellow form)  Does patient want to make changes to medical advance directive? No - Patient declined  Copy of Healthcare Power of Attorney in Chart? Yes - validated most recent copy scanned in chart (See row information)  Pre-existing out of facility DNR order (yellow form or pink MOST form) Yellow form placed in chart (order not valid for inpatient use)     Chief Complaint  Patient presents with   Acute Visit    Rash    HPI:  Pt is a 89 y.o. male seen today for an acute visit for reported rash on right side. No rash noted on his right side upon my examination, the patient stated it was itching of the R back, but its resolved, no apparent rash seen, the patient declined any treatment.    Near resolution of rash under the breasts skin folds, in the groins folds, beefy redness, satellite pattern at the margin of reddened area.              Venous dermatitis, improved BLE erythema, thick scaly skin BLE, sensitive to touch, s/p Dermatology eval, Aveeno moisturizer               Edema BLE, on Furosemide, chronic swelling.              The right buttock pressure ulcer  is healed with a scar tissue, short lived pain in the area come and goes, positional             Hx of Afib, heart rate is in control, on Digoxin. Dig level 1.2 06/29/22 , takes Plavix             Hypothyroidism, stable, on Levothyroxine qd. TSH 3.12 10/22/21             Urinary frequency, stable, on Dutasteride 0.5mg  qd             CKD Bun/cret 38/1.37 06/15/22             Dysphagia, declined ST or MBSS, cough associated with swallowing.              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   Cervical spondylosis without myelopathy 08/22/2015   Diverticulosis    Diverticulosis of colon without hemorrhage 08/22/2015   Edema 08/18/2015   Fall 07/17/2015   Gait disturbance 08/22/2015   GERD (gastroesophageal reflux disease)    occurs rarely   History of CVA (cerebrovascular accident) 07/17/2015   HLD (hyperlipidemia) 07/17/2015   Hyperlipidemia    Hypertension    Inguinal hernia    Left carotid bruit    Melanoma (HCC)    left forearm   Osteopenia    Paroxysmal atrial fibrillation (HCC)    Physical deconditioning 08/22/2015   Retinal hemorrhage 04/24/2014   Rosacea blepharoconjunctivitis 11/20/2011   Stroke (HCC) 3/07   affected balance   Tubular adenoma of colon 2007   Weakness 07/17/2015   Past Surgical History:  Procedure Laterality Date   COLONOSCOPY  2008   EYE SURGERY Right    cataract extraction with IOL   INGUINAL HERNIA REPAIR Right 03/15/2013   Procedure: HERNIA REPAIR INGUINAL ADULT;   Surgeon: Adolph Pollack, MD;  Location: [facility];  Service: General;  Laterality: Right;   INSERTION OF MESH Right 03/15/2013   Procedure: INSERTION OF MESH;  Surgeon: Adolph Pollack, MD;  Location: [facility];  Service: General;  Laterality: Right;   left forearm melanoma     PROSTATE BIOPSY     SKIN SURGERY  08/04/2020   TONSILLECTOMY  1933    Allergies  Allergen Reactions   Aspirin    Egg-Derived Products Swelling    throat    Outpatient Encounter Medications as of 10/29/2022  Medication Sig   ammonium lactate (LAC-HYDRIN) 12 % lotion Apply 1 application. topically at bedtime.   cholecalciferol (VITAMIN D) 400 units TABS tablet Take 400 Units by mouth 2 (two) times daily.   clopidogrel (PLAVIX) 75 MG tablet Take 75 mg by mouth daily. Start PLAVIX from 07/24/15 if no further hematuria is reported by patient and HB > 12.   digoxin (LANOXIN) 0.125 MG tablet Take 0.125 mg by mouth daily.   dutasteride (AVODART) 0.5 MG capsule Take 0.5 mg by mouth daily.    furosemide (LASIX) 40 MG tablet Take 40 mg by mouth daily.   isosorbide mononitrate (IMDUR) 30 MG 24 hr tablet Take 0.5 tablets (15 mg total) by mouth daily.   levothyroxine (SYNTHROID, LEVOTHROID) 50 MCG tablet Take 50 mcg by mouth daily before breakfast.   nitroGLYCERIN (NITROSTAT) 0.4 MG SL tablet Place 1 tablet under the tongue as needed.   nystatin (MYCOSTATIN/NYSTOP) powder Apply 1 Application topically 3 (three) times daily.   pantoprazole (PROTONIX) 40 MG tablet Take 1 tablet (40 mg total) by mouth daily.   polyethylene glycol (MIRALAX / GLYCOLAX) 17 g packet Take 17 g by mouth daily as needed.   POTASSIUM CHLORIDE ER PO Take 1 capsule by mouth every other day.   pravastatin (PRAVACHOL) 20 MG tablet Take 1 tablet by mouth at bedtime.   sodium fluoride (PREVIDENT 5000 PLUS) 1.1 % CREA dental cream Place 1 Application onto teeth every evening.   triamcinolone cream (KENALOG) 0.5 % Apply 1 Application topically 2 (two) times daily.    [DISCONTINUED] furosemide (LASIX) 20 MG tablet Take 20 mg by mouth every other day.   No facility-administered encounter medications on file as of 10/29/2022.    Review of Systems  Constitutional:  Negative for fatigue, fever and unexpected weight change.  HENT:  Positive for hearing loss and trouble swallowing. Negative for congestion.  Cough while eating.   Eyes:  Negative for visual disturbance.  Respiratory:  Negative for cough, chest tightness, shortness of breath and wheezing.   Cardiovascular:  Positive for leg swelling. Negative for chest pain and palpitations.  Gastrointestinal:  Negative for abdominal pain and constipation.  Genitourinary:  Positive for frequency. Negative for dysuria and urgency.  Musculoskeletal:  Positive for arthralgias and gait problem.  Skin:  Positive for rash.       Right buttock scar tissue from healed previous pressure ulcer. Thick scaly skin, erythematous swelling BLE-improved. Rash in skin folds-near resolution.   Neurological:  Negative for speech difficulty, weakness and headaches.  Psychiatric/Behavioral:  Negative for behavioral problems and sleep disturbance. The patient is not nervous/anxious.     Immunization History  Administered Date(s) Administered   Moderna Sars-Covid-2 Vaccination 07/06/2019, 08/02/2019, 05/06/2020, 11/25/2020   PPD Test 07/21/2015, 08/04/2015   Pfizer Covid-19 Vaccine Bivalent Booster 3yrs & up 04/29/2022   Tdap 07/16/2015   Unspecified SARS-COV-2 Vaccination 03/17/2021   Zoster Recombinat (Shingrix) 12/04/2013   Pertinent  Health Maintenance Due  Topic Date Due   INFLUENZA VACCINE  Discontinued      03/06/2020   11:12 AM 07/04/2020   11:15 AM 08/05/2020    2:48 PM 09/12/2020   10:19 AM 04/16/2022    2:46 PM  Fall Risk  Falls in the past year? 0 0 0 0 0  Was there an injury with Fall?   0  0  Fall Risk Category Calculator   0  0  Fall Risk Category (Retired)   Low  Low  (RETIRED) Patient Fall Risk  Level   Low fall risk  Low fall risk  Patient at Risk for Falls Due to     No Fall Risks  Fall risk Follow up     Falls evaluation completed   Functional Status Survey:    Vitals:   10/29/22 1119  BP: 128/86  Pulse: 62  Resp: 16  Temp: (!) 97.1 F (36.2 C)  SpO2: 96%  Weight: 137 lb (62.1 kg)  Height: 6\' 1"  (1.854 m)   Body mass index is 18.07 kg/m. Physical Exam Vitals and nursing note reviewed.  Constitutional:      Appearance: Normal appearance.  HENT:     Head: Normocephalic and atraumatic.     Mouth/Throat:     Mouth: Mucous membranes are moist.  Eyes:     Extraocular Movements: Extraocular movements intact.     Conjunctiva/sclera: Conjunctivae normal.     Pupils: Pupils are equal, round, and reactive to light.  Cardiovascular:     Rate and Rhythm: Normal rate. Rhythm irregular.     Heart sounds: No murmur heard. Pulmonary:     Effort: Pulmonary effort is normal.     Breath sounds: Rales present.     Comments: Bibasilar rales, L>R Abdominal:     Palpations: Abdomen is soft.     Tenderness: There is no abdominal tenderness.     Comments: Irregular schedule BM, but no constipation.   Musculoskeletal:     Cervical back: Normal range of motion and neck supple.     Right lower leg: Edema present.     Left lower leg: Edema present.     Comments: 1+ edema BLE  Skin:    General: Skin is warm and dry.     Findings: Erythema and rash present.     Comments: Chronic venous insufficiency skin changes. Thick scaly skin, erythematous swelling BLE, mild warmth and sensitive to  touch-improved.  Right buttock scar tissue from healed previous pressure ulcer. Lateral left lower leg skin lesion.   Near healed rash under the breasts skin folds, abd skin folds, in the groins folds, beefy redness, satellite pattern at the margin of reddened area.  Overall dry skin appearance.   Neurological:     General: No focal deficit present.     Mental Status: He is alert and oriented to  person, place, and time. Mental status is at baseline.     Gait: Gait abnormal.  Psychiatric:        Mood and Affect: Mood normal.        Behavior: Behavior normal.        Thought Content: Thought content normal.        Judgment: Judgment normal.     Labs reviewed: Recent Labs    06/15/22 0000  NA 139  K 4.5  CL 101  CO2 35*  BUN 38*  CREATININE 1.4*  CALCIUM 9.3   No results for input(s): "AST", "ALT", "ALKPHOS", "BILITOT", "PROT", "ALBUMIN" in the last 8760 hours. No results for input(s): "WBC", "NEUTROABS", "HGB", "HCT", "MCV", "PLT" in the last 8760 hours. Lab Results  Component Value Date   TSH 3.12 10/22/2021   No results found for: "HGBA1C" Lab Results  Component Value Date   CHOL 133 10/22/2021   HDL 43 10/22/2021   LDLCALC 76 10/22/2021   TRIG 59 10/22/2021   CHOLHDL 2.6 07/10/2019    Significant Diagnostic Results in last 30 days:  No results found.  Assessment/Plan Candidal skin infection   reported rash on right side. No rash noted on his right side upon my examination, the patient stated it was itching of the R back, but its resolved, no apparent rash seen, the patient declined any treatment.    Near resolution of rash under the breasts skin folds, in the groins folds, beefy redness, satellite pattern at the margin of reddened area.   Venous stasis dermatitis of both lower extremities   improved BLE erythema, thick scaly skin BLE, sensitive to touch, s/p Dermatology eval, Aveeno moisturizer  on Furosemide, chronic swelling.   Paroxysmal atrial fibrillation (HCC) on Furosemide, chronic swelling.   Acquired hypothyroidism stable, on Levothyroxine qd. TSH 3.12 10/22/21  BPH (benign prostatic hyperplasia)  stable, on Dutasteride 0.5mg  qd  CKD (chronic kidney disease) stage 3, GFR 30-59 ml/min (HCC) Bun/cret 38/1.37 06/15/22  Dysphagia declined ST or MBSS, cough associated with swallowing.   GERD (gastroesophageal reflux disease)  takes  Pantoprazole, Hgb 13.1 10/22/21  Hyperlipidemia LDL goal <70 takes Pravastatin, LDL 76 10/22/21  Angina pectoris (HCC) CAD/angina pectoris, takes Plavix, Isosorbide, prn NTG     Family/ staff Communication: plan of care reviewed with the patient and charge nurse.   Labs/tests ordered: none  Time spend 40 minutes.

## 2022-11-01 ENCOUNTER — Encounter: Payer: Self-pay | Admitting: Nurse Practitioner

## 2022-11-01 NOTE — Assessment & Plan Note (Signed)
stable, on Levothyroxine 50mcg qd. TSH 3.12 10/22/21 

## 2022-11-01 NOTE — Assessment & Plan Note (Signed)
CAD/angina pectoris, takes Plavix, Isosorbide, prn NTG 

## 2022-11-01 NOTE — Assessment & Plan Note (Signed)
improved BLE erythema, thick scaly skin BLE, sensitive to touch, s/p Dermatology eval, Aveeno moisturizer  on Furosemide, chronic swelling.

## 2022-11-01 NOTE — Assessment & Plan Note (Signed)
declined ST or MBSS, cough associated with swallowing.  

## 2022-11-01 NOTE — Assessment & Plan Note (Signed)
on Furosemide, chronic swelling.  

## 2022-11-01 NOTE — Assessment & Plan Note (Signed)
reported rash on right side. No rash noted on his right side upon my examination, the patient stated it was itching of the R back, but its resolved, no apparent rash seen, the patient declined any treatment.    Near resolution of rash under the breasts skin folds, in the groins folds, beefy redness, satellite pattern at the margin of reddened area.

## 2022-11-01 NOTE — Assessment & Plan Note (Signed)
takes Pravastatin, LDL 76 10/22/21 

## 2022-11-01 NOTE — Assessment & Plan Note (Signed)
stable, on Dutasteride 0.5mg qd 

## 2022-11-01 NOTE — Assessment & Plan Note (Signed)
takes Pantoprazole, Hgb 13.1 10/22/21 

## 2022-11-01 NOTE — Assessment & Plan Note (Signed)
Bun/cret 38/1.37 06/15/22 

## 2022-12-14 ENCOUNTER — Encounter: Payer: Self-pay | Admitting: Nurse Practitioner

## 2022-12-14 ENCOUNTER — Non-Acute Institutional Stay (INDEPENDENT_AMBULATORY_CARE_PROVIDER_SITE_OTHER): Payer: Medicare Other | Admitting: Nurse Practitioner

## 2022-12-14 DIAGNOSIS — R413 Other amnesia: Secondary | ICD-10-CM | POA: Diagnosis not present

## 2022-12-14 DIAGNOSIS — Z0000 Encounter for general adult medical examination without abnormal findings: Secondary | ICD-10-CM

## 2022-12-15 ENCOUNTER — Encounter: Payer: Self-pay | Admitting: Nurse Practitioner

## 2022-12-15 NOTE — Progress Notes (Unsigned)
Subjective:   Cody [name] is a 89 y.o. male who presents for Medicare Annual/Subsequent preventive examination.  Visit Complete: In person  I have confirmed that all information answered by patient is correct and no changes since this date.  Cardiac Risk Factors include: advanced age (>60men, >25 women);dyslipidemia;hypertension;male gender     Objective:    Today's Vitals   12/14/22 1303 12/15/22 1353  BP: (!) 109/56   Pulse: 82   Resp: 20   Temp: 97.6 F (36.4 C)   SpO2: 95%   Weight: 140 lb (63.5 kg)   Height: 6\' 1"  (1.854 m)   PainSc:  2    Body mass index is 18.47 kg/m.     12/14/2022    1:04 PM 10/29/2022   11:27 AM 04/16/2022    2:49 PM 04/16/2022    2:47 PM 01/25/2022    3:07 PM 01/05/2022    2:26 PM 11/11/2021   12:18 PM  Advanced Directives  Does Patient Have a Medical Advance Directive? Yes Yes Yes Yes Yes Yes Yes  Type of Estate agent of [place];Out of facility DNR (pink MOST or yellow form) Healthcare Power of [place];Out of facility DNR (pink MOST or yellow form) Healthcare Power of [place];Out of facility DNR (pink MOST or yellow form) Healthcare Power of [place];Out of facility DNR (pink MOST or yellow form) Healthcare Power of [place];Out of facility DNR (pink MOST or yellow form) Healthcare Power of [place];Out of facility DNR (pink MOST or yellow form) Healthcare Power of [place];Out of facility DNR (pink MOST or yellow form)  Does patient want to make changes to medical advance directive? No - Patient declined No - Patient declined No - Patient declined No - Patient declined No - Patient declined No - Patient declined No - Patient declined  Copy of Healthcare Power of Attorney in Chart? Yes - validated most recent copy scanned in chart (See row information) Yes - validated most recent copy scanned in chart (See row information) Yes - validated most recent copy scanned in chart (See row information) Yes - validated most recent  copy scanned in chart (See row information) Yes - validated most recent copy scanned in chart (See row information) Yes - validated most recent copy scanned in chart (See row information) Yes - validated most recent copy scanned in chart (See row information)  Pre-existing out of facility DNR order (yellow form or pink MOST form) Yellow form placed in chart (order not valid for inpatient use) Yellow form placed in chart (order not valid for inpatient use)   Yellow form placed in chart (order not valid for inpatient use) Yellow form placed in chart (order not valid for inpatient use) Yellow form placed in chart (order not valid for inpatient use)    Current Medications (verified) Outpatient Encounter Medications as of 12/14/2022  Medication Sig   ammonium lactate (LAC-HYDRIN) 12 % lotion Apply 1 application. topically at bedtime.   cholecalciferol (VITAMIN D) 400 units TABS tablet Take 400 Units by mouth 2 (two) times daily.   clopidogrel (PLAVIX) 75 MG tablet Take 75 mg by mouth daily. Start PLAVIX from 07/24/15 if no further hematuria is reported by patient and HB > 12.   digoxin (LANOXIN) 0.125 MG tablet Take 0.125 mg by mouth daily.   dutasteride (AVODART) 0.5 MG capsule Take 0.5 mg by mouth daily.    furosemide (LASIX) 40 MG tablet Take 40 mg by mouth daily.   isosorbide mononitrate (IMDUR) 30 MG 24 hr tablet  Take 0.5 tablets (15 mg total) by mouth daily.   levothyroxine (SYNTHROID, LEVOTHROID) 50 MCG tablet Take 50 mcg by mouth daily before breakfast.   nitroGLYCERIN (NITROSTAT) 0.4 MG SL tablet Place 1 tablet under the tongue as needed.   nystatin (MYCOSTATIN/NYSTOP) powder Apply 1 Application topically 3 (three) times daily.   pantoprazole (PROTONIX) 40 MG tablet Take 1 tablet (40 mg total) by mouth daily.   polyethylene glycol (MIRALAX / GLYCOLAX) 17 g packet Take 17 g by mouth daily as needed.   POTASSIUM CHLORIDE ER PO Take 1 capsule by mouth every other day.   pravastatin (PRAVACHOL) 20  MG tablet Take 1 tablet by mouth at bedtime.   sodium fluoride (PREVIDENT 5000 PLUS) 1.1 % CREA dental cream Place 1 Application onto teeth every evening.   triamcinolone cream (KENALOG) 0.5 % Apply 1 Application topically 2 (two) times daily.   No facility-administered encounter medications on file as of 12/14/2022.    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   Cervical spondylosis without myelopathy 08/22/2015   Diverticulosis    Diverticulosis of colon without hemorrhage 08/22/2015   Edema 08/18/2015   Fall 07/17/2015   Gait disturbance 08/22/2015   GERD (gastroesophageal reflux disease)    occurs rarely   History of CVA (cerebrovascular accident) 07/17/2015   HLD (hyperlipidemia) 07/17/2015   Hyperlipidemia    Hypertension    Inguinal hernia    Left carotid bruit    Melanoma (HCC)    left forearm   Osteopenia    Paroxysmal atrial fibrillation (HCC)    Physical deconditioning 08/22/2015   Retinal hemorrhage 04/24/2014   Rosacea blepharoconjunctivitis 11/20/2011   Stroke (HCC) 3/07   affected balance   Tubular adenoma of colon 2007   Weakness 07/17/2015   Past Surgical History:  Procedure Laterality Date   COLONOSCOPY  2008   EYE SURGERY Right    cataract extraction with IOL   INGUINAL HERNIA REPAIR Right 03/15/2013   Procedure: HERNIA REPAIR INGUINAL ADULT;  Surgeon: Adolph Pollack, MD;  Location: [facility];  Service: General;  Laterality: Right;   INSERTION OF MESH Right 03/15/2013   Procedure: INSERTION OF MESH;  Surgeon: Adolph Pollack, MD;  Location: [facility];  Service: General;  Laterality: Right;   left forearm melanoma     PROSTATE BIOPSY     SKIN SURGERY  08/04/2020    TONSILLECTOMY  1933   Family History  Problem Relation Age of Onset   Cancer Mother        lung and breast   Heart disease Father        MI   Cancer Sister        pancreatic   Social History   Socioeconomic History   Marital status: Divorced    Spouse name: Not on file   Number of children: Not on file   Years of education: Not on file   Highest education level: Not on file  Occupational History   Occupation: Retired Airline pilot    Comment: Sales  Tobacco Use   Smoking status: Former    Types: Cigarettes    Quit date: 11/05/1972    Years since quitting: 50.1   Smokeless  tobacco: Never  Vaping Use   Vaping Use: Never used  Substance and Sexual Activity   Alcohol use: No    Comment:  3 ounces wine nightly   Drug use: No   Sexual activity: Not on file  Other Topics Concern   Not on file  Social History Narrative   Diet? Regular/normal      Do you drink/eat things with caffeine? no      Marital status?      divorced                              What year were you married? 1957      Do you live in a house, apartment, assisted living, condo, trailer, etc.? Apartment. Moved to [place] 08/21/2015      Is it one or more stories? 2      How many persons live in your home? Live alone      Do you have any pets in your home? (please list) no      Current or past profession: Sales      Do you exercise?      "I do now."                            Type & how often? Exercise bike, 3 times weekly      Do you have a living will? no      Do you have a DNR form?    yes                              If not, do you want to discuss one?      Do you have signed POA/HPOA for forms?                Social Determinants of Health   Financial Resource Strain: Not on file  Food Insecurity: Not on file  Transportation Needs: Not on file  Physical Activity: Not on file  Stress: Not on file  Social Connections: Not on file    Tobacco Counseling Counseling given: Not Answered   Clinical  Intake:  Pre-visit preparation completed: Yes  Pain : 0-10 Pain Score: 2  Pain Type: Chronic pain Pain Location: Buttocks Pain Orientation: Right Pain Descriptors / Indicators: Aching Pain Onset: More than a month ago Pain Frequency: Several days a week Pain Relieving Factors: pressure reduction by frequent repositioning Effect of Pain on Daily Activities: none  Pain Relieving Factors: pressure reduction by frequent repositioning  BMI - recorded: 18.47 Nutritional Status: BMI <19  Underweight Nutritional Risks: None Diabetes: No  How often do you need to have someone help you when you read instructions, pamphlets, or other written materials from your doctor or pharmacy?: 3 - Sometimes  Interpreter Needed?: No  Information entered by :: Dicky Boer Nedra Hai NP   Activities of Daily Living    12/15/2022    1:57 PM  In your present state of health, do you have any difficulty performing the following activities:  Hearing? 1  Vision? 0  Difficulty concentrating or making decisions? 0  Dressing or bathing? 1  Doing errands, shopping? 1  Preparing Food and eating ? N  Using the Toilet? N  In the past six months, have you accidently leaked urine? Y  Do you have problems with loss of bowel  control? N  Managing your Medications? Y  Managing your Finances? Y  Housekeeping or managing your Housekeeping? Y    Patient Care Team: Mahlon Gammon, MD as PCP - General (Internal Medicine) Arminda Resides, MD as Consulting Physician (Dermatology) Beronica Lansdale X, NP as Nurse Practitioner (Internal Medicine)  Indicate any recent Medical Services you may have received from other than [facility] providers in the past year (date may be approximate).     Assessment:   This is a routine wellness examination for Cody [name].  Hearing/Vision screen No results found.  Dietary issues and exercise activities discussed:     Goals Addressed             This Visit's Progress    Maintain Mobility and  Function       Evidence-based guidance:  Acknowledge and validate impact of pain, loss of strength and potential disfigurement (hand osteoarthritis) on mental health and daily life, such as social isolation, anxiety, depression, impaired sexual relationship and   injury from falls.  Anticipate referral to physical or occupational therapy for assessment, therapeutic exercise and recommendation for adaptive equipment or assistive devices; encourage participation.  Assess impact on ability to perform activities of daily living, as well as engage in sports and leisure events or requirements of work or school.  Provide anticipatory guidance and reassurance about the benefit of exercise to maintain function; acknowledge and normalize fear that exercise may worsen symptoms.  Encourage regular exercise, at least 10 minutes at a time for 45 minutes per week; consider yoga, water exercise and proprioceptive exercises; encourage use of wearable activity tracker to increase motivation and adherence.  Encourage maintenance or resumption of daily activities, including employment, as pain allows and with minimal exposure to trauma.  Assist patient to advocate for adaptations to the work environment.  Consider level of pain and function, gender, age, lifestyle, patient preference, quality of life, readiness and ?ocapacity to benefit? when recommending patients for orthopaedic surgery consultation.  Explore strategies, such as changes to medication regimen or activity that enables patient to anticipate and manage flare-ups that increase deconditioning and disability.  Explore patient preferences; encourage exposure to a broader range of activities that have been avoided for fear of experiencing pain.  Identify barriers to participation in therapy or exercise, such as pain with activity, anticipated or imagined pain.  Monitor postoperative joint replacement or any preexisting joint replacement for ongoing pain and  loss of function; provide social support and encouragement throughout recovery.   Notes:        Depression Screen    12/14/2022    1:05 PM 04/16/2022    2:47 PM 08/05/2020    2:45 PM 09/07/2018    1:09 PM 06/14/2017    9:20 AM 02/26/2016    2:18 PM 11/06/2015    3:27 PM  PHQ 2/9 Scores  PHQ - 2 Score 0 0 0 0 0 0 0  Exception Documentation Other- indicate reason in comment box        Not completed awv          Fall Risk    12/14/2022    1:05 PM 04/16/2022    2:46 PM 09/12/2020   10:19 AM 08/05/2020    2:48 PM 07/04/2020   11:15 AM  Fall Risk   Falls in the past year? 0 0 0 0 0  Number falls in past yr: 0 0 0 0 0  Injury with Fall? 0 0  0   Risk for fall due to :  No Fall Risks No Fall Risks     Follow up Falls evaluation completed Falls evaluation completed       MEDICARE RISK AT HOME:   TIMED UP AND GO:  Was the test performed?  Yes  Length of time to ambulate 10 feet: 10 sec Gait slow and steady with assistive device    Cognitive Function:    03/06/2020    3:50 PM 02/22/2019    1:12 PM 03/22/2017   11:31 AM  MMSE - Mini Mental State Exam  Orientation to time 4 5 5   Orientation to Place 5 5 5   Registration 3 3 3   Attention/ Calculation 3 4 5   Recall 3 3 3   Language- name 2 objects 2 2 2   Language- repeat 1 1 1   Language- follow 3 step command 3 1 3   Language- read & follow direction 1 1 1   Write a sentence 1 1 1   Copy design 0 1 1  Total score 26 27 30         08/05/2020    2:49 PM  6CIT Screen  What Year? 0 points  What month? 0 points  What time? 0 points  Count back from 20 0 points  Months in reverse 0 points  Repeat phrase 10 points  Total Score 10 points    Immunizations Immunization History  Administered Date(s) Administered   Moderna Sars-Covid-2 Vaccination 07/06/2019, 08/02/2019, 05/06/2020, 11/25/2020   PPD Test 07/21/2015, 08/04/2015   Pfizer Covid-19 Vaccine Bivalent Booster 27yrs & up 04/29/2022   Tdap 07/16/2015   Unspecified  SARS-COV-2 Vaccination 03/17/2021   Zoster Recombinat (Shingrix) 12/04/2013    TDAP status: Up to date  Flu Vaccine status: Up to date  Pneumococcal vaccine status: Due, Education has been provided regarding the importance of this vaccine. Advised may receive this vaccine at local pharmacy or Health Dept. Aware to provide a copy of the vaccination record if obtained from local pharmacy or Health Dept. Verbalized acceptance and understanding.  Covid-19 vaccine status: Information provided on how to obtain vaccines.   Qualifies for Shingles Vaccine? Yes   Zostavax completed Yes   Shingrix Completed?: No.    Education has been provided regarding the importance of this vaccine. Patient has been advised to call insurance company to determine out of pocket expense if they have not yet received this vaccine. Advised may also receive vaccine at local pharmacy or Health Dept. Verbalized acceptance and understanding.  Screening Tests Health Maintenance  Topic Date Due   Pneumonia Vaccine 56+ Years old (1 of 1 - PCV) Never done   Zoster Vaccines- Shingrix (2 of 2) 01/29/2014   COVID-19 Vaccine (7 - 2023-24 season) 06/24/2022   Medicare Annual Wellness (AWV)  12/14/2023   DTaP/Tdap/Td (2 - Td or Tdap) 07/15/2025   HPV VACCINES  Aged Out   INFLUENZA VACCINE  Discontinued    Health Maintenance  Health Maintenance Due  Topic Date Due   Pneumonia Vaccine 29+ Years old (1 of 1 - PCV) Never done   Zoster Vaccines- Shingrix (2 of 2) 01/29/2014   COVID-19 Vaccine (7 - 2023-24 season) 06/24/2022    Colorectal cancer screening: No longer required.   Lung Cancer Screening: (Low Dose CT Chest recommended if Age 42-80 years, 20 pack-year currently smoking OR have quit w/in 15years.) does not qualify.    Additional Screening:  Hepatitis C Screening: does not qualify  Vision Screening: Recommended annual ophthalmology exams for early detection of glaucoma and other disorders of the eye. Is  the  patient up to date with their annual eye exam?  No  Who is the provider or what is the name of the office in which the patient attends annual eye exams? HPOA will provide If pt is not established with a provider, would they like to be referred to a provider to establish care? No .   Dental Screening: Recommended annual dental exams for proper oral hygiene    Community Resource Referral / Chronic Care Management: CRR required this visit?  No   CCM required this visit?  No     Plan:     I have personally reviewed and noted the following in the patient's chart:   Medical and social history Use of alcohol, tobacco or illicit drugs  Current medications and supplements including opioid prescriptions. Patient is not currently taking opioid prescriptions. Functional ability and status Nutritional status Physical activity Advanced directives List of other physicians Hospitalizations, surgeries, and ER visits in previous 12 months Vitals Screenings to include cognitive, depression, and falls Referrals and appointments  In addition, I have reviewed and discussed with patient certain preventive protocols, quality metrics, and best practice recommendations. A written personalized care plan for preventive services as well as general preventive health recommendations were provided to patient.     Addalie Calles X Pamala Hayman, NP   12/15/2022   After Visit Summary: in person

## 2022-12-23 DIAGNOSIS — I10 Essential (primary) hypertension: Secondary | ICD-10-CM | POA: Diagnosis not present

## 2022-12-23 DIAGNOSIS — R609 Edema, unspecified: Secondary | ICD-10-CM | POA: Diagnosis not present

## 2022-12-23 DIAGNOSIS — E039 Hypothyroidism, unspecified: Secondary | ICD-10-CM | POA: Diagnosis not present

## 2022-12-23 DIAGNOSIS — I480 Paroxysmal atrial fibrillation: Secondary | ICD-10-CM | POA: Diagnosis not present

## 2022-12-24 LAB — COMPREHENSIVE METABOLIC PANEL
Albumin: 3.7 (ref 3.5–5.0)
Calcium: 9.3 (ref 8.7–10.7)
Globulin: 2.9
eGFR: 57

## 2022-12-24 LAB — HEPATIC FUNCTION PANEL
ALT: 7 U/L — AB (ref 10–40)
AST: 13 — AB (ref 14–40)
Alkaline Phosphatase: 76 (ref 25–125)
Bilirubin, Total: 0.7

## 2022-12-24 LAB — HEMOGLOBIN A1C

## 2022-12-24 LAB — LIPID PANEL
Cholesterol: 147 (ref 0–200)
HDL: 42 (ref 35–70)
LDL Cholesterol: 85
LDl/HDL Ratio: 3.5
Triglycerides: 100 (ref 40–160)

## 2022-12-24 LAB — BASIC METABOLIC PANEL
BUN: 30 — AB (ref 4–21)
CO2: 34 — AB (ref 13–22)
Chloride: 100 (ref 99–108)
Creatinine: 1.2 (ref 0.6–1.3)
Glucose: 102
Potassium: 4.1 mEq/L (ref 3.5–5.1)
Sodium: 141 (ref 137–147)

## 2022-12-24 LAB — CBC AND DIFFERENTIAL
HCT: 41 (ref 41–53)
Hemoglobin: 13.5 (ref 13.5–17.5)
Neutrophils Absolute: 2985
Platelets: 250 10*3/uL (ref 150–400)
WBC: 5.9

## 2022-12-24 LAB — TSH: TSH: 3.96 (ref 0.41–5.90)

## 2022-12-24 LAB — CBC: RBC: 4.43 (ref 3.87–5.11)

## 2023-01-04 ENCOUNTER — Non-Acute Institutional Stay: Payer: Medicare Other | Admitting: Nurse Practitioner

## 2023-01-04 ENCOUNTER — Encounter: Payer: Self-pay | Admitting: Nurse Practitioner

## 2023-01-04 DIAGNOSIS — K219 Gastro-esophageal reflux disease without esophagitis: Secondary | ICD-10-CM

## 2023-01-04 DIAGNOSIS — I480 Paroxysmal atrial fibrillation: Secondary | ICD-10-CM | POA: Diagnosis not present

## 2023-01-04 DIAGNOSIS — E785 Hyperlipidemia, unspecified: Secondary | ICD-10-CM

## 2023-01-04 DIAGNOSIS — I872 Venous insufficiency (chronic) (peripheral): Secondary | ICD-10-CM | POA: Diagnosis not present

## 2023-01-04 DIAGNOSIS — R1312 Dysphagia, oropharyngeal phase: Secondary | ICD-10-CM | POA: Diagnosis not present

## 2023-01-04 DIAGNOSIS — N1831 Chronic kidney disease, stage 3a: Secondary | ICD-10-CM

## 2023-01-04 DIAGNOSIS — E039 Hypothyroidism, unspecified: Secondary | ICD-10-CM | POA: Diagnosis not present

## 2023-01-04 DIAGNOSIS — N400 Enlarged prostate without lower urinary tract symptoms: Secondary | ICD-10-CM

## 2023-01-04 DIAGNOSIS — I209 Angina pectoris, unspecified: Secondary | ICD-10-CM

## 2023-01-04 NOTE — Assessment & Plan Note (Signed)
takes Pravastatin, LDL 85 12/24/22

## 2023-01-04 NOTE — Assessment & Plan Note (Signed)
Bun/cret 30/1.2 12/24/22

## 2023-01-04 NOTE — Assessment & Plan Note (Signed)
Stable,  takes Plavix, Isosorbide, prn NTG

## 2023-01-04 NOTE — Assessment & Plan Note (Signed)
takes Pantoprazole, Hgb 13.5 12/24/22

## 2023-01-04 NOTE — Progress Notes (Addendum)
Location:  [facility] Room Number: ALF/813/A Place of Service:  ALF (13) Provider:  Maygan Koeller X, NP    Patient Care Team: Mahlon Gammon, MD as PCP - General (Internal Medicine) Arminda Resides, MD as Consulting Physician (Dermatology) Kaitrin Seybold X, NP as Nurse Practitioner (Internal Medicine)  Extended Emergency Contact Information Primary Emergency Contact: Doby,Bill Address: [address]          [place], [place] [address] [place] of [place] Home Phone: [phone] Mobile Phone: [phone] Relation: Brother Secondary Emergency Contact: Isla Pence [place] of [place] Mobile Phone: [phone] Relation: Relative  Code Status:  DNR Goals of care: Advanced Directive information    01/04/2023   10:50 AM  Advanced Directives  Does Patient Have a Medical Advance Directive? Yes  Type of Estate agent of [place];Out of facility DNR (pink MOST or yellow form)  Does patient want to make changes to medical advance directive? No - Patient declined  Copy of Healthcare Power of Attorney in Chart? Yes - validated most recent copy scanned in chart (See row information)  Pre-existing out of facility DNR order (yellow form or pink MOST form) Yellow form placed in chart (order not valid for inpatient use)     Chief Complaint  Patient presents with   Medical Management of Chronic Issues    Routine Visit   Quality Metric Gaps    Needs to discuss Shingrix and Covid vaccine.    HPI:  Pt is a 89 y.o. male seen today for medical management of chronic diseases.               Venous dermatitis, improved BLE erythema, thick scaly skin BLE, sensitive to touch, s/p Dermatology eval, Aveeno moisturizer              Edema BLE, on Furosemide, chronic swelling.              The right buttock pressure ulcer  is healed with a scar tissue, short lived pain in the area come and goes, positional             Hx of Afib, heart rate is in  control, on Digoxin. Dig level 1.2 06/29/22 , takes Plavix             Hypothyroidism, stable, on Levothyroxine qd. TSH 3.96 12/24/22             Urinary frequency, stable, on Dutasteride 0.5mg  qd             CKD Bun/cret 30/1.2 12/24/22             Dysphagia, declined ST or MBSS, cough associated with swallowing.              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   Cervical spondylosis without myelopathy 08/22/2015   Diverticulosis    Diverticulosis of colon without hemorrhage 08/22/2015   Edema 08/18/2015   Fall 07/17/2015  Gait disturbance 08/22/2015   GERD (gastroesophageal reflux disease)    occurs rarely   History of CVA (cerebrovascular accident) 07/17/2015   HLD (hyperlipidemia) 07/17/2015   Hyperlipidemia    Hypertension    Inguinal hernia    Left carotid bruit    Melanoma (HCC)    left forearm   Osteopenia    Paroxysmal atrial fibrillation (HCC)    Physical deconditioning 08/22/2015   Retinal hemorrhage 04/24/2014   Rosacea blepharoconjunctivitis 11/20/2011   Stroke (HCC) 3/07   affected balance   Tubular adenoma of colon 2007   Weakness 07/17/2015   Past Surgical History:  Procedure Laterality Date   COLONOSCOPY  2008   EYE SURGERY Right    cataract extraction with IOL   INGUINAL HERNIA REPAIR Right 03/15/2013   Procedure: HERNIA REPAIR INGUINAL ADULT;  Surgeon: Adolph Pollack, MD;  Location: [facility];  Service: General;  Laterality: Right;   INSERTION OF MESH Right 03/15/2013   Procedure: INSERTION OF MESH;  Surgeon: Adolph Pollack, MD;  Location: [facility];  Service:  General;  Laterality: Right;   left forearm melanoma     PROSTATE BIOPSY     SKIN SURGERY  08/04/2020   TONSILLECTOMY  1933    Allergies  Allergen Reactions   Aspirin    Egg-Derived Products Swelling    throat    Outpatient Encounter Medications as of 01/04/2023  Medication Sig   ammonium lactate (LAC-HYDRIN) 12 % lotion Apply 1 application. topically at bedtime.   cholecalciferol (VITAMIN D) 400 units TABS tablet Take 400 Units by mouth 2 (two) times daily.   clopidogrel (PLAVIX) 75 MG tablet Take 75 mg by mouth daily. Start PLAVIX from 07/24/15 if no further hematuria is reported by patient and HB > 12.   digoxin (LANOXIN) 0.125 MG tablet Take 0.125 mg by mouth daily.   dutasteride (AVODART) 0.5 MG capsule Take 0.5 mg by mouth daily.    furosemide (LASIX) 40 MG tablet Take 40 mg by mouth daily.   isosorbide mononitrate (IMDUR) 30 MG 24 hr tablet Take 0.5 tablets (15 mg total) by mouth daily.   levothyroxine (SYNTHROID, LEVOTHROID) 50 MCG tablet Take 50 mcg by mouth daily before breakfast.   nitroGLYCERIN (NITROSTAT) 0.4 MG SL tablet Place 1 tablet under the tongue as needed.   nystatin (MYCOSTATIN/NYSTOP) powder Apply 1 Application topically 3 (three) times daily.   pantoprazole (PROTONIX) 40 MG tablet Take 1 tablet (40 mg total) by mouth daily.   polyethylene glycol (MIRALAX / GLYCOLAX) 17 g packet Take 17 g by mouth daily as needed.   POTASSIUM CHLORIDE ER PO Take 1 capsule by mouth every other day.   pravastatin (PRAVACHOL) 20 MG tablet Take 1 tablet by mouth at bedtime.   sodium fluoride (PREVIDENT 5000 PLUS) 1.1 % CREA dental cream Place 1 Application onto teeth every evening.   [DISCONTINUED] triamcinolone cream (KENALOG) 0.5 % Apply 1 Application topically 2 (two) times daily.   No facility-administered encounter medications on file as of 01/04/2023.    Review of Systems  Constitutional:  Negative for fatigue, fever and unexpected weight change.  HENT:  Positive for hearing  loss and trouble swallowing. Negative for congestion.        Cough while eating.   Eyes:  Negative for visual disturbance.  Respiratory:  Negative for cough, chest tightness, shortness of breath and wheezing.   Cardiovascular:  Positive for leg swelling. Negative for chest pain and palpitations.  Gastrointestinal:  Negative for abdominal pain and  constipation.  Genitourinary:  Positive for frequency. Negative for dysuria and urgency.  Musculoskeletal:  Positive for arthralgias and gait problem.  Skin:  Negative for color change.       Right buttock scar tissue from healed previous pressure ulcer. Thick scaly skin, erythematous swelling BLE-improved. Rash in skin folds-near resolution.   Neurological:  Negative for speech difficulty, weakness and headaches.  Psychiatric/Behavioral:  Negative for behavioral problems and sleep disturbance. The patient is not nervous/anxious.     Immunization History  Administered Date(s) Administered   Moderna Covid-19 Vaccine Bivalent Booster 52yrs & up 11/11/2021   Moderna Sars-Covid-2 Vaccination 07/06/2019, 08/02/2019, 05/06/2020, 11/25/2020   PNEUMOCOCCAL CONJUGATE-20 12/20/2022   PPD Test 07/21/2015, 08/04/2015   Pfizer Covid-19 Vaccine Bivalent Booster 19yrs & up 04/29/2022   Tdap 07/16/2015   Unspecified SARS-COV-2 Vaccination 03/17/2021   Zoster Recombinant(Shingrix) 12/04/2013   Pertinent  Health Maintenance Due  Topic Date Due   INFLUENZA VACCINE  Discontinued      07/04/2020   11:15 AM 08/05/2020    2:48 PM 09/12/2020   10:19 AM 04/16/2022    2:46 PM 12/14/2022    1:05 PM  Fall Risk  Falls in the past year? 0 0 0 0 0  Was there an injury with Fall?  0  0 0  Fall Risk Category Calculator  0  0 0  Fall Risk Category (Retired)  Low  Low   (RETIRED) Patient Fall Risk Level  Low fall risk  Low fall risk   Patient at Risk for Falls Due to    No Fall Risks No Fall Risks  Fall risk Follow up    Falls evaluation completed Falls evaluation  completed   Functional Status Survey:    Vitals:   01/04/23 1045  BP: (!) 121/52  Pulse: 77  Resp: 18  Temp: 98.5 F (36.9 C)  SpO2: 93%  Weight: 141 lb 6.4 oz (64.1 kg)  Height: 6\' 1"  (1.854 m)   Body mass index is 18.66 kg/m. Physical Exam Vitals and nursing note reviewed.  Constitutional:      Appearance: Normal appearance.  HENT:     Head: Normocephalic and atraumatic.     Mouth/Throat:     Mouth: Mucous membranes are moist.  Eyes:     Extraocular Movements: Extraocular movements intact.     Conjunctiva/sclera: Conjunctivae normal.     Pupils: Pupils are equal, round, and reactive to light.  Cardiovascular:     Rate and Rhythm: Normal rate. Rhythm irregular.     Heart sounds: No murmur heard. Pulmonary:     Effort: Pulmonary effort is normal.     Breath sounds: Rales present.     Comments: Bibasilar rales, L>R Abdominal:     Palpations: Abdomen is soft.     Tenderness: There is no abdominal tenderness.     Comments: Irregular schedule BM, but no constipation.   Musculoskeletal:     Cervical back: Normal range of motion and neck supple.     Right lower leg: Edema present.     Left lower leg: Edema present.     Comments: 1+ edema BLE  Skin:    General: Skin is warm and dry.     Findings: Erythema present.     Comments: Chronic venous insufficiency skin changes. Thick scaly skin, erythematous swelling BLE, mild warmth and sensitive to touch-improved.  Right buttock scar tissue from healed previous pressure ulcer. Lateral left lower leg skin lesion.   Overall dry skin appearance.  Neurological:     General: No focal deficit present.     Mental Status: He is alert and oriented to person, place, and time. Mental status is at baseline.     Gait: Gait abnormal.  Psychiatric:        Mood and Affect: Mood normal.        Behavior: Behavior normal.        Thought Content: Thought content normal.        Judgment: Judgment normal.     Labs reviewed: Recent Labs     06/15/22 0000 12/24/22 0000  NA 139 141  K 4.5 4.1  CL 101 100  CO2 35* 34*  BUN 38* 30*  CREATININE 1.4* 1.2  CALCIUM 9.3 9.3   Recent Labs    12/24/22 0000  AST 13*  ALT 7*  ALKPHOS 76  ALBUMIN 3.7   Recent Labs    12/24/22 0000  WBC 5.9  NEUTROABS 2,985.00  HGB 13.5  HCT 41  PLT 250   Lab Results  Component Value Date   TSH 3.96 12/24/2022   No results found for: "HGBA1C" Lab Results  Component Value Date   CHOL 147 12/24/2022   HDL 42 12/24/2022   LDLCALC 85 12/24/2022   TRIG 100 12/24/2022   CHOLHDL 2.6 07/10/2019    Significant Diagnostic Results in last 30 days:  No results found.  Assessment/Plan Paroxysmal atrial fibrillation (HCC) heart rate is in control, on Digoxin. Dig level 1.2 06/29/22 , takes Plavix  Acquired hypothyroidism stable, on Levothyroxine qd. TSH 3.96 12/24/22  BPH (benign prostatic hyperplasia) stable, on Dutasteride 0.5mg  qd  CKD (chronic kidney disease) stage 3, GFR 30-59 ml/min (HCC)  Bun/cret 30/1.2 12/24/22  Dysphagia  declined ST or MBSS, cough associated with swallowing.   GERD (gastroesophageal reflux disease) takes Pantoprazole, Hgb 13.5 12/24/22  Hyperlipidemia LDL goal <70 takes Pravastatin, LDL 85 12/24/22  Angina pectoris (HCC) Stable,  takes Plavix, Isosorbide, prn NTG  Venous stasis dermatitis of both lower extremities  Venous dermatitis, improved BLE erythema, thick scaly skin BLE, sensitive to touch, s/p Dermatology eval, Aveeno moisturizer              Edema BLE, on Furosemide, chronic swelling.      Family/ staff Communication: plan of care reviewed with the patient and charge nurse.   Labs/tests ordered:  none  Time spend 40 minutes.

## 2023-01-04 NOTE — Assessment & Plan Note (Signed)
declined ST or MBSS, cough associated with swallowing.  

## 2023-01-04 NOTE — Assessment & Plan Note (Signed)
heart rate is in control, on Digoxin. Dig level 1.2 06/29/22 , takes Plavix

## 2023-01-04 NOTE — Assessment & Plan Note (Signed)
stable, on Dutasteride 0.5mg qd 

## 2023-01-04 NOTE — Assessment & Plan Note (Signed)
Venous dermatitis, improved BLE erythema, thick scaly skin BLE, sensitive to touch, s/p Dermatology eval, Aveeno moisturizer              Edema BLE, on Furosemide, chronic swelling.

## 2023-01-04 NOTE — Assessment & Plan Note (Signed)
stable, on Levothyroxine qd. TSH 3.96 12/24/22

## 2023-01-11 DIAGNOSIS — L602 Onychogryphosis: Secondary | ICD-10-CM | POA: Diagnosis not present

## 2023-01-11 DIAGNOSIS — M2041 Other hammer toe(s) (acquired), right foot: Secondary | ICD-10-CM | POA: Diagnosis not present

## 2023-01-11 DIAGNOSIS — M2042 Other hammer toe(s) (acquired), left foot: Secondary | ICD-10-CM | POA: Diagnosis not present

## 2023-02-04 ENCOUNTER — Encounter: Payer: Self-pay | Admitting: Family Medicine

## 2023-02-07 ENCOUNTER — Encounter: Payer: Self-pay | Admitting: Family Medicine

## 2023-04-22 ENCOUNTER — Encounter: Payer: Self-pay | Admitting: Sports Medicine

## 2023-04-22 ENCOUNTER — Non-Acute Institutional Stay: Payer: Medicare Other | Admitting: Sports Medicine

## 2023-04-22 DIAGNOSIS — I480 Paroxysmal atrial fibrillation: Secondary | ICD-10-CM

## 2023-04-22 DIAGNOSIS — K219 Gastro-esophageal reflux disease without esophagitis: Secondary | ICD-10-CM | POA: Diagnosis not present

## 2023-04-22 DIAGNOSIS — E039 Hypothyroidism, unspecified: Secondary | ICD-10-CM | POA: Diagnosis not present

## 2023-04-22 DIAGNOSIS — N400 Enlarged prostate without lower urinary tract symptoms: Secondary | ICD-10-CM | POA: Diagnosis not present

## 2023-04-22 DIAGNOSIS — I2510 Atherosclerotic heart disease of native coronary artery without angina pectoris: Secondary | ICD-10-CM | POA: Diagnosis not present

## 2023-04-22 DIAGNOSIS — I872 Venous insufficiency (chronic) (peripheral): Secondary | ICD-10-CM

## 2023-04-22 NOTE — Addendum Note (Signed)
Addended by: Venita Sheffield on: 04/22/2023 02:01 PM   Modules accepted: Level of Service

## 2023-04-22 NOTE — Progress Notes (Addendum)
Provider:   Venita Sheffield MD Location:   [facility]   Place of Service:   Assisted living room 813   PCP: Venita Sheffield, MD Patient Care Team: Venita Sheffield, MD as PCP - General (Internal Medicine) Arminda Resides, MD as Consulting Physician (Dermatology) Mast, Man X, NP as Nurse Practitioner (Internal Medicine)  Extended Emergency Contact Information Primary Emergency Contact: Vicencio,Bill Address: [address]          [place], [place] [address] [place] of [place] Home Phone: [phone] Mobile Phone: [phone] Relation: Brother Secondary Emergency Contact: Isla Pence [place] of [place] Mobile Phone: [phone] Relation: Relative  Code Status:  Goals of Care: Advanced Directive information    01/04/2023   10:50 AM  Advanced Directives  Does Patient Have a Medical Advance Directive? Yes  Type of Estate agent of [place];Out of facility DNR (pink MOST or yellow form)  Does patient want to make changes to medical advance directive? No - Patient declined  Copy of Healthcare Power of Attorney in Chart? Yes - validated most recent copy scanned in chart (See row information)  Pre-existing out of facility DNR order (yellow form or pink MOST form) Yellow form placed in chart (order not valid for inpatient use)      No chief complaint on file.   HPI: Patient is a 89 y.o. male seen today for  routine visit for chronic disease management.  Pt seen and examined in his room. He is sitting in wheelchair watching news. He needs assistance with all his ADLS and meal tray set up. Seems pleasant and comfortable.  He knows his name, tell me that he has been staying at [facility] for the past 7 yrs and prior to that lived in [place]. Pt denies chest pain, sob, abdominal pain, nausea, vomiting, dysuria, hematuria , bloody or dark stools.  Denies joint pains. As per nursing staff  he is able to transfer from bed to  chair. No recent falls He coughs while swallowing, refused swallow test in the past.  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   Cervical spondylosis without myelopathy 08/22/2015   Diverticulosis    Diverticulosis of colon without hemorrhage 08/22/2015   Edema 08/18/2015   Fall 07/17/2015   Gait disturbance 08/22/2015   GERD (gastroesophageal reflux disease)    occurs rarely   History of CVA (cerebrovascular accident) 07/17/2015   HLD (hyperlipidemia) 07/17/2015   Hyperlipidemia    Hypertension    Inguinal hernia    Left carotid bruit    Melanoma (HCC)    left forearm   Osteopenia    Paroxysmal atrial fibrillation (HCC)    Physical deconditioning 08/22/2015   Retinal hemorrhage 04/24/2014   Rosacea blepharoconjunctivitis 11/20/2011   Stroke (HCC) 3/07   affected balance   Tubular adenoma of colon 2007   Weakness 07/17/2015   Past Surgical History:  Procedure Laterality Date   COLONOSCOPY  2008   EYE SURGERY Right    cataract extraction with IOL   INGUINAL HERNIA REPAIR Right 03/15/2013   Procedure: HERNIA REPAIR INGUINAL ADULT;  Surgeon: Tawanna Cooler  Laurie Panda, MD;  Location: [facility];  Service: General;  Laterality: Right;   INSERTION OF MESH Right 03/15/2013   Procedure: INSERTION OF MESH;  Surgeon: Adolph Pollack, MD;  Location: [facility];  Service: General;  Laterality: Right;   left forearm melanoma     PROSTATE BIOPSY     SKIN SURGERY  08/04/2020   TONSILLECTOMY  1933    reports that he quit smoking about 50 years ago. His smoking use included cigarettes. He has never used smokeless tobacco. He reports that he  does not drink alcohol and does not use drugs. Social History   Socioeconomic History   Marital status: Divorced    Spouse name: Not on file   Number of children: Not on file   Years of education: Not on file   Highest education level: Not on file  Occupational History   Occupation: Retired Airline pilot    Comment: Sales  Tobacco Use   Smoking status: Former    Current packs/day: 0.00    Types: Cigarettes    Quit date: 11/05/1972    Years since quitting: 50.4   Smokeless tobacco: Never  Vaping Use   Vaping status: Never Used  Substance and Sexual Activity   Alcohol use: No    Comment:  3 ounces wine nightly   Drug use: No   Sexual activity: Not on file  Other Topics Concern   Not on file  Social History Narrative   Diet? Regular/normal      Do you drink/eat things with caffeine? no      Marital status?      divorced                              What year were you married? 1957      Do you live in a house, apartment, assisted living, condo, trailer, etc.? Apartment. Moved to [place] 08/21/2015      Is it one or more stories? 2      How many persons live in your home? Live alone      Do you have any pets in your home? (please list) no      Current or past profession: Sales      Do you exercise?      "I do now."                            Type & how often? Exercise bike, 3 times weekly      Do you have a living will? no      Do you have a DNR form?    yes                              If not, do you want to discuss one?      Do you have signed POA/HPOA for forms?                Social Determinants of Health   Financial Resource Strain: Not on file  Food Insecurity: Not on file  Transportation Needs: Not on file  Physical Activity: Not on file  Stress: Not on file  Social Connections: Not on file  Intimate Partner Violence: Not on file    Functional Status Survey:    Family History  Problem Relation Age of Onset   Cancer Mother  lung and breast   Heart  disease Father        MI   Cancer Sister        pancreatic    Health Maintenance  Topic Date Due   Zoster Vaccines- Shingrix (2 of 2) 01/29/2014   COVID-19 Vaccine (8 - 2023-24 season) 02/27/2023   Medicare Annual Wellness (AWV)  12/14/2023   DTaP/Tdap/Td (2 - Td or Tdap) 07/15/2025   Pneumonia Vaccine 40+ Years old  Completed   HPV VACCINES  Aged Out   INFLUENZA VACCINE  Discontinued    Allergies  Allergen Reactions   Aspirin    Egg-Derived Products Swelling    throat    Outpatient Encounter Medications as of 04/22/2023  Medication Sig   ammonium lactate (LAC-HYDRIN) 12 % lotion Apply 1 application. topically at bedtime.   cholecalciferol (VITAMIN D) 400 units TABS tablet Take 400 Units by mouth 2 (two) times daily.   clopidogrel (PLAVIX) 75 MG tablet Take 75 mg by mouth daily. Start PLAVIX from 07/24/15 if no further hematuria is reported by patient and HB > 12.   digoxin (LANOXIN) 0.125 MG tablet Take 0.125 mg by mouth daily.   dutasteride (AVODART) 0.5 MG capsule Take 0.5 mg by mouth daily.    furosemide (LASIX) 40 MG tablet Take 40 mg by mouth daily.   isosorbide mononitrate (IMDUR) 30 MG 24 hr tablet Take 0.5 tablets (15 mg total) by mouth daily.   levothyroxine (SYNTHROID, LEVOTHROID) 50 MCG tablet Take 50 mcg by mouth daily before breakfast.   nitroGLYCERIN (NITROSTAT) 0.4 MG SL tablet Place 1 tablet under the tongue as needed.   nystatin (MYCOSTATIN/NYSTOP) powder Apply 1 Application topically 3 (three) times daily.   pantoprazole (PROTONIX) 40 MG tablet Take 1 tablet (40 mg total) by mouth daily.   polyethylene glycol (MIRALAX / GLYCOLAX) 17 g packet Take 17 g by mouth daily as needed.   POTASSIUM CHLORIDE ER PO Take 1 capsule by mouth every other day.   pravastatin (PRAVACHOL) 20 MG tablet Take 1 tablet by mouth at bedtime.   sodium fluoride (PREVIDENT 5000 PLUS) 1.1 % CREA dental cream Place 1 Application onto teeth every evening.   No facility-administered  encounter medications on file as of 04/22/2023.    Review of Systems  Constitutional:  Negative for fever.  HENT:  Negative for sore throat.   Respiratory:  Negative for cough, shortness of breath and wheezing.   Cardiovascular:  Positive for leg swelling. Negative for chest pain and palpitations.  Gastrointestinal:  Negative for abdominal distention, abdominal pain, blood in stool, constipation, diarrhea, nausea and vomiting.  Genitourinary:  Positive for frequency. Negative for dysuria.    There were no vitals filed for this visit. There is no height or weight on file to calculate BMI. Physical Exam Constitutional:      Appearance: Normal appearance.  HENT:     Head: Normocephalic and atraumatic.  Cardiovascular:     Rate and Rhythm: Normal rate and regular rhythm.     Pulses: Normal pulses.     Heart sounds: Normal heart sounds.  Pulmonary:     Effort: No respiratory distress.     Breath sounds: No stridor. No wheezing or rales.  Abdominal:     General: Bowel sounds are normal. There is no distension.     Palpations: Abdomen is soft.     Tenderness: There is no abdominal tenderness. There is no right CVA tenderness or guarding.  Musculoskeletal:  General: Swelling present.     Comments: Venous dermatitis Erythematous, mild swelling 1+ pitting oedema Dries scaling of skin noted in both legs   Neurological:     Mental Status: He is alert. Mental status is at baseline.     Sensory: No sensory deficit.     Motor: No weakness.     Labs reviewed: Basic Metabolic Panel: Recent Labs    06/15/22 0000 12/24/22 0000  NA 139 141  K 4.5 4.1  CL 101 100  CO2 35* 34*  BUN 38* 30*  CREATININE 1.4* 1.2  CALCIUM 9.3 9.3   Liver Function Tests: Recent Labs    12/24/22 0000  AST 13*  ALT 7*  ALKPHOS 76  ALBUMIN 3.7   No results for input(s): "LIPASE", "AMYLASE" in the last 8760 hours. No results for input(s): "AMMONIA" in the last 8760 hours. CBC: Recent Labs     12/24/22 0000  WBC 5.9  NEUTROABS 2,985.00  HGB 13.5  HCT 41  PLT 250   Cardiac Enzymes: No results for input(s): "CKTOTAL", "CKMB", "CKMBINDEX", "TROPONINI" in the last 8760 hours. BNP: Invalid input(s): "POCBNP" No results found for: "HGBA1C" Lab Results  Component Value Date   TSH 3.96 12/24/2022   No results found for: "VITAMINB12" No results found for: "FOLATE" No results found for: "IRON", "TIBC", "FERRITIN"  Imaging and Procedures obtained prior to SNF admission: No results found.  Assessment/Plan  1. Paroxysmal atrial fibrillation (HCC) Rate controlled On digoxin  Declined anticoagulation as per previous charting Will check digoxin levels  2. Acquired hypothyroidism Lab Results  Component Value Date   TSH 3.96 12/24/2022   Cont with synthyroid  3. Benign prostatic hyperplasia without lower urinary tract symptoms Pt c/o frequency and nocturia  Will cont with dutasteroid   4. Coronary artery disease involving native coronary artery of native heart without angina pectoris Stable  Cont with plavix, pravastatin, imdur, lasix  5. Venous stasis dermatitis of both lower extremities Instructed patient to keep his feet elevated  Cont with lasix, potassium supplements Cont with cerave  6. Gastroesophageal reflux disease without esophagitis Denies acid reflux Cont with protonix    Dysphagia As per staff pt coughs while swallowing Pt agreed for swallow eval    Family/ staff Communication: care plan discussed with the nursing staff  Labs/tests ordered: digoxin level  I spent greater than 45  minutes for the care of this patient in face to face time, chart review, clinical documentation, patient education.

## 2023-04-26 DIAGNOSIS — I480 Paroxysmal atrial fibrillation: Secondary | ICD-10-CM | POA: Diagnosis not present

## 2023-05-05 DIAGNOSIS — R1312 Dysphagia, oropharyngeal phase: Secondary | ICD-10-CM | POA: Diagnosis not present

## 2023-05-12 DIAGNOSIS — R1312 Dysphagia, oropharyngeal phase: Secondary | ICD-10-CM | POA: Diagnosis not present

## 2023-05-20 DIAGNOSIS — R1312 Dysphagia, oropharyngeal phase: Secondary | ICD-10-CM | POA: Diagnosis not present

## 2023-06-01 ENCOUNTER — Encounter (HOSPITAL_COMMUNITY): Payer: Self-pay

## 2023-06-01 ENCOUNTER — Other Ambulatory Visit: Payer: Self-pay

## 2023-06-01 ENCOUNTER — Emergency Department (HOSPITAL_COMMUNITY): Payer: Medicare Other

## 2023-06-01 ENCOUNTER — Emergency Department (HOSPITAL_COMMUNITY)
Admission: EM | Admit: 2023-06-01 | Discharge: 2023-06-02 | Disposition: A | Discharge status: Home / Self Care | Payer: Medicare Other | Attending: Emergency Medicine | Admitting: Emergency Medicine

## 2023-06-01 DIAGNOSIS — S0083XA Contusion of other part of head, initial encounter: Secondary | ICD-10-CM | POA: Diagnosis not present

## 2023-06-01 DIAGNOSIS — Z87891 Personal history of nicotine dependence: Secondary | ICD-10-CM | POA: Insufficient documentation

## 2023-06-01 DIAGNOSIS — Z7902 Long term (current) use of antithrombotics/antiplatelets: Secondary | ICD-10-CM | POA: Insufficient documentation

## 2023-06-01 DIAGNOSIS — N183 Chronic kidney disease, stage 3 unspecified: Secondary | ICD-10-CM | POA: Diagnosis not present

## 2023-06-01 DIAGNOSIS — E039 Hypothyroidism, unspecified: Secondary | ICD-10-CM | POA: Diagnosis not present

## 2023-06-01 DIAGNOSIS — W19XXXA Unspecified fall, initial encounter: Secondary | ICD-10-CM

## 2023-06-01 DIAGNOSIS — S199XXA Unspecified injury of neck, initial encounter: Secondary | ICD-10-CM | POA: Diagnosis not present

## 2023-06-01 DIAGNOSIS — G319 Degenerative disease of nervous system, unspecified: Secondary | ICD-10-CM | POA: Diagnosis not present

## 2023-06-01 DIAGNOSIS — S0990XA Unspecified injury of head, initial encounter: Secondary | ICD-10-CM | POA: Diagnosis not present

## 2023-06-01 DIAGNOSIS — I6523 Occlusion and stenosis of bilateral carotid arteries: Secondary | ICD-10-CM | POA: Diagnosis not present

## 2023-06-01 DIAGNOSIS — R0689 Other abnormalities of breathing: Secondary | ICD-10-CM | POA: Diagnosis not present

## 2023-06-01 DIAGNOSIS — W1812XA Fall from or off toilet with subsequent striking against object, initial encounter: Secondary | ICD-10-CM | POA: Insufficient documentation

## 2023-06-01 DIAGNOSIS — Z79899 Other long term (current) drug therapy: Secondary | ICD-10-CM | POA: Insufficient documentation

## 2023-06-01 DIAGNOSIS — I129 Hypertensive chronic kidney disease with stage 1 through stage 4 chronic kidney disease, or unspecified chronic kidney disease: Secondary | ICD-10-CM | POA: Diagnosis not present

## 2023-06-01 DIAGNOSIS — I2510 Atherosclerotic heart disease of native coronary artery without angina pectoris: Secondary | ICD-10-CM | POA: Insufficient documentation

## 2023-06-01 DIAGNOSIS — Z85828 Personal history of other malignant neoplasm of skin: Secondary | ICD-10-CM | POA: Insufficient documentation

## 2023-06-01 DIAGNOSIS — S0993XA Unspecified injury of face, initial encounter: Secondary | ICD-10-CM | POA: Diagnosis present

## 2023-06-01 DIAGNOSIS — I959 Hypotension, unspecified: Secondary | ICD-10-CM | POA: Diagnosis not present

## 2023-06-01 LAB — COMPREHENSIVE METABOLIC PANEL
ALT: 15 U/L (ref 0–44)
AST: 20 U/L (ref 15–41)
Albumin: 3.2 g/dL — ABNORMAL LOW (ref 3.5–5.0)
Alkaline Phosphatase: 59 U/L (ref 38–126)
Anion gap: 8 (ref 5–15)
BUN: 37 mg/dL — ABNORMAL HIGH (ref 8–23)
CO2: 29 mmol/L (ref 22–32)
Calcium: 9.2 mg/dL (ref 8.9–10.3)
Chloride: 100 mmol/L (ref 98–111)
Creatinine, Ser: 1.43 mg/dL — ABNORMAL HIGH (ref 0.61–1.24)
GFR, Estimated: 45 mL/min — ABNORMAL LOW (ref >60)
Glucose, Bld: 138 mg/dL — ABNORMAL HIGH (ref 70–99)
Potassium: 4.1 mmol/L (ref 3.5–5.1)
Sodium: 137 mmol/L (ref 135–145)
Total Bilirubin: 0.6 mg/dL (ref <1.2)
Total Protein: 6.4 g/dL — ABNORMAL LOW (ref 6.5–8.1)

## 2023-06-01 LAB — CBC WITH DIFFERENTIAL/PLATELET
Abs Immature Granulocytes: 0.04 10*3/uL (ref 0.00–0.07)
Basophils Absolute: 0.1 10*3/uL (ref 0.0–0.1)
Basophils Relative: 1 %
Eosinophils Absolute: 0.4 10*3/uL (ref 0.0–0.5)
Eosinophils Relative: 5 %
HCT: 40.5 % (ref 39.0–52.0)
Hemoglobin: 13 g/dL (ref 13.0–17.0)
Immature Granulocytes: 1 %
Lymphocytes Relative: 16 %
Lymphs Abs: 1.1 10*3/uL (ref 0.7–4.0)
MCH: 29.7 pg (ref 26.0–34.0)
MCHC: 32.1 g/dL (ref 30.0–36.0)
MCV: 92.5 fL (ref 80.0–100.0)
Monocytes Absolute: 0.6 10*3/uL (ref 0.1–1.0)
Monocytes Relative: 9 %
Neutro Abs: 4.4 10*3/uL (ref 1.7–7.7)
Neutrophils Relative %: 68 %
Platelets: 242 10*3/uL (ref 150–400)
RBC: 4.38 MIL/uL (ref 4.22–5.81)
RDW: 13.7 % (ref 11.5–15.5)
WBC: 6.5 10*3/uL (ref 4.0–10.5)
nRBC: 0 % (ref 0.0–0.2)

## 2023-06-01 LAB — DIGOXIN LEVEL: Digoxin Level: 1.1 ng/mL (ref 0.8–2.0)

## 2023-06-01 MED ORDER — SODIUM CHLORIDE 0.9 % IV BOLUS
1000.0000 mL | Freq: Once | INTRAVENOUS | Status: AC
  Administered 2023-06-01: 1000 mL via INTRAVENOUS

## 2023-06-01 NOTE — ED Triage Notes (Addendum)
Pt bib ems from [facility]; unwitnessed fall, pt found on bathroom floor after getting up off toilet; pt a and o x 4, denies LOC; bruising to R face, ems reports skin tears to bilateral knees; pt on Plavix; pt reports feeling "discomfort" in c collar; orthostatic with ems, systolic went from 106 to 82 when moving from lying to sitting

## 2023-06-01 NOTE — ED Provider Notes (Signed)
[facility] DEPARTMENT AT [facility] Provider Note  CSN: [id number] Arrival date & time: 06/01/23 1806  Chief Complaint(s) No chief complaint on file.  HPI Cody [name] is a 89 y.o. male history of prior stroke, paroxysmal atrial fibrillation on digoxin, coronary artery disease on Plavix, presenting to the emergency department with fall.  Patient reports he was getting up off the toilet, when he stood up he felt weak and then fell, striking the right side of his head.  He was not able to get up on his own.  Paramedics were called, report that patient had low blood pressure when sitting in improved blood pressure when lying flat.  He reports otherwise he has been feeling well.  No chest pain, shortness of breath, abdominal pain, pain in his arms or legs, pain in his neck or back.  No recent fevers or chills, dysuria, cough, or any other new symptoms.   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   Cervical spondylosis without myelopathy 08/22/2015   Diverticulosis    Diverticulosis of colon without hemorrhage 08/22/2015   Edema 08/18/2015   Fall 07/17/2015   Gait disturbance 08/22/2015   GERD (gastroesophageal reflux disease)    occurs rarely   History of CVA (cerebrovascular accident) 07/17/2015   HLD (hyperlipidemia) 07/17/2015   Hyperlipidemia    Hypertension    Inguinal hernia    Left carotid bruit    Melanoma (HCC)    left forearm   Osteopenia    Paroxysmal atrial fibrillation (HCC)    Physical deconditioning 08/22/2015   Retinal hemorrhage 04/24/2014   Rosacea blepharoconjunctivitis 11/20/2011   Stroke (HCC) 3/07   affected balance   Tubular adenoma of colon 2007   Weakness 07/17/2015   Patient Active Problem List    Diagnosis Date Noted   Venous stasis dermatitis of both lower extremities 06/04/2022   Candidal skin infection 04/16/2022   Atypical chest pain 02/25/2022   Osteoarthritis, multiple sites 07/28/2021   Pressure ulcer of right buttock, stage 2 (HCC) 12/01/2020   Impaired memory 03/06/2020   Skin lesion 03/06/2020   Angina pectoris (HCC) 06/22/2018   CKD (chronic kidney disease) stage 3, GFR 30-59 ml/min (HCC) 12/20/2017   Paroxysmal atrial fibrillation (HCC) 12/20/2017   PVC (premature ventricular contraction) 03/22/2017   Vitamin D deficiency 03/22/2017   Acquired hypothyroidism 03/22/2017   PVD (peripheral vascular disease) (HCC) 09/23/2016   HTN (hypertension) 02/26/2016   Dysphagia 02/26/2016   Irregular heart beats 09/18/2015   Cervical spondylosis without myelopathy 08/22/2015   Physical deconditioning 08/22/2015   Unsteady gait 08/22/2015   Tubular adenoma of colon 08/22/2015   Weight loss 08/22/2015   Diverticulosis of colon without hemorrhage 08/22/2015   Melanoma of skin (HCC) 08/22/2015   Left carotid bruit 08/22/2015   Osteopenia 08/22/2015   GERD (gastroesophageal reflux disease) 08/22/2015   Edema 08/18/2015   Fall 07/17/2015   Balance problem 07/17/2015   History of CVA (cerebrovascular accident) 07/17/2015   BPH (benign prostatic hyperplasia) 07/17/2015   Hyperlipidemia LDL goal <70 07/17/2015   Cataract, nuclear 08/08/2014   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  Authorizing Provider  ammonium lactate (LAC-HYDRIN) 12 % lotion Apply 1 application. topically at bedtime.    [provider]  cholecalciferol (VITAMIN D) 400 units TABS tablet Take 400 Units by mouth 2 (two) times daily.    [provider]  clopidogrel (PLAVIX) 75 MG tablet Take 75 mg by mouth daily. Start PLAVIX from 07/24/15 if no further hematuria is reported  by patient and HB > 12.    [provider]  digoxin (LANOXIN) 0.125 MG tablet Take 0.125 mg by mouth daily.    [provider]  dutasteride (AVODART) 0.5 MG capsule Take 0.5 mg by mouth daily.     [provider]  furosemide (LASIX) 40 MG tablet Take 40 mg by mouth daily.    [provider]  isosorbide mononitrate (IMDUR) 30 MG 24 hr tablet Take 0.5 tablets (15 mg total) by mouth daily. 07/04/20   Mahlon Gammon, MD  levothyroxine (SYNTHROID, LEVOTHROID) 50 MCG tablet Take 50 mcg by mouth daily before breakfast.    [provider]  nitroGLYCERIN (NITROSTAT) 0.4 MG SL tablet Place 1 tablet under the tongue as needed. 01/19/19   [provider]  nystatin (MYCOSTATIN/NYSTOP) powder Apply 1 Application topically 3 (three) times daily. 08/11/22   Medina-Vargas, Monina C, NP  pantoprazole (PROTONIX) 40 MG tablet Take 1 tablet (40 mg total) by mouth daily. 07/04/20   Mahlon Gammon, MD  polyethylene glycol (MIRALAX / GLYCOLAX) 17 g packet Take 17 g by mouth daily as needed.    [provider]  potassium chloride SA (KLOR-CON M) 20 MEQ tablet Take 20 mEq by mouth daily. 02/20/23   [provider]  pravastatin (PRAVACHOL) 20 MG tablet Take 1 tablet by mouth at bedtime. 07/29/18   [provider]  sodium fluoride (PREVIDENT 5000 PLUS) 1.1 % CREA dental cream Place 1 Application onto teeth every evening.    [provider]                                                                                                                                    Past Surgical History Past Surgical History:  Procedure Laterality Date   COLONOSCOPY  2008   EYE SURGERY Right    cataract extraction with IOL   INGUINAL HERNIA REPAIR Right 03/15/2013   Procedure: HERNIA REPAIR INGUINAL ADULT;  Surgeon: Adolph Pollack, MD;  Location: [facility];  Service: General;  Laterality: Right;   INSERTION OF MESH Right 03/15/2013   Procedure: INSERTION  OF MESH;  Surgeon: Adolph Pollack, MD;  Location: [facility];  Service: General;  Laterality: Right;   left forearm melanoma     PROSTATE BIOPSY     SKIN SURGERY  08/04/2020   TONSILLECTOMY  1933   Family History Family History  Problem Relation Age of Onset   Cancer Mother        lung and breast   Heart  disease Father        MI   Cancer Sister        pancreatic    Social History Social History   Tobacco Use   Smoking status: Former    Current packs/day: 0.00    Types: Cigarettes    Quit date: 11/05/1972    Years since quitting: 50.6   Smokeless tobacco: Never  Vaping Use   Vaping status: Never Used  Substance Use Topics   Alcohol use: No    Comment:  3 ounces wine nightly   Drug use: No   Allergies Aspirin and Egg-derived products  Review of Systems Review of Systems  All other systems reviewed and are negative.   Physical Exam Vital Signs  I have reviewed the triage vital signs BP 123/74   Pulse (!) 102   Temp 97.6 F (36.4 C)   Resp 19   SpO2 98%  Physical Exam Vitals and nursing note reviewed.  Constitutional:      General: He is not in acute distress.    Appearance: Normal appearance.  HENT:     Head: Normocephalic.     Comments: Small amount of bruising to the right side of the face, no facial instability, no nasal septal hematoma or deformity    Mouth/Throat:     Mouth: Mucous membranes are moist.  Eyes:     Conjunctiva/sclera: Conjunctivae normal.  Cardiovascular:     Rate and Rhythm: Normal rate and regular rhythm.  Pulmonary:     Effort: Pulmonary effort is normal. No respiratory distress.     Breath sounds: Normal breath sounds.  Abdominal:     General: Abdomen is flat.     Palpations: Abdomen is soft.     Tenderness: There is no abdominal tenderness.  Musculoskeletal:     Right lower leg: No edema.     Left lower leg: No edema.     Comments: No midline C, T, L-spine tenderness.  No chest wall tenderness or crepitus.  Full painless  range of motion at the bilateral upper extremities including the shoulders, elbows, wrists, hand and fingers, and in the bilateral lower extremities including the hips, knees, ankle, toes.  No focal bony tenderness, injury or deformity.   Skin:    General: Skin is warm and dry.     Capillary Refill: Capillary refill takes less than 2 seconds.     Comments: Chronic bilateral venous stasis changes of the lower extremity, small skin tear on both shins  Neurological:     Mental Status: He is alert and oriented to person, place, and time. Mental status is at baseline.  Psychiatric:        Mood and Affect: Mood normal.        Behavior: Behavior normal.     ED Results and Treatments Labs (all labs ordered are listed, but only abnormal results are displayed) Labs Reviewed  COMPREHENSIVE METABOLIC PANEL  CBC WITH DIFFERENTIAL/PLATELET  Radiology No results found.  Pertinent labs & imaging results that were available during my care of the patient were reviewed by me and considered in my medical decision making (see MDM for details).  Medications Ordered in ED Medications  sodium chloride 0.9 % bolus 1,000 mL (has no administration in time range)                                                                                                                                     Procedures Procedures  (including critical care time)  Medical Decision Making / ED Course   MDM:  89 year old male presenting to the emergency department after falling after he got up from the toilet.  EMS do report some orthostatic vital signs.  Patient is overall very well-appearing in the emergency department, denies significant pain.  He mainly is complaining that he thinks his cervical collar is uncomfortable.  Given fall while taking Plavix, will check CT head and neck.  There are some  reported orthostatic vital signs, patient appears mildly dehydrated, will give fluids and check some basic labs.  He is also on digoxin for his atrial fibrillation.  Will check a digoxin level.  Will reassess.      Additional history obtained: -Additional history obtained from {[email or web address]:28072} -External records from outside source obtained and reviewed including: Chart review including previous notes, labs, imaging, consultation notes including ***   Lab Tests: -I ordered, reviewed, and interpreted labs.   The pertinent results include:   Labs Reviewed  COMPREHENSIVE METABOLIC PANEL  CBC WITH DIFFERENTIAL/PLATELET    Notable for ***  EKG   EKG Interpretation Date/Time:    Ventricular Rate:    PR Interval:    QRS Duration:    QT Interval:    QTC Calculation:   R Axis:      Text Interpretation:           Imaging Studies ordered: I ordered imaging studies including *** On my interpretation imaging demonstrates *** I independently visualized and interpreted imaging. I agree with the radiologist interpretation   Medicines ordered and prescription drug management: Meds ordered this encounter  Medications   sodium chloride 0.9 % bolus 1,000 mL    -I have reviewed the patients home medicines and have made adjustments as needed   Consultations Obtained: I requested consultation with the ***,  and discussed lab and imaging findings as well as pertinent plan - they recommend: ***   Cardiac Monitoring: The patient was maintained on a cardiac monitor.  I personally viewed and interpreted the cardiac monitored which showed an underlying rhythm of: ***  Social Determinants of Health:  Diagnosis or treatment significantly limited by social determinants of health: {wssoc:28071}   Reevaluation: After the interventions noted above, I reevaluated the patient and found that their symptoms have {resolved/improved/worsened:23923::"improved"}  Co morbidities that  complicate the patient evaluation  Past Medical History:  Diagnosis Date  Anginal pain (HCC)    nuclear stress 03/12/13 EPIC   Balance problem 07/17/2015   Basal cell carcinoma    left forearm   BPH (benign prostatic hyperplasia)    Cataract, nuclear 08/08/2014   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   Cervical spondylosis without myelopathy 08/22/2015   Diverticulosis    Diverticulosis of colon without hemorrhage 08/22/2015   Edema 08/18/2015   Fall 07/17/2015   Gait disturbance 08/22/2015   GERD (gastroesophageal reflux disease)    occurs rarely   History of CVA (cerebrovascular accident) 07/17/2015   HLD (hyperlipidemia) 07/17/2015   Hyperlipidemia    Hypertension    Inguinal hernia    Left carotid bruit    Melanoma (HCC)    left forearm   Osteopenia    Paroxysmal atrial fibrillation (HCC)    Physical deconditioning 08/22/2015   Retinal hemorrhage 04/24/2014   Rosacea blepharoconjunctivitis 11/20/2011   Stroke (HCC) 3/07   affected balance   Tubular adenoma of colon 2007   Weakness 07/17/2015      Dispostion: Disposition decision including need for hospitalization was considered, and patient {wsdispo:28070::"discharged from emergency department."}    Final Clinical Impression(s) / ED Diagnoses Final diagnoses:  None     This chart was dictated using voice recognition software.  Despite best efforts to proofread,  errors can occur which can change the documentation meaning.

## 2023-06-01 NOTE — Discharge Instructions (Signed)
We evaluated you for your fall.  Your testing in the emergency department is reassuring.  Please discuss with [facility] to see if they can provide additional help to prevent future falls.  Be careful when standing up suddenly as this can sometimes make you lightheaded and high risk for fall.  Please take at 1000 mg of Tylenol every 6 hours as needed for pain.  Please return if you have any new or worsening symptoms.

## 2023-06-02 ENCOUNTER — Encounter: Payer: Self-pay | Admitting: Nurse Practitioner

## 2023-06-02 ENCOUNTER — Non-Acute Institutional Stay: Payer: Medicare Other | Admitting: Nurse Practitioner

## 2023-06-02 DIAGNOSIS — E039 Hypothyroidism, unspecified: Secondary | ICD-10-CM

## 2023-06-02 DIAGNOSIS — I480 Paroxysmal atrial fibrillation: Secondary | ICD-10-CM

## 2023-06-02 DIAGNOSIS — I209 Angina pectoris, unspecified: Secondary | ICD-10-CM | POA: Diagnosis not present

## 2023-06-02 DIAGNOSIS — I872 Venous insufficiency (chronic) (peripheral): Secondary | ICD-10-CM | POA: Diagnosis not present

## 2023-06-02 DIAGNOSIS — S0083XA Contusion of other part of head, initial encounter: Secondary | ICD-10-CM | POA: Insufficient documentation

## 2023-06-02 DIAGNOSIS — K219 Gastro-esophageal reflux disease without esophagitis: Secondary | ICD-10-CM | POA: Diagnosis not present

## 2023-06-02 DIAGNOSIS — S0083XD Contusion of other part of head, subsequent encounter: Secondary | ICD-10-CM | POA: Diagnosis not present

## 2023-06-02 DIAGNOSIS — N400 Enlarged prostate without lower urinary tract symptoms: Secondary | ICD-10-CM

## 2023-06-02 DIAGNOSIS — W19XXXA Unspecified fall, initial encounter: Secondary | ICD-10-CM | POA: Diagnosis not present

## 2023-06-02 DIAGNOSIS — N1831 Chronic kidney disease, stage 3a: Secondary | ICD-10-CM

## 2023-06-02 DIAGNOSIS — E785 Hyperlipidemia, unspecified: Secondary | ICD-10-CM | POA: Diagnosis not present

## 2023-06-02 DIAGNOSIS — R1312 Dysphagia, oropharyngeal phase: Secondary | ICD-10-CM

## 2023-06-02 DIAGNOSIS — Z66 Do not resuscitate: Secondary | ICD-10-CM

## 2023-06-02 NOTE — Assessment & Plan Note (Signed)
Close supervision, assistance needed for safety.

## 2023-06-02 NOTE — Assessment & Plan Note (Signed)
Venous dermatitis, improved BLE erythema, thick scaly skin BLE, sensitive to touch, s/p Dermatology eval, Aveeno moisturizer              Edema BLE, on Furosemide, chronic swelling.

## 2023-06-02 NOTE — Assessment & Plan Note (Signed)
stable, on Levothyroxine qd. TSH 3.96 12/24/22

## 2023-06-02 NOTE — Progress Notes (Signed)
Location:   [facility] Room Number: 813-A Place of Service:  ALF (13) Provider: Arna Snipe Kody Brandl NP  Venita Sheffield, MD  Patient Care Team: Venita Sheffield, MD as PCP - General (Internal Medicine) Arminda Resides, MD as Consulting Physician (Dermatology) Adisyn Ruscitti X, NP as Nurse Practitioner (Internal Medicine)  Extended Emergency Contact Information Primary Emergency Contact: Lever,Bill Address: [address]          [place], [place] [address] [place] Home Phone: [phone] Mobile Phone: [phone] Relation: Brother Secondary Emergency Contact: Isla Pence [place] of [place] Mobile Phone: [phone] Relation: Relative  Code Status: DNR Goals of care: Advanced Directive information    06/02/2023    4:00 PM  Advanced Directives  Does Patient Have a Medical Advance Directive? Yes  Type of Estate agent of [place];Out of facility DNR (pink MOST or yellow form)  Does patient want to make changes to medical advance directive? No - Patient declined  Copy of Healthcare Power of Attorney in Chart? Yes - validated most recent copy scanned in chart (See row information)  Pre-existing out of facility DNR order (yellow form or pink MOST form) Yellow form placed in chart (order not valid for inpatient use)     Chief Complaint  Patient presents with   Follow-up    ED follow up.    HPI:  Pt is a 89 y.o. male seen today for an acute visit for f/u ED eval 05/30/23 for fall. The patient stated he fell in the bathroom when his legs gave away, yelled help, assisted to get off the floor with assistance. Denied headache, loss of consciousness, chest pain, palpitation, or SOB. Resulted right jaw area contusion.   CT head/cervical spine 06/01/23 showed  1. No acute intracranial abnormality. 2. 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 3. No acute fracture or static subluxation of  the cervical spine.  Venous dermatitis, improved BLE erythema, thick scaly skin BLE, sensitive to touch, s/p Dermatology eval, Aveeno moisturizer              Edema BLE, on Furosemide, chronic swelling.              The right buttock pressure ulcer  is healed with a scar tissue, short lived pain in the area come and goes, positional             Hx of Afib, heart rate is in control, on Digoxin. Dig level 1.4 04/26/23  , takes Plavix             Hypothyroidism, stable, on Levothyroxine qd. TSH 3.96 12/24/22             Urinary frequency, stable, on Dutasteride 0.5mg  qd             CKD Bun/cret 37/1.43 06/01/23             Dysphagia, declined ST or MBSS, cough associated with swallowing.              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   Cervical spondylosis without myelopathy 08/22/2015   Diverticulosis    Diverticulosis of colon without hemorrhage 08/22/2015   Edema 08/18/2015   Fall 07/17/2015   Gait disturbance 08/22/2015   GERD (gastroesophageal reflux disease)    occurs rarely   History of CVA (cerebrovascular accident) 07/17/2015   HLD (hyperlipidemia) 07/17/2015   Hyperlipidemia    Hypertension    Inguinal hernia    Left carotid bruit    Melanoma (HCC)    left forearm   Osteopenia    Paroxysmal atrial fibrillation (HCC)    Physical deconditioning 08/22/2015   Retinal hemorrhage 04/24/2014   Rosacea blepharoconjunctivitis 11/20/2011   Stroke (HCC) 3/07   affected balance   Tubular adenoma of colon 2007   Weakness 07/17/2015    Past Surgical History:  Procedure Laterality Date   COLONOSCOPY  2008   EYE SURGERY Right    cataract extraction with IOL   INGUINAL HERNIA REPAIR Right 03/15/2013   Procedure: HERNIA REPAIR INGUINAL ADULT;  Surgeon: Adolph Pollack, MD;  Location: [facility];  Service: General;  Laterality: Right;   INSERTION OF MESH Right 03/15/2013   Procedure: INSERTION OF MESH;  Surgeon: Adolph Pollack, MD;  Location: [facility];  Service: General;  Laterality: Right;   left forearm melanoma     PROSTATE BIOPSY     SKIN SURGERY  08/04/2020   TONSILLECTOMY  1933    Allergies  Allergen Reactions   Aspirin    Egg-Derived Products Swelling    throat    Allergies as of 06/02/2023       Reactions   Aspirin    Egg-derived Products Swelling   throat        Medication List        Accurate as of June 02, 2023 11:59 PM. If you have any questions, ask your nurse or doctor.          ammonium lactate 12 % lotion Commonly known as: LAC-HYDRIN Apply 1 application. topically at bedtime.   cholecalciferol 10 MCG (400 UNIT) Tabs tablet Commonly known as: VITAMIN D3 Take 400 Units by mouth 2 (two) times daily.   clopidogrel 75 MG tablet Commonly known as: PLAVIX Take 75 mg by mouth daily. Start PLAVIX from 07/24/15 if no further hematuria is reported by patient and HB > 12.   digoxin 0.125 MG tablet Commonly known as: LANOXIN Take 0.125 mg by mouth daily.   dutasteride 0.5 MG capsule Commonly known as: AVODART Take 0.5 mg by mouth daily.   furosemide 40 MG tablet Commonly known as: LASIX Take 40 mg by mouth daily.   isosorbide mononitrate 30 MG 24 hr tablet Commonly known as: IMDUR Take 0.5 tablets (15 mg total) by mouth daily.   levothyroxine 50 MCG tablet Commonly known as: SYNTHROID Take 50 mcg by mouth daily before breakfast.   nitroGLYCERIN 0.4 MG SL tablet Commonly known as: NITROSTAT Place 1 tablet under the tongue as needed.   nystatin powder Commonly known as:  MYCOSTATIN/NYSTOP Apply 1 Application topically 3 (three) times daily.   pantoprazole 40 MG tablet Commonly known as: PROTONIX Take 1 tablet (40 mg total) by mouth daily.   polyethylene glycol 17 g packet Commonly known as: MIRALAX / GLYCOLAX Take 17 g by mouth daily as needed.   potassium chloride SA 20 MEQ tablet Commonly  known as: KLOR-CON M Take 20 mEq by mouth daily.   pravastatin 20 MG tablet Commonly known as: PRAVACHOL Take 1 tablet by mouth at bedtime.   sodium fluoride 1.1 % Crea dental cream Commonly known as: PREVIDENT 5000 PLUS Place 1 Application onto teeth every evening.        Review of Systems  Constitutional:  Negative for fatigue, fever and unexpected weight change.  HENT:  Positive for hearing loss and trouble swallowing. Negative for congestion.        Cough while eating.   Eyes:  Negative for visual disturbance.  Respiratory:  Negative for cough, chest tightness, shortness of breath and wheezing.   Cardiovascular:  Positive for leg swelling. Negative for chest pain and palpitations.  Gastrointestinal:  Negative for abdominal pain and constipation.  Genitourinary:  Positive for frequency. Negative for dysuria and urgency.  Musculoskeletal:  Positive for arthralgias and gait problem.  Skin:        Right buttock scar tissue from healed previous pressure ulcer. Thick scaly skin, erythematous swelling BLE-persisted R jaw/neck/under the right ear contusion from the fall, no s/s of infection  Neurological:  Negative for speech difficulty, weakness and headaches.  Psychiatric/Behavioral:  Negative for behavioral problems and sleep disturbance. The patient is not nervous/anxious.     Immunization History  Administered Date(s) Administered   Moderna Covid-19 Vaccine Bivalent Booster 60yrs & up 11/11/2021   Moderna Sars-Covid-2 Vaccination 07/06/2019, 08/02/2019, 05/06/2020, 11/25/2020   PNEUMOCOCCAL CONJUGATE-20 12/20/2022   PPD Test 07/21/2015, 08/04/2015    Pfizer Covid-19 Vaccine Bivalent Booster 15yrs & up 04/29/2022   Tdap 07/16/2015   Unspecified SARS-COV-2 Vaccination 03/17/2021   Zoster Recombinant(Shingrix) 12/04/2013   Pertinent  Health Maintenance Due  Topic Date Due   INFLUENZA VACCINE  Discontinued      07/04/2020   11:15 AM 08/05/2020    2:48 PM 09/12/2020   10:19 AM 04/16/2022    2:46 PM 12/14/2022    1:05 PM  Fall Risk  Falls in the past year? 0 0 0 0 0  Was there an injury with Fall?  0  0 0  Fall Risk Category Calculator  0  0 0  Fall Risk Category (Retired)  Low  Low   (RETIRED) Patient Fall Risk Level  Low fall risk  Low fall risk   Patient at Risk for Falls Due to    No Fall Risks No Fall Risks  Fall risk Follow up    Falls evaluation completed Falls evaluation completed   Functional Status Survey:    Vitals:   06/02/23 1555  BP: 103/62  Pulse: 80  Resp: 20  Temp: 98.2 F (36.8 C)  SpO2: 93%  Weight: 139 lb (63 kg)  Height: 6\' 1"  (1.854 m)   Body mass index is 18.34 kg/m. Physical Exam Vitals and nursing note reviewed.  Constitutional:      Appearance: Normal appearance.  HENT:     Head: Normocephalic and atraumatic.     Mouth/Throat:     Mouth: Mucous membranes are moist.  Eyes:     Extraocular Movements: Extraocular movements intact.     Conjunctiva/sclera: Conjunctivae normal.     Pupils: Pupils are equal, round, and reactive to light.  Cardiovascular:     Rate and Rhythm: Normal rate. Rhythm irregular.     Heart sounds: No murmur heard. Pulmonary:     Effort: Pulmonary effort is normal.     Breath sounds: Rales present.     Comments: Bibasilar rales, L>R  Abdominal:     Palpations: Abdomen is soft.     Tenderness: There is no abdominal tenderness.     Comments: Irregular schedule BM, but no constipation.   Musculoskeletal:     Cervical back: Normal range of motion and neck supple.     Right lower leg: Edema present.     Left lower leg: Edema present.     Comments: 1+ edema BLE   Skin:    General: Skin is warm and dry.     Findings: Bruising and erythema present.     Comments: Chronic venous insufficiency skin changes. Thick scaly skin, erythematous swelling BLE, mild warmth and sensitive to touch Right buttock scar tissue from healed previous pressure ulcer. Lateral left lower leg skin lesion.   Overall dry skin appearance.  Right jaw/neck/under the right ear contusion from the fall, able to open and close mouth/chewing motion w/o pain, no ear pain when tragus pressed or auricle moved.   Neurological:     General: No focal deficit present.     Mental Status: He is alert and oriented to person, place, and time. Mental status is at baseline.     Gait: Gait abnormal.  Psychiatric:        Mood and Affect: Mood normal.        Behavior: Behavior normal.        Thought Content: Thought content normal.        Judgment: Judgment normal.     Labs reviewed: Recent Labs    06/15/22 0000 12/24/22 0000 06/01/23 1830  NA 139 141 137  K 4.5 4.1 4.1  CL 101 100 100  CO2 35* 34* 29  GLUCOSE  --   --  138*  BUN 38* 30* 37*  CREATININE 1.4* 1.2 1.43*  CALCIUM 9.3 9.3 9.2   Recent Labs    12/24/22 0000 06/01/23 1830  AST 13* 20  ALT 7* 15  ALKPHOS 76 59  BILITOT  --  0.6  PROT  --  6.4*  ALBUMIN 3.7 3.2*   Recent Labs    12/24/22 0000 06/01/23 1830  WBC 5.9 6.5  NEUTROABS 2,985.00 4.4  HGB 13.5 13.0  HCT 41 40.5  MCV  --  92.5  PLT 250 242   Lab Results  Component Value Date   TSH 3.96 12/24/2022   No results found for: "HGBA1C" Lab Results  Component Value Date   CHOL 147 12/24/2022   HDL 42 12/24/2022   LDLCALC 85 12/24/2022   TRIG 100 12/24/2022   CHOLHDL 2.6 07/10/2019    Significant Diagnostic Results in last 30 days:  CT HEAD WO CONTRAST  Result Date: 06/01/2023 CLINICAL DATA:  Trauma.  Fall. EXAM: CT HEAD WITHOUT CONTRAST CT CERVICAL SPINE WITHOUT CONTRAST TECHNIQUE: Multidetector CT imaging of the head and cervical spine was  performed following the standard protocol without intravenous contrast. Multiplanar CT image reconstructions of the cervical spine were also generated. RADIATION DOSE REDUCTION: This exam was performed according to the departmental dose-optimization program which includes automated exposure control, adjustment of the mA and/or kV according to patient size and/or use of iterative reconstruction technique. COMPARISON:  None Available. FINDINGS: CT HEAD FINDINGS Brain: There is no mass, hemorrhage or extra-axial collection. There is generalized atrophy without lobar predilection. There is hypoattenuation of the periventricular white matter, most commonly indicating chronic ischemic microangiopathy. Old right parietal and left cerebellar infarcts. Vascular: Atherosclerotic calcification of the internal carotid arteries at the skull base. No abnormal hyperdensity of the  major intracranial arteries or dural venous sinuses. Skull: The visualized skull base, calvarium and extracranial soft tissues are normal. Sinuses/Orbits: No fluid levels or advanced mucosal thickening of the visualized paranasal sinuses. No mastoid or middle ear effusion. The orbits are normal. CT CERVICAL SPINE FINDINGS Alignment: Grade 1 anterolisthesis at C4-5. Facets are aligned. Occipital condyles are normally positioned. Skull base and vertebrae: No acute fracture. Soft tissues and spinal canal: No prevertebral fluid or swelling. No visible canal hematoma. Disc levels: No spinal canal stenosis. Multilevel facet arthrosis, left-greater-than-right. Upper chest: No pneumothorax, pulmonary nodule or pleural effusion. Other: Normal visualized paraspinal cervical soft tissues. IMPRESSION: 1. No acute intracranial abnormality. 2. 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 3. No acute fracture or static subluxation of the cervical spine. Electronically Signed   By: Deatra Robinson M.D.   On: 06/01/2023 20:09   CT  CERVICAL SPINE WO CONTRAST  Result Date: 06/01/2023 CLINICAL DATA:  Trauma.  Fall. EXAM: CT HEAD WITHOUT CONTRAST CT CERVICAL SPINE WITHOUT CONTRAST TECHNIQUE: Multidetector CT imaging of the head and cervical spine was performed following the standard protocol without intravenous contrast. Multiplanar CT image reconstructions of the cervical spine were also generated. RADIATION DOSE REDUCTION: This exam was performed according to the departmental dose-optimization program which includes automated exposure control, adjustment of the mA and/or kV according to patient size and/or use of iterative reconstruction technique. COMPARISON:  None Available. FINDINGS: CT HEAD FINDINGS Brain: There is no mass, hemorrhage or extra-axial collection. There is generalized atrophy without lobar predilection. There is hypoattenuation of the periventricular white matter, most commonly indicating chronic ischemic microangiopathy. Old right parietal and left cerebellar infarcts. Vascular: Atherosclerotic calcification of the internal carotid arteries at the skull base. No abnormal hyperdensity of the major intracranial arteries or dural venous sinuses. Skull: The visualized skull base, calvarium and extracranial soft tissues are normal. Sinuses/Orbits: No fluid levels or advanced mucosal thickening of the visualized paranasal sinuses. No mastoid or middle ear effusion. The orbits are normal. CT CERVICAL SPINE FINDINGS Alignment: Grade 1 anterolisthesis at C4-5. Facets are aligned. Occipital condyles are normally positioned. Skull base and vertebrae: No acute fracture. Soft tissues and spinal canal: No prevertebral fluid or swelling. No visible canal hematoma. Disc levels: No spinal canal stenosis. Multilevel facet arthrosis, left-greater-than-right. Upper chest: No pneumothorax, pulmonary nodule or pleural effusion. Other: Normal visualized paraspinal cervical soft tissues. IMPRESSION: 1. No acute intracranial abnormality. 2. Chronic  ischemic microangiopathy and old right parietal and left cerebellar infarcts. 3. No acute fracture or static subluxation of the cervical spine. Electronically Signed   By: Deatra Robinson M.D.   On: 06/01/2023 20:09    Assessment/Plan: Contusion of face   f/u ED eval 05/30/23 for fall. The patient stated he fell in the bathroom when his legs gave away, yelled help, assisted to get off the floor with assistance. Denied headache, loss of consciousness, chest pain, palpitation, or SOB. Resulted right jaw area contusion.   CT head/cervical spine 06/01/23 showed  1. No acute intracranial abnormality. 2. 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 3. No acute fracture or static subluxation of the cervical spine.  Fall Close supervision, assistance needed for safety.   Venous stasis dermatitis of both lower extremities Venous dermatitis, improved BLE erythema, thick scaly skin BLE, sensitive to touch, s/p Dermatology eval, Aveeno moisturizer              Edema BLE, on Furosemide, chronic swelling.   Paroxysmal  atrial fibrillation (HCC) heart rate is in control, on Digoxin. Dig level 1.4 04/26/23 , takes Plavix  Acquired hypothyroidism  stable, on Levothyroxine qd. TSH 3.96 12/24/22  BPH (benign prostatic hyperplasia) , stable, on Dutasteride 0.5mg  qd  CKD (chronic kidney disease) stage 3, GFR 30-59 ml/min (HCC) Bun/cret 37/1.43 06/01/23  Dysphagia declined ST or MBSS, cough associated with swallowing.   GERD (gastroesophageal reflux disease)  takes Pantoprazole, Hgb 13.0 06/01/23  Hyperlipidemia LDL goal <70  takes Pravastatin, LDL 85 12/24/22  Angina pectoris (HCC) No chest pain reported, takes Plavix, Isosorbide, prn NTG    Family/ staff Communication: plan of care reviewed with the patient and charge nurse.   Labs/tests ordered: none  Time spend 40 minutes.

## 2023-06-02 NOTE — Assessment & Plan Note (Signed)
f/u ED eval 05/30/23 for fall. The patient stated he fell in the bathroom when his legs gave away, yelled help, assisted to get off the floor with assistance. Denied headache, loss of consciousness, chest pain, palpitation, or SOB. Resulted right jaw area contusion.   CT head/cervical spine 06/01/23 showed  1. No acute intracranial abnormality. 2. 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 3. No acute fracture or static subluxation of the cervical spine.

## 2023-06-02 NOTE — Assessment & Plan Note (Signed)
,   stable, on Dutasteride 0.5mg  qd

## 2023-06-02 NOTE — Progress Notes (Signed)
Location:  [facility] Room Number: 813-A Place of Service:  ALF [phone]) Provider:  , Sonia Baller, MD  Patient Care Team: Venita Sheffield, MD as PCP - General (Internal Medicine) Arminda Resides, MD as Consulting Physician (Dermatology) ,  X, NP as Nurse Practitioner (Internal Medicine)  Extended Emergency Contact Information Primary Emergency Contact: Tietje,Bill Address: [address]          [place], [place] [address] [place] Home Phone: [phone] Mobile Phone: [phone] Relation: Brother Secondary Emergency Contact: Isla Pence [place] Mobile Phone: [phone] Relation: Relative  Code Status: DNR Goals of care: Advanced Directive information    06/02/2023    4:00 PM  Advanced Directives  Does Patient Have a Medical Advance Directive? Yes  Type of Estate agent of [place];Out of facility DNR (pink MOST or yellow form)  Does patient want to make changes to medical advance directive? No - Patient declined  Copy of Healthcare Power of Attorney in Chart? Yes - validated most recent copy scanned in chart (See row information)  Pre-existing out of facility DNR order (yellow form or pink MOST form) Yellow form placed in chart (order not valid for inpatient use)     Chief Complaint  Patient presents with   Follow-up    ED follow up.    HPI:  Pt is a 89 y.o. 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   Cervical spondylosis without myelopathy 08/22/2015   Diverticulosis    Diverticulosis of colon without hemorrhage 08/22/2015   Edema 08/18/2015   Fall 07/17/2015   Gait disturbance 08/22/2015    GERD (gastroesophageal reflux disease)    occurs rarely   History of CVA (cerebrovascular accident) 07/17/2015   HLD (hyperlipidemia) 07/17/2015   Hyperlipidemia    Hypertension    Inguinal hernia    Left carotid bruit    Melanoma (HCC)    left forearm   Osteopenia    Paroxysmal atrial fibrillation (HCC)    Physical deconditioning 08/22/2015   Retinal hemorrhage 04/24/2014   Rosacea blepharoconjunctivitis 11/20/2011   Stroke (HCC) 3/07   affected balance   Tubular adenoma of colon 2007   Weakness 07/17/2015   Past Surgical History:  Procedure Laterality Date   COLONOSCOPY  2008   EYE SURGERY Right    cataract extraction with IOL   INGUINAL HERNIA REPAIR Right 03/15/2013   Procedure: HERNIA REPAIR INGUINAL ADULT;  Surgeon: Adolph Pollack, MD;  Location: [facility];  Service: General;  Laterality: Right;   INSERTION OF MESH Right 03/15/2013   Procedure: INSERTION OF MESH;  Surgeon: Adolph Pollack, MD;  Location: [facility];  Service: General;  Laterality: Right;   left forearm melanoma     PROSTATE BIOPSY     SKIN SURGERY  08/04/2020   TONSILLECTOMY  1933    Allergies  Allergen Reactions   Aspirin    Egg-Derived Products Swelling    throat    Outpatient Encounter Medications as of 06/02/2023  Medication Sig   ammonium lactate (LAC-HYDRIN) 12 % lotion Apply 1 application. topically at bedtime.   cholecalciferol (VITAMIN D) 400 units TABS tablet Take 400 Units  by mouth 2 (two) times daily.   clopidogrel (PLAVIX) 75 MG tablet Take 75 mg by mouth daily. Start PLAVIX from 07/24/15 if no further hematuria is reported by patient and HB > 12.   digoxin (LANOXIN) 0.125 MG tablet Take 0.125 mg by mouth daily.   dutasteride (AVODART) 0.5 MG capsule Take 0.5 mg by mouth daily.    furosemide (LASIX) 40 MG tablet Take 40 mg by mouth daily.   isosorbide mononitrate (IMDUR) 30 MG 24 hr tablet Take 0.5 tablets (15 mg total) by mouth daily.   levothyroxine (SYNTHROID, LEVOTHROID) 50 MCG tablet  Take 50 mcg by mouth daily before breakfast.   nitroGLYCERIN (NITROSTAT) 0.4 MG SL tablet Place 1 tablet under the tongue as needed.   pantoprazole (PROTONIX) 40 MG tablet Take 1 tablet (40 mg total) by mouth daily.   polyethylene glycol (MIRALAX / GLYCOLAX) 17 g packet Take 17 g by mouth daily as needed.   potassium chloride SA (KLOR-CON M) 20 MEQ tablet Take 20 mEq by mouth daily.   pravastatin (PRAVACHOL) 20 MG tablet Take 1 tablet by mouth at bedtime.   sodium fluoride (PREVIDENT 5000 PLUS) 1.1 % CREA dental cream Place 1 Application onto teeth every evening.   nystatin (MYCOSTATIN/NYSTOP) powder Apply 1 Application topically 3 (three) times daily. (Patient not taking: Reported on 06/02/2023)   No facility-administered encounter medications on file as of 06/02/2023.    Review of Systems  Immunization History  Administered Date(s) Administered   Moderna Covid-19 Vaccine Bivalent Booster 48yrs & up 11/11/2021   Moderna Sars-Covid-2 Vaccination 07/06/2019, 08/02/2019, 05/06/2020, 11/25/2020   PNEUMOCOCCAL CONJUGATE-20 12/20/2022   PPD Test 07/21/2015, 08/04/2015   Pfizer Covid-19 Vaccine Bivalent Booster 9yrs & up 04/29/2022   Tdap 07/16/2015   Unspecified SARS-COV-2 Vaccination 03/17/2021   Zoster Recombinant(Shingrix) 12/04/2013   Pertinent  Health Maintenance Due  Topic Date Due   INFLUENZA VACCINE  Discontinued      07/04/2020   11:15 AM 08/05/2020    2:48 PM 09/12/2020   10:19 AM 04/16/2022    2:46 PM 12/14/2022    1:05 PM  Fall Risk  Falls in the past year? 0 0 0 0 0  Was there an injury with Fall?  0  0 0  Fall Risk Category Calculator  0  0 0  Fall Risk Category (Retired)  Low  Low   (RETIRED) Patient Fall Risk Level  Low fall risk  Low fall risk   Patient at Risk for Falls Due to    No Fall Risks No Fall Risks  Fall risk Follow up    Falls evaluation completed Falls evaluation completed   Functional Status Survey:    Vitals:   06/02/23 1555  BP: 103/62  Pulse: 80   Resp: 20  Temp: 98.2 F (36.8 C)  SpO2: 93%  Weight: 139 lb (63 kg)  Height: 6\' 1"  (1.854 m)   Body mass index is 18.34 kg/m. Physical Exam  Labs reviewed: Recent Labs    06/15/22 0000 12/24/22 0000 06/01/23 1830  NA 139 141 137  K 4.5 4.1 4.1  CL 101 100 100  CO2 35* 34* 29  GLUCOSE  --   --  138*  BUN 38* 30* 37*  CREATININE 1.4* 1.2 1.43*  CALCIUM 9.3 9.3 9.2   Recent Labs    12/24/22 0000 06/01/23 1830  AST 13* 20  ALT 7* 15  ALKPHOS 76 59  BILITOT  --  0.6  PROT  --  6.4*  ALBUMIN 3.7 3.2*   Recent Labs    12/24/22 0000 06/01/23 1830  WBC 5.9 6.5  NEUTROABS 2,985.00 4.4  HGB 13.5 13.0  HCT 41 40.5  MCV  --  92.5  PLT 250 242   Lab Results  Component Value Date   TSH 3.96 12/24/2022   No results found for: "HGBA1C" Lab Results  Component Value Date   CHOL 147 12/24/2022   HDL 42 12/24/2022   LDLCALC 85 12/24/2022   TRIG 100 12/24/2022   CHOLHDL 2.6 07/10/2019    Significant Diagnostic Results in last 30 days:  CT HEAD WO CONTRAST  Result Date: 06/01/2023 CLINICAL DATA:  Trauma.  Fall. EXAM: CT HEAD WITHOUT CONTRAST CT CERVICAL SPINE WITHOUT CONTRAST TECHNIQUE: Multidetector CT imaging of the head and cervical spine was performed following the standard protocol without intravenous contrast. Multiplanar CT image reconstructions of the cervical spine were also generated. RADIATION DOSE REDUCTION: This exam was performed according to the departmental dose-optimization program which includes automated exposure control, adjustment of the mA and/or kV according to patient size and/or use of iterative reconstruction technique. COMPARISON:  None Available. FINDINGS: CT HEAD FINDINGS Brain: There is no mass, hemorrhage or extra-axial collection. There is generalized atrophy without lobar predilection. There is hypoattenuation of the periventricular white matter, most commonly indicating chronic ischemic microangiopathy. Old right parietal and left  cerebellar infarcts. Vascular: Atherosclerotic calcification of the internal carotid arteries at the skull base. No abnormal hyperdensity of the major intracranial arteries or dural venous sinuses. Skull: The visualized skull base, calvarium and extracranial soft tissues are normal. Sinuses/Orbits: No fluid levels or advanced mucosal thickening of the visualized paranasal sinuses. No mastoid or middle ear effusion. The orbits are normal. CT CERVICAL SPINE FINDINGS Alignment: Grade 1 anterolisthesis at C4-5. Facets are aligned. Occipital condyles are normally positioned. Skull base and vertebrae: No acute fracture. Soft tissues and spinal canal: No prevertebral fluid or swelling. No visible canal hematoma. Disc levels: No spinal canal stenosis. Multilevel facet arthrosis, left-greater-than-right. Upper chest: No pneumothorax, pulmonary nodule or pleural effusion. Other: Normal visualized paraspinal cervical soft tissues. IMPRESSION: 1. No acute intracranial abnormality. 2. 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 3. No acute fracture or static subluxation of the cervical spine. Electronically Signed   By: Deatra Robinson M.D.   On: 06/01/2023 20:09   CT CERVICAL SPINE WO CONTRAST  Result Date: 06/01/2023 CLINICAL DATA:  Trauma.  Fall. EXAM: CT HEAD WITHOUT CONTRAST CT CERVICAL SPINE WITHOUT CONTRAST TECHNIQUE: Multidetector CT imaging of the head and cervical spine was performed following the standard protocol without intravenous contrast. Multiplanar CT image reconstructions of the cervical spine were also generated. RADIATION DOSE REDUCTION: This exam was performed according to the departmental dose-optimization program which includes automated exposure control, adjustment of the mA and/or kV according to patient size and/or use of iterative reconstruction technique. COMPARISON:  None Available. FINDINGS: CT HEAD FINDINGS Brain: There is no mass, hemorrhage or  extra-axial collection. There is generalized atrophy without lobar predilection. There is hypoattenuation of the periventricular white matter, most commonly indicating chronic ischemic microangiopathy. Old right parietal and left cerebellar infarcts. Vascular: Atherosclerotic calcification of the internal carotid arteries at the skull base. No abnormal hyperdensity of the major intracranial arteries or dural venous sinuses. Skull: The visualized skull base, calvarium and extracranial soft tissues are normal. Sinuses/Orbits: No fluid levels or advanced mucosal thickening of the visualized paranasal sinuses. No mastoid or middle ear effusion. The orbits are normal. CT  CERVICAL SPINE FINDINGS Alignment: Grade 1 anterolisthesis at C4-5. Facets are aligned. Occipital condyles are normally positioned. Skull base and vertebrae: No acute fracture. Soft tissues and spinal canal: No prevertebral fluid or swelling. No visible canal hematoma. Disc levels: No spinal canal stenosis. Multilevel facet arthrosis, left-greater-than-right. Upper chest: No pneumothorax, pulmonary nodule or pleural effusion. Other: Normal visualized paraspinal cervical soft tissues. IMPRESSION: 1. No acute intracranial abnormality. 2. 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 3. No acute fracture or static subluxation of the cervical spine. Electronically Signed   By: Deatra Robinson M.D.   On: 06/01/2023 20:09    Assessment/Plan 1. DNR (do not resuscitate) ***    Family/ staff Communication: ***  Labs/tests ordered:  ***

## 2023-06-02 NOTE — Assessment & Plan Note (Signed)
No chest pain reported, takes Plavix, Isosorbide, prn NTG

## 2023-06-02 NOTE — Assessment & Plan Note (Signed)
heart rate is in control, on Digoxin. Dig level 1.4 04/26/23 , takes Plavix

## 2023-06-02 NOTE — Assessment & Plan Note (Signed)
takes Pantoprazole, Hgb 13.0 06/01/23

## 2023-06-02 NOTE — Assessment & Plan Note (Signed)
takes Pravastatin, LDL 85 12/24/22

## 2023-06-02 NOTE — Assessment & Plan Note (Signed)
declined ST or MBSS, cough associated with swallowing.  

## 2023-06-02 NOTE — Assessment & Plan Note (Signed)
Bun/cret 37/1.43 06/01/23

## 2023-06-03 ENCOUNTER — Encounter: Payer: Self-pay | Admitting: Nurse Practitioner

## 2023-06-03 ENCOUNTER — Non-Acute Institutional Stay: Payer: Self-pay | Admitting: Nurse Practitioner

## 2023-06-03 DIAGNOSIS — R531 Weakness: Secondary | ICD-10-CM | POA: Diagnosis not present

## 2023-06-03 DIAGNOSIS — N400 Enlarged prostate without lower urinary tract symptoms: Secondary | ICD-10-CM | POA: Diagnosis not present

## 2023-06-03 DIAGNOSIS — I209 Angina pectoris, unspecified: Secondary | ICD-10-CM | POA: Diagnosis not present

## 2023-06-03 DIAGNOSIS — I872 Venous insufficiency (chronic) (peripheral): Secondary | ICD-10-CM

## 2023-06-03 DIAGNOSIS — K219 Gastro-esophageal reflux disease without esophagitis: Secondary | ICD-10-CM | POA: Diagnosis not present

## 2023-06-03 DIAGNOSIS — R413 Other amnesia: Secondary | ICD-10-CM

## 2023-06-03 DIAGNOSIS — E039 Hypothyroidism, unspecified: Secondary | ICD-10-CM

## 2023-06-03 DIAGNOSIS — I480 Paroxysmal atrial fibrillation: Secondary | ICD-10-CM | POA: Diagnosis not present

## 2023-06-03 DIAGNOSIS — N1831 Chronic kidney disease, stage 3a: Secondary | ICD-10-CM

## 2023-06-04 DIAGNOSIS — I480 Paroxysmal atrial fibrillation: Secondary | ICD-10-CM | POA: Diagnosis not present

## 2023-06-04 DIAGNOSIS — R609 Edema, unspecified: Secondary | ICD-10-CM | POA: Diagnosis not present

## 2023-06-04 LAB — CBC: RBC: 4.19 (ref 3.87–5.11)

## 2023-06-04 LAB — COMPREHENSIVE METABOLIC PANEL
Albumin: 3.5 (ref 3.5–5.0)
Calcium: 9.2 (ref 8.7–10.7)
Globulin: 2.8
eGFR: 54

## 2023-06-04 LAB — HEPATIC FUNCTION PANEL
ALT: 14 U/L (ref 10–40)
AST: 24 (ref 14–40)
Alkaline Phosphatase: 69 (ref 25–125)
Bilirubin, Total: 0.9

## 2023-06-04 LAB — BASIC METABOLIC PANEL
BUN: 36 — AB (ref 4–21)
CO2: 33 — AB (ref 13–22)
Chloride: 101 (ref 99–108)
Creatinine: 1.2 (ref 0.6–1.3)
Glucose: 99
Potassium: 4.2 meq/L (ref 3.5–5.1)
Sodium: 140 (ref 137–147)

## 2023-06-04 LAB — CBC AND DIFFERENTIAL
HCT: 38 — AB (ref 41–53)
Hemoglobin: 12.8 — AB (ref 13.5–17.5)
Neutrophils Absolute: 2847
Platelets: 232 10*3/uL (ref 150–400)
WBC: 4.9

## 2023-06-06 ENCOUNTER — Encounter: Payer: Self-pay | Admitting: Nurse Practitioner

## 2023-06-06 NOTE — Assessment & Plan Note (Signed)
Bun/cret 36/1.23 06/04/23

## 2023-06-06 NOTE — Assessment & Plan Note (Signed)
No cheat pain reported,  takes Plavix, Isosorbide, prn NTG

## 2023-06-06 NOTE — Assessment & Plan Note (Signed)
c/o generalized weakness, afraid of falling, nor foal neurological symptoms, afebrile, declined SNF [facility] for ADLs.  Update CBC/diff, CMP/eGFR, VS q4hr x72 hours 06/04/23 wbc 4.9, Hgb 12.8, plt 232, neutrophils 58.1, Na 140, K 4.2, Bun 36, creat 1.23

## 2023-06-06 NOTE — Assessment & Plan Note (Signed)
stable, on Dutasteride 0.5mg qd 

## 2023-06-06 NOTE — Assessment & Plan Note (Signed)
 heart rate is in control, on Digoxin. Dig level 1.4 04/26/23 , takes Plavix

## 2023-06-06 NOTE — Assessment & Plan Note (Signed)
takes Pantoprazole, Hgb 12.8 06/04/23

## 2023-06-06 NOTE — Assessment & Plan Note (Signed)
needs SNF care.

## 2023-06-06 NOTE — Assessment & Plan Note (Signed)
stable, on Levothyroxine qd. TSH 3.96 12/24/22

## 2023-06-06 NOTE — Assessment & Plan Note (Addendum)
worsened BLE erythema, noted fever,  thick scaly skin BLE, sensitive to touch, s/p Dermatology eval, Aveeno moisturizer,  on Furosemide, chronic swelling.  Trial of 7 day course of Doxycycline 100mg  bid, observe

## 2023-06-06 NOTE — Progress Notes (Signed)
Location:   [facility] Room Number: 813 Place of Service:  ALF (13) Provider: Arna Snipe Lyal Husted NP  Venita Sheffield, MD  Patient Care Team: Venita Sheffield, MD as PCP - General (Internal Medicine) Arminda Resides, MD as Consulting Physician (Dermatology) Dahna Hattabaugh X, NP as Nurse Practitioner (Internal Medicine)  Extended Emergency Contact Information Primary Emergency Contact: Harriott,Bill Address: [address]          [place], [place] [address] [place] of [place] Home Phone: [phone] Mobile Phone: [phone] Relation: Brother Secondary Emergency Contact: Isla Pence [place] of [place] Mobile Phone: [phone] Relation: Relative  Code Status: DNR Goals of care: Advanced Directive information    06/02/2023    4:00 PM  Advanced Directives  Does Patient Have a Medical Advance Directive? Yes  Type of Estate agent of [place];Out of facility DNR (pink MOST or yellow form)  Does patient want to make changes to medical advance directive? No - Patient declined  Copy of Healthcare Power of Attorney in Chart? Yes - validated most recent copy scanned in chart (See row information)  Pre-existing out of facility DNR order (yellow form or pink MOST form) Yellow form placed in chart (order not valid for inpatient use)     Chief Complaint  Patient presents with   Acute Visit    Generalized weakness    HPI:  Pt is a 89 y.o. male seen today for an acute visit for c/o generalized weakness, afraid of falling, nor foal neurological symptoms, afebrile, declined SNF [facility] for ADLs.      ED eval 05/30/23 for fall. The patient stated he fell in the bathroom when his legs gave away, yelled help, assisted to get off the floor with assistance. Denied headache, loss of consciousness, chest pain, palpitation, or SOB. Resulted right jaw area contusion.              CT head/cervical spine 06/01/23 showed  1. No acute intracranial abnormality. 2.  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 3. No acute fracture or static subluxation of the cervical spine.             Venous dermatitis, worsened BLE erythema, noted fever,  thick scaly skin BLE, sensitive to touch, s/p Dermatology eval, Aveeno moisturizer              Edema BLE, on Furosemide, chronic swelling.              The right buttock pressure ulcer  is healed with a scar tissue, short lived pain in the area come and goes, positional             Hx of Afib, heart rate is in control, on Digoxin. Dig level 1.4 04/26/23  , takes Plavix             Hypothyroidism, stable, on Levothyroxine qd. TSH 3.96 12/24/22             Urinary frequency, stable, on Dutasteride 0.5mg  qd             CKD Bun/cret 36/1.23 06/04/23             Dysphagia, declined ST or MBSS, cough associated with swallowing.              GERD takes Pantoprazole, Hgb 12.8 06/04/23             Hyperlipidemia, takes Pravastatin, LDL 85 12/24/22  CAD/angina pectoris, takes Plavix, Isosorbide, prn NTG             Impaired memory, needs SNF care.   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   Cervical spondylosis without myelopathy 08/22/2015   Diverticulosis    Diverticulosis of colon without hemorrhage 08/22/2015   Edema 08/18/2015   Fall 07/17/2015   Gait disturbance 08/22/2015   GERD (gastroesophageal reflux disease)    occurs rarely   History of CVA (cerebrovascular accident) 07/17/2015   HLD (hyperlipidemia) 07/17/2015   Hyperlipidemia    Hypertension    Inguinal hernia    Left carotid bruit    Melanoma (HCC)    left forearm   Osteopenia    Paroxysmal atrial fibrillation (HCC)    Physical deconditioning 08/22/2015   Retinal hemorrhage  04/24/2014   Rosacea blepharoconjunctivitis 11/20/2011   Stroke (HCC) 3/07   affected balance   Tubular adenoma of colon 2007   Weakness 07/17/2015   Past Surgical History:  Procedure Laterality Date   COLONOSCOPY  2008   EYE SURGERY Right    cataract extraction with IOL   INGUINAL HERNIA REPAIR Right 03/15/2013   Procedure: HERNIA REPAIR INGUINAL ADULT;  Surgeon: Adolph Pollack, MD;  Location: [facility];  Service: General;  Laterality: Right;   INSERTION OF MESH Right 03/15/2013   Procedure: INSERTION OF MESH;  Surgeon: Adolph Pollack, MD;  Location: [facility];  Service: General;  Laterality: Right;   left forearm melanoma     PROSTATE BIOPSY     SKIN SURGERY  08/04/2020   TONSILLECTOMY  1933    Allergies  Allergen Reactions   Aspirin    Egg-Derived Products Swelling    throat    Allergies as of 06/03/2023       Reactions   Aspirin    Egg-derived Products Swelling   throat        Medication List        Accurate as of June 03, 2023 11:59 PM. If you have any questions, ask your nurse or doctor.          ammonium lactate 12 % lotion Commonly known as: LAC-HYDRIN Apply 1 application. topically at bedtime.   cholecalciferol 10 MCG (400 UNIT) Tabs tablet Commonly known as: VITAMIN D3 Take 400 Units by mouth 2 (two) times daily.   clopidogrel 75 MG tablet Commonly known as: PLAVIX Take 75 mg by mouth daily. Start PLAVIX from 07/24/15 if no further hematuria is reported by patient and HB > 12.   digoxin 0.125 MG tablet Commonly known as: LANOXIN Take 0.125 mg by mouth daily.   dutasteride 0.5 MG capsule Commonly known as: AVODART Take 0.5 mg by mouth daily.   furosemide 40 MG tablet Commonly known as: LASIX Take 40 mg by mouth daily.   isosorbide mononitrate 30 MG 24 hr tablet Commonly known as: IMDUR Take 0.5 tablets (15 mg total) by mouth daily.   levothyroxine 50 MCG tablet Commonly known as: SYNTHROID Take 50 mcg by mouth daily before  breakfast.   nitroGLYCERIN 0.4 MG SL tablet Commonly known as: NITROSTAT Place 1 tablet under the tongue as needed.   nystatin powder Commonly known as: MYCOSTATIN/NYSTOP Apply 1 Application topically 3 (three) times daily.  pantoprazole 40 MG tablet Commonly known as: PROTONIX Take 1 tablet (40 mg total) by mouth daily.   polyethylene glycol 17 g packet Commonly known as: MIRALAX / GLYCOLAX Take 17 g by mouth daily as needed.   potassium chloride SA 20 MEQ tablet Commonly known as: KLOR-CON M Take 20 mEq by mouth daily.   pravastatin 20 MG tablet Commonly known as: PRAVACHOL Take 1 tablet by mouth at bedtime.   sodium fluoride 1.1 % Crea dental cream Commonly known as: PREVIDENT 5000 PLUS Place 1 Application onto teeth every evening.        Review of Systems  Constitutional:  Positive for fatigue. Negative for fever and unexpected weight change.  HENT:  Positive for hearing loss and trouble swallowing. Negative for congestion.        Cough while eating.   Eyes:  Negative for visual disturbance.  Respiratory:  Negative for cough, chest tightness, shortness of breath and wheezing.   Cardiovascular:  Positive for leg swelling. Negative for chest pain and palpitations.  Gastrointestinal:  Negative for abdominal pain and constipation.  Genitourinary:  Positive for frequency. Negative for dysuria and urgency.  Musculoskeletal:  Positive for arthralgias and gait problem.  Skin:        Right buttock scar tissue from healed previous pressure ulcer. Thick scaly skin, erythematous swelling BLE-worsened, noted warmth R jaw/neck/under the right ear contusion from the fall, no s/s of infection  Neurological:  Negative for speech difficulty, weakness and headaches.  Psychiatric/Behavioral:  Negative for behavioral problems and sleep disturbance. The patient is not nervous/anxious.     Immunization History  Administered Date(s) Administered   Moderna Covid-19 Vaccine Bivalent  Booster 32yrs & up 11/11/2021   Moderna Sars-Covid-2 Vaccination 07/06/2019, 08/02/2019, 05/06/2020, 11/25/2020   PNEUMOCOCCAL CONJUGATE-20 12/20/2022   PPD Test 07/21/2015, 08/04/2015   Pfizer Covid-19 Vaccine Bivalent Booster 52yrs & up 04/29/2022   Tdap 07/16/2015   Unspecified SARS-COV-2 Vaccination 03/17/2021   Zoster Recombinant(Shingrix) 12/04/2013   Pertinent  Health Maintenance Due  Topic Date Due   INFLUENZA VACCINE  Discontinued      07/04/2020   11:15 AM 08/05/2020    2:48 PM 09/12/2020   10:19 AM 04/16/2022    2:46 PM 12/14/2022    1:05 PM  Fall Risk  Falls in the past year? 0 0 0 0 0  Was there an injury with Fall?  0  0 0  Fall Risk Category Calculator  0  0 0  Fall Risk Category (Retired)  Low  Low   (RETIRED) Patient Fall Risk Level  Low fall risk  Low fall risk   Patient at Risk for Falls Due to    No Fall Risks No Fall Risks  Fall risk Follow up    Falls evaluation completed Falls evaluation completed   Functional Status Survey:    Vitals:   06/03/23 1116  BP: (!) 143/58  Pulse: 74  Resp: 18  Temp: 97.8 F (36.6 C)  SpO2: 97%  Weight: 139 lb (63 kg)   Body mass index is 18.34 kg/m. Physical Exam Vitals and nursing note reviewed.  Constitutional:      Appearance: Normal appearance.  HENT:     Head: Normocephalic and atraumatic.     Mouth/Throat:     Mouth: Mucous membranes are moist.  Eyes:     Extraocular Movements: Extraocular movements intact.     Conjunctiva/sclera: Conjunctivae normal.     Pupils: Pupils are equal, round, and reactive to light.  Cardiovascular:  Rate and Rhythm: Normal rate. Rhythm irregular.     Heart sounds: No murmur heard. Pulmonary:     Effort: Pulmonary effort is normal.     Breath sounds: Rales present.     Comments: Bibasilar rales, L>R Abdominal:     Palpations: Abdomen is soft.     Tenderness: There is no abdominal tenderness.     Comments: Irregular schedule BM, but no constipation.   Musculoskeletal:      Cervical back: Normal range of motion and neck supple.     Right lower leg: Edema present.     Left lower leg: Edema present.     Comments: 1+ edema BLE  Skin:    General: Skin is warm and dry.     Findings: Bruising and erythema present.     Comments: Chronic venous insufficiency skin changes. Thick scaly skin, [place] erythematous swelling BLE, noted more warmth and sensitive to touch Right buttock scar tissue from healed previous pressure ulcer. Lateral left lower leg skin lesion.   Overall dry skin appearance.  Right jaw/neck/under the right ear contusion from the fall, able to open and close mouth/chewing motion w/o pain, no ear pain when tragus pressed or auricle moved.   Neurological:     General: No focal deficit present.     Mental Status: He is alert and oriented to person, place, and time. Mental status is at baseline.     Gait: Gait abnormal.  Psychiatric:        Mood and Affect: Mood normal.        Behavior: Behavior normal.        Thought Content: Thought content normal.        Judgment: Judgment normal.     Labs reviewed: Recent Labs    06/15/22 0000 12/24/22 0000 06/01/23 1830  NA 139 141 137  K 4.5 4.1 4.1  CL 101 100 100  CO2 35* 34* 29  GLUCOSE  --   --  138*  BUN 38* 30* 37*  CREATININE 1.4* 1.2 1.43*  CALCIUM 9.3 9.3 9.2   Recent Labs    12/24/22 0000 06/01/23 1830  AST 13* 20  ALT 7* 15  ALKPHOS 76 59  BILITOT  --  0.6  PROT  --  6.4*  ALBUMIN 3.7 3.2*   Recent Labs    12/24/22 0000 06/01/23 1830  WBC 5.9 6.5  NEUTROABS 2,985.00 4.4  HGB 13.5 13.0  HCT 41 40.5  MCV  --  92.5  PLT 250 242   Lab Results  Component Value Date   TSH 3.96 12/24/2022   No results found for: "HGBA1C" Lab Results  Component Value Date   CHOL 147 12/24/2022   HDL 42 12/24/2022   LDLCALC 85 12/24/2022   TRIG 100 12/24/2022   CHOLHDL 2.6 07/10/2019    Significant Diagnostic Results in last 30 days:  CT HEAD WO CONTRAST  Result Date:  06/01/2023 CLINICAL DATA:  Trauma.  Fall. EXAM: CT HEAD WITHOUT CONTRAST CT CERVICAL SPINE WITHOUT CONTRAST TECHNIQUE: Multidetector CT imaging of the head and cervical spine was performed following the standard protocol without intravenous contrast. Multiplanar CT image reconstructions of the cervical spine were also generated. RADIATION DOSE REDUCTION: This exam was performed according to the departmental dose-optimization program which includes automated exposure control, adjustment of the mA and/or kV according to patient size and/or use of iterative reconstruction technique. COMPARISON:  None Available. FINDINGS: CT HEAD FINDINGS Brain: There is no mass, hemorrhage or extra-axial collection. There  is generalized atrophy without lobar predilection. There is hypoattenuation of the periventricular white matter, most commonly indicating chronic ischemic microangiopathy. Old right parietal and left cerebellar infarcts. Vascular: Atherosclerotic calcification of the internal carotid arteries at the skull base. No abnormal hyperdensity of the major intracranial arteries or dural venous sinuses. Skull: The visualized skull base, calvarium and extracranial soft tissues are normal. Sinuses/Orbits: No fluid levels or advanced mucosal thickening of the visualized paranasal sinuses. No mastoid or middle ear effusion. The orbits are normal. CT CERVICAL SPINE FINDINGS Alignment: Grade 1 anterolisthesis at C4-5. Facets are aligned. Occipital condyles are normally positioned. Skull base and vertebrae: No acute fracture. Soft tissues and spinal canal: No prevertebral fluid or swelling. No visible canal hematoma. Disc levels: No spinal canal stenosis. Multilevel facet arthrosis, left-greater-than-right. Upper chest: No pneumothorax, pulmonary nodule or pleural effusion. Other: Normal visualized paraspinal cervical soft tissues. IMPRESSION: 1. No acute intracranial abnormality. 2. Chronic ischemic microangiopathy and old right  parietal and left cerebellar infarcts. 3. No acute fracture or static subluxation of the cervical spine. Electronically Signed   By: Deatra Robinson M.D.   On: 06/01/2023 20:09   CT CERVICAL SPINE WO CONTRAST  Result Date: 06/01/2023 CLINICAL DATA:  Trauma.  Fall. EXAM: CT HEAD WITHOUT CONTRAST CT CERVICAL SPINE WITHOUT CONTRAST TECHNIQUE: Multidetector CT imaging of the head and cervical spine was performed following the standard protocol without intravenous contrast. Multiplanar CT image reconstructions of the cervical spine were also generated. RADIATION DOSE REDUCTION: This exam was performed according to the departmental dose-optimization program which includes automated exposure control, adjustment of the mA and/or kV according to patient size and/or use of iterative reconstruction technique. COMPARISON:  None Available. FINDINGS: CT HEAD FINDINGS Brain: There is no mass, hemorrhage or extra-axial collection. There is generalized atrophy without lobar predilection. There is hypoattenuation of the periventricular white matter, most commonly indicating chronic ischemic microangiopathy. Old right parietal and left cerebellar infarcts. Vascular: Atherosclerotic calcification of the internal carotid arteries at the skull base. No abnormal hyperdensity of the major intracranial arteries or dural venous sinuses. Skull: The visualized skull base, calvarium and extracranial soft tissues are normal. Sinuses/Orbits: No fluid levels or advanced mucosal thickening of the visualized paranasal sinuses. No mastoid or middle ear effusion. The orbits are normal. CT CERVICAL SPINE FINDINGS Alignment: Grade 1 anterolisthesis at C4-5. Facets are aligned. Occipital condyles are normally positioned. Skull base and vertebrae: No acute fracture. Soft tissues and spinal canal: No prevertebral fluid or swelling. No visible canal hematoma. Disc levels: No spinal canal stenosis. Multilevel facet arthrosis, left-greater-than-right. Upper  chest: No pneumothorax, pulmonary nodule or pleural effusion. Other: Normal visualized paraspinal cervical soft tissues. IMPRESSION: 1. No acute intracranial abnormality. 2. 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 3. No acute fracture or static subluxation of the cervical spine. Electronically Signed   By: Deatra Robinson M.D.   On: 06/01/2023 20:09    Assessment/Plan: Venous stasis dermatitis of both lower extremities worsened BLE erythema, noted fever,  thick scaly skin BLE, sensitive to touch, s/p Dermatology eval, Aveeno moisturizer,  on Furosemide, chronic swelling.  Trial of 7 day course of Doxycycline 100mg  bid, observe   Generalized weakness  c/o generalized weakness, afraid of falling, nor foal neurological symptoms, afebrile, declined SNF [facility] for ADLs.  Update CBC/diff, CMP/eGFR, VS q4hr x72 hours 06/04/23 wbc 4.9, Hgb 12.8, plt 232, neutrophils 58.1, Na 140, K 4.2, Bun 36, creat 1.23  CKD (chronic kidney disease) stage 3, GFR 30-59 ml/min (  HCC) Bun/cret 36/1.23 06/04/23  Paroxysmal atrial fibrillation (HCC)  heart rate is in control, on Digoxin. Dig level 1.4 04/26/23  , takes Plavix  Acquired hypothyroidism stable, on Levothyroxine qd. TSH 3.96 12/24/22  BPH (benign prostatic hyperplasia) stable, on Dutasteride 0.5mg  qd  GERD (gastroesophageal reflux disease)  takes Pantoprazole, Hgb 12.8 06/04/23  Impaired memory needs SNF care.     Family/ staff Communication: plan of care reviewed with the patient and charge nurse.   Labs/tests ordered:  CBC/diff, CMP/eGFR  Time spend 40 minutes.

## 2023-06-14 ENCOUNTER — Non-Acute Institutional Stay: Payer: Medicare Other | Admitting: Nurse Practitioner

## 2023-06-14 ENCOUNTER — Encounter: Payer: Self-pay | Admitting: Nurse Practitioner

## 2023-06-14 DIAGNOSIS — N1831 Chronic kidney disease, stage 3a: Secondary | ICD-10-CM | POA: Diagnosis not present

## 2023-06-14 DIAGNOSIS — I209 Angina pectoris, unspecified: Secondary | ICD-10-CM | POA: Diagnosis not present

## 2023-06-14 DIAGNOSIS — R29898 Other symptoms and signs involving the musculoskeletal system: Secondary | ICD-10-CM | POA: Diagnosis not present

## 2023-06-14 DIAGNOSIS — E039 Hypothyroidism, unspecified: Secondary | ICD-10-CM | POA: Diagnosis not present

## 2023-06-14 DIAGNOSIS — I480 Paroxysmal atrial fibrillation: Secondary | ICD-10-CM | POA: Diagnosis not present

## 2023-06-14 DIAGNOSIS — N400 Enlarged prostate without lower urinary tract symptoms: Secondary | ICD-10-CM

## 2023-06-14 DIAGNOSIS — M6281 Muscle weakness (generalized): Secondary | ICD-10-CM | POA: Diagnosis not present

## 2023-06-14 DIAGNOSIS — K219 Gastro-esophageal reflux disease without esophagitis: Secondary | ICD-10-CM | POA: Diagnosis not present

## 2023-06-14 DIAGNOSIS — R2681 Unsteadiness on feet: Secondary | ICD-10-CM | POA: Diagnosis not present

## 2023-06-14 DIAGNOSIS — R195 Other fecal abnormalities: Secondary | ICD-10-CM | POA: Diagnosis not present

## 2023-06-14 DIAGNOSIS — Z9181 History of falling: Secondary | ICD-10-CM | POA: Diagnosis not present

## 2023-06-14 DIAGNOSIS — I872 Venous insufficiency (chronic) (peripheral): Secondary | ICD-10-CM

## 2023-06-14 NOTE — Assessment & Plan Note (Signed)
Venous dermatitis, improved BLE erythema, fever, thick scaly skin BLE, sensitive to touch, s/p Dermatology eval, Aveeno moisturizer, started 7 day course of Doxy 06/03/23             Edema BLE, hold Furosemide until loose stools resolved, chronic swelling.

## 2023-06-14 NOTE — Progress Notes (Unsigned)
Location:  [facility] Room Number: 813-A Place of Service:  ALF [phone]) Provider:  Maveric Debono Cyndie Mull, MD  Patient Care Team: Venita Sheffield, MD as PCP - General (Internal Medicine) Arminda Resides, MD as Consulting Physician (Dermatology) Mikiala Fugett X, NP as Nurse Practitioner (Internal Medicine)  Extended Emergency Contact Information Primary Emergency Contact: Keizer,Bill Address: [address]          [place], [place] [address] [place] Home Phone: [phone] Mobile Phone: [phone] Relation: Brother Secondary Emergency Contact: Isla Pence [place] of [place] Mobile Phone: [phone] Relation: Relative  Code Status:  DNR Goals of care: Advanced Directive information    06/14/2023    2:27 PM  Advanced Directives  Does Patient Have a Medical Advance Directive? Yes  Type of Estate agent of [place];Out of facility DNR (pink MOST or yellow form)  Does patient want to make changes to medical advance directive? No - Patient declined  Copy of Healthcare Power of Attorney in Chart? Yes - validated most recent copy scanned in chart (See row information)  Pre-existing out of facility DNR order (yellow form or pink MOST form) Yellow form placed in chart (order not valid for inpatient use)     Chief Complaint  Patient presents with  . Acute Visit    Nausea, vomiting, diarrhea     HPI:  Pt is a 89 y.o. male seen today for an acute visit for loose stools, denied nausea, vomiting, no noted cough or SOB, denied abd pain/discomfort, he is afebrile.     ED eval 05/30/23 for fall. The patient stated he fell in the bathroom when his legs gave away, yelled help, assisted to get off the floor with assistance. Denied headache, loss of consciousness, chest pain, palpitation, or SOB. Resulted right jaw area contusion.              CT head/cervical spine 06/01/23 showed  1. No acute  intracranial abnormality. 2. 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 3. No acute fracture or static subluxation of the cervical spine.             Venous dermatitis, improved BLE erythema, fever, thick scaly skin BLE, sensitive to touch, s/p Dermatology eval, Aveeno moisturizer, started 7 day course of Doxy 06/03/23             Edema BLE, on Furosemide, chronic swelling.              The right buttock pressure ulcer  is healed with a scar tissue, short lived pain in the area come and goes, positional             Hx of Afib, heart rate is in control, on Digoxin. Dig level 1.1 06/01/23, takes Plavix             Hypothyroidism, stable, on Levothyroxine qd. TSH 3.96 12/24/22             Urinary frequency, stable, on Dutasteride 0.5mg  qd             CKD Bun/cret 36/1.23 06/04/23             Dysphagia, declined ST or MBSS, cough associated with swallowing.              GERD takes Pantoprazole, Hgb 12.8 06/04/23             Hyperlipidemia, takes Pravastatin, LDL 85 12/24/22  CAD/angina pectoris, takes Plavix, Isosorbide, prn NTG             Impaired memory, needs SNF care.  Past Medical History:  Diagnosis Date  . Anginal pain (HCC)    nuclear stress 03/12/13 EPIC  . Balance problem 07/17/2015  . Basal cell carcinoma    left forearm  . BPH (benign prostatic hyperplasia)   . Cataract, nuclear 08/08/2014  . 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 Cervical spondylosis without myelopathy 08/22/2015  . Diverticulosis   . Diverticulosis of colon without hemorrhage 08/22/2015  . Edema 08/18/2015  . Fall 07/17/2015  . Gait disturbance 08/22/2015  . GERD (gastroesophageal reflux disease)    occurs rarely  . History of CVA (cerebrovascular accident) 07/17/2015  . HLD (hyperlipidemia) 07/17/2015  . Hyperlipidemia   . Hypertension   . Inguinal hernia   . Left carotid bruit   . Melanoma (HCC)    left forearm  . Osteopenia   . Paroxysmal atrial  fibrillation (HCC)   . Physical deconditioning 08/22/2015  . Retinal hemorrhage 04/24/2014  . Rosacea blepharoconjunctivitis 11/20/2011  . Stroke (HCC) 3/07   affected balance  . Tubular adenoma of colon 2007  . Weakness 07/17/2015   Past Surgical History:  Procedure Laterality Date  . COLONOSCOPY  2008  . EYE SURGERY Right    cataract extraction with IOL  . INGUINAL HERNIA REPAIR Right 03/15/2013   Procedure: HERNIA REPAIR INGUINAL ADULT;  Surgeon: Adolph Pollack, MD;  Location: [facility];  Service: General;  Laterality: Right;  . INSERTION OF MESH Right 03/15/2013   Procedure: INSERTION OF MESH;  Surgeon: Adolph Pollack, MD;  Location: [facility];  Service: General;  Laterality: Right;  . left forearm melanoma    . PROSTATE BIOPSY    . SKIN SURGERY  08/04/2020  . TONSILLECTOMY  1933    Allergies  Allergen Reactions  . Aspirin   . Egg-Derived Products Swelling    throat    Outpatient Encounter Medications as of 06/14/2023  Medication Sig  . ammonium lactate (LAC-HYDRIN) 12 % lotion Apply 1 application. topically at bedtime.  . cholecalciferol (VITAMIN D) 400 units TABS tablet Take 400 Units by mouth 2 (two) times daily.  . clopidogrel (PLAVIX) 75 MG tablet Take 75 mg by mouth daily. Start PLAVIX from 07/24/15 if no further hematuria is reported by patient and HB > 12.  . digoxin (LANOXIN) 0.125 MG tablet Take 0.125 mg by mouth daily.  Marland Kitchen dutasteride (AVODART) 0.5 MG capsule Take 0.5 mg by mouth daily.   . furosemide (LASIX) 40 MG tablet Take 40 mg by mouth daily.  . isosorbide mononitrate (IMDUR) 30 MG 24 hr tablet Take 0.5 tablets (15 mg total) by mouth daily.  Marland Kitchen levothyroxine (SYNTHROID, LEVOTHROID) 50 MCG tablet Take 50 mcg by mouth daily before breakfast.  . nitroGLYCERIN (NITROSTAT) 0.4 MG SL tablet Place 1 tablet under the tongue as needed.  . pantoprazole (PROTONIX) 40 MG tablet Take 1 tablet (40 mg total) by mouth daily.  . polyethylene glycol (MIRALAX / GLYCOLAX) 17 g  packet Take 17 g by mouth daily as needed.  . potassium chloride SA (KLOR-CON M) 20 MEQ tablet Take 20 mEq by mouth daily.  . sodium fluoride (PREVIDENT 5000 PLUS) 1.1 % CREA dental cream Place 1 Application onto teeth every evening.  . nystatin (MYCOSTATIN/NYSTOP) powder Apply 1 Application topically 3 (three) times daily. (Patient not taking: Reported on 06/14/2023)  . pravastatin (PRAVACHOL) 20 MG tablet Take 1 tablet by mouth  at bedtime.   No facility-administered encounter medications on file as of 06/14/2023.    Review of Systems  Constitutional:  Negative for activity change, appetite change and fever.  HENT:  Positive for hearing loss and trouble swallowing. Negative for congestion.        Cough while eating.   Eyes:  Negative for visual disturbance.  Respiratory:  Negative for cough, chest tightness and shortness of breath.   Cardiovascular:  Positive for leg swelling. Negative for chest pain and palpitations.  Gastrointestinal:  Positive for diarrhea. Negative for abdominal pain, blood in stool, nausea and vomiting.  Genitourinary:  Positive for frequency. Negative for dysuria and urgency.  Musculoskeletal:  Positive for arthralgias and gait problem.  Skin:        Right buttock scar tissue from healed previous pressure ulcer. Thick scaly skin, erythematous swelling BLE, noted warmth, better R jaw/neck/under the right ear contusion from the fall, no s/s of infection  Neurological:  Negative for speech difficulty, weakness and headaches.  Psychiatric/Behavioral:  Negative for behavioral problems and sleep disturbance. The patient is not nervous/anxious.     Immunization History  Administered Date(s) Administered  . Moderna Covid-19 Vaccine Bivalent Booster 63yrs & up 11/11/2021  . Moderna Sars-Covid-2 Vaccination 07/06/2019, 08/02/2019, 05/06/2020, 11/25/2020  . PNEUMOCOCCAL CONJUGATE-20 12/20/2022  . PPD Test 07/21/2015, 08/04/2015  . Research officer, trade [employer]  16yrs & up 04/29/2022  . Tdap 07/16/2015  . Unspecified SARS-COV-2 Vaccination 03/17/2021  . Zoster Recombinant(Shingrix) 12/04/2013   Pertinent  Health Maintenance Due  Topic Date Due  . INFLUENZA VACCINE  Discontinued      07/04/2020   11:15 AM 08/05/2020    2:48 PM 09/12/2020   10:19 AM 04/16/2022    2:46 PM 12/14/2022    1:05 PM  Fall Risk  Falls in the past year? 0 0 0 0 0  Was there an injury with Fall?  0  0 0  Fall Risk Category Calculator  0  0 0  Fall Risk Category (Retired)  Low  Low   (RETIRED) Patient Fall Risk Level  Low fall risk  Low fall risk   Patient at Risk for Falls Due to    No Fall Risks No Fall Risks  Fall risk Follow up    Falls evaluation completed Falls evaluation completed   Functional Status Survey:    Vitals:   06/14/23 1308  BP: 138/72  Pulse: 90  Resp: 18  Temp: 98.2 F (36.8 C)  SpO2: 98%  Weight: 139 lb (63 kg)  Height: 6\' 1"  (1.854 m)   Body mass index is 18.34 kg/m. Physical Exam Vitals and nursing note reviewed.  Constitutional:      Appearance: Normal appearance.  HENT:     Head: Normocephalic and atraumatic.     Mouth/Throat:     Mouth: Mucous membranes are moist.  Eyes:     Extraocular Movements: Extraocular movements intact.     Conjunctiva/sclera: Conjunctivae normal.     Pupils: Pupils are equal, round, and reactive to light.  Cardiovascular:     Rate and Rhythm: Normal rate. Rhythm irregular.     Heart sounds: No murmur heard. Pulmonary:     Effort: Pulmonary effort is normal.     Breath sounds: Rales present.     Comments: Bibasilar rales, L>R Abdominal:     Palpations: Abdomen is soft.     Tenderness: There is no abdominal tenderness.     Comments: Irregular schedule BM, but no constipation.  Musculoskeletal:     Cervical back: Normal range of motion and neck supple.     Right lower leg: Edema present.     Left lower leg: Edema present.     Comments: 1+ edema BLE  Skin:    General: Skin is warm and dry.      Findings: Bruising and erythema present.     Comments: Chronic venous insufficiency skin changes. Thick scaly skin, improved erythematous swelling BLE, warmth, and sensitive to touch Right buttock scar tissue from healed previous pressure ulcer. Lateral left lower leg skin lesion.   Overall dry skin appearance.  Right jaw/neck/under the right ear contusion from the fall, able to open and close mouth/chewing motion w/o pain, no ear pain when tragus pressed or auricle moved.   Neurological:     General: No focal deficit present.     Mental Status: He is alert and oriented to person, place, and time. Mental status is at baseline.     Gait: Gait abnormal.  Psychiatric:        Mood and Affect: Mood normal.        Behavior: Behavior normal.        Thought Content: Thought content normal.        Judgment: Judgment normal.    Labs reviewed: Recent Labs    12/24/22 0000 06/01/23 1830 06/04/23 0000  NA 141 137 140  K 4.1 4.1 4.2  CL 100 100 101  CO2 34* 29 33*  GLUCOSE  --  138*  --   BUN 30* 37* 36*  CREATININE 1.2 1.43* 1.2  CALCIUM 9.3 9.2 9.2   Recent Labs    12/24/22 0000 06/01/23 1830 06/04/23 0000  AST 13* 20 24  ALT 7* 15 14  ALKPHOS 76 59 69  BILITOT  --  0.6  --   PROT  --  6.4*  --   ALBUMIN 3.7 3.2* 3.5   Recent Labs    12/24/22 0000 06/01/23 1830 06/04/23 0000  WBC 5.9 6.5 4.9  NEUTROABS 2,985.00 4.4 2,847.00  HGB 13.5 13.0 12.8*  HCT 41 40.5 38*  MCV  --  92.5  --   PLT 250 242 232   Lab Results  Component Value Date   TSH 3.96 12/24/2022   No results found for: "HGBA1C" Lab Results  Component Value Date   CHOL 147 12/24/2022   HDL 42 12/24/2022   LDLCALC 85 12/24/2022   TRIG 100 12/24/2022   CHOLHDL 2.6 07/10/2019    Significant Diagnostic Results in last 30 days:  CT HEAD WO CONTRAST Result Date: 06/01/2023 CLINICAL DATA:  Trauma.  Fall. EXAM: CT HEAD WITHOUT CONTRAST CT CERVICAL SPINE WITHOUT CONTRAST TECHNIQUE: Multidetector CT imaging  of the head and cervical spine was performed following the standard protocol without intravenous contrast. Multiplanar CT image reconstructions of the cervical spine were also generated. RADIATION DOSE REDUCTION: This exam was performed according to the departmental dose-optimization program which includes automated exposure control, adjustment of the mA and/or kV according to patient size and/or use of iterative reconstruction technique. COMPARISON:  None Available. FINDINGS: CT HEAD FINDINGS Brain: There is no mass, hemorrhage or extra-axial collection. There is generalized atrophy without lobar predilection. There is hypoattenuation of the periventricular white matter, most commonly indicating chronic ischemic microangiopathy. Old right parietal and left cerebellar infarcts. Vascular: Atherosclerotic calcification of the internal carotid arteries at the skull base. No abnormal hyperdensity of the major intracranial arteries or dural venous sinuses. Skull: The visualized skull base,  calvarium and extracranial soft tissues are normal. Sinuses/Orbits: No fluid levels or advanced mucosal thickening of the visualized paranasal sinuses. No mastoid or middle ear effusion. The orbits are normal. CT CERVICAL SPINE FINDINGS Alignment: Grade 1 anterolisthesis at C4-5. Facets are aligned. Occipital condyles are normally positioned. Skull base and vertebrae: No acute fracture. Soft tissues and spinal canal: No prevertebral fluid or swelling. No visible canal hematoma. Disc levels: No spinal canal stenosis. Multilevel facet arthrosis, left-greater-than-right. Upper chest: No pneumothorax, pulmonary nodule or pleural effusion. Other: Normal visualized paraspinal cervical soft tissues. IMPRESSION: 1. No acute intracranial abnormality. 2. 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 3. No acute fracture or static subluxation of the cervical spine. Electronically Signed   By: Deatra Robinson  M.D.   On: 06/01/2023 20:09   CT CERVICAL SPINE WO CONTRAST Result Date: 06/01/2023 CLINICAL DATA:  Trauma.  Fall. EXAM: CT HEAD WITHOUT CONTRAST CT CERVICAL SPINE WITHOUT CONTRAST TECHNIQUE: Multidetector CT imaging of the head and cervical spine was performed following the standard protocol without intravenous contrast. Multiplanar CT image reconstructions of the cervical spine were also generated. RADIATION DOSE REDUCTION: This exam was performed according to the departmental dose-optimization program which includes automated exposure control, adjustment of the mA and/or kV according to patient size and/or use of iterative reconstruction technique. COMPARISON:  None Available. FINDINGS: CT HEAD FINDINGS Brain: There is no mass, hemorrhage or extra-axial collection. There is generalized atrophy without lobar predilection. There is hypoattenuation of the periventricular white matter, most commonly indicating chronic ischemic microangiopathy. Old right parietal and left cerebellar infarcts. Vascular: Atherosclerotic calcification of the internal carotid arteries at the skull base. No abnormal hyperdensity of the major intracranial arteries or dural venous sinuses. Skull: The visualized skull base, calvarium and extracranial soft tissues are normal. Sinuses/Orbits: No fluid levels or advanced mucosal thickening of the visualized paranasal sinuses. No mastoid or middle ear effusion. The orbits are normal. CT CERVICAL SPINE FINDINGS Alignment: Grade 1 anterolisthesis at C4-5. Facets are aligned. Occipital condyles are normally positioned. Skull base and vertebrae: No acute fracture. Soft tissues and spinal canal: No prevertebral fluid or swelling. No visible canal hematoma. Disc levels: No spinal canal stenosis. Multilevel facet arthrosis, left-greater-than-right. Upper chest: No pneumothorax, pulmonary nodule or pleural effusion. Other: Normal visualized paraspinal cervical soft tissues. IMPRESSION: 1. No acute  intracranial abnormality. 2. 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 3. No acute fracture or static subluxation of the cervical spine. Electronically Signed   By: Deatra Robinson M.D.   On: 06/01/2023 20:09    Assessment/Plan Loose stools  loose stools, denied nausea, vomiting, no noted cough or SOB, denied abd pain/discomfort, he is afebrile.  Prn Zofran, imodium available to him VS every day x72hrs Hold Furosemide/Kcl until loose stools resolved May check C-diff toxin A/B x 3 if diarrhea persists.   Venous stasis dermatitis of both lower extremities Venous dermatitis, improved BLE erythema, fever, thick scaly skin BLE, sensitive to touch, s/p Dermatology eval, Aveeno moisturizer, started 7 day course of Doxy 06/03/23             Edema BLE, hold Furosemide until loose stools resolved, chronic swelling.   Paroxysmal atrial fibrillation (HCC) heart rate is in control, on Digoxin. Dig level 1.1 06/01/23 , takes Plavix  Acquired hypothyroidism  stable, on Levothyroxine qd. TSH 3.96 12/24/22  BPH (benign prostatic hyperplasia) stable, on Dutasteride 0.5mg  qd  CKD (chronic kidney disease) stage 3, GFR 30-59 ml/min (  HCC) Bun/cret 36/1.23 06/04/23  GERD (gastroesophageal reflux disease)  takes Pantoprazole, Hgb 12.8 06/04/23     Family/ staff Communication: plan of care reviewed with the patient and charge nurse.   Labs/tests ordered: none  Time spend 40 minutes.

## 2023-06-14 NOTE — Assessment & Plan Note (Signed)
stable, on Dutasteride 0.5mg qd 

## 2023-06-14 NOTE — Assessment & Plan Note (Signed)
 Bun/cret 36/1.23 06/04/23

## 2023-06-14 NOTE — Assessment & Plan Note (Signed)
loose stools, denied nausea, vomiting, no noted cough or SOB, denied abd pain/discomfort, he is afebrile.  Prn Zofran, imodium available to him VS every day x72hrs Hold Furosemide/Kcl until loose stools resolved May check C-diff toxin A/B x 3 if diarrhea persists.

## 2023-06-14 NOTE — Assessment & Plan Note (Signed)
No chest pain since last seen, takes Plavix, Isosorbide, prn NTG

## 2023-06-14 NOTE — Assessment & Plan Note (Signed)
heart rate is in control, on Digoxin. Dig level 1.1 06/01/23 , takes Plavix

## 2023-06-14 NOTE — Assessment & Plan Note (Signed)
stable, on Levothyroxine qd. TSH 3.96 12/24/22

## 2023-06-14 NOTE — Assessment & Plan Note (Signed)
 takes Pantoprazole, Hgb 12.8 06/04/23

## 2023-06-15 DIAGNOSIS — M6281 Muscle weakness (generalized): Secondary | ICD-10-CM | POA: Diagnosis not present

## 2023-06-15 DIAGNOSIS — R29898 Other symptoms and signs involving the musculoskeletal system: Secondary | ICD-10-CM | POA: Diagnosis not present

## 2023-06-15 DIAGNOSIS — Z9181 History of falling: Secondary | ICD-10-CM | POA: Diagnosis not present

## 2023-06-15 DIAGNOSIS — R2681 Unsteadiness on feet: Secondary | ICD-10-CM | POA: Diagnosis not present

## 2023-06-20 DIAGNOSIS — R2681 Unsteadiness on feet: Secondary | ICD-10-CM | POA: Diagnosis not present

## 2023-06-20 DIAGNOSIS — M6281 Muscle weakness (generalized): Secondary | ICD-10-CM | POA: Diagnosis not present

## 2023-06-20 DIAGNOSIS — Z9181 History of falling: Secondary | ICD-10-CM | POA: Diagnosis not present

## 2023-06-20 DIAGNOSIS — R29898 Other symptoms and signs involving the musculoskeletal system: Secondary | ICD-10-CM | POA: Diagnosis not present

## 2023-06-21 DIAGNOSIS — R29898 Other symptoms and signs involving the musculoskeletal system: Secondary | ICD-10-CM | POA: Diagnosis not present

## 2023-06-21 DIAGNOSIS — R2681 Unsteadiness on feet: Secondary | ICD-10-CM | POA: Diagnosis not present

## 2023-06-21 DIAGNOSIS — Z9181 History of falling: Secondary | ICD-10-CM | POA: Diagnosis not present

## 2023-06-21 DIAGNOSIS — M6281 Muscle weakness (generalized): Secondary | ICD-10-CM | POA: Diagnosis not present

## 2023-06-21 DIAGNOSIS — R531 Weakness: Secondary | ICD-10-CM | POA: Diagnosis not present

## 2023-06-21 LAB — BASIC METABOLIC PANEL
BUN: 38 — AB (ref 4–21)
CO2: 36 — AB (ref 13–22)
Chloride: 99 (ref 99–108)
Creatinine: 1.4 — AB (ref 0.6–1.3)
Glucose: 90
Potassium: 4.2 meq/L (ref 3.5–5.1)
Sodium: 139 (ref 137–147)

## 2023-06-21 LAB — CBC AND DIFFERENTIAL
HCT: 38 — AB (ref 41–53)
Hemoglobin: 12.3 — AB (ref 13.5–17.5)
Platelets: 223 10*3/uL (ref 150–400)
WBC: 3.9

## 2023-06-21 LAB — CBC: RBC: 4.12 (ref 3.87–5.11)

## 2023-06-21 LAB — COMPREHENSIVE METABOLIC PANEL
Calcium: 8.8 (ref 8.7–10.7)
eGFR: 48

## 2023-06-27 DIAGNOSIS — M6281 Muscle weakness (generalized): Secondary | ICD-10-CM | POA: Diagnosis not present

## 2023-06-27 DIAGNOSIS — R29898 Other symptoms and signs involving the musculoskeletal system: Secondary | ICD-10-CM | POA: Diagnosis not present

## 2023-06-27 DIAGNOSIS — R2681 Unsteadiness on feet: Secondary | ICD-10-CM | POA: Diagnosis not present

## 2023-06-27 DIAGNOSIS — Z9181 History of falling: Secondary | ICD-10-CM | POA: Diagnosis not present

## 2023-06-30 ENCOUNTER — Encounter: Payer: Self-pay | Admitting: Nurse Practitioner

## 2023-06-30 ENCOUNTER — Non-Acute Institutional Stay: Payer: Medicare Other | Admitting: Nurse Practitioner

## 2023-06-30 DIAGNOSIS — E785 Hyperlipidemia, unspecified: Secondary | ICD-10-CM | POA: Diagnosis not present

## 2023-06-30 DIAGNOSIS — I480 Paroxysmal atrial fibrillation: Secondary | ICD-10-CM

## 2023-06-30 DIAGNOSIS — R1312 Dysphagia, oropharyngeal phase: Secondary | ICD-10-CM | POA: Diagnosis not present

## 2023-06-30 DIAGNOSIS — N400 Enlarged prostate without lower urinary tract symptoms: Secondary | ICD-10-CM | POA: Diagnosis not present

## 2023-06-30 DIAGNOSIS — N1831 Chronic kidney disease, stage 3a: Secondary | ICD-10-CM | POA: Diagnosis not present

## 2023-06-30 DIAGNOSIS — Z66 Do not resuscitate: Secondary | ICD-10-CM

## 2023-06-30 DIAGNOSIS — R413 Other amnesia: Secondary | ICD-10-CM

## 2023-06-30 DIAGNOSIS — R2681 Unsteadiness on feet: Secondary | ICD-10-CM | POA: Diagnosis not present

## 2023-06-30 DIAGNOSIS — I872 Venous insufficiency (chronic) (peripheral): Secondary | ICD-10-CM | POA: Diagnosis not present

## 2023-06-30 DIAGNOSIS — I209 Angina pectoris, unspecified: Secondary | ICD-10-CM

## 2023-06-30 DIAGNOSIS — E039 Hypothyroidism, unspecified: Secondary | ICD-10-CM

## 2023-06-30 DIAGNOSIS — K219 Gastro-esophageal reflux disease without esophagitis: Secondary | ICD-10-CM | POA: Diagnosis not present

## 2023-06-30 DIAGNOSIS — I10 Essential (primary) hypertension: Secondary | ICD-10-CM | POA: Diagnosis not present

## 2023-06-30 DIAGNOSIS — R41841 Cognitive communication deficit: Secondary | ICD-10-CM | POA: Diagnosis not present

## 2023-06-30 DIAGNOSIS — R2689 Other abnormalities of gait and mobility: Secondary | ICD-10-CM | POA: Diagnosis not present

## 2023-06-30 DIAGNOSIS — M6281 Muscle weakness (generalized): Secondary | ICD-10-CM | POA: Diagnosis not present

## 2023-06-30 NOTE — Assessment & Plan Note (Addendum)
 Improved BLE erythema, fever, thick scaly skin BLE, sensitive to touch, s/p Dermatology eval, Aveeno moisturizer, open areas R+L shins healing nicely The left lateral 2nd toe blood filled blister, most likely from trauma, no s/s of infection, should heal on Furosemide, chronic swelling.

## 2023-06-30 NOTE — Assessment & Plan Note (Signed)
 No chest pain reported since last seen, takes Plavix, Isosorbide, prn NTG

## 2023-06-30 NOTE — Assessment & Plan Note (Signed)
stable, on Levothyroxine qd. TSH 3.96 12/24/22

## 2023-06-30 NOTE — Assessment & Plan Note (Signed)
 takes Pravastatin, LDL 85 12/24/22

## 2023-06-30 NOTE — Assessment & Plan Note (Signed)
 heart rate is in control, on Digoxin. Dig level 1.1 06/01/23 , takes Plavix

## 2023-06-30 NOTE — Assessment & Plan Note (Signed)
 stable, on Dutasteride 0.5mg  qd

## 2023-06-30 NOTE — Progress Notes (Addendum)
 Location:  [facility] Room Number: 813-A Place of Service:  ALF [phone]) Provider:  Tyannah Sane OLEGARIO CARROLYN Sherlynn Jackalyn, MD  Patient Care Team: Sherlynn Jackalyn, MD as PCP - General (Internal Medicine) Joshua Sieving, MD as Consulting Physician (Dermatology) Leilany Digeronimo X, NP as Nurse Practitioner (Internal Medicine)  Extended Emergency Contact Information Primary Emergency Contact: Buice,Bill Address: [address]          [place], [place] [address] [place] Home Phone: [phone] Mobile Phone: [phone] Relation: Brother Secondary Emergency Contact: Grammatico,Barbara  [place] Phone: [phone] Relation: Relative  Code Status:  DNR Goals of care: Advanced Directive information    06/30/2023    2:52 PM  Advanced Directives  Does Patient Have a Medical Advance Directive? Yes  Type of Estate Agent of [place];Out of facility DNR (pink MOST or yellow form)  Does patient want to make changes to medical advance directive? No - Patient declined  Copy of Healthcare Power of Attorney in Chart? Yes - validated most recent copy scanned in chart (See row information)  Pre-existing out of facility DNR order (yellow form or pink MOST form) Yellow form placed in chart (order not valid for inpatient use)     Chief Complaint  Patient presents with   Medical Management of Chronic Issues    Routine visit and discuss covid and shingles vaccines.    HPI:  Pt is a 89 y.o. male seen today for medical management of chronic diseases.     ED eval 05/30/23 for fall. The patient stated he fell in the bathroom when his legs gave away, yelled help, assisted to get off the floor with assistance. Denied headache, loss of consciousness, chest pain, palpitation, or SOB. Resulted right jaw area contusion.              CT head/cervical spine 06/01/23 showed  1. No acute intracranial abnormality. 2. 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 3. No acute fracture or static subluxation of the cervical spine.             Venous dermatitis, improved BLE erythema, fever, thick scaly skin BLE, sensitive to touch, s/p Dermatology eval, Aveeno moisturizer, open areas R+L shins healing nicely             Edema BLE, on Furosemide, chronic swelling.              The right buttock pressure ulcer  is healed with a scar tissue, short lived pain in the area come and goes, positional             Hx of Afib, heart rate is in control, on Digoxin . Dig level 1.1 06/01/23, takes Plavix              Hypothyroidism, stable, on Levothyroxine 50mcg qd. TSH 3.96 12/24/22             Urinary frequency, stable, on Dutasteride  0.5mg  qd             CKD Bun/creat 38/1.4 06/21/23             Dysphagia, declined ST or MBSS, cough associated with swallowing.              GERD takes Pantoprazole , Hgb 12.3 06/21/23             Hyperlipidemia, takes Pravastatin , LDL 85 12/24/22             CAD/angina pectoris,  takes Plavix , Isosorbide , prn NTG             Impaired memory, needs SNF care.   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   Cervical spondylosis without myelopathy 08/22/2015   Diverticulosis    Diverticulosis of colon without hemorrhage 08/22/2015   Edema 08/18/2015   Fall 07/17/2015   Gait disturbance 08/22/2015   GERD (gastroesophageal reflux disease)    occurs rarely   History of CVA (cerebrovascular accident) 07/17/2015   HLD (hyperlipidemia) 07/17/2015   Hyperlipidemia    Hypertension    Inguinal hernia    Left carotid bruit    Melanoma (HCC)    left forearm   Osteopenia    Paroxysmal atrial fibrillation (HCC)    Physical deconditioning 08/22/2015   Retinal  hemorrhage 04/24/2014   Rosacea blepharoconjunctivitis 11/20/2011   Stroke (HCC) 3/07   affected balance   Tubular adenoma of colon 2007   Weakness 07/17/2015   Past Surgical History:  Procedure Laterality Date   COLONOSCOPY  2008   EYE SURGERY Right    cataract extraction with IOL   INGUINAL HERNIA REPAIR Right 03/15/2013   Procedure: HERNIA REPAIR INGUINAL ADULT;  Surgeon: Krystal JINNY Russell, MD;  Location: [facility];  Service: General;  Laterality: Right;   INSERTION OF MESH Right 03/15/2013   Procedure: INSERTION OF MESH;  Surgeon: Krystal JINNY Russell, MD;  Location: [facility];  Service: General;  Laterality: Right;   left forearm melanoma     PROSTATE BIOPSY     SKIN SURGERY  08/04/2020   TONSILLECTOMY  1933    Allergies  Allergen Reactions   Aspirin    Egg-Derived Products Swelling    throat    Outpatient Encounter Medications as of 06/30/2023  Medication Sig   ammonium lactate (LAC-HYDRIN) 12 % lotion Apply 1 application. topically at bedtime.   cholecalciferol (VITAMIN D ) 400 units TABS tablet Take 400 Units by mouth 2 (two) times daily.   clopidogrel  (PLAVIX ) 75 MG tablet Take 75 mg by mouth daily. Start PLAVIX  from 07/24/15 if no further hematuria is reported by patient and HB > 12.   digoxin  (LANOXIN ) 0.125 MG tablet Take 0.125 mg by mouth daily.   dutasteride  (AVODART ) 0.5 MG capsule Take 0.5 mg by mouth daily.    furosemide (LASIX) 40 MG tablet Take 40 mg by mouth daily.   isosorbide  mononitrate (IMDUR ) 30 MG 24 hr tablet Take 0.5 tablets (15 mg total) by mouth daily.   levothyroxine (SYNTHROID, LEVOTHROID) 50 MCG tablet Take 50 mcg by mouth daily before breakfast.   nitroGLYCERIN  (NITROSTAT ) 0.4 MG SL tablet Place 1 tablet under the tongue as needed.   pantoprazole  (PROTONIX ) 40 MG tablet Take 1 tablet (40 mg total) by mouth daily.   polyethylene glycol (MIRALAX  / GLYCOLAX ) 17 g packet Take 17 g by mouth daily as needed.   potassium chloride  SA (KLOR-CON  M) 20 MEQ tablet Take  20 mEq by mouth daily.   pravastatin  (PRAVACHOL ) 20 MG tablet Take 1 tablet by mouth at bedtime.   sodium fluoride (PREVIDENT 5000 PLUS) 1.1 % CREA dental cream Place 1 Application onto teeth every evening.   nystatin  (MYCOSTATIN /NYSTOP ) powder Apply 1 Application topically 3 (three) times daily. (Patient not taking: Reported on  06/02/2023)   No facility-administered encounter medications on file as of 06/30/2023.    Review of Systems  Constitutional:  Negative for activity change, appetite change and fever.  HENT:  Positive for hearing loss and trouble swallowing. Negative for congestion.        Cough while eating.   Eyes:  Negative for visual disturbance.  Respiratory:  Negative for cough, chest tightness and shortness of breath.   Cardiovascular:  Positive for leg swelling. Negative for chest pain and palpitations.  Gastrointestinal:  Negative for abdominal pain, constipation and diarrhea.  Genitourinary:  Positive for frequency. Negative for dysuria and urgency.  Musculoskeletal:  Positive for arthralgias and gait problem.  Skin:        Right buttock scar tissue from healed previous pressure ulcer. Improved thick scaly skin, erythema, warmth, swelling BLE. R+L shin open areas healing nicely.   Neurological:  Negative for speech difficulty, weakness and headaches.  Psychiatric/Behavioral:  Negative for behavioral problems and sleep disturbance. The patient is not nervous/anxious.     Immunization History  Administered Date(s) Administered   Moderna Covid-19 Vaccine  Bivalent Booster 57yrs & up 11/11/2021   Moderna Sars-Covid-2 Vaccination 07/06/2019, 08/02/2019, 05/06/2020, 11/25/2020   PNEUMOCOCCAL CONJUGATE-20 12/20/2022   PPD Test 07/21/2015, 08/04/2015   Pfizer Covid-19 Vaccine Bivalent Booster 71yrs & up 04/29/2022   Tdap 07/16/2015   Unspecified SARS-COV-2 Vaccination 03/17/2021   Zoster Recombinant(Shingrix) 12/04/2013   Pertinent  Health Maintenance Due  Topic Date Due    INFLUENZA VACCINE  Discontinued      07/04/2020   11:15 AM 08/05/2020    2:48 PM 09/12/2020   10:19 AM 04/16/2022    2:46 PM 12/14/2022    1:05 PM  Fall Risk  Falls in the past year? 0 0 0 0 0  Was there an injury with Fall?  0  0 0  Fall Risk Category Calculator  0  0 0  Fall Risk Category (Retired)  Low  Low   (RETIRED) Patient Fall Risk Level  Low fall risk  Low fall risk   Patient at Risk for Falls Due to    No Fall Risks No Fall Risks  Fall risk Follow up    Falls evaluation completed Falls evaluation completed   Functional Status Survey:    Vitals:   06/30/23 1448 07/01/23 1149  BP: (!) 118/55 (!) 118/55  Pulse: 65   Resp: 18   Temp: 97.6 F (36.4 C)   SpO2: 95%   Weight: 137 lb 3.2 oz (62.2 kg)   Height: 6' 1 (1.854 m)    Body mass index is 18.1 kg/m. Physical Exam Vitals and nursing note reviewed.  Constitutional:      Appearance: Normal appearance.  HENT:     Head: Normocephalic and atraumatic.     Mouth/Throat:     Mouth: Mucous membranes are moist.  Eyes:     Extraocular Movements: Extraocular movements intact.     Conjunctiva/sclera: Conjunctivae normal.     Pupils: Pupils are equal, round, and reactive to light.  Cardiovascular:     Rate and Rhythm: Normal rate. Rhythm irregular.     Heart sounds: No murmur heard. Pulmonary:     Effort: Pulmonary effort is normal.     Breath sounds: Rales present.     Comments: Bibasilar rales, L>R Abdominal:     Palpations: Abdomen is soft.     Tenderness: There is no abdominal tenderness.     Comments: Irregular schedule BM, but no constipation.  Musculoskeletal:     Cervical back: Normal range of motion and neck supple.     Right lower leg: Edema present.     Left lower leg: Edema present.     Comments: 1+ edema BLE  Skin:    General: Skin is warm and dry.     Findings: Erythema present.     Comments: Chronic venous insufficiency skin changes.  Right buttock scar tissue from healed previous pressure ulcer.   Improved thick scaly skin, erythema, warmth, swelling BLE. R+L shin open areas healing nicely.  Neurological:     General: No focal deficit present.     Mental Status: He is alert and oriented to person, place, and time. Mental status is at baseline.     Gait: Gait abnormal.  Psychiatric:        Mood and Affect: Mood normal.        Behavior: Behavior normal.        Thought Content: Thought content normal.        Judgment: Judgment normal.     Labs reviewed: Recent Labs    06/01/23 1830 06/04/23 0000 06/21/23 0000  NA 137 140 139  K 4.1 4.2 4.2  CL 100 101 99  CO2 29 33* 36*  GLUCOSE 138*  --   --   BUN 37* 36* 38*  CREATININE 1.43* 1.2 1.4*  CALCIUM 9.2 9.2 8.8   Recent Labs    12/24/22 0000 06/01/23 1830 06/04/23 0000  AST 13* 20 24  ALT 7* 15 14  ALKPHOS 76 59 69  BILITOT  --  0.6  --   PROT  --  6.4*  --   ALBUMIN 3.7 3.2* 3.5   Recent Labs    12/24/22 0000 06/01/23 1830 06/04/23 0000 06/21/23 0000  WBC 5.9 6.5 4.9 3.9  NEUTROABS 2,985.00 4.4 2,847.00  --   HGB 13.5 13.0 12.8* 12.3*  HCT 41 40.5 38* 38*  MCV  --  92.5  --   --   PLT 250 242 232 223   Lab Results  Component Value Date   TSH 3.96 12/24/2022   No results found for: HGBA1C Lab Results  Component Value Date   CHOL 147 12/24/2022   HDL 42 12/24/2022   LDLCALC 85 12/24/2022   TRIG 100 12/24/2022   CHOLHDL 2.6 07/10/2019    Significant Diagnostic Results in last 30 days:  CT HEAD WO CONTRAST Result Date: 06/01/2023 CLINICAL DATA:  Trauma.  Fall. EXAM: CT HEAD WITHOUT CONTRAST CT CERVICAL SPINE WITHOUT CONTRAST TECHNIQUE: Multidetector CT imaging of the head and cervical spine was performed following the standard protocol without intravenous contrast. Multiplanar CT image reconstructions of the cervical spine were also generated. RADIATION DOSE REDUCTION: This exam was performed according to the departmental dose-optimization program which includes automated exposure control,  adjustment of the mA and/or kV according to patient size and/or use of iterative reconstruction technique. COMPARISON:  None Available. FINDINGS: CT HEAD FINDINGS Brain: There is no mass, hemorrhage or extra-axial collection. There is generalized atrophy without lobar predilection. There is hypoattenuation of the periventricular white matter, most commonly indicating chronic ischemic microangiopathy. Old right parietal and left cerebellar infarcts. Vascular: Atherosclerotic calcification of the internal carotid arteries at the skull base. No abnormal hyperdensity of the major intracranial arteries or dural venous sinuses. Skull: The visualized skull base, calvarium and extracranial soft tissues are normal. Sinuses/Orbits: No fluid levels or advanced mucosal thickening of the visualized paranasal sinuses. No mastoid or middle ear effusion.  The orbits are normal. CT CERVICAL SPINE FINDINGS Alignment: Grade 1 anterolisthesis at C4-5. Facets are aligned. Occipital condyles are normally positioned. Skull base and vertebrae: No acute fracture. Soft tissues and spinal canal: No prevertebral fluid or swelling. No visible canal hematoma. Disc levels: No spinal canal stenosis. Multilevel facet arthrosis, left-greater-than-right. Upper chest: No pneumothorax, pulmonary nodule or pleural effusion. Other: Normal visualized paraspinal cervical soft tissues. IMPRESSION: 1. No acute intracranial abnormality. 2. 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 3. No acute fracture or static subluxation of the cervical spine. Electronically Signed   By: Franky Stanford M.D.   On: 06/01/2023 20:09   CT CERVICAL SPINE WO CONTRAST Result Date: 06/01/2023 CLINICAL DATA:  Trauma.  Fall. EXAM: CT HEAD WITHOUT CONTRAST CT CERVICAL SPINE WITHOUT CONTRAST TECHNIQUE: Multidetector CT imaging of the head and cervical spine was performed following the standard protocol without intravenous contrast.  Multiplanar CT image reconstructions of the cervical spine were also generated. RADIATION DOSE REDUCTION: This exam was performed according to the departmental dose-optimization program which includes automated exposure control, adjustment of the mA and/or kV according to patient size and/or use of iterative reconstruction technique. COMPARISON:  None Available. FINDINGS: CT HEAD FINDINGS Brain: There is no mass, hemorrhage or extra-axial collection. There is generalized atrophy without lobar predilection. There is hypoattenuation of the periventricular white matter, most commonly indicating chronic ischemic microangiopathy. Old right parietal and left cerebellar infarcts. Vascular: Atherosclerotic calcification of the internal carotid arteries at the skull base. No abnormal hyperdensity of the major intracranial arteries or dural venous sinuses. Skull: The visualized skull base, calvarium and extracranial soft tissues are normal. Sinuses/Orbits: No fluid levels or advanced mucosal thickening of the visualized paranasal sinuses. No mastoid or middle ear effusion. The orbits are normal. CT CERVICAL SPINE FINDINGS Alignment: Grade 1 anterolisthesis at C4-5. Facets are aligned. Occipital condyles are normally positioned. Skull base and vertebrae: No acute fracture. Soft tissues and spinal canal: No prevertebral fluid or swelling. No visible canal hematoma. Disc levels: No spinal canal stenosis. Multilevel facet arthrosis, left-greater-than-right. Upper chest: No pneumothorax, pulmonary nodule or pleural effusion. Other: Normal visualized paraspinal cervical soft tissues. IMPRESSION: 1. No acute intracranial abnormality. 2. 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 3. No acute fracture or static subluxation of the cervical spine. Electronically Signed   By: Franky Stanford M.D.   On: 06/01/2023 20:09    Assessment/Plan Venous stasis dermatitis of both lower  extremities Improved BLE erythema, fever, thick scaly skin BLE, sensitive to touch, s/p Dermatology eval, Aveeno moisturizer, open areas R+L shins healing nicely The left lateral 2nd toe blood filled blister, most likely from trauma, no s/s of infection, should heal on Furosemide, chronic swelling.   Paroxysmal atrial fibrillation (HCC) heart rate is in control, on Digoxin . Dig level 1.1 06/01/23, takes Plavix   Acquired hypothyroidism stable, on Levothyroxine 50mcg qd. TSH 3.96 12/24/22  BPH (benign prostatic hyperplasia)  stable, on Dutasteride  0.5mg  qd  CKD (chronic kidney disease) stage 3, GFR 30-59 ml/min (HCC) Bun/creat 38/1.4 06/21/23  GERD (gastroesophageal reflux disease) Stable, takes Pantoprazole , Hgb 12.3 06/21/23  Hyperlipidemia LDL goal <70  takes Pravastatin , LDL 85 12/24/22  Angina pectoris (HCC) No chest pain reported since last seen, takes Plavix , Isosorbide , prn NTG  Impaired memory No behavioral issues, needs SNF for care  HTN (hypertension) Runs low, takes Furosemide, Isosorbide  15mg  every day. Close monitoring.       Family/ staff Communication: plan of care reviewed  with the patient and charge nurse.   Labs/tests ordered:  none  Time spend 40 minutes.

## 2023-06-30 NOTE — Assessment & Plan Note (Signed)
 Bun/creat 38/1.4 06/21/23

## 2023-06-30 NOTE — Assessment & Plan Note (Signed)
 Stable, takes Pantoprazole, Hgb 12.3 06/21/23

## 2023-06-30 NOTE — Assessment & Plan Note (Signed)
 No behavioral issues, needs SNF for care

## 2023-07-01 NOTE — Assessment & Plan Note (Signed)
 Runs low, takes Furosemide, Isosorbide 15mg  every day. Close monitoring.

## 2023-07-04 DIAGNOSIS — R2681 Unsteadiness on feet: Secondary | ICD-10-CM | POA: Diagnosis not present

## 2023-07-04 DIAGNOSIS — R41841 Cognitive communication deficit: Secondary | ICD-10-CM | POA: Diagnosis not present

## 2023-07-04 DIAGNOSIS — R2689 Other abnormalities of gait and mobility: Secondary | ICD-10-CM | POA: Diagnosis not present

## 2023-07-04 DIAGNOSIS — M6281 Muscle weakness (generalized): Secondary | ICD-10-CM | POA: Diagnosis not present

## 2023-07-04 DIAGNOSIS — R1312 Dysphagia, oropharyngeal phase: Secondary | ICD-10-CM | POA: Diagnosis not present

## 2023-07-06 DIAGNOSIS — R2681 Unsteadiness on feet: Secondary | ICD-10-CM | POA: Diagnosis not present

## 2023-07-06 DIAGNOSIS — M6281 Muscle weakness (generalized): Secondary | ICD-10-CM | POA: Diagnosis not present

## 2023-07-06 DIAGNOSIS — R2689 Other abnormalities of gait and mobility: Secondary | ICD-10-CM | POA: Diagnosis not present

## 2023-07-06 DIAGNOSIS — R41841 Cognitive communication deficit: Secondary | ICD-10-CM | POA: Diagnosis not present

## 2023-07-06 DIAGNOSIS — R1312 Dysphagia, oropharyngeal phase: Secondary | ICD-10-CM | POA: Diagnosis not present

## 2023-07-08 DIAGNOSIS — R1312 Dysphagia, oropharyngeal phase: Secondary | ICD-10-CM | POA: Diagnosis not present

## 2023-07-08 DIAGNOSIS — R2681 Unsteadiness on feet: Secondary | ICD-10-CM | POA: Diagnosis not present

## 2023-07-08 DIAGNOSIS — R41841 Cognitive communication deficit: Secondary | ICD-10-CM | POA: Diagnosis not present

## 2023-07-08 DIAGNOSIS — R2689 Other abnormalities of gait and mobility: Secondary | ICD-10-CM | POA: Diagnosis not present

## 2023-07-08 DIAGNOSIS — M6281 Muscle weakness (generalized): Secondary | ICD-10-CM | POA: Diagnosis not present

## 2023-07-13 DIAGNOSIS — R41841 Cognitive communication deficit: Secondary | ICD-10-CM | POA: Diagnosis not present

## 2023-07-13 DIAGNOSIS — R2689 Other abnormalities of gait and mobility: Secondary | ICD-10-CM | POA: Diagnosis not present

## 2023-07-13 DIAGNOSIS — M6281 Muscle weakness (generalized): Secondary | ICD-10-CM | POA: Diagnosis not present

## 2023-07-13 DIAGNOSIS — R1312 Dysphagia, oropharyngeal phase: Secondary | ICD-10-CM | POA: Diagnosis not present

## 2023-07-13 DIAGNOSIS — R2681 Unsteadiness on feet: Secondary | ICD-10-CM | POA: Diagnosis not present

## 2023-07-15 DIAGNOSIS — R41841 Cognitive communication deficit: Secondary | ICD-10-CM | POA: Diagnosis not present

## 2023-07-15 DIAGNOSIS — R2689 Other abnormalities of gait and mobility: Secondary | ICD-10-CM | POA: Diagnosis not present

## 2023-07-15 DIAGNOSIS — R1312 Dysphagia, oropharyngeal phase: Secondary | ICD-10-CM | POA: Diagnosis not present

## 2023-07-15 DIAGNOSIS — M6281 Muscle weakness (generalized): Secondary | ICD-10-CM | POA: Diagnosis not present

## 2023-07-15 DIAGNOSIS — R2681 Unsteadiness on feet: Secondary | ICD-10-CM | POA: Diagnosis not present

## 2023-07-18 ENCOUNTER — Non-Acute Institutional Stay: Payer: Self-pay | Admitting: Nurse Practitioner

## 2023-07-18 ENCOUNTER — Encounter: Payer: Self-pay | Admitting: Nurse Practitioner

## 2023-07-18 DIAGNOSIS — I209 Angina pectoris, unspecified: Secondary | ICD-10-CM | POA: Diagnosis not present

## 2023-07-18 DIAGNOSIS — R609 Edema, unspecified: Secondary | ICD-10-CM

## 2023-07-18 DIAGNOSIS — K219 Gastro-esophageal reflux disease without esophagitis: Secondary | ICD-10-CM

## 2023-07-18 DIAGNOSIS — E039 Hypothyroidism, unspecified: Secondary | ICD-10-CM

## 2023-07-18 DIAGNOSIS — I872 Venous insufficiency (chronic) (peripheral): Secondary | ICD-10-CM | POA: Diagnosis not present

## 2023-07-18 DIAGNOSIS — I480 Paroxysmal atrial fibrillation: Secondary | ICD-10-CM

## 2023-07-18 DIAGNOSIS — N400 Enlarged prostate without lower urinary tract symptoms: Secondary | ICD-10-CM | POA: Diagnosis not present

## 2023-07-18 NOTE — Assessment & Plan Note (Signed)
Stable, takes Pantoprazole, Hgb 12.3 06/21/23

## 2023-07-18 NOTE — Assessment & Plan Note (Signed)
stable, on Dutasteride 0.5mg qd 

## 2023-07-18 NOTE — Assessment & Plan Note (Signed)
 heart rate is in control, on Digoxin. Dig level 1.1 06/01/23 , takes Plavix

## 2023-07-18 NOTE — Assessment & Plan Note (Signed)
reported 07/16/23 heels were discolored and lateral R ankle wound bleeding. No discoloration of heels upon myself, reporting nurse, and supervisor examination today.  Apply foam dressing daily.

## 2023-07-18 NOTE — Assessment & Plan Note (Signed)
Stable, , takes Plavix, Isosorbide, prn NTG

## 2023-07-18 NOTE — Progress Notes (Signed)
Location:   [facility] Room Number: 813 Place of Service:  ALF (13) Provider: Arna Snipe Tempie Gibeault NP  Venita Sheffield, MD  Patient Care Team: Venita Sheffield, MD as PCP - General (Internal Medicine) Arminda Resides, MD as Consulting Physician (Dermatology) Captain Blucher X, NP as Nurse Practitioner (Internal Medicine)  Extended Emergency Contact Information Primary Emergency Contact: Yard,Bill Address: [address]          [place], [place] [address] [place] Home Phone: [phone] Mobile Phone: [phone] Relation: Brother Secondary Emergency Contact: Isla [place] Mobile Phone: [phone] Relation: Relative  Code Status: DNR Goals of care: Advanced Directive information    06/30/2023    2:52 PM  Advanced Directives  Does Patient Have a Medical Advance Directive? Yes  Type of Estate agent of [place];Out of facility DNR (pink MOST or yellow form)  Does patient want to make changes to medical advance directive? No - Patient declined  Copy of Healthcare Power of Attorney in Chart? Yes - validated most recent copy scanned in chart (See row information)  Pre-existing out of facility DNR order (yellow form or pink MOST form) Yellow form placed in chart (order not valid for inpatient use)     Chief Complaint  Patient presents with   Acute Visit    Wounds R ankle, left toe    HPI:  Pt is a 89 y.o. male seen today for an acute visit for reported 07/16/23 heels were discolored and lateral R ankle wound bleeding. No discoloration of heels upon myself, reporting nurse, and supervisor examination today.    ED eval 05/30/23 for fall. The patient stated he fell in the bathroom when his legs gave away, yelled help, assisted to get off the floor with assistance. Denied headache, loss of consciousness, chest pain, palpitation, or SOB. Resulted right jaw area contusion.              CT head/cervical spine 06/01/23  showed  1. No acute intracranial abnormality. 2. 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 3. No acute fracture or static subluxation of the cervical spine.             Venous dermatitis, improved BLE erythema, fever, thick scaly skin BLE, sensitive to touch, s/p Dermatology eval, Aveeno moisturizer, open areas R+L shins healed             Edema BLE, on Furosemide, chronic swelling.              The right buttock pressure ulcer  is healed with a scar tissue, short lived pain in the area come and goes, positional             Hx of Afib, heart rate is in control, on Digoxin. Dig level 1.1 06/01/23, takes Plavix             Hypothyroidism, stable, on Levothyroxine qd. TSH 3.96 12/24/22             Urinary frequency, stable, on Dutasteride 0.5mg  qd             CKD Bun/creat 38/1.4 06/21/23             Dysphagia, declined ST or MBSS, cough associated with swallowing.              GERD takes Pantoprazole, Hgb 12.3 06/21/23             Hyperlipidemia, takes Pravastatin, LDL 85 12/24/22  CAD/angina pectoris, takes Plavix, Isosorbide, prn NTG             Impaired memory, needs SNF care.      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   Cervical spondylosis without myelopathy 08/22/2015   Diverticulosis    Diverticulosis of colon without hemorrhage 08/22/2015   Edema 08/18/2015   Fall 07/17/2015   Gait disturbance 08/22/2015   GERD (gastroesophageal reflux disease)    occurs rarely   History of CVA (cerebrovascular accident) 07/17/2015   HLD (hyperlipidemia) 07/17/2015   Hyperlipidemia    Hypertension    Inguinal hernia    Left carotid bruit    Melanoma (HCC)    left forearm   Osteopenia    Paroxysmal atrial  fibrillation (HCC)    Physical deconditioning 08/22/2015   Retinal hemorrhage 04/24/2014   Rosacea blepharoconjunctivitis 11/20/2011   Stroke (HCC) 3/07   affected balance   Tubular adenoma of colon 2007   Weakness 07/17/2015   Past Surgical History:  Procedure Laterality Date   COLONOSCOPY  2008   EYE SURGERY Right    cataract extraction with IOL   INGUINAL HERNIA REPAIR Right 03/15/2013   Procedure: HERNIA REPAIR INGUINAL ADULT;  Surgeon: Adolph Pollack, MD;  Location: [facility];  Service: General;  Laterality: Right;   INSERTION OF MESH Right 03/15/2013   Procedure: INSERTION OF MESH;  Surgeon: Adolph Pollack, MD;  Location: [facility];  Service: General;  Laterality: Right;   left forearm melanoma     PROSTATE BIOPSY     SKIN SURGERY  08/04/2020   TONSILLECTOMY  1933    Allergies  Allergen Reactions   Aspirin    Egg-Derived Products Swelling    throat    Allergies as of 07/18/2023       Reactions   Aspirin    Egg-derived Products Swelling   throat        Medication List        Accurate as of July 18, 2023  3:40 PM. If you have any questions, ask your nurse or doctor.          ammonium lactate 12 % lotion Commonly known as: LAC-HYDRIN Apply 1 application. topically at bedtime.   cholecalciferol 10 MCG (400 UNIT) Tabs tablet Commonly known as: VITAMIN D3 Take 400 Units by mouth 2 (two) times daily.   clopidogrel 75 MG tablet Commonly known as: PLAVIX Take 75 mg by mouth daily. Start PLAVIX from 07/24/15 if no further hematuria is reported by patient and HB > 12.   digoxin 0.125 MG tablet Commonly known as: LANOXIN Take 0.125 mg by mouth daily.   dutasteride 0.5 MG capsule Commonly known as: AVODART Take 0.5 mg by mouth daily.   furosemide 40 MG tablet Commonly known as: LASIX Take 40 mg by mouth daily.   isosorbide mononitrate 30 MG 24 hr tablet Commonly known as: IMDUR Take 0.5 tablets (15 mg total) by mouth daily.   levothyroxine 50 MCG  tablet Commonly known as: SYNTHROID Take 50 mcg by mouth daily before breakfast.   nitroGLYCERIN 0.4 MG SL tablet Commonly known as: NITROSTAT Place 1 tablet under the tongue as needed.   nystatin powder Commonly known as: MYCOSTATIN/NYSTOP Apply 1 Application topically 3 (  three) times daily.   pantoprazole 40 MG tablet Commonly known as: PROTONIX Take 1 tablet (40 mg total) by mouth daily.   polyethylene glycol 17 g packet Commonly known as: MIRALAX / GLYCOLAX Take 17 g by mouth daily as needed.   potassium chloride SA 20 MEQ tablet Commonly known as: KLOR-CON M Take 20 mEq by mouth daily.   pravastatin 20 MG tablet Commonly known as: PRAVACHOL Take 1 tablet by mouth at bedtime.   sodium fluoride 1.1 % Crea dental cream Commonly known as: PREVIDENT 5000 PLUS Place 1 Application onto teeth every evening.        Review of Systems  Constitutional:  Negative for activity change, appetite change and fever.  HENT:  Positive for hearing loss and trouble swallowing. Negative for congestion.        Cough while eating.   Eyes:  Negative for visual disturbance.  Respiratory:  Negative for cough, chest tightness and shortness of breath.   Cardiovascular:  Positive for leg swelling. Negative for chest pain and palpitations.  Gastrointestinal:  Negative for abdominal pain, constipation and diarrhea.  Genitourinary:  Positive for frequency. Negative for dysuria and urgency.  Musculoskeletal:  Positive for arthralgias and gait problem.  Skin:  Positive for wound.       Right buttock scar tissue from healed previous pressure ulcer. Improved thick scaly skin, erythema, warmth, swelling BLE.  Lateral R ankle open area, serous drainage, no odorous drainage. The lateral left 2nd toe scabbed over area.   Neurological:  Negative for speech difficulty, weakness and headaches.  Psychiatric/Behavioral:  Negative for behavioral problems and sleep disturbance. The patient is not  nervous/anxious.     Immunization History  Administered Date(s) Administered   Moderna Covid-19 Vaccine Bivalent Booster 55yrs & up 11/11/2021   Moderna Sars-Covid-2 Vaccination 07/06/2019, 08/02/2019, 05/06/2020, 11/25/2020   PNEUMOCOCCAL CONJUGATE-20 12/20/2022   PPD Test 07/21/2015, 08/04/2015   Pfizer Covid-19 Vaccine Bivalent Booster 60yrs & up 04/29/2022   Tdap 07/16/2015   Unspecified SARS-COV-2 Vaccination 03/17/2021   Zoster Recombinant(Shingrix) 12/04/2013   Pertinent  Health Maintenance Due  Topic Date Due   INFLUENZA VACCINE  Discontinued      07/04/2020   11:15 AM 08/05/2020    2:48 PM 09/12/2020   10:19 AM 04/16/2022    2:46 PM 12/14/2022    1:05 PM  Fall Risk  Falls in the past year? 0 0 0 0 0  Was there an injury with Fall?  0  0 0  Fall Risk Category Calculator  0  0 0  Fall Risk Category (Retired)  Low  Low   (RETIRED) Patient Fall Risk Level  Low fall risk  Low fall risk   Patient at Risk for Falls Due to    No Fall Risks No Fall Risks  Fall risk Follow up    Falls evaluation completed Falls evaluation completed   Functional Status Survey:    Vitals:   07/18/23 1204  BP: 125/76  Pulse: 79  Resp: 16  Temp: 98.2 F (36.8 C)  SpO2: 97%  Weight: 137 lb 3.2 oz (62.2 kg)   Body mass index is 18.1 kg/m. Physical Exam Vitals and nursing note reviewed.  Constitutional:      Appearance: Normal appearance.  HENT:     Head: Normocephalic and atraumatic.     Mouth/Throat:     Mouth: Mucous membranes are moist.  Eyes:     Extraocular Movements: Extraocular movements intact.     Conjunctiva/sclera: Conjunctivae normal.  Pupils: Pupils are equal, round, and reactive to light.  Cardiovascular:     Rate and Rhythm: Normal rate. Rhythm irregular.     Heart sounds: No murmur heard. Pulmonary:     Effort: Pulmonary effort is normal.     Breath sounds: Rales present.     Comments: Bibasilar rales, L>R Abdominal:     Palpations: Abdomen is soft.      Tenderness: There is no abdominal tenderness.     Comments: Irregular schedule BM, but no constipation.   Musculoskeletal:     Cervical back: Normal range of motion and neck supple.     Right lower leg: Edema present.     Left lower leg: Edema present.     Comments: Trace edema BLE  Skin:    General: Skin is warm and dry.     Findings: Erythema present.     Comments: Chronic venous insufficiency skin changes.  Right buttock scar tissue from healed previous pressure ulcer.  Improved thick scaly skin, erythema, warmth, swelling BLE.  The right lateral ankle open area, serous drainage, mild erythema peri wound Scabbed over area lateral left 2nd toe  Neurological:     General: No focal deficit present.     Mental Status: He is alert and oriented to person, place, and time. Mental status is at baseline.     Gait: Gait abnormal.  Psychiatric:        Mood and Affect: Mood normal.        Behavior: Behavior normal.        Thought Content: Thought content normal.        Judgment: Judgment normal.     Labs reviewed: Recent Labs    06/01/23 1830 06/04/23 0000 06/21/23 0000  NA 137 140 139  K 4.1 4.2 4.2  CL 100 101 99  CO2 29 33* 36*  GLUCOSE 138*  --   --   BUN 37* 36* 38*  CREATININE 1.43* 1.2 1.4*  CALCIUM 9.2 9.2 8.8   Recent Labs    12/24/22 0000 06/01/23 1830 06/04/23 0000  AST 13* 20 24  ALT 7* 15 14  ALKPHOS 76 59 69  BILITOT  --  0.6  --   PROT  --  6.4*  --   ALBUMIN 3.7 3.2* 3.5   Recent Labs    12/24/22 0000 06/01/23 1830 06/04/23 0000 06/21/23 0000  WBC 5.9 6.5 4.9 3.9  NEUTROABS 2,985.00 4.4 2,847.00  --   HGB 13.5 13.0 12.8* 12.3*  HCT 41 40.5 38* 38*  MCV  --  92.5  --   --   PLT 250 242 232 223   Lab Results  Component Value Date   TSH 3.96 12/24/2022   No results found for: "HGBA1C" Lab Results  Component Value Date   CHOL 147 12/24/2022   HDL 42 12/24/2022   LDLCALC 85 12/24/2022   TRIG 100 12/24/2022   CHOLHDL 2.6 07/10/2019     Significant Diagnostic Results in last 30 days:  No results found.  Assessment/Plan: Venous stasis dermatitis of both lower extremities  reported 07/16/23 heels were discolored and lateral R ankle wound bleeding. No discoloration of heels upon myself, reporting nurse, and supervisor examination today.  Apply foam dressing daily.   Edema Trace edema BLE, on Furosemide, chronic swelling.   Paroxysmal atrial fibrillation (HCC)  heart rate is in control, on Digoxin. Dig level 1.1 06/01/23, takes Plavix  Acquired hypothyroidism stable, on Levothyroxine qd. TSH 3.96 12/24/22  BPH (  benign prostatic hyperplasia) stable, on Dutasteride 0.5mg  qd  GERD (gastroesophageal reflux disease) Stable, takes Pantoprazole, Hgb 12.3 06/21/23  Angina pectoris (HCC) Stable, , takes Plavix, Isosorbide, prn NTG    Family/ staff Communication: plan of care reviewed with the patient and charge nurse.   Labs/tests ordered:  none  Time spend 40 minutes.

## 2023-07-18 NOTE — Assessment & Plan Note (Signed)
stable, on Levothyroxine qd. TSH 3.96 12/24/22

## 2023-07-18 NOTE — Assessment & Plan Note (Signed)
Trace edema BLE, on Furosemide, chronic swelling.

## 2023-07-19 DIAGNOSIS — R2689 Other abnormalities of gait and mobility: Secondary | ICD-10-CM | POA: Diagnosis not present

## 2023-07-19 DIAGNOSIS — R41841 Cognitive communication deficit: Secondary | ICD-10-CM | POA: Diagnosis not present

## 2023-07-19 DIAGNOSIS — R1312 Dysphagia, oropharyngeal phase: Secondary | ICD-10-CM | POA: Diagnosis not present

## 2023-07-19 DIAGNOSIS — R2681 Unsteadiness on feet: Secondary | ICD-10-CM | POA: Diagnosis not present

## 2023-07-19 DIAGNOSIS — M6281 Muscle weakness (generalized): Secondary | ICD-10-CM | POA: Diagnosis not present

## 2023-07-22 ENCOUNTER — Non-Acute Institutional Stay (SKILLED_NURSING_FACILITY): Payer: Medicare Other | Admitting: Sports Medicine

## 2023-07-22 ENCOUNTER — Encounter: Payer: Self-pay | Admitting: Sports Medicine

## 2023-07-22 DIAGNOSIS — N1831 Chronic kidney disease, stage 3a: Secondary | ICD-10-CM | POA: Diagnosis not present

## 2023-07-22 DIAGNOSIS — E039 Hypothyroidism, unspecified: Secondary | ICD-10-CM

## 2023-07-22 DIAGNOSIS — I480 Paroxysmal atrial fibrillation: Secondary | ICD-10-CM

## 2023-07-22 DIAGNOSIS — L97312 Non-pressure chronic ulcer of right ankle with fat layer exposed: Secondary | ICD-10-CM | POA: Diagnosis not present

## 2023-07-22 DIAGNOSIS — Z9181 History of falling: Secondary | ICD-10-CM | POA: Diagnosis not present

## 2023-07-22 DIAGNOSIS — F0390 Unspecified dementia without behavioral disturbance: Secondary | ICD-10-CM | POA: Diagnosis not present

## 2023-07-22 DIAGNOSIS — R0989 Other specified symptoms and signs involving the circulatory and respiratory systems: Secondary | ICD-10-CM

## 2023-07-22 DIAGNOSIS — I872 Venous insufficiency (chronic) (peripheral): Secondary | ICD-10-CM

## 2023-07-22 DIAGNOSIS — N400 Enlarged prostate without lower urinary tract symptoms: Secondary | ICD-10-CM

## 2023-07-22 DIAGNOSIS — K219 Gastro-esophageal reflux disease without esophagitis: Secondary | ICD-10-CM | POA: Diagnosis not present

## 2023-07-22 NOTE — Progress Notes (Signed)
Provider Venita Sheffield MD Location:   [facility]   Place of Service:   [facility]   PCP: Venita Sheffield, MD Patient Care Team: Venita Sheffield, MD as PCP - General (Internal Medicine) Arminda Resides, MD as Consulting Physician (Dermatology) Mast, Man X, NP as Nurse Practitioner (Internal Medicine)  Extended Emergency Contact Information Primary Emergency Contact: Holzheimer,Bill Address: [address]          [place], [place] [address] [place] of [place] Home Phone: [phone] Mobile Phone: [phone] Relation: Brother Secondary Emergency Contact: Isla Pence [place] of [place] Mobile Phone: [phone] Relation: Relative  Code Status: DNR Goals of Care: Advanced Directive information    06/30/2023    2:52 PM  Advanced Directives  Does Patient Have a Medical Advance Directive? Yes  Type of Estate agent of [place];Out of facility DNR (pink MOST or yellow form)  Does patient want to make changes to medical advance directive? No - Patient declined  Copy of Healthcare Power of Attorney in Chart? Yes - validated most recent copy scanned in chart (See row information)  Pre-existing out of facility DNR order (yellow form or pink MOST form) Yellow form placed in chart (order not valid for inpatient use)      No chief complaint on file.   HPI: Patient is a 89 y.o. male seen today for admission to [place] from assisted living  Pt seen and examined in his room, he is having his lunch  As per staff pt does not eat much  Has small open wound on his sacrum, skin tears on his legs   pt has 1-2 falls in the  past 3 months Pt ambulates with wheel chair No agitation reported by the nursing staff  BIMS score  -14  He knows his name,  Can remember what he had for breakfast .   BASIC METABOLIC PANEL GLUCOSE 90 mg/dL 47-42 Final  Fasting reference interval UREA NITROGEN (BUN) 38 mg/dL 5-95 H Final CREATININE  1.36 mg/dL 6.38-7.56 H Final EGFR 48 mL/min/1.73 m2 > OR = 60 L Final BUN/CREATININE RATIO 28 (calc) 6-22 H Final SODIUM 139 mmol/L 135-146 Final POTASSIUM 4.2 mmol/L 3.5-5.3 Final CHLORIDE 99 mmol/L 98-110 Final CARBON DIOXIDE 36 mmol/L 20-32 H Final CALCIUM 8.8 mg/dL 4.3-32.9 Final  WHITE BLOOD CELL COUNT 3.9 Thousand/u L 3.8-10.8 Final RED BLOOD CELL COUNT 4.12 Million/uL 4.20-5.80 L Final HEMOGLOBIN 12.3 g/dL 51.8-84.1 L Final HEMATOCRIT 37.9 % 38.5-50.0 L Final MCV 92.0 fL 80.0-100.0 Final MCH 29.9 pg 27.0-33.0 Final MCHC 32.5 g/dL 66.0-63.0 Final For adults, a slight decrease in the calculated MCHC value (in the range of 30 to 32 g/dL) is most likely not clinically significant; however, it should be interpreted with caution in correlation with other red cell parameters and the patient's clinical condition. 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   Cervical spondylosis without myelopathy 08/22/2015   Diverticulosis    Diverticulosis of colon without hemorrhage 08/22/2015   Edema 08/18/2015   Fall 07/17/2015   Gait disturbance 08/22/2015   GERD (gastroesophageal reflux disease)    occurs rarely   History of  CVA (cerebrovascular accident) 07/17/2015   HLD (hyperlipidemia) 07/17/2015   Hyperlipidemia    Hypertension    Inguinal hernia    Left carotid bruit    Melanoma (HCC)    left forearm   Osteopenia    Paroxysmal atrial fibrillation (HCC)    Physical deconditioning 08/22/2015   Retinal hemorrhage 04/24/2014   Rosacea blepharoconjunctivitis 11/20/2011   Stroke (HCC) 3/07   affected balance   Tubular adenoma of colon 2007    Weakness 07/17/2015   Past Surgical History:  Procedure Laterality Date   COLONOSCOPY  2008   EYE SURGERY Right    cataract extraction with IOL   INGUINAL HERNIA REPAIR Right 03/15/2013   Procedure: HERNIA REPAIR INGUINAL ADULT;  Surgeon: Adolph Pollack, MD;  Location: [facility];  Service: General;  Laterality: Right;   INSERTION OF MESH Right 03/15/2013   Procedure: INSERTION OF MESH;  Surgeon: Adolph Pollack, MD;  Location: [facility];  Service: General;  Laterality: Right;   left forearm melanoma     PROSTATE BIOPSY     SKIN SURGERY  08/04/2020   TONSILLECTOMY  1933    reports that he quit smoking about 50 years ago. His smoking use included cigarettes. He has never used smokeless tobacco. He reports that he does not drink alcohol and does not use drugs. Social History   Socioeconomic History   Marital status: Divorced    Spouse name: Not on file   Number of children: Not on file   Years of education: Not on file   Highest education level: Not on file  Occupational History   Occupation: Retired Airline pilot    Comment: Sales  Tobacco Use   Smoking status: Former    Current packs/day: 0.00    Types: Cigarettes    Quit date: 11/05/1972    Years since quitting: 50.7   Smokeless tobacco: Never  Vaping Use   Vaping status: Never Used  Substance and Sexual Activity   Alcohol use: No    Comment:  3 ounces wine nightly   Drug use: No   Sexual activity: Not on file  Other Topics Concern   Not on file  Social History Narrative   Diet? Regular/normal      Do you drink/eat things with caffeine? no      Marital status?      divorced                              What year were you married? 1957      Do you live in a house, apartment, assisted living, condo, trailer, etc.? Apartment. Moved to [place] 08/21/2015      Is it one or more stories? 2      How many persons live in your home? Live alone      Do you have any pets in your home? (please list) no      Current or past profession:  Sales      Do you exercise?      "I do now."                            Type & how often? Exercise bike, 3 times weekly      Do you have a living will? no      Do you have a DNR form?    yes  If not, do you want to discuss one?      Do you have signed POA/HPOA for forms?                Social Drivers of Corporate investment banker Strain: Not on file  Food Insecurity: Not on file  Transportation Needs: Not on file  Physical Activity: Not on file  Stress: Not on file  Social Connections: Not on file  Intimate Partner Violence: Not on file    Functional Status Survey:    Family History  Problem Relation Age of Onset   Cancer Mother        lung and breast   Heart disease Father        MI   Cancer Sister        pancreatic    Health Maintenance  Topic Date Due   Zoster Vaccines- Shingrix (2 of 2) 01/29/2014   COVID-19 Vaccine (8 - 2024-25 season) 02/27/2023   Medicare Annual Wellness (AWV)  12/14/2023   DTaP/Tdap/Td (2 - Td or Tdap) 07/15/2025   Pneumonia Vaccine 13+ Years old  Completed   HPV VACCINES  Aged Out   INFLUENZA VACCINE  Discontinued    Allergies  Allergen Reactions   Aspirin    Egg-Derived Products Swelling    throat    Outpatient Encounter Medications as of 07/22/2023  Medication Sig   ammonium lactate (LAC-HYDRIN) 12 % lotion Apply 1 application. topically at bedtime.   cholecalciferol (VITAMIN D) 400 units TABS tablet Take 400 Units by mouth 2 (two) times daily.   clopidogrel (PLAVIX) 75 MG tablet Take 75 mg by mouth daily. Start PLAVIX from 07/24/15 if no further hematuria is reported by patient and HB > 12.   digoxin (LANOXIN) 0.125 MG tablet Take 0.125 mg by mouth daily.   dutasteride (AVODART) 0.5 MG capsule Take 0.5 mg by mouth daily.    furosemide (LASIX) 40 MG tablet Take 40 mg by mouth daily.   isosorbide mononitrate (IMDUR) 30 MG 24 hr tablet Take 0.5 tablets (15 mg total) by mouth daily.   levothyroxine  (SYNTHROID, LEVOTHROID) 50 MCG tablet Take 50 mcg by mouth daily before breakfast.   nitroGLYCERIN (NITROSTAT) 0.4 MG SL tablet Place 1 tablet under the tongue as needed.   nystatin (MYCOSTATIN/NYSTOP) powder Apply 1 Application topically 3 (three) times daily. (Patient not taking: Reported on 06/02/2023)   pantoprazole (PROTONIX) 40 MG tablet Take 1 tablet (40 mg total) by mouth daily.   polyethylene glycol (MIRALAX / GLYCOLAX) 17 g packet Take 17 g by mouth daily as needed.   potassium chloride SA (KLOR-CON M) 20 MEQ tablet Take 20 mEq by mouth daily.   pravastatin (PRAVACHOL) 20 MG tablet Take 1 tablet by mouth at bedtime.   sodium fluoride (PREVIDENT 5000 PLUS) 1.1 % CREA dental cream Place 1 Application onto teeth every evening.   No facility-administered encounter medications on file as of 07/22/2023.    Review of Systems  Constitutional:  Negative for fever.  Respiratory:  Negative for cough and shortness of breath.   Cardiovascular:  Negative for chest pain, palpitations and leg swelling.  Gastrointestinal:  Negative for abdominal distention, abdominal pain, blood in stool, constipation, diarrhea, nausea and vomiting.  Genitourinary:  Negative for dysuria.  Neurological:  Negative for dizziness.    There were no vitals filed for this visit. There is no height or weight on file to calculate BMI. Physical Exam Constitutional:      Appearance: Normal appearance.  HENT:     Head: Normocephalic and atraumatic.  Cardiovascular:     Rate and Rhythm: Normal rate and regular rhythm.     Pulses: Normal pulses.     Heart sounds: Normal heart sounds.  Pulmonary:     Effort: No respiratory distress.     Breath sounds: No wheezing or rales.  Abdominal:     General: Bowel sounds are normal. There is no distension.     Palpations: Abdomen is soft.     Tenderness: There is no abdominal tenderness. There is no guarding.  Musculoskeletal:     Comments: Lower extremities- no swelling  Dry  skin  Slight reddish/ purplish discoloration of bil foot  Diminished pedal pulses Pt denies pain  1.5cm shallow ulcer on Rt lateral malleolus.  Neurological:     Mental Status: He is alert. Mental status is at baseline.     Motor: No weakness.     Labs reviewed: Basic Metabolic Panel: Recent Labs    06/01/23 1830 06/04/23 0000 06/21/23 0000  NA 137 140 139  K 4.1 4.2 4.2  CL 100 101 99  CO2 29 33* 36*  GLUCOSE 138*  --   --   BUN 37* 36* 38*  CREATININE 1.43* 1.2 1.4*  CALCIUM 9.2 9.2 8.8   Liver Function Tests: Recent Labs    12/24/22 0000 06/01/23 1830 06/04/23 0000  AST 13* 20 24  ALT 7* 15 14  ALKPHOS 76 59 69  BILITOT  --  0.6  --   PROT  --  6.4*  --   ALBUMIN 3.7 3.2* 3.5   No results for input(s): "LIPASE", "AMYLASE" in the last 8760 hours. No results for input(s): "AMMONIA" in the last 8760 hours. CBC: Recent Labs    12/24/22 0000 06/01/23 1830 06/04/23 0000 06/21/23 0000  WBC 5.9 6.5 4.9 3.9  NEUTROABS 2,985.00 4.4 2,847.00  --   HGB 13.5 13.0 12.8* 12.3*  HCT 41 40.5 38* 38*  MCV  --  92.5  --   --   PLT 250 242 232 223   Cardiac Enzymes: No results for input(s): "CKTOTAL", "CKMB", "CKMBINDEX", "TROPONINI" in the last 8760 hours. BNP: Invalid input(s): "POCBNP" No results found for: "HGBA1C" Lab Results  Component Value Date   TSH 3.96 12/24/2022   No results found for: "VITAMINB12" No results found for: "FOLATE" No results found for: "IRON", "TIBC", "FERRITIN"  Imaging and Procedures obtained prior to SNF admission: CT HEAD WO CONTRAST Result Date: 06/01/2023 CLINICAL DATA:  Trauma.  Fall. EXAM: CT HEAD WITHOUT CONTRAST CT CERVICAL SPINE WITHOUT CONTRAST TECHNIQUE: Multidetector CT imaging of the head and cervical spine was performed following the standard protocol without intravenous contrast. Multiplanar CT image reconstructions of the cervical spine were also generated. RADIATION DOSE REDUCTION: This exam was performed according to  the departmental dose-optimization program which includes automated exposure control, adjustment of the mA and/or kV according to patient size and/or use of iterative reconstruction technique. COMPARISON:  None Available. FINDINGS: CT HEAD FINDINGS Brain: There is no mass, hemorrhage or extra-axial collection. There is generalized atrophy without lobar predilection. There is hypoattenuation of the periventricular white matter, most commonly indicating chronic ischemic microangiopathy. Old right parietal and left cerebellar infarcts. Vascular: Atherosclerotic calcification of the internal carotid arteries at the skull base. No abnormal hyperdensity of the major intracranial arteries or dural venous sinuses. Skull: The visualized skull base, calvarium and extracranial soft tissues are normal. Sinuses/Orbits: No fluid levels or advanced mucosal thickening of the  visualized paranasal sinuses. No mastoid or middle ear effusion. The orbits are normal. CT CERVICAL SPINE FINDINGS Alignment: Grade 1 anterolisthesis at C4-5. Facets are aligned. Occipital condyles are normally positioned. Skull base and vertebrae: No acute fracture. Soft tissues and spinal canal: No prevertebral fluid or swelling. No visible canal hematoma. Disc levels: No spinal canal stenosis. Multilevel facet arthrosis, left-greater-than-right. Upper chest: No pneumothorax, pulmonary nodule or pleural effusion. Other: Normal visualized paraspinal cervical soft tissues. IMPRESSION: 1. No acute intracranial abnormality. 2. 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 3. No acute fracture or static subluxation of the cervical spine. Electronically Signed   By: Deatra Robinson M.D.   On: 06/01/2023 20:09   CT CERVICAL SPINE WO CONTRAST Result Date: 06/01/2023 CLINICAL DATA:  Trauma.  Fall. EXAM: CT HEAD WITHOUT CONTRAST CT CERVICAL SPINE WITHOUT CONTRAST TECHNIQUE: Multidetector CT imaging of the head and cervical spine  was performed following the standard protocol without intravenous contrast. Multiplanar CT image reconstructions of the cervical spine were also generated. RADIATION DOSE REDUCTION: This exam was performed according to the departmental dose-optimization program which includes automated exposure control, adjustment of the mA and/or kV according to patient size and/or use of iterative reconstruction technique. COMPARISON:  None Available. FINDINGS: CT HEAD FINDINGS Brain: There is no mass, hemorrhage or extra-axial collection. There is generalized atrophy without lobar predilection. There is hypoattenuation of the periventricular white matter, most commonly indicating chronic ischemic microangiopathy. Old right parietal and left cerebellar infarcts. Vascular: Atherosclerotic calcification of the internal carotid arteries at the skull base. No abnormal hyperdensity of the major intracranial arteries or dural venous sinuses. Skull: The visualized skull base, calvarium and extracranial soft tissues are normal. Sinuses/Orbits: No fluid levels or advanced mucosal thickening of the visualized paranasal sinuses. No mastoid or middle ear effusion. The orbits are normal. CT CERVICAL SPINE FINDINGS Alignment: Grade 1 anterolisthesis at C4-5. Facets are aligned. Occipital condyles are normally positioned. Skull base and vertebrae: No acute fracture. Soft tissues and spinal canal: No prevertebral fluid or swelling. No visible canal hematoma. Disc levels: No spinal canal stenosis. Multilevel facet arthrosis, left-greater-than-right. Upper chest: No pneumothorax, pulmonary nodule or pleural effusion. Other: Normal visualized paraspinal cervical soft tissues. IMPRESSION: 1. No acute intracranial abnormality. 2. 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 3. No acute fracture or static subluxation of the cervical spine. Electronically Signed   By: Deatra Robinson M.D.   On: 06/01/2023 20:09     Assessment/Plan  1. Major neurocognitive disorder (HCC) (Primary) Cont with supportive care  Functional Assessment Staging Scale: 7a - Ability to speak limited to approximately a half-dozen different intelligible words or fewer in an average day or the course of an intensive interview.    2. Paroxysmal atrial fibrillation (HCC) Not on AC due to fall risk  Digoxin levels are wnl  Will check digoxin levels  Cont with plavix Rate controlled    3. Venous stasis dermatitis of both lower extremities Cont with lasix  Cont with potassium supplements   4. Gastroesophageal reflux disease without esophagitis Cont with protonix  5. Acquired hypothyroidism Cont with synthyroid    6. Benign prostatic hyperplasia, unspecified whether lower urinary tract symptoms present Cont with dutasteride   CKD stage 3a  Avoid nephrotoxic meds   Risk for falls Fall precautions  Rt lateral malleolus wound Shallow ulcer on RT lateral malleolus Will start medhoney   Diminished pedal pulse Will check with family regarding goals of care  35  min Total time spent for obtaining history,  performing a medically appropriate examination and evaluation, reviewing the tests,ordering  tests,  documenting clinical information in the electronic or other health record,  ,care coordination (not separately reported)

## 2023-07-25 DIAGNOSIS — I70203 Unspecified atherosclerosis of native arteries of extremities, bilateral legs: Secondary | ICD-10-CM | POA: Diagnosis not present

## 2023-07-26 ENCOUNTER — Encounter: Payer: Self-pay | Admitting: Nurse Practitioner

## 2023-07-26 ENCOUNTER — Non-Acute Institutional Stay (SKILLED_NURSING_FACILITY): Payer: Medicare Other | Admitting: Nurse Practitioner

## 2023-07-26 DIAGNOSIS — Z66 Do not resuscitate: Secondary | ICD-10-CM | POA: Diagnosis not present

## 2023-07-26 DIAGNOSIS — I872 Venous insufficiency (chronic) (peripheral): Secondary | ICD-10-CM

## 2023-07-26 DIAGNOSIS — E785 Hyperlipidemia, unspecified: Secondary | ICD-10-CM

## 2023-07-26 DIAGNOSIS — R2681 Unsteadiness on feet: Secondary | ICD-10-CM | POA: Diagnosis not present

## 2023-07-26 DIAGNOSIS — I209 Angina pectoris, unspecified: Secondary | ICD-10-CM

## 2023-07-26 DIAGNOSIS — N400 Enlarged prostate without lower urinary tract symptoms: Secondary | ICD-10-CM

## 2023-07-26 DIAGNOSIS — E039 Hypothyroidism, unspecified: Secondary | ICD-10-CM

## 2023-07-26 DIAGNOSIS — N1831 Chronic kidney disease, stage 3a: Secondary | ICD-10-CM | POA: Diagnosis not present

## 2023-07-26 DIAGNOSIS — I739 Peripheral vascular disease, unspecified: Secondary | ICD-10-CM | POA: Diagnosis not present

## 2023-07-26 DIAGNOSIS — R41841 Cognitive communication deficit: Secondary | ICD-10-CM | POA: Diagnosis not present

## 2023-07-26 DIAGNOSIS — M6281 Muscle weakness (generalized): Secondary | ICD-10-CM | POA: Diagnosis not present

## 2023-07-26 DIAGNOSIS — K219 Gastro-esophageal reflux disease without esophagitis: Secondary | ICD-10-CM | POA: Diagnosis not present

## 2023-07-26 DIAGNOSIS — R1312 Dysphagia, oropharyngeal phase: Secondary | ICD-10-CM | POA: Diagnosis not present

## 2023-07-26 DIAGNOSIS — I480 Paroxysmal atrial fibrillation: Secondary | ICD-10-CM | POA: Diagnosis not present

## 2023-07-26 DIAGNOSIS — R2689 Other abnormalities of gait and mobility: Secondary | ICD-10-CM | POA: Diagnosis not present

## 2023-07-26 LAB — TSH: TSH: 3.76 (ref 0.41–5.90)

## 2023-07-26 NOTE — Assessment & Plan Note (Signed)
07/25/23 ABI inability to calculate, further vascular imaging CT angiography or MR angiography, vascular surgery. The patient denied pain in BLE, declined further diagnostic testing or vascular surgery consultation, pending HPOA's agreement.    Venous dermatitis, improved BLE erythema, fever, thick scaly skin BLE, sensitive to touch, s/p Dermatology eval, Aveeno moisturizer, open areas R+L shins healed, new open are superior lateral left low leg, no s/s of infection.

## 2023-07-26 NOTE — Assessment & Plan Note (Signed)
CAD/angina pectoris, stable, takes Plavix, Isosorbide, prn NTG

## 2023-07-26 NOTE — Assessment & Plan Note (Signed)
Bun/creat 38/1.4 06/21/23

## 2023-07-26 NOTE — Progress Notes (Unsigned)
Location:   SNF [facility] Room Number: N023-A Place of Service:  SNF (31) Provider: Arna Snipe Raushanah Osmundson NP  Venita Sheffield, MD  Patient Care Team: Venita Sheffield, MD as PCP - General (Internal Medicine) Arminda Resides, MD as Consulting Physician (Dermatology) Amandalee Lacap X, NP as Nurse Practitioner (Internal Medicine)  Extended Emergency Contact Information Primary Emergency Contact: Journey,Bill Address: [address]          [place], [place] [address] [place] Home Phone: [phone] Mobile Phone: [phone] Relation: Brother Secondary Emergency Contact: Isla [place] Mobile Phone: [phone] Relation: Relative  Code Status: DNR Goals of care: Advanced Directive information    07/26/2023    4:00 PM  Advanced Directives  Does Patient Have a Medical Advance Directive? Yes  Type of Estate agent of [place];Out of facility DNR (pink MOST or yellow form)  Does patient want to make changes to medical advance directive? No - Patient declined  Copy of Healthcare Power of Attorney in Chart? Yes - validated most recent copy scanned in chart (See row information)     Chief Complaint  Patient presents with   Acute Visit    PVD    HPI:  Pt is a 89 y.o. male seen today for an acute visit for ABI: 07/25/23 ABI inability to calculate, further vascular imaging CT angiography or MR angiography, vascular surgery. The patient denied pain in BLE, declined further diagnostic testing or vascular surgery consultation   Venous dermatitis, improved BLE erythema, fever, thick scaly skin BLE, sensitive to touch, s/p Dermatology eval, Aveeno moisturizer, open areas R+L shins healed, new open are superior lateral left low leg, no s/s of infection.              Edema BLE, on Furosemide, chronic swelling.              The right buttock pressure ulcer  is healed with a scar tissue, short lived pain in the area come and goes,  positional             Hx of Afib, heart rate is in control, on Digoxin. Dig level 1.6 07/26/23, takes Plavix             Hypothyroidism, stable, on Levothyroxine qd. TSH 3.76 07/26/23             Urinary frequency, stable, on Dutasteride 0.5mg  qd             CKD Bun/creat 38/1.4 06/21/23             Dysphagia, declined ST or MBSS, cough associated with swallowing.              GERD takes Pantoprazole, Hgb 12.3 06/21/23             Hyperlipidemia, takes Pravastatin, LDL 85 12/24/22             CAD/angina pectoris, takes Plavix, Isosorbide, prn NTG             Impaired memory,  SNF for care.                 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   Cervical spondylosis without myelopathy 08/22/2015  Diverticulosis    Diverticulosis of colon without hemorrhage 08/22/2015   Edema 08/18/2015   Fall 07/17/2015   Gait disturbance 08/22/2015   GERD (gastroesophageal reflux disease)    occurs rarely   History of CVA (cerebrovascular accident) 07/17/2015   HLD (hyperlipidemia) 07/17/2015   Hyperlipidemia    Hypertension    Inguinal hernia    Left carotid bruit    Melanoma (HCC)    left forearm   Osteopenia    Paroxysmal atrial fibrillation (HCC)    Physical deconditioning 08/22/2015   Retinal hemorrhage 04/24/2014   Rosacea blepharoconjunctivitis 11/20/2011   Stroke (HCC) 3/07   affected balance   Tubular adenoma of colon 2007   Weakness 07/17/2015   Past Surgical History:  Procedure Laterality Date   COLONOSCOPY  2008   EYE SURGERY Right    cataract extraction with IOL   INGUINAL HERNIA REPAIR Right 03/15/2013   Procedure: HERNIA REPAIR INGUINAL ADULT;  Surgeon: Adolph Pollack, MD;  Location: [facility];  Service: General;  Laterality: Right;   INSERTION OF  MESH Right 03/15/2013   Procedure: INSERTION OF MESH;  Surgeon: Adolph Pollack, MD;  Location: [facility];  Service: General;  Laterality: Right;   left forearm melanoma     PROSTATE BIOPSY     SKIN SURGERY  08/04/2020   TONSILLECTOMY  1933    Allergies  Allergen Reactions   Aspirin    Egg-Derived Products Swelling    throat    Allergies as of 07/26/2023       Reactions   Aspirin    Egg-derived Products Swelling   throat        Medication List        Accurate as of July 26, 2023 11:59 PM. If you have any questions, ask your nurse or doctor.          ammonium lactate 12 % lotion Commonly known as: LAC-HYDRIN Apply 1 application. topically at bedtime.   CERAVE EX Apply 1 Application topically as needed.   cholecalciferol 10 MCG (400 UNIT) Tabs tablet Commonly known as: VITAMIN D3 Take 400 Units by mouth 2 (two) times daily.   clopidogrel 75 MG tablet Commonly known as: PLAVIX Take 75 mg by mouth daily. Start PLAVIX from 07/24/15 if no further hematuria is reported by patient and HB > 12.   digoxin 0.125 MG tablet Commonly known as: LANOXIN Take 0.125 mg by mouth daily.   dutasteride 0.5 MG capsule Commonly known as: AVODART Take 0.5 mg by mouth daily.   furosemide 40 MG tablet Commonly known as: LASIX Take 40 mg by mouth daily.   isosorbide mononitrate 30 MG 24 hr tablet Commonly known as: IMDUR Take 0.5 tablets (15 mg total) by mouth daily.   levothyroxine 50 MCG tablet Commonly known as: SYNTHROID Take 50 mcg by mouth daily before breakfast.   nitroGLYCERIN 0.4 MG SL tablet Commonly known as: NITROSTAT Place 1 tablet under the tongue as needed.   pantoprazole 40 MG tablet Commonly known as: PROTONIX Take 1 tablet (40 mg total) by mouth daily.   polyethylene glycol 17 g packet Commonly known as: MIRALAX / GLYCOLAX Take 17 g by mouth daily as needed.   potassium chloride SA 20 MEQ tablet Commonly known as: KLOR-CON M Take 20 mEq by  mouth daily.   pravastatin 20 MG tablet Commonly known as: PRAVACHOL Take 1 tablet by mouth at bedtime.   Skin Prep Wipes Misc 1 Application by Does not apply route 2 (two) times  daily. Apply to heels   sodium fluoride 1.1 % Crea dental cream Commonly known as: PREVIDENT 5000 PLUS Place 1 Application onto teeth at bedtime.   UNABLE TO FIND Med Name: Medihoney Wound/Burn Dressing External Gel (Wound Dressings) Apply to R Lateral Malleolus Wound topically one time a day for Wound        Review of Systems  Constitutional:  Negative for activity change, appetite change and fever.  HENT:  Positive for hearing loss and trouble swallowing. Negative for congestion.        Cough while eating.   Eyes:  Negative for visual disturbance.  Respiratory:  Negative for cough, chest tightness and shortness of breath.   Cardiovascular:  Positive for leg swelling. Negative for chest pain and palpitations.  Gastrointestinal:  Negative for abdominal pain and constipation.  Genitourinary:  Positive for frequency. Negative for dysuria and urgency.  Musculoskeletal:  Positive for arthralgias and gait problem.  Skin:  Positive for wound.       Right buttock scar tissue from healed previous pressure ulcer. Improved thick scaly skin, erythema, warmth, swelling BLE.  Lateral LLE skin tear, no s/s of infection.  Neurological:  Negative for speech difficulty, weakness and headaches.  Psychiatric/Behavioral:  Negative for behavioral problems and sleep disturbance. The patient is not nervous/anxious.     Immunization History  Administered Date(s) Administered   Moderna Covid-19 Vaccine Bivalent Booster 64yrs & up 11/11/2021   Moderna Sars-Covid-2 Vaccination 07/06/2019, 08/02/2019, 05/06/2020, 11/25/2020   PNEUMOCOCCAL CONJUGATE-20 10/27/2021, 12/20/2022   PPD Test 07/21/2015, 08/04/2015   Pfizer Covid-19 Vaccine Bivalent Booster 32yrs & up 04/29/2022   Tdap 07/16/2015   Unspecified SARS-COV-2  Vaccination 03/17/2021   Zoster Recombinant(Shingrix) 12/04/2013   Pertinent  Health Maintenance Due  Topic Date Due   INFLUENZA VACCINE  Discontinued      07/04/2020   11:15 AM 08/05/2020    2:48 PM 09/12/2020   10:19 AM 04/16/2022    2:46 PM 12/14/2022    1:05 PM  Fall Risk  Falls in the past year? 0 0 0 0 0  Was there an injury with Fall?  0  0 0  Fall Risk Category Calculator  0  0 0  Fall Risk Category (Retired)  Low  Low   (RETIRED) Patient Fall Risk Level  Low fall risk  Low fall risk   Patient at Risk for Falls Due to    No Fall Risks No Fall Risks  Fall risk Follow up    Falls evaluation completed Falls evaluation completed   Functional Status Survey:    Vitals:   07/26/23 1559  BP: 103/68  Pulse: 78  Resp: 16  Temp: 97.6 F (36.4 C)  SpO2: 96%  Weight: 137 lb 3.2 oz (62.2 kg)  Height: 6\' 1"  (1.854 m)   Body mass index is 18.1 kg/m. Physical Exam Vitals and nursing note reviewed.  Constitutional:      Appearance: Normal appearance.  HENT:     Head: Normocephalic and atraumatic.     Mouth/Throat:     Mouth: Mucous membranes are moist.  Eyes:     Extraocular Movements: Extraocular movements intact.     Conjunctiva/sclera: Conjunctivae normal.     Pupils: Pupils are equal, round, and reactive to light.  Cardiovascular:     Rate and Rhythm: Normal rate. Rhythm irregular.     Heart sounds: No murmur heard. Pulmonary:     Effort: Pulmonary effort is normal.     Breath sounds: Rales present.  Comments: Bibasilar rales, L>R Abdominal:     Palpations: Abdomen is soft.     Tenderness: There is no abdominal tenderness.     Comments: Irregular schedule BM, but no constipation.   Musculoskeletal:     Cervical back: Normal range of motion and neck supple.     Right lower leg: Edema present.     Left lower leg: Edema present.     Comments: Trace edema BLE  Skin:    General: Skin is warm and dry.     Findings: Erythema present.     Comments: Chronic venous  insufficiency skin changes.  Right buttock scar tissue from healed previous pressure ulcer.  Superior lateral LLE skin tear, no s/s of infection.   Neurological:     General: No focal deficit present.     Mental Status: He is alert and oriented to person, place, and time. Mental status is at baseline.     Gait: Gait abnormal.  Psychiatric:        Mood and Affect: Mood normal.        Behavior: Behavior normal.        Thought Content: Thought content normal.        Judgment: Judgment normal.     Labs reviewed: Recent Labs    06/01/23 1830 06/04/23 0000 06/21/23 0000  NA 137 140 139  K 4.1 4.2 4.2  CL 100 101 99  CO2 29 33* 36*  GLUCOSE 138*  --   --   BUN 37* 36* 38*  CREATININE 1.43* 1.2 1.4*  CALCIUM 9.2 9.2 8.8   Recent Labs    12/24/22 0000 06/01/23 1830 06/04/23 0000  AST 13* 20 24  ALT 7* 15 14  ALKPHOS 76 59 69  BILITOT  --  0.6  --   PROT  --  6.4*  --   ALBUMIN 3.7 3.2* 3.5   Recent Labs    12/24/22 0000 06/01/23 1830 06/04/23 0000 06/21/23 0000  WBC 5.9 6.5 4.9 3.9  NEUTROABS 2,985.00 4.4 2,847.00  --   HGB 13.5 13.0 12.8* 12.3*  HCT 41 40.5 38* 38*  MCV  --  92.5  --   --   PLT 250 242 232 223   Lab Results  Component Value Date   TSH 3.76 07/26/2023   No results found for: "HGBA1C" Lab Results  Component Value Date   CHOL 147 12/24/2022   HDL 42 12/24/2022   LDLCALC 85 12/24/2022   TRIG 100 12/24/2022   CHOLHDL 2.6 07/10/2019    Significant Diagnostic Results in last 30 days:  No results found.  Assessment/Plan: PVD (peripheral vascular disease) (HCC) 07/25/23 ABI inability to calculate, further vascular imaging CT angiography or MR angiography, vascular surgery. The patient denied pain in BLE, declined further diagnostic testing or vascular surgery consultation, pending HPOA's agreement.    Venous dermatitis, improved BLE erythema, fever, thick scaly skin BLE, sensitive to touch, s/p Dermatology eval, Aveeno moisturizer, open areas  R+L shins healed, new open are superior lateral left low leg, no s/s of infection.   Venous stasis dermatitis of both lower extremities on Furosemide, chronic swelling.   Paroxysmal atrial fibrillation (HCC) heart rate is in control, on Digoxin. Dig level 1.6 07/26/23, takes Plavix  Acquired hypothyroidism  stable, on Levothyroxine qd. TSH 3.76 07/26/23  BPH (benign prostatic hyperplasia) stable, on Dutasteride 0.5mg  qd  CKD (chronic kidney disease) stage 3, GFR 30-59 ml/min (HCC) Bun/creat 38/1.4 06/21/23  GERD (gastroesophageal reflux disease) Stable, takes  Pantoprazole, Hgb 12.3 06/21/23  Hyperlipidemia LDL goal <70 takes Pravastatin, LDL 85 12/24/22  Angina pectoris (HCC) CAD/angina pectoris, stable, takes Plavix, Isosorbide, prn NTG    Family/ staff Communication: plan of care reviewed with the patient, the patient's HPOA, and charge nurse.   Labs/tests ordered:  none  Time spend 30 minutes.

## 2023-07-26 NOTE — Assessment & Plan Note (Signed)
Stable, takes Pantoprazole, Hgb 12.3 06/21/23

## 2023-07-26 NOTE — Assessment & Plan Note (Signed)
stable, on Dutasteride 0.5mg  qd

## 2023-07-26 NOTE — Assessment & Plan Note (Signed)
takes Pravastatin, LDL 85 12/24/22

## 2023-07-26 NOTE — Assessment & Plan Note (Signed)
on Furosemide, chronic swelling.

## 2023-07-26 NOTE — Assessment & Plan Note (Signed)
stable, on Levothyroxine qd. TSH 3.96 12/24/22

## 2023-07-26 NOTE — Assessment & Plan Note (Signed)
heart rate is in control, on Digoxin. Dig level 1.6 07/26/23, takes Plavix

## 2023-07-27 ENCOUNTER — Encounter: Payer: Self-pay | Admitting: Nurse Practitioner

## 2023-07-27 DIAGNOSIS — R1312 Dysphagia, oropharyngeal phase: Secondary | ICD-10-CM | POA: Diagnosis not present

## 2023-07-27 DIAGNOSIS — R2689 Other abnormalities of gait and mobility: Secondary | ICD-10-CM | POA: Diagnosis not present

## 2023-07-27 DIAGNOSIS — R41841 Cognitive communication deficit: Secondary | ICD-10-CM | POA: Diagnosis not present

## 2023-07-27 DIAGNOSIS — R2681 Unsteadiness on feet: Secondary | ICD-10-CM | POA: Diagnosis not present

## 2023-07-27 DIAGNOSIS — M6281 Muscle weakness (generalized): Secondary | ICD-10-CM | POA: Diagnosis not present

## 2023-07-28 DIAGNOSIS — R2689 Other abnormalities of gait and mobility: Secondary | ICD-10-CM | POA: Diagnosis not present

## 2023-07-28 DIAGNOSIS — M6281 Muscle weakness (generalized): Secondary | ICD-10-CM | POA: Diagnosis not present

## 2023-07-28 DIAGNOSIS — R41841 Cognitive communication deficit: Secondary | ICD-10-CM | POA: Diagnosis not present

## 2023-07-28 DIAGNOSIS — R1312 Dysphagia, oropharyngeal phase: Secondary | ICD-10-CM | POA: Diagnosis not present

## 2023-07-28 DIAGNOSIS — R2681 Unsteadiness on feet: Secondary | ICD-10-CM | POA: Diagnosis not present

## 2023-08-02 DIAGNOSIS — R2681 Unsteadiness on feet: Secondary | ICD-10-CM | POA: Diagnosis not present

## 2023-08-02 DIAGNOSIS — R2689 Other abnormalities of gait and mobility: Secondary | ICD-10-CM | POA: Diagnosis not present

## 2023-08-02 DIAGNOSIS — M6281 Muscle weakness (generalized): Secondary | ICD-10-CM | POA: Diagnosis not present

## 2023-08-02 DIAGNOSIS — R41841 Cognitive communication deficit: Secondary | ICD-10-CM | POA: Diagnosis not present

## 2023-08-02 DIAGNOSIS — R1312 Dysphagia, oropharyngeal phase: Secondary | ICD-10-CM | POA: Diagnosis not present

## 2023-08-03 DIAGNOSIS — R2681 Unsteadiness on feet: Secondary | ICD-10-CM | POA: Diagnosis not present

## 2023-08-03 DIAGNOSIS — R1312 Dysphagia, oropharyngeal phase: Secondary | ICD-10-CM | POA: Diagnosis not present

## 2023-08-03 DIAGNOSIS — R41841 Cognitive communication deficit: Secondary | ICD-10-CM | POA: Diagnosis not present

## 2023-08-03 DIAGNOSIS — R2689 Other abnormalities of gait and mobility: Secondary | ICD-10-CM | POA: Diagnosis not present

## 2023-08-03 DIAGNOSIS — M6281 Muscle weakness (generalized): Secondary | ICD-10-CM | POA: Diagnosis not present

## 2023-08-04 DIAGNOSIS — R41841 Cognitive communication deficit: Secondary | ICD-10-CM | POA: Diagnosis not present

## 2023-08-04 DIAGNOSIS — R2689 Other abnormalities of gait and mobility: Secondary | ICD-10-CM | POA: Diagnosis not present

## 2023-08-04 DIAGNOSIS — M6281 Muscle weakness (generalized): Secondary | ICD-10-CM | POA: Diagnosis not present

## 2023-08-04 DIAGNOSIS — R1312 Dysphagia, oropharyngeal phase: Secondary | ICD-10-CM | POA: Diagnosis not present

## 2023-08-04 DIAGNOSIS — R2681 Unsteadiness on feet: Secondary | ICD-10-CM | POA: Diagnosis not present

## 2023-08-05 DIAGNOSIS — R2681 Unsteadiness on feet: Secondary | ICD-10-CM | POA: Diagnosis not present

## 2023-08-05 DIAGNOSIS — M6281 Muscle weakness (generalized): Secondary | ICD-10-CM | POA: Diagnosis not present

## 2023-08-05 DIAGNOSIS — R2689 Other abnormalities of gait and mobility: Secondary | ICD-10-CM | POA: Diagnosis not present

## 2023-08-05 DIAGNOSIS — R41841 Cognitive communication deficit: Secondary | ICD-10-CM | POA: Diagnosis not present

## 2023-08-05 DIAGNOSIS — R1312 Dysphagia, oropharyngeal phase: Secondary | ICD-10-CM | POA: Diagnosis not present

## 2023-08-08 DIAGNOSIS — R1312 Dysphagia, oropharyngeal phase: Secondary | ICD-10-CM | POA: Diagnosis not present

## 2023-08-08 DIAGNOSIS — R2681 Unsteadiness on feet: Secondary | ICD-10-CM | POA: Diagnosis not present

## 2023-08-08 DIAGNOSIS — R2689 Other abnormalities of gait and mobility: Secondary | ICD-10-CM | POA: Diagnosis not present

## 2023-08-08 DIAGNOSIS — M6281 Muscle weakness (generalized): Secondary | ICD-10-CM | POA: Diagnosis not present

## 2023-08-08 DIAGNOSIS — R41841 Cognitive communication deficit: Secondary | ICD-10-CM | POA: Diagnosis not present

## 2023-08-09 DIAGNOSIS — R41841 Cognitive communication deficit: Secondary | ICD-10-CM | POA: Diagnosis not present

## 2023-08-09 DIAGNOSIS — R2689 Other abnormalities of gait and mobility: Secondary | ICD-10-CM | POA: Diagnosis not present

## 2023-08-09 DIAGNOSIS — R1312 Dysphagia, oropharyngeal phase: Secondary | ICD-10-CM | POA: Diagnosis not present

## 2023-08-09 DIAGNOSIS — R2681 Unsteadiness on feet: Secondary | ICD-10-CM | POA: Diagnosis not present

## 2023-08-09 DIAGNOSIS — M6281 Muscle weakness (generalized): Secondary | ICD-10-CM | POA: Diagnosis not present

## 2023-08-10 DIAGNOSIS — R2689 Other abnormalities of gait and mobility: Secondary | ICD-10-CM | POA: Diagnosis not present

## 2023-08-10 DIAGNOSIS — M6281 Muscle weakness (generalized): Secondary | ICD-10-CM | POA: Diagnosis not present

## 2023-08-10 DIAGNOSIS — R1312 Dysphagia, oropharyngeal phase: Secondary | ICD-10-CM | POA: Diagnosis not present

## 2023-08-10 DIAGNOSIS — R2681 Unsteadiness on feet: Secondary | ICD-10-CM | POA: Diagnosis not present

## 2023-08-10 DIAGNOSIS — R41841 Cognitive communication deficit: Secondary | ICD-10-CM | POA: Diagnosis not present

## 2023-08-12 DIAGNOSIS — R2681 Unsteadiness on feet: Secondary | ICD-10-CM | POA: Diagnosis not present

## 2023-08-12 DIAGNOSIS — R2689 Other abnormalities of gait and mobility: Secondary | ICD-10-CM | POA: Diagnosis not present

## 2023-08-12 DIAGNOSIS — M6281 Muscle weakness (generalized): Secondary | ICD-10-CM | POA: Diagnosis not present

## 2023-08-12 DIAGNOSIS — R41841 Cognitive communication deficit: Secondary | ICD-10-CM | POA: Diagnosis not present

## 2023-08-12 DIAGNOSIS — R1312 Dysphagia, oropharyngeal phase: Secondary | ICD-10-CM | POA: Diagnosis not present

## 2023-08-27 DEATH — deceased
